# Patient Record
Sex: Male | Born: 1975
Health system: Southern US, Community
[De-identification: ages and names within clinical notes are randomized; demographics above are authoritative.]

## PROBLEM LIST (undated history)

## (undated) DIAGNOSIS — M1990 Unspecified osteoarthritis, unspecified site: Secondary | ICD-10-CM

## (undated) DIAGNOSIS — E7800 Pure hypercholesterolemia, unspecified: Secondary | ICD-10-CM

## (undated) DIAGNOSIS — T7840XA Allergy, unspecified, initial encounter: Secondary | ICD-10-CM

## (undated) DIAGNOSIS — C6290 Malignant neoplasm of unspecified testis, unspecified whether descended or undescended: Secondary | ICD-10-CM

## (undated) DIAGNOSIS — T8859XA Other complications of anesthesia, initial encounter: Secondary | ICD-10-CM

## (undated) DIAGNOSIS — I10 Essential (primary) hypertension: Secondary | ICD-10-CM

## (undated) DIAGNOSIS — Z9289 Personal history of other medical treatment: Secondary | ICD-10-CM

## (undated) DIAGNOSIS — E669 Obesity, unspecified: Secondary | ICD-10-CM

## (undated) DIAGNOSIS — E663 Overweight: Secondary | ICD-10-CM

## (undated) DIAGNOSIS — Z809 Family history of malignant neoplasm, unspecified: Secondary | ICD-10-CM

## (undated) DIAGNOSIS — R0789 Other chest pain: Secondary | ICD-10-CM

## (undated) DIAGNOSIS — K219 Gastro-esophageal reflux disease without esophagitis: Secondary | ICD-10-CM

## (undated) DIAGNOSIS — M94262 Chondromalacia, left knee: Secondary | ICD-10-CM

## (undated) DIAGNOSIS — Z9889 Other specified postprocedural states: Secondary | ICD-10-CM

## (undated) DIAGNOSIS — Z8249 Family history of ischemic heart disease and other diseases of the circulatory system: Secondary | ICD-10-CM

## (undated) DIAGNOSIS — R112 Nausea with vomiting, unspecified: Secondary | ICD-10-CM

## (undated) DIAGNOSIS — T4145XA Adverse effect of unspecified anesthetic, initial encounter: Secondary | ICD-10-CM

## (undated) HISTORY — DX: Overweight: E66.3

## (undated) HISTORY — DX: Essential (primary) hypertension: I10

## (undated) HISTORY — DX: Family history of malignant neoplasm, unspecified: Z80.9

## (undated) HISTORY — DX: Personal history of other medical treatment: Z92.89

## (undated) HISTORY — DX: Other chest pain: R07.89

## (undated) HISTORY — PX: APPENDECTOMY: SHX54

## (undated) HISTORY — DX: Gastro-esophageal reflux disease without esophagitis: K21.9

## (undated) HISTORY — DX: Obesity, unspecified: E66.9

## (undated) HISTORY — DX: Allergy, unspecified, initial encounter: T78.40XA

## (undated) HISTORY — PX: COLONOSCOPY: SHX174

## (undated) HISTORY — DX: Family history of ischemic heart disease and other diseases of the circulatory system: Z82.49

## (undated) HISTORY — DX: Malignant neoplasm of unspecified testis, unspecified whether descended or undescended: C62.90

---

## 1999-09-04 ENCOUNTER — Encounter: Admission: RE | Admit: 1999-09-04 | Discharge: 1999-09-04 | Payer: Self-pay | Admitting: *Deleted

## 2001-04-27 ENCOUNTER — Encounter: Payer: Self-pay | Admitting: Emergency Medicine

## 2001-04-27 ENCOUNTER — Observation Stay (HOSPITAL_COMMUNITY): Admission: EM | Admit: 2001-04-27 | Discharge: 2001-04-28 | Payer: Self-pay | Admitting: Emergency Medicine

## 2005-02-02 DIAGNOSIS — C6290 Malignant neoplasm of unspecified testis, unspecified whether descended or undescended: Secondary | ICD-10-CM

## 2005-02-02 HISTORY — DX: Malignant neoplasm of unspecified testis, unspecified whether descended or undescended: C62.90

## 2005-02-07 ENCOUNTER — Observation Stay (HOSPITAL_COMMUNITY): Admission: RE | Admit: 2005-02-07 | Discharge: 2005-02-08 | Payer: Self-pay | Admitting: Urology

## 2005-02-07 ENCOUNTER — Encounter (INDEPENDENT_AMBULATORY_CARE_PROVIDER_SITE_OTHER): Payer: Self-pay | Admitting: Specialist

## 2005-02-07 HISTORY — PX: RADICAL ORCHIECTOMY: SHX2285

## 2005-02-19 ENCOUNTER — Ambulatory Visit: Payer: Self-pay | Admitting: Hematology & Oncology

## 2005-03-05 ENCOUNTER — Ambulatory Visit: Admission: RE | Admit: 2005-03-05 | Discharge: 2005-03-05 | Payer: Self-pay | Admitting: Hematology & Oncology

## 2005-04-09 ENCOUNTER — Ambulatory Visit: Payer: Self-pay | Admitting: Hematology & Oncology

## 2005-05-06 ENCOUNTER — Ambulatory Visit (HOSPITAL_COMMUNITY): Admission: RE | Admit: 2005-05-06 | Discharge: 2005-05-06 | Payer: Self-pay | Admitting: Hematology & Oncology

## 2005-08-14 ENCOUNTER — Ambulatory Visit (HOSPITAL_COMMUNITY): Admission: RE | Admit: 2005-08-14 | Discharge: 2005-08-14 | Payer: Self-pay | Admitting: Hematology & Oncology

## 2005-08-27 ENCOUNTER — Ambulatory Visit: Payer: Self-pay | Admitting: Hematology & Oncology

## 2005-10-31 ENCOUNTER — Ambulatory Visit (HOSPITAL_COMMUNITY): Admission: RE | Admit: 2005-10-31 | Discharge: 2005-10-31 | Payer: Self-pay | Admitting: Hematology & Oncology

## 2005-11-26 ENCOUNTER — Ambulatory Visit: Payer: Self-pay | Admitting: Hematology & Oncology

## 2005-11-27 LAB — CBC WITH DIFFERENTIAL/PLATELET
BASO%: 0.4 % (ref 0.0–2.0)
Basophils Absolute: 0 10*3/uL (ref 0.0–0.1)
EOS%: 1.6 % (ref 0.0–7.0)
HGB: 15.5 g/dL (ref 13.0–17.1)
MCH: 30.4 pg (ref 28.0–33.4)
MCHC: 34.1 g/dL (ref 32.0–35.9)
RDW: 13 % (ref 11.2–14.6)
lymph#: 1.4 10*3/uL (ref 0.9–3.3)

## 2005-11-29 LAB — LACTATE DEHYDROGENASE: LDH: 151 U/L (ref 94–250)

## 2006-02-14 ENCOUNTER — Emergency Department (HOSPITAL_COMMUNITY): Admission: EM | Admit: 2006-02-14 | Discharge: 2006-02-14 | Payer: Self-pay | Admitting: Emergency Medicine

## 2006-03-20 ENCOUNTER — Ambulatory Visit: Payer: Self-pay | Admitting: Hematology & Oncology

## 2006-03-24 ENCOUNTER — Ambulatory Visit (HOSPITAL_COMMUNITY): Admission: RE | Admit: 2006-03-24 | Discharge: 2006-03-24 | Payer: Self-pay | Admitting: Hematology & Oncology

## 2006-03-26 LAB — CBC WITH DIFFERENTIAL/PLATELET
BASO%: 0.4 % (ref 0.0–2.0)
Basophils Absolute: 0 10e3/uL (ref 0.0–0.1)
EOS%: 3.3 % (ref 0.0–7.0)
Eosinophils Absolute: 0.2 10e3/uL (ref 0.0–0.5)
HCT: 48.2 % (ref 38.7–49.9)
HGB: 16.6 g/dL (ref 13.0–17.1)
LYMPH%: 26.5 % (ref 14.0–48.0)
MCH: 30.6 pg (ref 28.0–33.4)
MCHC: 34.4 g/dL (ref 32.0–35.9)
MCV: 89 fL (ref 81.6–98.0)
MONO#: 0.6 10e3/uL (ref 0.1–0.9)
MONO%: 9.7 % (ref 0.0–13.0)
NEUT#: 3.5 10e3/uL (ref 1.5–6.5)
NEUT%: 60.1 % (ref 40.0–75.0)
Platelets: 397 10e3/uL (ref 145–400)
RBC: 5.41 10e6/uL (ref 4.20–5.71)
RDW: 13 % (ref 11.2–14.6)
WBC: 5.9 10e3/uL (ref 4.0–10.0)
lymph#: 1.6 10e3/uL (ref 0.9–3.3)

## 2006-03-28 LAB — COMPREHENSIVE METABOLIC PANEL
ALT: 28 U/L (ref 0–40)
Albumin: 4.6 g/dL (ref 3.5–5.2)
Alkaline Phosphatase: 110 U/L (ref 39–117)
CO2: 25 mEq/L (ref 19–32)
Glucose, Bld: 93 mg/dL (ref 70–99)
Potassium: 4.5 mEq/L (ref 3.5–5.3)
Sodium: 141 mEq/L (ref 135–145)
Total Bilirubin: 0.6 mg/dL (ref 0.3–1.2)
Total Protein: 7.3 g/dL (ref 6.0–8.3)

## 2006-03-28 LAB — TESTOSTERONE: Testosterone: 523.82 ng/dL (ref 350–890)

## 2006-03-28 LAB — TSH: TSH: 0.831 u[IU]/mL (ref 0.350–5.500)

## 2006-05-06 ENCOUNTER — Ambulatory Visit: Payer: Self-pay | Admitting: Family Medicine

## 2006-05-26 ENCOUNTER — Ambulatory Visit: Payer: Self-pay

## 2006-05-26 ENCOUNTER — Ambulatory Visit: Payer: Self-pay | Admitting: Family Medicine

## 2006-05-28 ENCOUNTER — Ambulatory Visit: Payer: Self-pay | Admitting: Family Medicine

## 2006-05-28 ENCOUNTER — Encounter: Admission: RE | Admit: 2006-05-28 | Discharge: 2006-05-28 | Payer: Self-pay | Admitting: Family Medicine

## 2006-06-12 ENCOUNTER — Ambulatory Visit: Payer: Self-pay | Admitting: Family Medicine

## 2006-06-16 ENCOUNTER — Ambulatory Visit: Payer: Self-pay | Admitting: Cardiovascular Disease

## 2006-06-20 ENCOUNTER — Ambulatory Visit: Payer: Self-pay

## 2006-07-09 ENCOUNTER — Ambulatory Visit (HOSPITAL_COMMUNITY): Admission: RE | Admit: 2006-07-09 | Discharge: 2006-07-09 | Payer: Self-pay | Admitting: Hematology & Oncology

## 2006-07-18 ENCOUNTER — Ambulatory Visit: Payer: Self-pay | Admitting: Hematology & Oncology

## 2006-07-23 LAB — CBC WITH DIFFERENTIAL/PLATELET
BASO%: 0.4 % (ref 0.0–2.0)
Eosinophils Absolute: 0.2 10*3/uL (ref 0.0–0.5)
LYMPH%: 27.4 % (ref 14.0–48.0)
MCHC: 34.4 g/dL (ref 32.0–35.9)
MONO#: 0.6 10*3/uL (ref 0.1–0.9)
MONO%: 8.7 % (ref 0.0–13.0)
NEUT#: 4.4 10*3/uL (ref 1.5–6.5)
RBC: 5.24 10*6/uL (ref 4.20–5.71)
RDW: 13.2 % (ref 11.2–14.6)
WBC: 7.3 10*3/uL (ref 4.0–10.0)
lymph#: 2 10*3/uL (ref 0.9–3.3)

## 2006-07-25 LAB — COMPREHENSIVE METABOLIC PANEL
ALT: 36 U/L (ref 0–53)
Albumin: 4.6 g/dL (ref 3.5–5.2)
Alkaline Phosphatase: 128 U/L — ABNORMAL HIGH (ref 39–117)
CO2: 24 mEq/L (ref 19–32)
Glucose, Bld: 71 mg/dL (ref 70–99)
Potassium: 4.4 mEq/L (ref 3.5–5.3)
Sodium: 139 mEq/L (ref 135–145)
Total Bilirubin: 0.4 mg/dL (ref 0.3–1.2)
Total Protein: 7.1 g/dL (ref 6.0–8.3)

## 2006-07-25 LAB — AFP TUMOR MARKER: AFP-Tumor Marker: 1.8 ng/mL (ref 0.0–8.0)

## 2006-07-25 LAB — BETA HCG QUANT (REF LAB): Beta hCG, Tumor Marker: 0.7 m[IU]/mL (ref <5.0)

## 2006-08-06 ENCOUNTER — Ambulatory Visit: Payer: Self-pay | Admitting: Family Medicine

## 2006-09-30 ENCOUNTER — Ambulatory Visit: Payer: Self-pay | Admitting: Family Medicine

## 2006-09-30 ENCOUNTER — Encounter: Admission: RE | Admit: 2006-09-30 | Discharge: 2006-09-30 | Payer: Self-pay | Admitting: Physician Assistant

## 2006-11-12 ENCOUNTER — Ambulatory Visit (HOSPITAL_COMMUNITY): Admission: RE | Admit: 2006-11-12 | Discharge: 2006-11-12 | Payer: Self-pay | Admitting: Hematology & Oncology

## 2006-11-14 ENCOUNTER — Ambulatory Visit: Payer: Self-pay | Admitting: Hematology & Oncology

## 2006-12-04 LAB — CBC WITH DIFFERENTIAL/PLATELET
EOS%: 1.9 % (ref 0.0–7.0)
Eosinophils Absolute: 0.1 10*3/uL (ref 0.0–0.5)
LYMPH%: 27.1 % (ref 14.0–48.0)
MCH: 30.6 pg (ref 28.0–33.4)
MCV: 86.9 fL (ref 81.6–98.0)
MONO%: 7.8 % (ref 0.0–13.0)
NEUT#: 3.6 10*3/uL (ref 1.5–6.5)
Platelets: 301 10*3/uL (ref 145–400)
RBC: 5.34 10*6/uL (ref 4.20–5.71)

## 2006-12-07 LAB — COMPREHENSIVE METABOLIC PANEL
Alkaline Phosphatase: 98 U/L (ref 39–117)
BUN: 12 mg/dL (ref 6–23)
Glucose, Bld: 91 mg/dL (ref 70–99)
Sodium: 140 mEq/L (ref 135–145)
Total Bilirubin: 0.5 mg/dL (ref 0.3–1.2)
Total Protein: 6.9 g/dL (ref 6.0–8.3)

## 2006-12-07 LAB — AFP TUMOR MARKER: AFP-Tumor Marker: 1.6 ng/mL (ref 0.0–8.0)

## 2006-12-07 LAB — LIPID PANEL
Cholesterol: 202 mg/dL — ABNORMAL HIGH (ref 0–200)
Triglycerides: 124 mg/dL (ref <150)
VLDL: 25 mg/dL (ref 0–40)

## 2006-12-07 LAB — BETA HCG QUANT (REF LAB): Beta hCG, Tumor Marker: <0.5 m[IU]/mL (ref <5.0)

## 2006-12-19 ENCOUNTER — Ambulatory Visit: Payer: Self-pay | Admitting: Family Medicine

## 2007-04-02 ENCOUNTER — Ambulatory Visit: Payer: Self-pay

## 2007-05-28 ENCOUNTER — Ambulatory Visit (HOSPITAL_COMMUNITY): Admission: RE | Admit: 2007-05-28 | Discharge: 2007-05-28 | Payer: Self-pay | Admitting: Hematology & Oncology

## 2007-06-02 ENCOUNTER — Ambulatory Visit: Payer: Self-pay | Admitting: Hematology & Oncology

## 2007-06-04 LAB — CBC WITH DIFFERENTIAL/PLATELET
Basophils Absolute: 0 10*3/uL (ref 0.0–0.1)
EOS%: 1.9 % (ref 0.0–7.0)
Eosinophils Absolute: 0.1 10*3/uL (ref 0.0–0.5)
HCT: 46.3 % (ref 38.7–49.9)
HGB: 16.4 g/dL (ref 13.0–17.1)
LYMPH%: 27.6 % (ref 14.0–48.0)
MCH: 31.3 pg (ref 28.0–33.4)
MCV: 88.2 fL (ref 81.6–98.0)
MONO%: 7.8 % (ref 0.0–13.0)
NEUT#: 3.2 10*3/uL (ref 1.5–6.5)
NEUT%: 62.5 % (ref 40.0–75.0)
Platelets: 302 10*3/uL (ref 145–400)

## 2007-06-07 LAB — COMPREHENSIVE METABOLIC PANEL
AST: 18 U/L (ref 0–37)
Albumin: 4.4 g/dL (ref 3.5–5.2)
Alkaline Phosphatase: 113 U/L (ref 39–117)
BUN: 11 mg/dL (ref 6–23)
Creatinine, Ser: 1 mg/dL (ref 0.40–1.50)
Glucose, Bld: 85 mg/dL (ref 70–99)

## 2007-08-10 ENCOUNTER — Ambulatory Visit: Payer: Self-pay | Admitting: Family Medicine

## 2007-08-31 ENCOUNTER — Ambulatory Visit: Payer: Self-pay | Admitting: Family Medicine

## 2007-12-04 ENCOUNTER — Ambulatory Visit: Payer: Self-pay | Admitting: Family Medicine

## 2007-12-07 ENCOUNTER — Ambulatory Visit: Payer: Self-pay | Admitting: Family Medicine

## 2007-12-07 ENCOUNTER — Encounter: Admission: RE | Admit: 2007-12-07 | Discharge: 2007-12-07 | Payer: Self-pay | Admitting: Family Medicine

## 2007-12-17 ENCOUNTER — Ambulatory Visit (HOSPITAL_COMMUNITY): Admission: RE | Admit: 2007-12-17 | Discharge: 2007-12-17 | Payer: Self-pay | Admitting: Hematology & Oncology

## 2007-12-22 ENCOUNTER — Ambulatory Visit: Payer: Self-pay | Admitting: Hematology & Oncology

## 2008-02-17 ENCOUNTER — Ambulatory Visit: Payer: Self-pay | Admitting: Family Medicine

## 2008-02-25 ENCOUNTER — Ambulatory Visit: Payer: Self-pay | Admitting: Family Medicine

## 2008-06-17 ENCOUNTER — Ambulatory Visit (HOSPITAL_COMMUNITY): Admission: RE | Admit: 2008-06-17 | Discharge: 2008-06-17 | Payer: Self-pay | Admitting: Hematology & Oncology

## 2008-06-22 IMAGING — CR DG CHEST 1V PORT
1 series · 1 of 1 positions shown · non-contrast
Comparison: Prior scout view of a chest CT dated 10/31/05.

CLINICAL DATA: Chest pain and shortness of breath for 90 minutes.  
 PORTABLE CHEST - 1 VIEW 02/14/06:

[view not recorded]
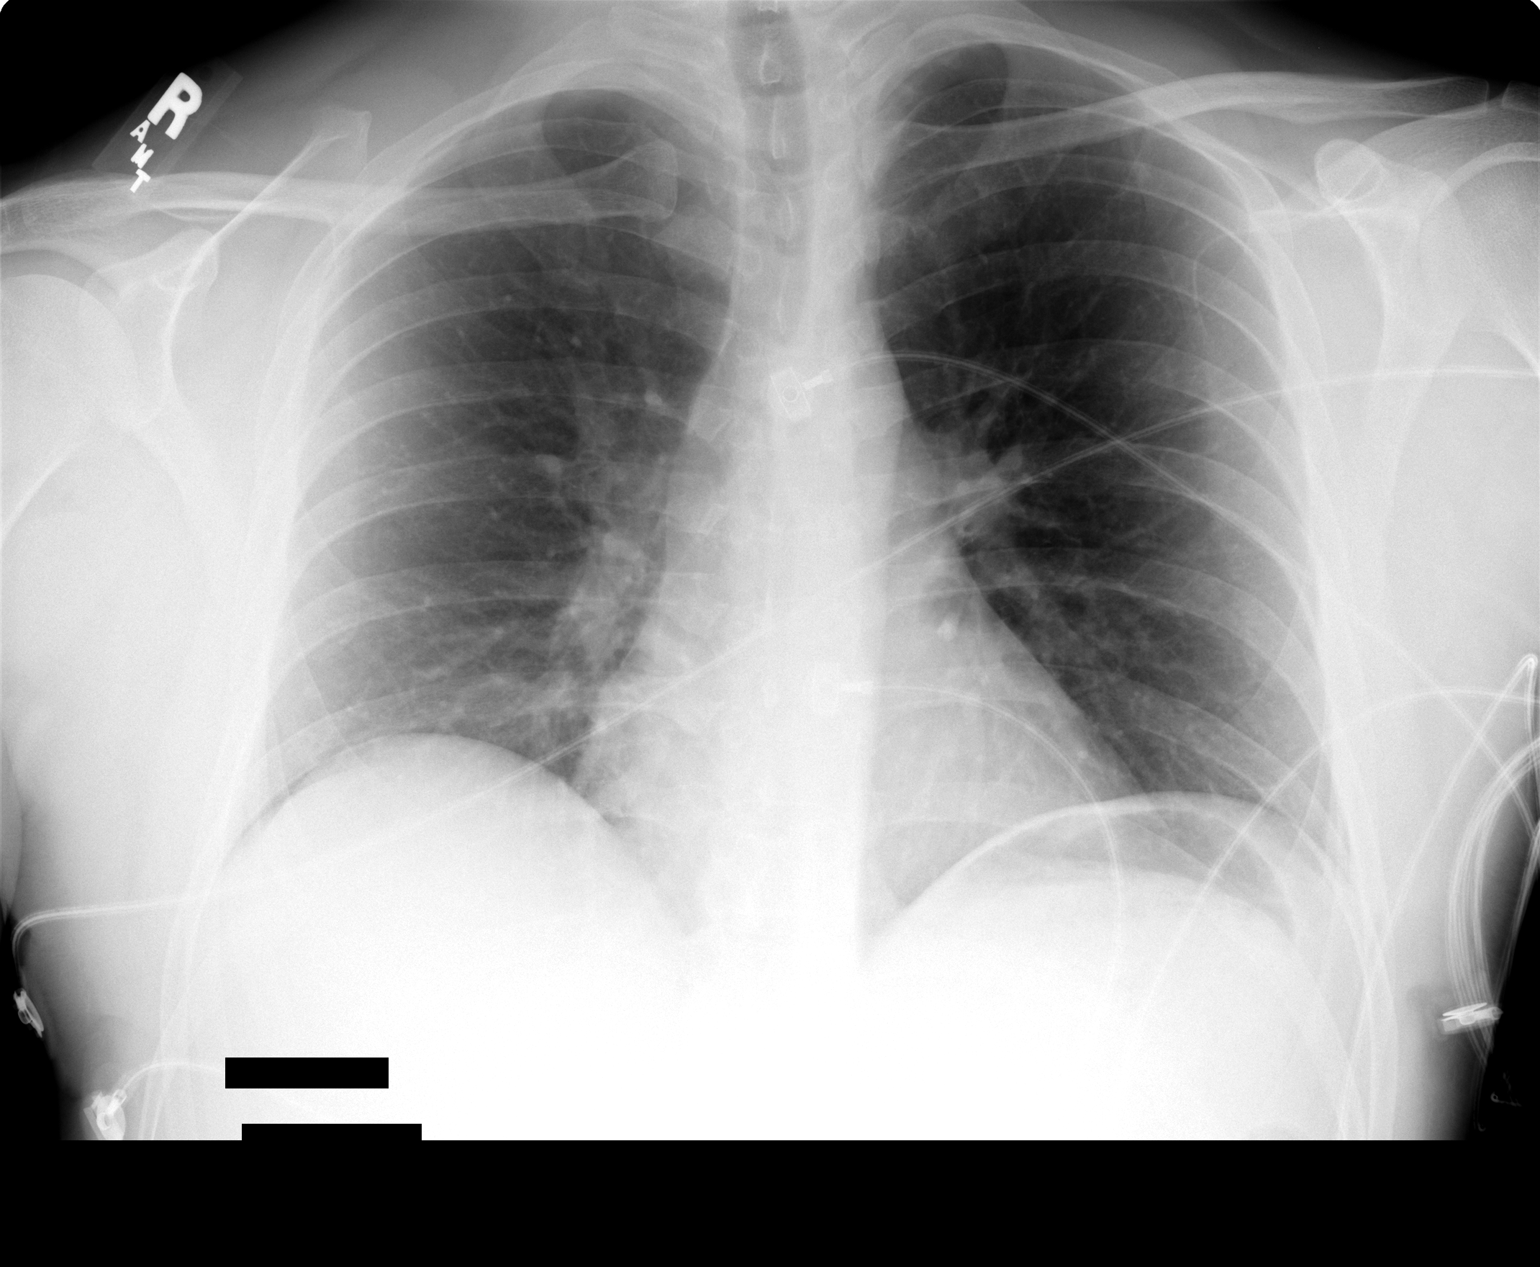

[1 of 1 positions shown; findings below may reference images not displayed]

FINDINGS: The heart size and mediastinal contours are within normal limits.  Both lungs are clear.
IMPRESSION: No acute findings.

## 2008-06-23 ENCOUNTER — Ambulatory Visit: Payer: Self-pay | Admitting: Hematology & Oncology

## 2008-06-24 LAB — CBC WITH DIFFERENTIAL (CANCER CENTER ONLY)
Eosinophils Absolute: 0.2 10*3/uL (ref 0.0–0.5)
MONO#: 0.6 10*3/uL (ref 0.1–0.9)
MONO%: 9.5 % (ref 0.0–13.0)
NEUT#: 4.3 10*3/uL (ref 1.5–6.5)
Platelets: 256 10*3/uL (ref 145–400)
RBC: 5.3 10*6/uL (ref 4.20–5.70)
WBC: 6.4 10*3/uL (ref 4.0–10.0)

## 2008-06-27 LAB — AFP TUMOR MARKER: AFP-Tumor Marker: 2.5 ng/mL (ref 0.0–8.0)

## 2008-06-27 LAB — COMPREHENSIVE METABOLIC PANEL
BUN: 10 mg/dL (ref 6–23)
CO2: 21 mEq/L (ref 19–32)
Calcium: 8.9 mg/dL (ref 8.4–10.5)
Chloride: 106 mEq/L (ref 96–112)
Creatinine, Ser: 1.03 mg/dL (ref 0.40–1.50)
Glucose, Bld: 91 mg/dL (ref 70–99)
Total Bilirubin: 0.6 mg/dL (ref 0.3–1.2)

## 2008-06-27 LAB — BETA HCG QUANT (REF LAB): Beta hCG, Tumor Marker: <0.5 m[IU]/mL (ref <5.0)

## 2008-06-27 LAB — LACTATE DEHYDROGENASE: LDH: 165 U/L (ref 94–250)

## 2008-08-01 ENCOUNTER — Encounter: Admission: RE | Admit: 2008-08-01 | Discharge: 2008-08-01 | Payer: Self-pay | Admitting: Family Medicine

## 2008-08-01 ENCOUNTER — Ambulatory Visit: Payer: Self-pay | Admitting: Family Medicine

## 2008-08-25 ENCOUNTER — Encounter: Admission: RE | Admit: 2008-08-25 | Discharge: 2008-08-25 | Payer: Self-pay | Admitting: Family Medicine

## 2008-08-25 ENCOUNTER — Ambulatory Visit: Payer: Self-pay | Admitting: Family Medicine

## 2008-11-11 ENCOUNTER — Ambulatory Visit (HOSPITAL_COMMUNITY): Admission: RE | Admit: 2008-11-11 | Discharge: 2008-11-12 | Payer: Self-pay | Admitting: Neurosurgery

## 2008-11-11 HISTORY — PX: ANTERIOR CERVICAL DECOMP/DISCECTOMY FUSION: SHX1161

## 2008-12-12 ENCOUNTER — Ambulatory Visit (HOSPITAL_BASED_OUTPATIENT_CLINIC_OR_DEPARTMENT_OTHER): Admission: RE | Admit: 2008-12-12 | Discharge: 2008-12-12 | Payer: Self-pay | Admitting: Hematology & Oncology

## 2008-12-12 ENCOUNTER — Ambulatory Visit: Payer: Self-pay | Admitting: Diagnostic Radiology

## 2008-12-15 ENCOUNTER — Ambulatory Visit: Payer: Self-pay | Admitting: Hematology & Oncology

## 2008-12-16 LAB — CBC WITH DIFFERENTIAL (CANCER CENTER ONLY)
EOS%: 5 % (ref 0.0–7.0)
MCH: 30.6 pg (ref 28.0–33.4)
MCHC: 34.3 g/dL (ref 32.0–35.9)
MONO%: 6.8 % (ref 0.0–13.0)
NEUT#: 2.7 10*3/uL (ref 1.5–6.5)
Platelets: 318 10*3/uL (ref 145–400)
RBC: 4.98 10*6/uL (ref 4.20–5.70)

## 2008-12-19 LAB — COMPREHENSIVE METABOLIC PANEL
ALT: 43 U/L (ref 0–53)
AST: 24 U/L (ref 0–37)
Albumin: 4 g/dL (ref 3.5–5.2)
Alkaline Phosphatase: 111 U/L (ref 39–117)
Glucose, Bld: 95 mg/dL (ref 70–99)
Potassium: 4.2 mEq/L (ref 3.5–5.3)
Sodium: 140 mEq/L (ref 135–145)
Total Protein: 6.9 g/dL (ref 6.0–8.3)

## 2008-12-19 LAB — BETA HCG QUANT (REF LAB): Beta hCG, Tumor Marker: <0.5 m[IU]/mL (ref <5.0)

## 2009-01-04 ENCOUNTER — Encounter: Admission: RE | Admit: 2009-01-04 | Discharge: 2009-01-04 | Payer: Self-pay | Admitting: Neurosurgery

## 2009-01-31 ENCOUNTER — Ambulatory Visit: Payer: Self-pay | Admitting: Family Medicine

## 2009-01-31 ENCOUNTER — Encounter: Admission: RE | Admit: 2009-01-31 | Discharge: 2009-01-31 | Payer: Self-pay | Admitting: Family Medicine

## 2009-06-07 ENCOUNTER — Ambulatory Visit: Payer: Self-pay | Admitting: Family Medicine

## 2009-06-15 ENCOUNTER — Ambulatory Visit: Payer: Self-pay | Admitting: Hematology & Oncology

## 2009-06-16 LAB — CBC WITH DIFFERENTIAL (CANCER CENTER ONLY)
BASO#: 0 10*3/uL (ref 0.0–0.2)
EOS%: 3.7 % (ref 0.0–7.0)
HGB: 15.4 g/dL (ref 13.0–17.1)
MCH: 30.7 pg (ref 28.0–33.4)
MCHC: 34.9 g/dL (ref 32.0–35.9)
MONO%: 7.1 % (ref 0.0–13.0)
NEUT#: 3.4 10*3/uL (ref 1.5–6.5)
NEUT%: 59.5 % (ref 40.0–80.0)
RDW: 12 % (ref 10.5–14.6)

## 2009-06-18 LAB — COMPREHENSIVE METABOLIC PANEL
AST: 25 U/L (ref 0–37)
Albumin: 4.4 g/dL (ref 3.5–5.2)
Alkaline Phosphatase: 95 U/L (ref 39–117)
BUN: 9 mg/dL (ref 6–23)
Potassium: 3.9 mEq/L (ref 3.5–5.3)
Sodium: 139 mEq/L (ref 135–145)
Total Bilirubin: 0.5 mg/dL (ref 0.3–1.2)

## 2009-06-18 LAB — BETA HCG QUANT (REF LAB): Beta hCG, Tumor Marker: 185.6 m[IU]/mL — ABNORMAL HIGH (ref <5.0)

## 2009-06-21 ENCOUNTER — Ambulatory Visit (HOSPITAL_BASED_OUTPATIENT_CLINIC_OR_DEPARTMENT_OTHER): Admission: RE | Admit: 2009-06-21 | Discharge: 2009-06-21 | Payer: Self-pay | Admitting: Hematology & Oncology

## 2009-06-21 ENCOUNTER — Ambulatory Visit: Payer: Self-pay | Admitting: Diagnostic Radiology

## 2009-06-27 ENCOUNTER — Ambulatory Visit: Payer: Self-pay | Admitting: Diagnostic Radiology

## 2009-06-27 ENCOUNTER — Ambulatory Visit (HOSPITAL_BASED_OUTPATIENT_CLINIC_OR_DEPARTMENT_OTHER): Admission: RE | Admit: 2009-06-27 | Discharge: 2009-06-27 | Payer: Self-pay | Admitting: Hematology & Oncology

## 2009-06-30 ENCOUNTER — Ambulatory Visit (HOSPITAL_COMMUNITY): Admission: RE | Admit: 2009-06-30 | Discharge: 2009-06-30 | Payer: Self-pay | Admitting: Hematology & Oncology

## 2009-06-30 LAB — BETA HCG QUANT (REF LAB): Beta hCG, Tumor Marker: <0.5 m[IU]/mL (ref <5.0)

## 2009-08-08 ENCOUNTER — Ambulatory Visit: Payer: Self-pay | Admitting: Hematology & Oncology

## 2009-09-13 ENCOUNTER — Encounter: Admission: RE | Admit: 2009-09-13 | Discharge: 2009-09-13 | Payer: Self-pay | Admitting: Family Medicine

## 2009-09-13 ENCOUNTER — Ambulatory Visit: Payer: Self-pay | Admitting: Family Medicine

## 2009-12-14 ENCOUNTER — Ambulatory Visit: Payer: Self-pay | Admitting: Hematology & Oncology

## 2009-12-15 LAB — CBC WITH DIFFERENTIAL (CANCER CENTER ONLY)
Eosinophils Absolute: 0.3 10*3/uL (ref 0.0–0.5)
LYMPH#: 1.7 10*3/uL (ref 0.9–3.3)
MCV: 90 fL (ref 82–98)
MONO#: 0.5 10*3/uL (ref 0.1–0.9)
NEUT#: 3.6 10*3/uL (ref 1.5–6.5)
Platelets: 286 10*3/uL (ref 145–400)
RBC: 5.06 10*6/uL (ref 4.20–5.70)
WBC: 6.2 10*3/uL (ref 4.0–10.0)

## 2009-12-17 LAB — COMPREHENSIVE METABOLIC PANEL
Albumin: 4.3 g/dL (ref 3.5–5.2)
CO2: 22 mEq/L (ref 19–32)
Chloride: 106 mEq/L (ref 96–112)
Glucose, Bld: 97 mg/dL (ref 70–99)
Potassium: 4.1 mEq/L (ref 3.5–5.3)
Sodium: 140 mEq/L (ref 135–145)
Total Protein: 6.9 g/dL (ref 6.0–8.3)

## 2009-12-17 LAB — LACTATE DEHYDROGENASE: LDH: 186 U/L (ref 94–250)

## 2009-12-17 LAB — AFP TUMOR MARKER: AFP-Tumor Marker: 2.4 ng/mL (ref 0.0–8.0)

## 2010-01-18 ENCOUNTER — Ambulatory Visit: Payer: Self-pay | Admitting: Hematology & Oncology

## 2010-02-22 ENCOUNTER — Ambulatory Visit: Payer: Self-pay | Admitting: Hematology & Oncology

## 2010-04-02 ENCOUNTER — Ambulatory Visit: Payer: Self-pay | Admitting: Family Medicine

## 2010-04-09 ENCOUNTER — Emergency Department (HOSPITAL_COMMUNITY): Admission: EM | Admit: 2010-04-09 | Discharge: 2010-04-09 | Payer: Self-pay | Admitting: Emergency Medicine

## 2010-04-12 ENCOUNTER — Ambulatory Visit: Payer: Self-pay | Admitting: Physician Assistant

## 2010-04-12 ENCOUNTER — Observation Stay (HOSPITAL_COMMUNITY): Admission: EM | Admit: 2010-04-12 | Discharge: 2010-04-13 | Payer: Self-pay | Admitting: Emergency Medicine

## 2010-04-12 ENCOUNTER — Ambulatory Visit: Payer: Self-pay | Admitting: Cardiology

## 2010-04-13 HISTORY — PX: CARDIAC CATHETERIZATION: SHX172

## 2010-04-19 ENCOUNTER — Telehealth (INDEPENDENT_AMBULATORY_CARE_PROVIDER_SITE_OTHER): Payer: Self-pay | Admitting: *Deleted

## 2010-04-23 ENCOUNTER — Telehealth: Payer: Self-pay | Admitting: Cardiovascular Disease

## 2010-04-30 ENCOUNTER — Telehealth: Payer: Self-pay | Admitting: Cardiovascular Disease

## 2010-05-03 ENCOUNTER — Ambulatory Visit: Payer: Self-pay | Admitting: Family Medicine

## 2010-05-09 ENCOUNTER — Ambulatory Visit (HOSPITAL_BASED_OUTPATIENT_CLINIC_OR_DEPARTMENT_OTHER): Payer: BC Managed Care – PPO | Admitting: Hematology & Oncology

## 2010-05-10 ENCOUNTER — Telehealth: Payer: Self-pay | Admitting: Cardiovascular Disease

## 2010-05-10 LAB — CBC WITH DIFFERENTIAL (CANCER CENTER ONLY)
BASO%: 0.6 % (ref 0.0–2.0)
HCT: 47 % (ref 38.7–49.9)
LYMPH%: 33.7 % (ref 14.0–48.0)
MCHC: 33.3 g/dL (ref 32.0–35.9)
MCV: 91 fL (ref 82–98)
MONO#: 0.5 10*3/uL (ref 0.1–0.9)
NEUT%: 54.8 % (ref 40.0–80.0)
RDW: 11.4 % (ref 10.5–14.6)

## 2010-05-12 LAB — COMPREHENSIVE METABOLIC PANEL
ALT: 56 U/L — ABNORMAL HIGH (ref 0–53)
CO2: 23 mEq/L (ref 19–32)
Creatinine, Ser: 0.94 mg/dL (ref 0.40–1.50)
Total Bilirubin: 0.4 mg/dL (ref 0.3–1.2)

## 2010-05-12 LAB — TESTOSTERONE: Testosterone: 279.19 ng/dL — ABNORMAL LOW (ref 350–890)

## 2010-05-12 LAB — AFP TUMOR MARKER: AFP-Tumor Marker: 2.8 ng/mL (ref 0.0–8.0)

## 2010-05-12 LAB — BETA HCG QUANT (REF LAB): Beta hCG, Tumor Marker: <0.5 m[IU]/mL (ref <5.0)

## 2010-08-26 ENCOUNTER — Encounter: Payer: Self-pay | Admitting: Hematology & Oncology

## 2010-08-26 ENCOUNTER — Encounter: Payer: Self-pay | Admitting: Family Medicine

## 2010-08-30 ENCOUNTER — Ambulatory Visit: Admit: 2010-08-30 | Payer: Self-pay | Admitting: Family Medicine

## 2010-08-30 ENCOUNTER — Ambulatory Visit
Admission: RE | Admit: 2010-08-30 | Discharge: 2010-08-30 | Payer: Self-pay | Source: Home / Self Care | Attending: Family Medicine | Admitting: Family Medicine

## 2010-09-04 NOTE — Progress Notes (Signed)
  [employer] papers recieved sent to [facility] for completion [facility]  April 19, 2010 11:44 AM

## 2010-09-04 NOTE — Progress Notes (Signed)
Summary: Calling regarding disability forms  Phone Note Call from Patient Call back at [phone]   Caller: Patient Summary of Call: Pt calling regarding disability forms Initial call taken by: Judie Grieve,  May 10, 2010 10:34 AM  Follow-up for Phone Call        Will get Dr. Clifton James to sign the forms and we will send these back to [employer]. Pt. is aware and [employer] will call pt. when forms are completed. Whitney Maeola Sarah RN  May 10, 2010 5:01 PM  Follow-up by: Whitney Maeola Sarah RN,  May 10, 2010 5:01 PM

## 2010-09-04 NOTE — Progress Notes (Signed)
Summary: re disability form  Phone Note Call from Patient   Caller: Spouse tabitha Reason for Call: Talk to Nurse Summary of Call: pt's wife calling re disability form-faxed over-wants to make sure they came in and if they are done-pls call [phone] Initial call taken by: Glynda Jaeger,  April 23, 2010 11:35 AM  Follow-up for Phone Call        Spoke with Tabitha. [employer] sending info to pt & forms should be finished within 7-10 days.  Whitney Maeola Sarah RN  April 24, 2010 9:22 AM

## 2010-09-04 NOTE — Progress Notes (Signed)
Summary: status of faxed send on 9/114  Phone Note Call from Patient Call back at Home Phone [phone] Call back at [phone]   Caller: Spouse- tabitha  Reason for Call: Talk to Nurse Summary of Call: Status of faxed send on 9/14.  Initial call taken by: Lorne Skeens,  April 30, 2010 8:35 AM  Follow-up for Phone Call        Spoke to pt. [employer] will mail the info to her for them to sign & we will call her as soon as we recieve it from [employer] for them to pick up. Whitney Maeola Sarah RN  April 30, 2010 1:41 PM

## 2010-10-18 LAB — DIFFERENTIAL
Basophils Absolute: 0 10*3/uL (ref 0.0–0.1)
Lymphocytes Relative: 30 % (ref 12–46)
Monocytes Absolute: 0.7 10*3/uL (ref 0.1–1.0)
Monocytes Relative: 9 % (ref 3–12)
Neutro Abs: 4.4 10*3/uL (ref 1.7–7.7)
Neutrophils Relative %: 57 % (ref 43–77)

## 2010-10-18 LAB — CBC
HCT: 48.1 % (ref 39.0–52.0)
Hemoglobin: 17.2 g/dL — ABNORMAL HIGH (ref 13.0–17.0)
MCHC: 34.2 g/dL (ref 30.0–36.0)
RDW: 12.3 % (ref 11.5–15.5)
RDW: 12.5 % (ref 11.5–15.5)
WBC: 7.6 10*3/uL (ref 4.0–10.5)

## 2010-10-18 LAB — CARDIAC PANEL(CRET KIN+CKTOT+MB+TROPI)
CK, MB: 1.1 ng/mL (ref 0.3–4.0)
Relative Index: 0.8 (ref 0.0–2.5)
Total CK: 120 U/L (ref 7–232)
Total CK: 130 U/L (ref 7–232)
Troponin I: 0.05 ng/mL (ref 0.00–0.06)

## 2010-10-18 LAB — BRAIN NATRIURETIC PEPTIDE: Pro B Natriuretic peptide (BNP): <30 pg/mL (ref 0.0–100.0)

## 2010-10-18 LAB — LIPID PANEL
Cholesterol: 183 mg/dL (ref 0–200)
HDL: 37 mg/dL — ABNORMAL LOW (ref >39)
Total CHOL/HDL Ratio: 4.9 RATIO

## 2010-10-18 LAB — POCT I-STAT, CHEM 8
BUN: 13 mg/dL (ref 6–23)
Creatinine, Ser: 0.9 mg/dL (ref 0.4–1.5)
Potassium: 3.9 mEq/L (ref 3.5–5.1)
Sodium: 140 mEq/L (ref 135–145)

## 2010-10-18 LAB — POCT CARDIAC MARKERS
CKMB, poc: <1 ng/mL — ABNORMAL LOW (ref 1.0–8.0)
CKMB, poc: <1 ng/mL — ABNORMAL LOW (ref 1.0–8.0)
Myoglobin, poc: 51.5 ng/mL (ref 12–200)
Troponin i, poc: <0.05 ng/mL (ref 0.00–0.09)

## 2010-10-18 LAB — COMPREHENSIVE METABOLIC PANEL
ALT: 59 U/L — ABNORMAL HIGH (ref 0–53)
AST: 36 U/L (ref 0–37)
Calcium: 8.5 mg/dL (ref 8.4–10.5)
GFR calc Af Amer: >60 mL/min (ref >60)
Glucose, Bld: 99 mg/dL (ref 70–99)
Sodium: 139 mEq/L (ref 135–145)
Total Protein: 6.3 g/dL (ref 6.0–8.3)

## 2010-10-18 LAB — CK TOTAL AND CKMB (NOT AT ARMC): Total CK: 156 U/L (ref 7–232)

## 2010-10-18 LAB — HEPARIN LEVEL (UNFRACTIONATED): Heparin Unfractionated: 0.58 IU/mL (ref 0.30–0.70)

## 2010-10-18 LAB — PROTIME-INR: INR: 0.92 (ref 0.00–1.49)

## 2010-11-08 ENCOUNTER — Encounter (HOSPITAL_BASED_OUTPATIENT_CLINIC_OR_DEPARTMENT_OTHER): Payer: BC Managed Care – PPO | Admitting: Hematology & Oncology

## 2010-11-08 DIAGNOSIS — C6290 Malignant neoplasm of unspecified testis, unspecified whether descended or undescended: Secondary | ICD-10-CM

## 2010-11-08 LAB — CBC WITH DIFFERENTIAL (CANCER CENTER ONLY)
BASO%: 0.3 % (ref 0.0–2.0)
Eosinophils Absolute: 0.2 10*3/uL (ref 0.0–0.5)
HCT: 42.3 % (ref 38.7–49.9)
LYMPH%: 30.9 % (ref 14.0–48.0)
MCV: 85 fL (ref 82–98)
MONO#: 0.6 10*3/uL (ref 0.1–0.9)
NEUT%: 56.9 % (ref 40.0–80.0)
RDW: 12.5 % (ref 11.1–15.7)
WBC: 6.3 10*3/uL (ref 4.0–10.0)

## 2010-11-10 LAB — COMPREHENSIVE METABOLIC PANEL
CO2: 24 mEq/L (ref 19–32)
Creatinine, Ser: 1.16 mg/dL (ref 0.40–1.50)
Glucose, Bld: 92 mg/dL (ref 70–99)
Total Bilirubin: 0.4 mg/dL (ref 0.3–1.2)

## 2010-11-10 LAB — AFP TUMOR MARKER: AFP-Tumor Marker: 1.5 ng/mL (ref 0.0–8.0)

## 2010-11-10 LAB — LACTATE DEHYDROGENASE: LDH: 159 U/L (ref 94–250)

## 2010-11-14 LAB — TYPE AND SCREEN: Antibody Screen: NEGATIVE

## 2010-11-14 LAB — CBC
RBC: 5.01 MIL/uL (ref 4.22–5.81)
WBC: 7.6 10*3/uL (ref 4.0–10.5)

## 2010-11-14 LAB — DIFFERENTIAL
Basophils Relative: 0 % (ref 0–1)
Lymphs Abs: 2.2 10*3/uL (ref 0.7–4.0)
Monocytes Relative: 8 % (ref 3–12)
Neutro Abs: 4.6 10*3/uL (ref 1.7–7.7)
Neutrophils Relative %: 60 % (ref 43–77)

## 2010-11-30 ENCOUNTER — Encounter (INDEPENDENT_AMBULATORY_CARE_PROVIDER_SITE_OTHER): Payer: BC Managed Care – PPO | Admitting: *Deleted

## 2010-11-30 ENCOUNTER — Ambulatory Visit: Payer: BC Managed Care – PPO | Admitting: Family Medicine

## 2010-11-30 ENCOUNTER — Ambulatory Visit (INDEPENDENT_AMBULATORY_CARE_PROVIDER_SITE_OTHER): Payer: BC Managed Care – PPO | Admitting: *Deleted

## 2010-11-30 DIAGNOSIS — Z0000 Encounter for general adult medical examination without abnormal findings: Secondary | ICD-10-CM

## 2010-11-30 DIAGNOSIS — L258 Unspecified contact dermatitis due to other agents: Secondary | ICD-10-CM

## 2010-11-30 DIAGNOSIS — E785 Hyperlipidemia, unspecified: Secondary | ICD-10-CM

## 2010-11-30 DIAGNOSIS — I10 Essential (primary) hypertension: Secondary | ICD-10-CM

## 2010-12-04 ENCOUNTER — Other Ambulatory Visit (INDEPENDENT_AMBULATORY_CARE_PROVIDER_SITE_OTHER): Payer: BC Managed Care – PPO

## 2010-12-04 DIAGNOSIS — Z0000 Encounter for general adult medical examination without abnormal findings: Secondary | ICD-10-CM

## 2010-12-18 NOTE — Op Note (Signed)
NAMECOLEY, KULIKOWSKI                 ACCOUNT NO.:  [id number]   MEDICAL RECORD NO.:  [id number]          PATIENT TYPE:  AMB   LOCATION:  SDS                          FACILITY:  [facility]   PHYSICIAN:  Henry A. Pool, M.D.    DATE OF BIRTH:  May 05, 1976   DATE OF PROCEDURE:  11/11/2008  DATE OF DISCHARGE:                               OPERATIVE REPORT   PREOPERATIVE DIAGNOSIS:  Right C5-C6 herniated nucleus pulposus with  radiculopathy.   POSTOPERATIVE DIAGNOSIS:  Right C5-C6 herniated nucleus pulposus with  radiculopathy.   PROCEDURE:  Right C5-C6 anterior cervical diskectomy and fusion with  allograft and plating.   SURGEON:  Kathaleen Maser. Pool, MD   ANESTHESIA:  General endotracheal.   INDICATIONS:  Mr. Graciela Husbands is a 35 year old male with history of neck and  right upper extremity pain, paresthesias and weakness with a right-sided  C6 radiculopathy failing conservative management.  Workup demonstrates  evidence of large right-sided C5-C6 disk herniation with compression of  the right-sided T6 nerve root.  The patient has failed conservative  management.  He presents now for C5-C6 anterior cervical diskectomy and  fusion with allograft and plating in hopes of improving his symptoms.   OPERATIVE NOTE:  The patient was taken to the operating room and placed  on the table in supine position.  After adequate level of anesthesia was  achieved, the patient was positioned supine with neck slightly extended  and held in place with halter traction.  The patient's anterior cervical  region was prepped and draped sterilely.  A #10 blade was used to make a  linear skin incision overlying the C5-6 interspace.  This was carried  down sharply to the platysma.  The platysma was then divided vertically  and dissection proceeded on the medial border of the sternocleidomastoid  muscle and carotid sheath.  Trachea and esophagus were mobilized and  tracked towards the left.  The prevertebral fascia was stripped  off the  anterior spinal column.  The longus colli muscle was elevated  bilaterally using electrocautery.  Deep self-retaining retractor was  placed.  Intraoperative fluoroscopy was used and C5-6 level was  confirmed.  Disk space was then incised with #15 blade in a rectangular  fashion.  Wide disk space clean-out was then achieved using pituitary  rongeurs, forward and backward Karlin curettes, Kerrison rongeurs, and  high-speed drill.  All elements of the disk removed down to the level of  the posterior annulus.  Microscope was then brought into the field and  used throughout the remainder of diskectomy.  The remaining aspects of  annulus and osteophytes were removed using high-speed drill down to the  level of the posterior longitudinal ligament.  The posterior  longitudinal ligament was then elevated and resected in piecemeal  fashion using Kerrison rongeurs.  The underlying thecal sac was  identified.  Wide central decompression was performed by undercutting  the bodies of C5-C6.  Decompression was then proceeded out at each  neural foramen.  Wide anterior foraminotomies were then performed along  the course of the exiting C6 nerve roots bilaterally.  At this point,  all elements of the disk herniation were completely resected.  There was  no evidence of injury to thecal sac or nerve roots.  The wound was  irrigated.  Gelfoam was placed topically for hemostasis and found to be  good.  A 6-mm Cornerstone allograft wedge was then packed into place and  recessed roughly 1 mm from the anterior cortical margin.  A 25-mm  Atlantis anterior cervical plate was then placed over the C5 and C6  levels.  This was then attached under fluoroscopic guidance using 13-mm  variable angle screws, two each at both levels.  All four screws were  given final tightening and found be solid within the bone.  The locking  screws were engaged in both levels.  Final images revealed good position  of bone  grafts, hardware at proper operative level with normal alignment  of spine.  Wound was then irrigated with antibiotic  solution.  Hemostasis was found to be good.  Wound was then closed in a  typical fashion.  Steri-Strips and sterile dressings were applied.  No  complications.  The patient tolerated the procedure well and he returned  to the recovery room postoperatively.           ______________________________  Kathaleen Maser Pool, M.D.     HAP/MEDQ  D:  11/11/2008  T:  11/13/2008  Job:  [id number]

## 2010-12-21 NOTE — Letter (Signed)
June 16, 2006    Elpidio Galea and Sharlot Gowda, M.D.  [address],  [place], [place] [address]   RE:  Scott Moss, Scott Moss  MRN:  [id number]  /  DOB:  August 18, 1975   Dear Selena Batten and Dr. Susann Givens,   Thank you for allowing me to evaluate Mr. Boomer this morning at [facility]. As you know, he is a very nice 35 year old gentleman  who developed pain and tenderness with some swelling of the veins in the  right lower leg approximately 3 weeks ago. He describes the sudden onset of  tenderness starting in the medial aspect of the ankle and traveling up to  the knee with swelling of the vein in that region and associated tenderness.  He continues to have some mild symptoms but overall his lower leg symptoms  are improved. He underwent two venous duplex studies, one of which was  negative for DVT and also negative for any thrombus seen in the saphenous  vein and tibial vein system. A second study performed October 22  demonstrated occlusive thrombus in the greater saphenous vein on the right  from the knee through the dorsum of the foot. The deep veins were easily  compressible throughout. The greater saphenous vein throughout the thigh  region was also normal.   He normally wears work boots and has changed to tennis shoes as his work  boots have been irritating that area. He does not have any claudication  symptoms or generalized swelling of his lower extremities. He has no history  of nonhealing ulcers or other leg problems.   A second complaint is that of intermittent chest discomfort in the left  chest going down the left arm. He describes this pain as something sitting  on my chest. It occurs suddenly and is not related to exertion. The pain  does not last for more than a few minutes. He does not have any associated  symptoms. 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 He has not had  syncope, light-headedness, orthopnea, PND or edema. He does have mild  chronic  dyspnea on exertion that is unchanged over time.   The patient has no other acute complaints at this time.   PAST MEDICAL HISTORY:  Pertinent for the following:  1. Testicular cancer status post surgery and chemotherapy approximately 1      year ago. The patient reports undergoing serial CT scanning with no      recurrence.  2. Hypertension recently diagnosed. He reports high blood pressure      readings in the past. He was recently started on diuretic therapy.  3. Remote tonsillectomy at age 59.   SOCIAL HISTORY:  The patient drives a tractor trailer. He is married with 2  children. He does not smoke cigarettes or use recreational drugs. He drinks  alcohol occasionally. He does not exercise regularly.   FAMILY HISTORY:  Most pertinent for his father who had coronary artery  disease at age 49. He died of cancer at age 80. His mother died of cancer at  age 34. He has 2 sisters who are alive and well at age 103 and 2.   REVIEW OF SYSTEMS:  A complete 12-point review of systems was performed.  There are no pertinent positives to report other than symptoms described  above.   PHYSICAL EXAMINATION:  GENERAL:  The patient is an alert and oriented,  healthy-appearing white male in no acute distress.  VITAL SIGNS:  Weight is 206 pounds.  Blood pressure is 147/93, heart rate 71,  respirations 12.  EYES:  Sclera anicteric, conjunctive pink.  ENT:  Moist oral mucosa with a clear oropharynx.  NECK:  Normal carotid upstrokes without bruits. Jugular venous pressure is  normal. There is no adenopathy.  LUNGS:  Clear to auscultation bilaterally.  CARDIOVASCULAR:  The apex is discreet, nondisplaced. The heart is a regular  rate and rhythm without murmurs or gallops.  ABDOMEN:  Soft, nontender, no organomegaly.  EXTREMITIES:  There is no cyanosis, clubbing or edema. Peripheral pulses are  2+ and equal throughout. The right lower extremity has some firmness in the  area of the saphenous vein from  just posterior to the medial malleolus to  the mid calf. There is no tenderness associated. There is no redness or  warmth. The left lower extremity exam is completely normal.  NEUROLOGIC:  Grossly intact with 5/5 strength in the arms and legs  bilaterally.   Electrocardiogram demonstrates normal sinus rhythm with a nonspecific T wave  abnormality and is otherwise normal.   ASSESSMENT:  1. Mr. Rosser presents with superficial thrombophlebitis. His clinical      history and lower extremity ultrasound study are consistent with this      diagnosis. Advised him to continue to wear tennis shoes as opposed to      work boots. He should elevate his right leg when possible and use warm      compresses on a p.r.n. basis. He can take nonsteroidals on a p.r.n.      basis as well. If he develops recurrent problems, we can address that      in the future with thrombectomy if necessary but he will likely respond      well to conservative therapy as it appears his symptoms have already      improved.  2. Chest pain. In the setting of the patient's strong family history of      coronary artery disease with his father being diagnosed with coronary      artery disease in his mid 48s, I would like Mr. Dunne to undergo an      exercise Myoview stress test, as he has chest pain, for further      evaluation. In the meantime, I advised him to begin an 81 mg aspirin      daily. He will need aggressive risk factor modification due to his      strong family history.  3. Hypertension. Recently started on diuretic therapy. Will leave      treatment at your discretion.  4. Lipid status. He reports having blood work recently done at your      office. I would recommend very aggressive risk factor control and      consideration of statin therapy if his LDL is greater than 100 due to      is strong family history.  I will followup with Mr. Bechard by telephone after the results of this stress  test. I would be happy  to see him back anytime on a p.r.n. basis. Thanks  again for allowing me to evaluate Mr. Borgerding. Please feel free to contact me  at any time with questions regarding his care.    Sincerely,      Veverly Fells. Excell Seltzer, MD  Electronically Signed    MDC/MedQ  DD: 06/16/2006  DT: 06/16/2006  Job #: [id number]

## 2010-12-21 NOTE — Op Note (Signed)
Scott Moss, KULZER                 ACCOUNT NO.:  [id number]   MEDICAL RECORD NO.:  [id number]          PATIENT TYPE:  AMB   LOCATION:  DAY                          [facility]ACILITY[facility]   PHYSICIAN:  Jamison Neighbor, M.D.  DATE OF BIRTH:  21-Apr-1976   DATE OF PROCEDURE:  DATE OF DISCHARGE:                                 OPERATIVE REPORT   PREOPERATIVE DIAGNOSIS:  Left testicular mass.   POSTOPERATIVE DIAGNOSIS:  Left testicular mass.   PROCEDURE:  1.  Radical orchiectomy.  2.  Left testicular implant.   SURGEON:  Jamison Neighbor, M.D.   ASSISTANT:  ___________.   ANESTHESIA:  General endotracheal with local block.   PROCEDURE:  Patient was brought to OR #11, where he was identified by his  wrist bracelet and was administered preoperative antibiotics and general  anesthesia.  He was prepped and draped in the usual sterile fashion.  Next,  a 3 cm left inguinal incision was made.  We dissected down through the  dermis, subcutaneous fat, and Scarpa's fascia with Bovie cautery.  The  external oblique was identified.  We opened up approximately 3 cm in length  the fibers of the external oblique from the external ring to approximately 3  cm cephalad.  Then using a Kitner, the ilioinguinal nerve was identified and  swept into the inferior portion of the inguinal canal.  Next, the cord was  delivered into the field.  We then delivered the testis into the field after  ligating the cord proximally with a Penrose drain.  Following this, the  gubernaculum was divided.  Excellent hemostasis was obtained in the base of  the scrotum.  Following that, the cord structure was dissected as far  cephalad as possible.  We then ligated the cord in several packets using  individual free ties and stick ties consisting of 0 silk sutures.  Following  this, the remainder of the cord structures were delivered back into the  field, and the field was copiously irrigated with antibiotic solution.  Excellent  hemostasis was insured.  We next closed the external oblique with  interrupted figure-of-eight 2-0 silk sutures, being sure to not involve the  ilioinguinal nerve in the closure.  Following this, we implanted a large  left testicular prosthesis by suturing it into the dependent portion of the  left testicle with a single 2-0 silk tie.  We made sure not to button-hole  the skin.  Next, Scarpa fascia was closed with a running 2-0 chromic, and  the skin was closed with a  running 4-0 subcuticular Monocryl.  Dressings were applied.  The patient was  reversed from his anesthesia, which he tolerated well without complications.   Dr. Logan Bores was present for the entire case and participated in all aspects of  this case.       MT/MEDQ  D:  02/07/2005  T:  02/07/2005  Job:  [id number]

## 2011-03-12 ENCOUNTER — Encounter: Payer: Self-pay | Admitting: Family Medicine

## 2011-03-15 ENCOUNTER — Ambulatory Visit: Payer: BC Managed Care – PPO | Admitting: Medical

## 2011-04-18 ENCOUNTER — Other Ambulatory Visit: Payer: Self-pay | Admitting: Hematology & Oncology

## 2011-04-18 ENCOUNTER — Encounter (HOSPITAL_BASED_OUTPATIENT_CLINIC_OR_DEPARTMENT_OTHER): Payer: BC Managed Care – PPO | Admitting: Hematology & Oncology

## 2011-04-18 DIAGNOSIS — C6290 Malignant neoplasm of unspecified testis, unspecified whether descended or undescended: Secondary | ICD-10-CM

## 2011-04-18 LAB — CBC WITH DIFFERENTIAL (CANCER CENTER ONLY)
Eosinophils Absolute: 0.2 10*3/uL (ref 0.0–0.5)
HCT: 43.3 % (ref 38.7–49.9)
LYMPH%: 25.2 % (ref 14.0–48.0)
MCV: 85 fL (ref 82–98)
MONO#: 0.9 10*3/uL (ref 0.1–0.9)
Platelets: 297 10*3/uL (ref 145–400)
RBC: 5.09 10*6/uL (ref 4.20–5.70)
WBC: 9.2 10*3/uL (ref 4.0–10.0)

## 2011-04-22 LAB — COMPREHENSIVE METABOLIC PANEL
Albumin: 4.6 g/dL (ref 3.5–5.2)
CO2: 22 mEq/L (ref 19–32)
Calcium: 9.3 mg/dL (ref 8.4–10.5)
Chloride: 106 mEq/L (ref 96–112)
Glucose, Bld: 84 mg/dL (ref 70–99)
Sodium: 139 mEq/L (ref 135–145)
Total Bilirubin: 0.4 mg/dL (ref 0.3–1.2)
Total Protein: 7 g/dL (ref 6.0–8.3)

## 2011-04-22 LAB — BETA HCG QUANT (REF LAB): Beta hCG, Tumor Marker: <0.5 m[IU]/mL (ref <5.0)

## 2011-04-22 LAB — LACTATE DEHYDROGENASE: LDH: 176 U/L (ref 94–250)

## 2011-04-22 LAB — PSA: PSA: 0.75 ng/mL (ref <=4.00)

## 2011-04-22 LAB — TESTOSTERONE, FREE, TOTAL, SHBG
Sex Hormone Binding: 19 nmol/L (ref 13–71)
Testosterone: 347.29 ng/dL (ref 250–890)

## 2011-07-11 ENCOUNTER — Telehealth: Payer: Self-pay | Admitting: *Deleted

## 2011-07-11 NOTE — Telephone Encounter (Signed)
Pt moved 12-13 to 1-2

## 2011-07-18 ENCOUNTER — Ambulatory Visit: Payer: BC Managed Care – PPO | Admitting: Hematology & Oncology

## 2011-07-18 ENCOUNTER — Other Ambulatory Visit: Payer: BC Managed Care – PPO | Admitting: Lab

## 2011-08-07 ENCOUNTER — Other Ambulatory Visit: Payer: Self-pay | Admitting: Hematology & Oncology

## 2011-08-07 ENCOUNTER — Ambulatory Visit (HOSPITAL_BASED_OUTPATIENT_CLINIC_OR_DEPARTMENT_OTHER): Payer: BC Managed Care – PPO | Admitting: Hematology & Oncology

## 2011-08-07 ENCOUNTER — Other Ambulatory Visit (HOSPITAL_BASED_OUTPATIENT_CLINIC_OR_DEPARTMENT_OTHER): Payer: BC Managed Care – PPO | Admitting: Lab

## 2011-08-07 VITALS — BP 141/87 | HR 89 | Temp 98.1°F | Ht 69.0 in | Wt 213.0 lb

## 2011-08-07 DIAGNOSIS — B999 Unspecified infectious disease: Secondary | ICD-10-CM

## 2011-08-07 DIAGNOSIS — C6290 Malignant neoplasm of unspecified testis, unspecified whether descended or undescended: Secondary | ICD-10-CM

## 2011-08-07 DIAGNOSIS — R1032 Left lower quadrant pain: Secondary | ICD-10-CM

## 2011-08-07 LAB — LACTATE DEHYDROGENASE
LDH: 160 U/L (ref 94–250)
LDH: 160 U/L (ref 94–250)

## 2011-08-07 LAB — COMPREHENSIVE METABOLIC PANEL
ALT: 69 U/L — ABNORMAL HIGH (ref 0–53)
ALT: 69 U/L — ABNORMAL HIGH (ref 0–53)
ALT: 69 U/L — ABNORMAL HIGH (ref 0–53)
AST: 35 U/L (ref 0–37)
AST: 35 U/L (ref 0–37)
Albumin: 4.6 g/dL (ref 3.5–5.2)
Albumin: 4.6 g/dL (ref 3.5–5.2)
Alkaline Phosphatase: 85 U/L (ref 39–117)
BUN: 13 mg/dL (ref 6–23)
BUN: 13 mg/dL (ref 6–23)
CO2: 22 mEq/L (ref 19–32)
CO2: 22 mEq/L (ref 19–32)
Calcium: 8.9 mg/dL (ref 8.4–10.5)
Calcium: 8.9 mg/dL (ref 8.4–10.5)
Chloride: 104 mEq/L (ref 96–112)
Chloride: 104 mEq/L (ref 96–112)
Creatinine, Ser: 0.98 mg/dL (ref 0.50–1.35)
Potassium: 3.9 mEq/L (ref 3.5–5.3)
Potassium: 3.9 mEq/L (ref 3.5–5.3)
Sodium: 138 mEq/L (ref 135–145)
Total Protein: 6.8 g/dL (ref 6.0–8.3)

## 2011-08-07 LAB — CBC WITH DIFFERENTIAL (CANCER CENTER ONLY)
BASO#: 0 10*3/uL (ref 0.0–0.2)
HCT: 43.1 % (ref 38.7–49.9)
HGB: 15.2 g/dL (ref 13.0–17.1)
LYMPH#: 1.5 10*3/uL (ref 0.9–3.3)
LYMPH%: 23.5 % (ref 14.0–48.0)
MCHC: 35.3 g/dL (ref 32.0–35.9)
MCV: 86 fL (ref 82–98)
MONO#: 0.5 10*3/uL (ref 0.1–0.9)
NEUT%: 66.6 % (ref 40.0–80.0)

## 2011-08-07 LAB — AFP TUMOR MARKER: AFP-Tumor Marker: 2.6 ng/mL (ref 0.0–8.0)

## 2011-08-07 NOTE — Progress Notes (Signed)
This office note has been dictated.

## 2011-08-07 NOTE — Progress Notes (Signed)
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  CURRENT THERAPY:  Observation.  INTERIM HISTORY:  Scott Moss comes in for followup.  We saw back in September.  He has had weight loss since then.  He has not been doing too well over the past month or so.  He has lost about 20 pounds.  He is having a dry cough.  He was on some Tamiflu thinking that this may have been a viral infection.  He is having some abdominal pain in the left lower quadrant.  He has not noticed any problem with bowels or bladder. He has had no rashes.  When we last saw him, his tumor markers were normal.  His alpha- fetoprotein was 1.6.  Beta hCG was less than 0.5.  LDH was 176.  PHYSICAL EXAM:  General:  This is a well-developed, well-nourished white gentleman in no obvious distress.  Vital Signs:  Temperature 98.1, pulse 89, respiratory rate 16, blood pressure 141/87.  Weight is 213. Head/Neck:  Exam shows a normocephalic, atraumatic skull.  There are no ocular or oral lesions.  There are no palpable cervical or supraclavicular lymph nodes.  Lungs:  Clear bilaterally.  There may be some slight crackles at the base.  Cardiac:  Regular rate and rhythm with a normal S1, S2.  No murmurs, rubs or bruits.  Abdomen:  Soft with good bowel sounds.  There is no palpable abdominal mass.  There is no fluid wave.  There is no palpable hepatosplenomegaly.  There is some tenderness to palpation in the left lower quadrant.  Extremities:  No clubbing, cyanosis or edema.  Skin:  Exam shows no rashes.  LABORATORY STUDIES:  White cell count is 6.5, hemoglobin 15.2, hematocrit 43.1, platelet count 259.  IMPRESSION:  Scott Moss is a 36 year old gentleman with a history of stage I non-seminomatous germ cell tumor of the left testicle.  He did receive 2 cycles of BEP.  I think that he is going to need to have another CT scan done.  Late recurrence for testicular germ-cell tumors is definitely well  known.  I also gave him a prescription for Omnicef (600 mg p.o. daily x5 days) to try to help with any kind of sinus or upper respiratory infection.  I do want to see Scott Moss back in another couple months just so that we can follow up with all the symptoms.    ______________________________ Josph Macho, M.D. PRE/MEDQ  D:  08/07/2011  T:  08/07/2011  Job:  [id number]  ADDENDUM:  (-) CT scans. AFP is 2.6.  b-HCG is < 0.5.  LDH is 160         Testosterone is 221.

## 2011-08-10 LAB — COMPREHENSIVE METABOLIC PANEL
ALT: 69 U/L — ABNORMAL HIGH (ref 0–53)
Albumin: 4.6 g/dL (ref 3.5–5.2)
CO2: 22 mEq/L (ref 19–32)
Chloride: 104 mEq/L (ref 96–112)
Glucose, Bld: 113 mg/dL — ABNORMAL HIGH (ref 70–99)
Potassium: 3.9 mEq/L (ref 3.5–5.3)
Sodium: 138 mEq/L (ref 135–145)
Total Protein: 6.8 g/dL (ref 6.0–8.3)

## 2011-08-10 LAB — TESTOSTERONE: Testosterone: 221.5 ng/dL — ABNORMAL LOW (ref 250–890)

## 2011-08-10 LAB — LACTATE DEHYDROGENASE: LDH: 160 U/L (ref 94–250)

## 2011-08-15 ENCOUNTER — Ambulatory Visit (INDEPENDENT_AMBULATORY_CARE_PROVIDER_SITE_OTHER)
Admission: RE | Admit: 2011-08-15 | Discharge: 2011-08-15 | Disposition: A | Discharge status: Home / Self Care | Payer: BC Managed Care – PPO | Source: Ambulatory Visit | Attending: Hematology & Oncology | Admitting: Hematology & Oncology

## 2011-08-15 ENCOUNTER — Ambulatory Visit (HOSPITAL_BASED_OUTPATIENT_CLINIC_OR_DEPARTMENT_OTHER)
Admission: RE | Admit: 2011-08-15 | Discharge: 2011-08-15 | Disposition: A | Discharge status: Home / Self Care | Payer: BC Managed Care – PPO | Source: Ambulatory Visit | Attending: Hematology & Oncology | Admitting: Hematology & Oncology

## 2011-08-15 DIAGNOSIS — C6290 Malignant neoplasm of unspecified testis, unspecified whether descended or undescended: Secondary | ICD-10-CM

## 2011-08-15 DIAGNOSIS — R0602 Shortness of breath: Secondary | ICD-10-CM

## 2011-08-15 DIAGNOSIS — Z8547 Personal history of malignant neoplasm of testis: Secondary | ICD-10-CM

## 2011-08-15 DIAGNOSIS — R911 Solitary pulmonary nodule: Secondary | ICD-10-CM

## 2011-08-15 DIAGNOSIS — J984 Other disorders of lung: Secondary | ICD-10-CM | POA: Insufficient documentation

## 2011-08-15 DIAGNOSIS — R634 Abnormal weight loss: Secondary | ICD-10-CM | POA: Insufficient documentation

## 2011-08-15 DIAGNOSIS — K7689 Other specified diseases of liver: Secondary | ICD-10-CM | POA: Insufficient documentation

## 2011-08-15 MED ORDER — IOHEXOL 300 MG/ML  SOLN: 100.0000 mL | Freq: Once | INTRAMUSCULAR | Status: AC | PRN

## 2011-09-05 ENCOUNTER — Telehealth: Payer: Self-pay | Admitting: Hematology & Oncology

## 2011-09-05 NOTE — Telephone Encounter (Signed)
Pt called and cx 09/09/11 apt. He will call back to resch

## 2011-09-09 ENCOUNTER — Ambulatory Visit: Payer: BC Managed Care – PPO | Admitting: Hematology & Oncology

## 2011-09-09 ENCOUNTER — Other Ambulatory Visit: Payer: BC Managed Care – PPO | Admitting: Lab

## 2011-10-25 ENCOUNTER — Ambulatory Visit: Payer: BC Managed Care – PPO

## 2011-10-25 ENCOUNTER — Ambulatory Visit (INDEPENDENT_AMBULATORY_CARE_PROVIDER_SITE_OTHER): Payer: BC Managed Care – PPO | Admitting: Family Medicine

## 2011-10-25 VITALS — BP 143/87 | HR 80 | Temp 98.0°F | Resp 16 | Ht 69.0 in | Wt 209.4 lb

## 2011-10-25 DIAGNOSIS — M25522 Pain in left elbow: Secondary | ICD-10-CM

## 2011-10-25 DIAGNOSIS — M25429 Effusion, unspecified elbow: Secondary | ICD-10-CM

## 2011-10-25 DIAGNOSIS — M25422 Effusion, left elbow: Secondary | ICD-10-CM

## 2011-10-25 DIAGNOSIS — M25529 Pain in unspecified elbow: Secondary | ICD-10-CM

## 2011-10-25 MED ORDER — NAPROXEN 500 MG PO TABS: 500.0000 mg | ORAL_TABLET | Freq: Two times a day (BID) | ORAL | Status: DC

## 2011-10-25 NOTE — Progress Notes (Signed)
[facility]:  Office Visit  Chief Complaint:  Chief Complaint  Patient presents with  . Elbow Pain    Left - hit on a piece of metal x 2 dys    HPI: Scott Moss is a 36 y.o. male who complains of left elbow pain 2 days ago, hit it against a metal object when pulling elbow back form extension into  flexion and heard a pop. Start having pain and swelling. + weakness, no numbness/tingling. Tried Ibuproen and Ice with some relief. No prior trauma/injury/surgery to elbow. ROM is not affected.   Past Medical History  Diagnosis Date  . Cancer 02/2005    TESTICULAR  . Family history of early CAD 02/2005  . Migraine headache 05/2006  . Hyperlipidemia   . Hypertension    Past Surgical History  Procedure Date  . Orchietomy 02/2005  . Psf 11/2008  . Cervical spine surgery 2008  . Knee surgery    History   Social History  . Marital Status: Married    Spouse Name: N/A    Number of Children: N/A  . Years of Education: N/A   Social History Main Topics  . Smoking status: Never Smoker   . Smokeless tobacco: Not on file  . Alcohol Use: No  . Drug Use: No  . Sexually Active: Yes   Other Topics Concern  . Not on file   Social History Narrative  . No narrative on file   Family History  Problem Relation Age of Onset  . Cancer Mother   . Cancer Father    No Active Allergies Prior to Admission medications   Not on File     ROS: The patient denies fevers, chills, night sweats, unintentional weight loss, chest pain, palpitations, wheezing, dyspnea on exertion, nausea, vomiting, abdominal pain, dysuria, hematuria, melena, numbness, weakness, or tingling. + left elbow pain  All other systems have been reviewed and were otherwise negative with the exception of those mentioned in the HPI and as above.    PHYSICAL EXAM: Filed Vitals:   10/25/11 1411  BP: 143/87  Pulse: 80  Temp: 98 F (36.7 C)  Resp: 16   Filed Vitals:   10/25/11 1411  Height: 5\' 9"   (1.753 m)  Weight: 209 lb 6.4 oz (94.983 kg)   Body mass index is 30.92 kg/(m^2).  General: Alert, no acute distress HEENT:  Normocephalic, atraumatic, oropharynx patent.  Cardiovascular:  Regular rate and rhythm, no rubs murmurs or gallops.  No Carotid bruits, radial pulse intact. No pedal edema.  Respiratory: Clear to auscultation bilaterally.  No wheezes, rales, or rhonchi.  No cyanosis, no use of accessory musculature GI: No organomegaly, abdomen is soft and non-tender, positive bowel sounds.  No masses. Skin: No rashes. Neurologic: Facial musculature symmetric. Psychiatric: Patient is appropriate throughout our interaction. Lymphatic: No cervical lymphadenopathy Musculoskeletal: Gait intact. LEFT ELBOW: + minimal swelling at olecranon, no warmth, no redness Normal PROM, slight pain with AROM in all directions + radial pulse  5/5, sensation intact   LABS:    EKG/XRAY:   Primary read interpreted by [facility]. Conley Rolls at [facility]. NO fx/ dislocation  ASSESSMENT/PLAN: Encounter Diagnoses  Name Primary?  . Elbow pain, left Yes  . Swelling of joint, elbow, left    Elbow pain secondary to injury resulting in sprain/strain/minimal swelling. No fx/dislocation on xray.   Rx Naproxen 500 mg BID, sxs treatment. Patient declined sling. IF worsening sxs f/u prn.    Scott Ovens PHUONG, DO 10/25/2011  2:47 PM

## 2011-12-01 ENCOUNTER — Observation Stay (HOSPITAL_COMMUNITY)
Admission: EM | Admit: 2011-12-01 | Discharge: 2011-12-02 | Disposition: A | Discharge status: Home / Self Care | Payer: BC Managed Care – PPO | Attending: Internal Medicine | Admitting: Internal Medicine

## 2011-12-01 ENCOUNTER — Encounter (HOSPITAL_COMMUNITY): Payer: Self-pay | Admitting: *Deleted

## 2011-12-01 ENCOUNTER — Emergency Department (HOSPITAL_COMMUNITY): Payer: BC Managed Care – PPO

## 2011-12-01 DIAGNOSIS — R1032 Left lower quadrant pain: Principal | ICD-10-CM | POA: Insufficient documentation

## 2011-12-01 DIAGNOSIS — R110 Nausea: Secondary | ICD-10-CM | POA: Insufficient documentation

## 2011-12-01 DIAGNOSIS — Z8547 Personal history of malignant neoplasm of testis: Secondary | ICD-10-CM | POA: Insufficient documentation

## 2011-12-01 DIAGNOSIS — J984 Other disorders of lung: Secondary | ICD-10-CM | POA: Insufficient documentation

## 2011-12-01 DIAGNOSIS — E785 Hyperlipidemia, unspecified: Secondary | ICD-10-CM | POA: Insufficient documentation

## 2011-12-01 DIAGNOSIS — I10 Essential (primary) hypertension: Secondary | ICD-10-CM | POA: Insufficient documentation

## 2011-12-01 LAB — COMPREHENSIVE METABOLIC PANEL
ALT: 48 U/L (ref 0–53)
Albumin: 4 g/dL (ref 3.5–5.2)
Calcium: 9.3 mg/dL (ref 8.4–10.5)
GFR calc Af Amer: >90 mL/min (ref >90)
Glucose, Bld: 102 mg/dL — ABNORMAL HIGH (ref 70–99)
Potassium: 3.7 mEq/L (ref 3.5–5.1)
Sodium: 138 mEq/L (ref 135–145)
Total Protein: 7.2 g/dL (ref 6.0–8.3)

## 2011-12-01 LAB — DIFFERENTIAL
Basophils Relative: 0 % (ref 0–1)
Eosinophils Absolute: 0.3 10*3/uL (ref 0.0–0.7)
Eosinophils Relative: 4 % (ref 0–5)
Lymphs Abs: 2.7 10*3/uL (ref 0.7–4.0)
Neutrophils Relative %: 53 % (ref 43–77)

## 2011-12-01 LAB — CBC
MCH: 30.4 pg (ref 26.0–34.0)
MCHC: 34.7 g/dL (ref 30.0–36.0)
MCV: 87.5 fL (ref 78.0–100.0)
Platelets: 271 10*3/uL (ref 150–400)
RBC: 5.13 MIL/uL (ref 4.22–5.81)
RDW: 12.4 % (ref 11.5–15.5)

## 2011-12-01 MED ORDER — HYDROMORPHONE HCL PF 1 MG/ML IJ SOLN
1.0000 mg | Freq: Once | INTRAMUSCULAR | Status: AC
  Administered 2011-12-01: 1 mg via INTRAVENOUS
  Filled 2011-12-01: qty 1

## 2011-12-01 MED ORDER — IOHEXOL 300 MG/ML  SOLN
100.0000 mL | Freq: Once | INTRAMUSCULAR | Status: AC | PRN
  Administered 2011-12-01: 100 mL via INTRAVENOUS

## 2011-12-01 MED ORDER — HYDROMORPHONE HCL PF 1 MG/ML IJ SOLN
1.0000 mg | Freq: Once | INTRAMUSCULAR | Status: AC
  Administered 2011-12-02: 1 mg via INTRAVENOUS
  Filled 2011-12-01: qty 1

## 2011-12-01 MED ORDER — ONDANSETRON HCL 4 MG/2ML IJ SOLN
4.0000 mg | Freq: Once | INTRAMUSCULAR | Status: AC
Start: 2011-12-01 — End: 2011-12-01
  Administered 2011-12-01: 4 mg via INTRAVENOUS
  Filled 2011-12-01: qty 2

## 2011-12-01 MED ORDER — PANTOPRAZOLE SODIUM 40 MG IV SOLR
40.0000 mg | Freq: Once | INTRAVENOUS | Status: AC
  Administered 2011-12-01: 40 mg via INTRAVENOUS
  Filled 2011-12-01: qty 40

## 2011-12-01 NOTE — ED Notes (Signed)
Pt reports llq pain starting 2 days ago, lbm today and normal, no c/o dysuria

## 2011-12-01 NOTE — ED Provider Notes (Signed)
History   Scribed for [employer]. Colon Branch, MD, the patient was seen in [facility]. The chart was scribed by Gilman Schmidt. The patients care was started at 9:37 PM.   CSN: [id number]  Arrival date & time 12/01/11  2044   First MD Initiated Contact with Patient 12/01/11 2105      Chief Complaint  Patient presents with  . Abdominal Pain    (Consider location/radiation/quality/duration/timing/severity/associated sxs/prior treatment) HPI SATURNINO LIEW is a 36 y.o. male with a history of testicular cancer who presents to the Emergency Department complaining of sharp LLQ abdominal pain onset 2 days and worsening. Also notes swelling, lower back pain (both sides) and nausea. Reports normal BM. Pt has never had similar symptoms. Wife notes that pt six months ago pt has similar symptoms lasting one day. Never had a colonoscopy. Denies any family history of abdominal complications. Denies any fever, hematuria, chills, trouble urinating, or vomiting.    PCP: none Past Medical History  Diagnosis Date  . Cancer 02/2005    TESTICULAR  . Family history of early CAD 02/2005  . Migraine headache 05/2006  . Hyperlipidemia   . Hypertension     Past Surgical History  Procedure Date  . Orchietomy 02/2005  . Psf 11/2008  . Cervical spine surgery 2008  . Knee surgery     Family History  Problem Relation Age of Onset  . Cancer Mother   . Cancer Father     History  Substance Use Topics  . Smoking status: Never Smoker   . Smokeless tobacco: Not on file  . Alcohol Use: No   Works as a Engineer, drilling   Review of Systems  Constitutional: Negative for fever and chills.  Gastrointestinal: Positive for nausea and abdominal pain. Negative for vomiting.  Genitourinary: Negative for hematuria and difficulty urinating.  Musculoskeletal: Positive for back pain.  All other systems reviewed and are negative.    Allergies  Codeine  Home Medications   Current Outpatient Rx  Name Route  Sig Dispense Refill  . FEXOFENADINE-PSEUDOEPHED ER 60-120 MG PO TB12 Oral Take 1 tablet by mouth at bedtime.    [name] NAPROXEN 500 MG PO TABS Oral Take 1 tablet (500 mg total) by mouth 2 (two) times daily with a meal. 60 tablet 0    BP 151/95  Pulse 85  Temp(Src) 98.5 F (36.9 C) (Oral)  Resp 20  Ht 5\' 10"  (1.778 m)  Wt 211 lb (95.709 kg)  BMI 30.28 kg/m2  SpO2 98%  Physical Exam  Constitutional: He appears well-developed and well-nourished.  HENT:  Head: Normocephalic and atraumatic.  Eyes: Conjunctivae are normal. Pupils are equal, round, and reactive to light.  Neck: Neck supple. No tracheal deviation present. No thyromegaly present.  Cardiovascular: Normal rate and regular rhythm.  Exam reveals no gallop and no friction rub.   No murmur heard. Pulmonary/Chest: Effort normal and breath sounds normal.  Abdominal: Soft. Bowel sounds are normal. He exhibits no distension. There is no tenderness.       Focal LLQ discomfort with guarding but no rebound   Musculoskeletal: Normal range of motion. He exhibits no edema and no tenderness.  Neurological: He is alert. Coordination normal.  Skin: Skin is warm and dry. No rash noted.  Psychiatric: He has a normal mood and affect.    ED Course  Procedures (including critical care time)  Labs Reviewed - No data to display No results found.   No diagnosis found.  DIAGNOSTIC STUDIES: Oxygen Saturation  is 09% on room air, normal by my interpretation.    LABS Results for orders placed during the hospital encounter of 12/01/11  CBC      Component Value Range   WBC 7.6  4.0 - 10.5 (K/uL)   RBC 5.13  4.22 - 5.81 (MIL/uL)   Hemoglobin 15.6  13.0 - 17.0 (g/dL)   HCT 16.1  09.6 - 04.5 (%)   MCV 87.5  78.0 - 100.0 (fL)   MCH 30.4  26.0 - 34.0 (pg)   MCHC 34.7  30.0 - 36.0 (g/dL)   RDW 40.9  81.1 - 91.4 (%)   Platelets 271  150 - 400 (K/uL)  DIFFERENTIAL      Component Value Range   Neutrophils Relative 53  43 - 77 (%)   Neutro Abs  4.0  1.7 - 7.7 (K/uL)   Lymphocytes Relative 36  12 - 46 (%)   Lymphs Abs 2.7  0.7 - 4.0 (K/uL)   Monocytes Relative 7  3 - 12 (%)   Monocytes Absolute 0.5  0.1 - 1.0 (K/uL)   Eosinophils Relative 4  0 - 5 (%)   Eosinophils Absolute 0.3  0.0 - 0.7 (K/uL)   Basophils Relative 0  0 - 1 (%)   Basophils Absolute 0.0  0.0 - 0.1 (K/uL)  COMPREHENSIVE METABOLIC PANEL      Component Value Range   Sodium 138  135 - 145 (mEq/L)   Potassium 3.7  3.5 - 5.1 (mEq/L)   Chloride 102  96 - 112 (mEq/L)   CO2 27  19 - 32 (mEq/L)   Glucose, Bld 102 (*) 70 - 99 (mg/dL)   BUN 15  6 - 23 (mg/dL)   Creatinine, Ser 7.82  0.50 - 1.35 (mg/dL)   Calcium 9.3  8.4 - 95.6 (mg/dL)   Total Protein 7.2  6.0 - 8.3 (g/dL)   Albumin 4.0  3.5 - 5.2 (g/dL)   AST 26  0 - 37 (U/L)   ALT 48  0 - 53 (U/L)   Alkaline Phosphatase 88  39 - 117 (U/L)   Total Bilirubin 0.2 (*) 0.3 - 1.2 (mg/dL)   GFR calc non Af Amer >90  >90 (mL/min)   GFR calc Af Amer >90  >90 (mL/min)  URINALYSIS, ROUTINE W REFLEX MICROSCOPIC      Component Value Range   Color, Urine YELLOW  YELLOW    APPearance CLEAR  CLEAR    Specific Gravity, Urine 1.010  1.005 - 1.030    pH 7.5  5.0 - 8.0    Glucose, UA NEGATIVE  NEGATIVE (mg/dL)   Hgb urine dipstick NEGATIVE  NEGATIVE    Bilirubin Urine NEGATIVE  NEGATIVE    Ketones, ur NEGATIVE  NEGATIVE (mg/dL)   Protein, ur NEGATIVE  NEGATIVE (mg/dL)   Urobilinogen, UA 0.2  0.0 - 1.0 (mg/dL)   Nitrite NEGATIVE  NEGATIVE    Leukocytes, UA NEGATIVE  NEGATIVE    Ct Abdomen Pelvis W Contrast  12/01/2011  *RADIOLOGY REPORT*  Clinical Data: Left lower quadrant abdominal pain  CT ABDOMEN AND PELVIS WITH CONTRAST  Technique:  Multidetector CT imaging of the abdomen and pelvis was performed following the standard protocol during bolus administration of intravenous contrast.  Contrast: OMNIPAQUE IOHEXOL 300 MG/ML  SOLN  Comparison: 08/15/2011 CT  Findings: Mild dependent atelectasis.  Unchanged tiny lung base  nodules, the largest of which is a a 4 mm subpleural nodule posteriorly within the left lower lobe.  Normal heart size.  Hepatic steatosis.  Unremarkable biliary system, spleen, pancreas, adrenal glands, kidneys.  No hydronephrosis or hydroureter.  No bowel obstruction.  No CT evidence for colitis.  Appendix not identified.  No right lower quadrant inflammation.  No free intraperitoneal air or fluid.  No lymphadenopathy.  Normal caliber vasculature.  Thin-walled bladder.  Left orchiectomy with prosthesis.  Right iliac bone island.  No acute osseous abnormality.  IMPRESSION: Tiny lung base nodules, similar to the recent comparison. Metastatic disease again not excluded. Attention on non emergent chest CT follow-up.  Hepatic steatosis.  No findings to explain the patient's acute left lower quadrant pain.  Original Report Authenticated By: Waneta Martins, M.D.    COORDINATION OF CARE: 9:37pm:  - Patient evaluated by ED physician, Protonix, Dilaudid, Zofran, CT Abdomen, CBC, Diff, CMP ordered 2358 Continued pain. Reviewed lab and CT results. No explanation for LLQ pain. Will arrange admission. 2359  T/C to Dr. Onalee Hua, hospitalist case discussed, including:  HPI, pertinent PM/SHx, VS/PE, dx testing, ED course and treatment.  Agreeable to observation.  Requests to write temporary orders, med surg bed to team 1.      MDM  Patient presents with left lower quadrant pain that began 2 days ago and has gotten progressively worse. Today pain has been significantly or acute with what he felt was swelling to the left lower quadrant. Labs are unremarkable. His exam was suggestive of possible colitis. CT scan negative for colitis or any acute process that would explain the left lower quadrant pain. Hospitalist Dr. Onalee Hua has agreed to admit the patient for observation.Dx testing d/w pt and wife.  Questions answered.  Verb understanding, agreeable to admission for observation. The patient appears reasonably stabilized  for admission considering the current resources, flow, and capabilities available in the ED at this time, and I doubt any other [facility] requiring further screening and/or treatment in the ED prior to admission for observation.  MDM Reviewed: nursing note, vitals and previous chart Reviewed previous: CT scan and x-ray Interpretation: labs and CT scan            Nicoletta Dress. Colon Branch, MD 12/02/11 [phone]

## 2011-12-02 ENCOUNTER — Encounter (HOSPITAL_COMMUNITY): Payer: Self-pay | Admitting: *Deleted

## 2011-12-02 DIAGNOSIS — Z8547 Personal history of malignant neoplasm of testis: Secondary | ICD-10-CM

## 2011-12-02 DIAGNOSIS — R1032 Left lower quadrant pain: Secondary | ICD-10-CM | POA: Diagnosis present

## 2011-12-02 DIAGNOSIS — I10 Essential (primary) hypertension: Secondary | ICD-10-CM | POA: Diagnosis present

## 2011-12-02 LAB — URINALYSIS, ROUTINE W REFLEX MICROSCOPIC
Bilirubin Urine: NEGATIVE
Glucose, UA: NEGATIVE mg/dL
Leukocytes, UA: NEGATIVE
Nitrite: NEGATIVE
Specific Gravity, Urine: 1.01 (ref 1.005–1.030)
pH: 7.5 (ref 5.0–8.0)

## 2011-12-02 LAB — BASIC METABOLIC PANEL
CO2: 27 mEq/L (ref 19–32)
Calcium: 8.9 mg/dL (ref 8.4–10.5)
Creatinine, Ser: 1.03 mg/dL (ref 0.50–1.35)
GFR calc non Af Amer: >90 mL/min (ref >90)

## 2011-12-02 LAB — CBC
HCT: 44 % (ref 39.0–52.0)
Hemoglobin: 15 g/dL (ref 13.0–17.0)
RBC: 4.97 MIL/uL (ref 4.22–5.81)

## 2011-12-02 MED ORDER — PROMETHAZINE HCL 25 MG/ML IJ SOLN
12.5000 mg | INTRAMUSCULAR | Status: DC | PRN
  Administered 2011-12-02: 12.5 mg via INTRAVENOUS
  Filled 2011-12-02: qty 1

## 2011-12-02 MED ORDER — HYDROMORPHONE HCL PF 1 MG/ML IJ SOLN
0.5000 mg | INTRAMUSCULAR | Status: DC | PRN
  Administered 2011-12-02: 0.5 mg via INTRAVENOUS
  Filled 2011-12-02: qty 1

## 2011-12-02 MED ORDER — METRONIDAZOLE IN NACL 5-0.79 MG/ML-% IV SOLN
INTRAVENOUS | Status: AC
  Filled 2011-12-02: qty 100

## 2011-12-02 MED ORDER — METRONIDAZOLE IN NACL 5-0.79 MG/ML-% IV SOLN
500.0000 mg | Freq: Three times a day (TID) | INTRAVENOUS | Status: DC
  Administered 2011-12-02: 500 mg via INTRAVENOUS
  Filled 2011-12-02 (×5): qty 100

## 2011-12-02 MED ORDER — SODIUM CHLORIDE 0.9 % IV SOLN: INTRAVENOUS | Status: DC

## 2011-12-02 MED ORDER — GABAPENTIN 100 MG PO CAPS: 100.0000 mg | ORAL_CAPSULE | Freq: Two times a day (BID) | ORAL | Status: DC

## 2011-12-02 MED ORDER — SODIUM CHLORIDE 0.9 % IJ SOLN
INTRAMUSCULAR | Status: AC
  Filled 2011-12-02: qty 3

## 2011-12-02 MED ORDER — ONDANSETRON HCL 4 MG PO TABS: 4.0000 mg | ORAL_TABLET | Freq: Four times a day (QID) | ORAL | Status: DC | PRN

## 2011-12-02 MED ORDER — CIPROFLOXACIN IN D5W 400 MG/200ML IV SOLN
400.0000 mg | Freq: Two times a day (BID) | INTRAVENOUS | Status: DC
  Administered 2011-12-02: 400 mg via INTRAVENOUS
  Filled 2011-12-02 (×4): qty 200

## 2011-12-02 MED ORDER — OXYCODONE-ACETAMINOPHEN 5-325 MG PO TABS: 1.0000 | ORAL_TABLET | ORAL | Status: AC | PRN

## 2011-12-02 MED ORDER — HYDROMORPHONE HCL PF 1 MG/ML IJ SOLN
2.0000 mg | INTRAMUSCULAR | Status: DC | PRN
  Administered 2011-12-02: 2 mg via INTRAVENOUS
  Filled 2011-12-02: qty 2

## 2011-12-02 MED ORDER — PROMETHAZINE HCL 12.5 MG PO TABS: 12.5000 mg | ORAL_TABLET | Freq: Four times a day (QID) | ORAL | Status: DC | PRN

## 2011-12-02 MED ORDER — CIPROFLOXACIN IN D5W 400 MG/200ML IV SOLN
INTRAVENOUS | Status: AC
  Filled 2011-12-02: qty 200

## 2011-12-02 MED ORDER — POTASSIUM CHLORIDE IN NACL 20-0.9 MEQ/L-% IV SOLN
INTRAVENOUS | Status: DC
  Administered 2011-12-02: 04:00:00 via INTRAVENOUS

## 2011-12-02 MED ORDER — ONDANSETRON HCL 4 MG/2ML IJ SOLN
4.0000 mg | Freq: Four times a day (QID) | INTRAMUSCULAR | Status: DC | PRN
  Administered 2011-12-02: 4 mg via INTRAVENOUS
  Filled 2011-12-02: qty 2

## 2011-12-02 NOTE — Progress Notes (Signed)
UR Chart Review Completed  

## 2011-12-02 NOTE — ED Notes (Signed)
Pt going to [phone]

## 2011-12-02 NOTE — Plan of Care (Signed)
Problem: Phase I Progression Outcomes Goal: Pain controlled with appropriate interventions Outcome: Progressing Pt pain meds adjusted and phenrgan added  Goal: Initial discharge plan identified Pt plan to be d/c home with wife Goal: Other Phase I Outcomes/Goals Outcome: Completed/Met Date Met:  12/02/11 Surgical consult complete, no surgical intervention needed at this time

## 2011-12-02 NOTE — Consult Note (Signed)
Reason for Consult: Left lower quadrant abdominal pain Referring Physician: [facility]  Scott Moss is an 36 y.o. male.  HPI: Patient is a 36 year old white male who presents with a several day history of worsening left lower quadrant abdominal pain. He denies any trauma to the left groin region. He states that the pain has progressively worsened. He has point tenderness in the left groin region. The pain does not radiate down his leg or into the scrotum. He is status post a left orchiectomy through a left groin incision in 2006.  Past Medical History  Diagnosis Date  . Cancer 02/2005    TESTICULAR  . Family history of early CAD 02/2005  . Hyperlipidemia   . Hypertension     Past Surgical History  Procedure Date  . Orchietomy 02/2005  . Psf 11/2008  . Cervical spine surgery 2008  . Knee surgery     Family History  Problem Relation Age of Onset  . Cancer Mother   . Hypertension Mother   . Stroke Mother   . Migraines Mother   . Cancer Father   . Hypertension Father   . Heart attack Father   . Cancer Sister   . Hypertension Sister   . Migraines Sister     Social History:  reports that he has never smoked. He does not have any smokeless tobacco history on file. He reports that he drinks about 1.2 ounces of alcohol per week. He reports that he does not use illicit drugs.  Allergies:  Allergies  Allergen Reactions  . Codeine     Medications: I have reviewed the patient's current medications.  Results for orders placed during the hospital encounter of 12/01/11 (from the past 48 hour(s))  CBC     Status: Normal   Collection Time   12/01/11  9:51 PM      Component Value Range Comment   WBC 7.6  4.0 - 10.5 (K/uL)    RBC 5.13  4.22 - 5.81 (MIL/uL)    Hemoglobin 15.6  13.0 - 17.0 (g/dL)    HCT 40.9  81.1 - 91.4 (%)    MCV 87.5  78.0 - 100.0 (fL)    MCH 30.4  26.0 - 34.0 (pg)    MCHC 34.7  30.0 - 36.0 (g/dL)    RDW 78.2  95.6 - 21.3 (%)    Platelets 271  150 -  400 (K/uL)   DIFFERENTIAL     Status: Normal   Collection Time   12/01/11  9:51 PM      Component Value Range Comment   Neutrophils Relative 53  43 - 77 (%)    Neutro Abs 4.0  1.7 - 7.7 (K/uL)    Lymphocytes Relative 36  12 - 46 (%)    Lymphs Abs 2.7  0.7 - 4.0 (K/uL)    Monocytes Relative 7  3 - 12 (%)    Monocytes Absolute 0.5  0.1 - 1.0 (K/uL)    Eosinophils Relative 4  0 - 5 (%)    Eosinophils Absolute 0.3  0.0 - 0.7 (K/uL)    Basophils Relative 0  0 - 1 (%)    Basophils Absolute 0.0  0.0 - 0.1 (K/uL)   COMPREHENSIVE METABOLIC PANEL     Status: Abnormal   Collection Time   12/01/11  9:51 PM      Component Value Range Comment   Sodium 138  135 - 145 (mEq/L)    Potassium 3.7  3.5 - 5.1 (mEq/L)  Chloride 102  96 - 112 (mEq/L)    CO2 27  19 - 32 (mEq/L)    Glucose, Bld 102 (*) 70 - 99 (mg/dL)    BUN 15  6 - 23 (mg/dL)    Creatinine, Ser 1.61  0.50 - 1.35 (mg/dL)    Calcium 9.3  8.4 - 10.5 (mg/dL)    Total Protein 7.2  6.0 - 8.3 (g/dL)    Albumin 4.0  3.5 - 5.2 (g/dL)    AST 26  0 - 37 (U/L)    ALT 48  0 - 53 (U/L)    Alkaline Phosphatase 88  39 - 117 (U/L)    Total Bilirubin 0.2 (*) 0.3 - 1.2 (mg/dL)    GFR calc non Af Amer >90  >90 (mL/min)    GFR calc Af Amer >90  >90 (mL/min)   URINALYSIS, ROUTINE W REFLEX MICROSCOPIC     Status: Normal   Collection Time   12/02/11 12:18 AM      Component Value Range Comment   Color, Urine YELLOW  YELLOW     APPearance CLEAR  CLEAR     Specific Gravity, Urine 1.010  1.005 - 1.030     pH 7.5  5.0 - 8.0     Glucose, UA NEGATIVE  NEGATIVE (mg/dL)    Hgb urine dipstick NEGATIVE  NEGATIVE     Bilirubin Urine NEGATIVE  NEGATIVE     Ketones, ur NEGATIVE  NEGATIVE (mg/dL)    Protein, ur NEGATIVE  NEGATIVE (mg/dL)    Urobilinogen, UA 0.2  0.0 - 1.0 (mg/dL)    Nitrite NEGATIVE  NEGATIVE     Leukocytes, UA NEGATIVE  NEGATIVE  MICROSCOPIC NOT DONE ON URINES WITH NEGATIVE PROTEIN, BLOOD, LEUKOCYTES, NITRITE, OR GLUCOSE <1000 mg/dL.  CBC      Status: Normal   Collection Time   12/02/11  5:12 AM      Component Value Range Comment   WBC 9.2  4.0 - 10.5 (K/uL)    RBC 4.97  4.22 - 5.81 (MIL/uL)    Hemoglobin 15.0  13.0 - 17.0 (g/dL)    HCT 09.6  04.5 - 40.9 (%)    MCV 88.5  78.0 - 100.0 (fL)    MCH 30.2  26.0 - 34.0 (pg)    MCHC 34.1  30.0 - 36.0 (g/dL)    RDW 81.1  91.4 - 78.2 (%)    Platelets 265  150 - 400 (K/uL)   BASIC METABOLIC PANEL     Status: Abnormal   Collection Time   12/02/11  5:12 AM      Component Value Range Comment   Sodium 136  135 - 145 (mEq/L)    Potassium 4.1  3.5 - 5.1 (mEq/L)    Chloride 102  96 - 112 (mEq/L)    CO2 27  19 - 32 (mEq/L)    Glucose, Bld 149 (*) 70 - 99 (mg/dL)    BUN 14  6 - 23 (mg/dL)    Creatinine, Ser 9.56  0.50 - 1.35 (mg/dL)    Calcium 8.9  8.4 - 10.5 (mg/dL)    GFR calc non Af Amer >90  >90 (mL/min)    GFR calc Af Amer >90  >90 (mL/min)     Ct Abdomen Pelvis W Contrast  12/01/2011  *RADIOLOGY REPORT*  Clinical Data: Left lower quadrant abdominal pain  CT ABDOMEN AND PELVIS WITH CONTRAST  Technique:  Multidetector CT imaging of the abdomen and pelvis was performed following the standard protocol  during bolus administration of intravenous contrast.  Contrast: OMNIPAQUE IOHEXOL 300 MG/ML  SOLN  Comparison: 08/15/2011 CT  Findings: Mild dependent atelectasis.  Unchanged tiny lung base nodules, the largest of which is a a 4 mm subpleural nodule posteriorly within the left lower lobe.  Normal heart size.  Hepatic steatosis.  Unremarkable biliary system, spleen, pancreas, adrenal glands, kidneys.  No hydronephrosis or hydroureter.  No bowel obstruction.  No CT evidence for colitis.  Appendix not identified.  No right lower quadrant inflammation.  No free intraperitoneal air or fluid.  No lymphadenopathy.  Normal caliber vasculature.  Thin-walled bladder.  Left orchiectomy with prosthesis.  Right iliac bone island.  No acute osseous abnormality.  IMPRESSION: Tiny lung base nodules, similar  to the recent comparison. Metastatic disease again not excluded. Attention on non emergent chest CT follow-up.  Hepatic steatosis.  No findings to explain the patient's acute left lower quadrant pain.  Original Report Authenticated By: Waneta Martins, M.D.    ROS: See chart Blood pressure 133/74, pulse 79, temperature 97.6 F (36.4 C), temperature source Oral, resp. rate 18, height 5\' 10"  (1.778 m), weight 101.334 kg (223 lb 6.4 oz), SpO2 95.00%. Physical Exam: Pleasant white male who appears his stated age, complaining of pain in the left groin region. Abdomen: Soft with no specific rigidity or hepatosplenomegaly noted. In the left groin region, and oblique surgical scar is noted. He does have weakness in the left inguinal region with point tenderness over the internal ring. There is some laxity of the left inguinal region, though I did not specifically reduce any hernia.  Assessment/Plan: Impression: Left groin pain probably secondary to an occult left inguinal hernia or musculoskeletal strain, status post left groin incision in the past. Plan: I explained to the patient that he does not need acute surgical intervention at this time. He may have a left inguinal hernia or muscular pain secondary to a strain in the left groin region that has been previously operated on. He would like to be discharged, which is fine with me. I will see him as an outpatient.  Takuya Lariccia A 12/02/2011, 10:02 AM

## 2011-12-02 NOTE — H&P (Signed)
Chief Complaint:  Left lower quadrant abdominal pain  HPI: This is a 36 year old male who has a history of testicular cancer in 2006  status post oophorectomy and chemotherapy who comes in with 2 days of left lower quadrant abdominal pain. He had a testicular prosthesis on the left which has not had any problems. He has been nauseated without any vomiting. He did not have any diarrhea and he had a normal bowel movement earlier today which was normal and nonbloody. He denies any fevers but does describe some chills. CT of his abdomen and pelvis is negative. We're being asked to admit the patient for close observation because of the unclear etiology of this abdominal pain. He has never experienced anything like this before. He denies any radiation of the abdominal pain into the groin or testicular area. He denies any hematuria, dysuria, or penile discharge.  Review of Systems:  Otherwise negative  Past Medical History: Past Medical History  Diagnosis Date  . Cancer 02/2005    TESTICULAR  . Family history of early CAD 02/2005  . Migraine headache 05/2006  . Hyperlipidemia   . Hypertension    Past Surgical History  Procedure Date  . Orchietomy 02/2005  . Psf 11/2008  . Cervical spine surgery 2008  . Knee surgery     Medications: Prior to Admission medications   Medication Sig Start Date End Date Taking? Authorizing Provider  fexofenadine-pseudoephedrine (ALLEGRA-D) 60-120 MG per tablet Take 1 tablet by mouth at bedtime.   Yes Historical Provider, MD  naproxen (NAPROSYN) 500 MG tablet Take 1 tablet (500 mg total) by mouth 2 (two) times daily with a meal. 10/25/11 10/24/12  Thao P Le, DO    Allergies:   Allergies  Allergen Reactions  . Codeine     Social History:  reports that he has never smoked. He does not have any smokeless tobacco history on file. He reports that he does not drink alcohol or use illicit drugs.  Family History: Family History  Problem Relation Age of Onset   . Cancer Mother   . Cancer Father     Physical Exam: Filed Vitals:   12/01/11 2056  BP: 151/95  Pulse: 85  Temp: 98.5 F (36.9 C)  TempSrc: Oral  Resp: 20  Height: 5\' 10"  (1.778 m)  Weight: 95.709 kg (211 lb)  SpO2: 98%   BP 127/80  Pulse 66  Temp(Src) 97.6 F (36.4 C) (Oral)  Resp 16  Ht 5\' 10"  (1.778 m)  Wt 95.709 kg (211 lb)  BMI 30.28 kg/m2  SpO2 94% General appearance: alert, cooperative and no distress Lungs: clear to auscultation bilaterally Heart: regular rate and rhythm, S1, S2 normal, no murmur, click, rub or gallop Abdomen: ttp llq soft nd no r/g nonacute pos bs Male genitalia: normal, penis: no lesions or discharge. testes: no masses or tenderness. no hernias left testes prosthetic noted Extremities: extremities normal, atraumatic, no cyanosis or edema Pulses: 2+ and symmetric Skin: Skin color, texture, turgor normal. No rashes or lesions Neurologic: Grossly normal    Labs on Admission:   [facility] 12/01/11 2151  NA 138  K 3.7  CL 102  CO2 27  GLUCOSE 102*  BUN 15  CREATININE 0.96  CALCIUM 9.3  MG --  PHOS --    Basename 12/01/11 2151  AST 26  ALT 48  ALKPHOS 88  BILITOT 0.2*  PROT 7.2  ALBUMIN 4.0    Basename 12/01/11 2151  WBC 7.6  NEUTROABS 4.0  HGB 15.6  HCT 44.9  MCV 87.5  PLT 271    Radiological Exams on Admission: Ct Abdomen Pelvis W Contrast  12/01/2011  *RADIOLOGY REPORT*  Clinical Data: Left lower quadrant abdominal pain  CT ABDOMEN AND PELVIS WITH CONTRAST  Technique:  Multidetector CT imaging of the abdomen and pelvis was performed following the standard protocol during bolus administration of intravenous contrast.  Contrast: OMNIPAQUE IOHEXOL 300 MG/ML  SOLN  Comparison: 08/15/2011 CT  Findings: Mild dependent atelectasis.  Unchanged tiny lung base nodules, the largest of which is a a 4 mm subpleural nodule posteriorly within the left lower lobe.  Normal heart size.  Hepatic steatosis.  Unremarkable biliary  system, spleen, pancreas, adrenal glands, kidneys.  No hydronephrosis or hydroureter.  No bowel obstruction.  No CT evidence for colitis.  Appendix not identified.  No right lower quadrant inflammation.  No free intraperitoneal air or fluid.  No lymphadenopathy.  Normal caliber vasculature.  Thin-walled bladder.  Left orchiectomy with prosthesis.  Right iliac bone island.  No acute osseous abnormality.  IMPRESSION: Tiny lung base nodules, similar to the recent comparison. Metastatic disease again not excluded. Attention on non emergent chest CT follow-up.  Hepatic steatosis.  No findings to explain the patient's acute left lower quadrant pain.  Original Report Authenticated By: Waneta Martins, M.D.    Assessment/Plan Present on Admission:  36 year old male with left lower quadrant abdominal pain x2 days  .Abdominal pain, LLQ .Hypertension  Clinically it appears as if he is having early onset of acute diverticulitis despite normal white count and no CT findings. I'm going to empirically start him on ciprofloxacin and Flagyl. We'll obtain a surgical consult to make sure we are not missing anything particularly with his history of testicular cancer. Etiology of his pain is otherwise unclear. Start antibiotics IV fluids Zofran and Dilaudid and see how he does.  Tyronne Blann A [phone] 12/02/2011, 1:52 AM

## 2011-12-02 NOTE — Discharge Instructions (Signed)
Inguinal Hernia, Adult  Muscles help keep everything in the body in its proper place. But if a weak spot in the muscles develops, something can poke through. That is called a hernia. When this happens in the lower part of the belly (abdomen), it is called an inguinal hernia. (It takes its name from a part of the body in this region called the inguinal canal.) A weak spot in the wall of muscles lets some fat or part of the small intestine bulge through. An inguinal hernia can develop at any age. Men get them more often than women.  CAUSES   In adults, an inguinal hernia develops over time.  · It can be triggered by:  · Suddenly straining the muscles of the lower abdomen.  · Lifting heavy objects.  · Straining to have a bowel movement. Difficult bowel movements (constipation) can lead to this.  · Constant coughing. This may be caused by smoking or lung disease.  · Being overweight.  · Being pregnant.  · Working at a job that requires long periods of standing or heavy lifting.  · Having had an inguinal hernia before.  One type can be an emergency situation. It is called a strangulated inguinal hernia. It develops if part of the small intestine slips through the weak spot and cannot get back into the abdomen. The blood supply can be cut off. If that happens, part of the intestine may die. This situation requires emergency surgery.  SYMPTOMS   Often, a small inguinal hernia has no symptoms. It is found when a healthcare provider does a physical exam. Larger hernias usually have symptoms.   · In adults, symptoms may include:  · A lump in the groin. This is easier to see when the person is standing. It might disappear when lying down.  · In men, a lump in the scrotum.  · Pain or burning in the groin. This occurs especially when lifting, straining or coughing.  · A dull ache or feeling of pressure in the groin.  · Signs of a strangulated hernia can include:  · A bulge in the groin that becomes very painful and tender to the  touch.  · A bulge that turns red or purple.  · Fever, nausea and vomiting.  · Inability to have a bowel movement or to pass gas.  DIAGNOSIS   To decide if you have an inguinal hernia, a healthcare provider will probably do a physical examination.  · This will include asking questions about any symptoms you have noticed.  · The healthcare provider might feel the groin area and ask you to cough. If an inguinal hernia is felt, the healthcare provider may try to slide it back into the abdomen.  · Usually no other tests are needed.  TREATMENT   Treatments can vary. The size of the hernia makes a difference. Options include:  · Watchful waiting. This is often suggested if the hernia is small and you have had no symptoms.  · No medical procedure will be done unless symptoms develop.  · You will need to watch closely for symptoms. If any occur, contact your healthcare provider right away.  · Surgery. This is used if the hernia is larger or you have symptoms.  · Open surgery. This is usually an outpatient procedure (you will not stay overnight in a hospital). An cut (incision) is made through the skin in the groin. The hernia is put back inside the abdomen. The weak area in the muscles is   then repaired by herniorrhaphy or hernioplasty. Herniorrhaphy: in this type of surgery, the weak muscles are sewn back together. Hernioplasty: a patch or mesh is used to close the weak area in the abdominal wall.  · Laparoscopy. In this procedure, a surgeon makes small incisions. A thin tube with a tiny video camera (called a laparoscope) is put into the abdomen. The surgeon repairs the hernia with mesh by looking with the video camera and using two long instruments.  HOME CARE INSTRUCTIONS   · After surgery to repair an inguinal hernia:  · You will need to take pain medicine prescribed by your healthcare provider. Follow all directions carefully.  · You will need to take care of the wound from the incision.  · Your activity will be  restricted for awhile. This will probably include no heavy lifting for several weeks. You also should not do anything too active for a few weeks. When you can return to work will depend on the type of job that you have.  · During "watchful waiting" periods, you should:  · Maintain a healthy weight.  · Eat a diet high in fiber (fruits, vegetables and whole grains).  · Drink plenty of fluids to avoid constipation. This means drinking enough water and other liquids to keep your urine clear or pale yellow.  · Do not lift heavy objects.  · Do not stand for long periods of time.  · Quit smoking. This should keep you from developing a frequent cough.  SEEK MEDICAL CARE IF:   · A bulge develops in your groin area.  · You feel pain, a burning sensation or pressure in the groin. This might be worse if you are lifting or straining.  · You develop a fever of more than 100.5° F (38.1° C).  SEEK IMMEDIATE MEDICAL CARE IF:   · Pain in the groin increases suddenly.  · A bulge in the groin gets bigger suddenly and does not go down.  · For men, there is sudden pain in the scrotum. Or, the size of the scrotum increases.  · A bulge in the groin area becomes red or purple and is painful to touch.  · You have nausea or vomiting that does not go away.  · You feel your heart beating much faster than normal.  · You cannot have a bowel movement or pass gas.  · You develop a fever of more than 102.0° F (38.9° C).  Document Released: 12/08/2008 Document Revised: 07/11/2011 Document Reviewed: 12/08/2008  [employer]® Patient Information ©2012 [employer].

## 2011-12-02 NOTE — Progress Notes (Signed)
Subjective: The patient complains of 10 over 10 on the floor quite or pain. He describes the pain as sharp and burning. It is positional. It is not associated with bowel movements. It is not associated with pain with urination. It does not radiate to his scrotum. Pain medication is helping, but is causing him a lot of nausea.  Objective: Vital signs in last 24 hours: Filed Vitals:   12/01/11 2056 12/02/11 0212 12/02/11 0511  BP: 151/95 127/80 133/74  Pulse: 85 66 79  Temp: 98.5 F (36.9 C) 97.6 F (36.4 C) 97.6 F (36.4 C)  TempSrc: Oral Oral Oral  Resp: 20 16 18   Height: 5\' 10"  (1.778 m) 5\' 10"  (1.778 m)   Weight: 95.709 kg (211 lb) 101.334 kg (223 lb 6.4 oz)   SpO2: 98% 94% 95%    Intake/Output Summary (Last 24 hours) at 12/02/11 0949 Last data filed at 12/02/11 0800  Gross per 24 hour  Intake      0 ml  Output      0 ml  Net      0 ml    Weight change:   Physical exam: 36 year old Caucasian man, uncomfortable appearing as he is in pain. Lungs: Clear to auscultation bilaterally. Heart: S1, S2, with no murmurs rubs or gallops. Abdomen: Mildly obese, positive bowel sounds, exquisitely tender at the left lower quadrant without appreciable mass palpated. No obvious rigidity. Voluntary guarding. No left lower quadrant rash appreciated. Extremities: No pedal edema.  Lab Results: Basic Metabolic Panel:  Basename 12/02/11 0512 12/01/11 2151  NA 136 138  K 4.1 3.7  CL 102 102  CO2 27 27  GLUCOSE 149* 102*  BUN 14 15  CREATININE 1.03 0.96  CALCIUM 8.9 9.3  MG -- --  PHOS -- --   Liver Function Tests:  [facility] 12/01/11 2151  AST 26  ALT 48  ALKPHOS 88  BILITOT 0.2*  PROT 7.2  ALBUMIN 4.0   No results found for this basename: LIPASE:2,AMYLASE:2 in the last 72 hours No results found for this basename: AMMONIA:2 in the last 72 hours CBC:  Basename 12/02/11 0512 12/01/11 2151  WBC 9.2 7.6  NEUTROABS -- 4.0  HGB 15.0 15.6  HCT 44.0 44.9  MCV 88.5 87.5  PLT  265 271   Cardiac Enzymes: No results found for this basename: CKTOTAL:3,CKMB:3,CKMBINDEX:3,TROPONINI:3 in the last 72 hours BNP: No results found for this basename: PROBNP:3 in the last 72 hours D-Dimer: No results found for this basename: DDIMER:2 in the last 72 hours CBG: No results found for this basename: GLUCAP:6 in the last 72 hours Hemoglobin A1C: No results found for this basename: HGBA1C in the last 72 hours Fasting Lipid Panel: No results found for this basename: CHOL,HDL,LDLCALC,TRIG,CHOLHDL,LDLDIRECT in the last 72 hours Thyroid Function Tests: No results found for this basename: TSH,T4TOTAL,FREET4,T3FREE,THYROIDAB in the last 72 hours Anemia Panel: No results found for this basename: VITAMINB12,FOLATE,FERRITIN,TIBC,IRON,RETICCTPCT in the last 72 hours Coagulation: No results found for this basename: LABPROT:2,INR:2 in the last 72 hours Urine Drug Screen: Drugs of Abuse  No results found for this basename: labopia, cocainscrnur, labbenz, amphetmu, thcu, labbarb    Alcohol Level: No results found for this basename: ETH:2 in the last 72 hours Urinalysis:  Basename 12/02/11 0018  COLORURINE YELLOW  LABSPEC 1.010  PHURINE 7.5  GLUCOSEU NEGATIVE  HGBUR NEGATIVE  BILIRUBINUR NEGATIVE  KETONESUR NEGATIVE  PROTEINUR NEGATIVE  UROBILINOGEN 0.2  NITRITE NEGATIVE  LEUKOCYTESUR NEGATIVE   Misc. Labs:   Micro: No results  found for this or any previous visit (from the past 240 hour(s)).  Studies/Results: Ct Abdomen Pelvis W Contrast  12/01/2011  *RADIOLOGY REPORT*  Clinical Data: Left lower quadrant abdominal pain  CT ABDOMEN AND PELVIS WITH CONTRAST  Technique:  Multidetector CT imaging of the abdomen and pelvis was performed following the standard protocol during bolus administration of intravenous contrast.  Contrast: OMNIPAQUE IOHEXOL 300 MG/ML  SOLN  Comparison: 08/15/2011 CT  Findings: Mild dependent atelectasis.  Unchanged tiny lung base nodules, the  largest of which is a a 4 mm subpleural nodule posteriorly within the left lower lobe.  Normal heart size.  Hepatic steatosis.  Unremarkable biliary system, spleen, pancreas, adrenal glands, kidneys.  No hydronephrosis or hydroureter.  No bowel obstruction.  No CT evidence for colitis.  Appendix not identified.  No right lower quadrant inflammation.  No free intraperitoneal air or fluid.  No lymphadenopathy.  Normal caliber vasculature.  Thin-walled bladder.  Left orchiectomy with prosthesis.  Right iliac bone island.  No acute osseous abnormality.  IMPRESSION: Tiny lung base nodules, similar to the recent comparison. Metastatic disease again not excluded. Attention on non emergent chest CT follow-up.  Hepatic steatosis.  No findings to explain the patient's acute left lower quadrant pain.  Original Report Authenticated By: Waneta Martins, M.D.    Medications:  Scheduled:   . sodium chloride   Intravenous STAT  . ciprofloxacin  400 mg Intravenous Q12H  . gabapentin  100 mg Oral BID  .  HYDROmorphone (DILAUDID) injection  1 mg Intravenous Once  .  HYDROmorphone (DILAUDID) injection  1 mg Intravenous Once  . metronidazole  500 mg Intravenous Q8H  . ondansetron  4 mg Intravenous Once  . pantoprazole (PROTONIX) IV  40 mg Intravenous Once   Continuous:   . 0.9 % NaCl with KCl 20 mEq / L 75 mL/hr at 12/02/11 0340   WUJ:WJXBJYNWGNFAO (DILAUDID) injection, iohexol, ondansetron (ZOFRAN) IV, ondansetron, promethazine, DISCONTD: HYDROmorphone  Assessment: Principal Problem:  *Abdominal pain, LLQ Active Problems:  H/O testicular cancer  Hypertension   1. Left lower quadrant abdominal pain. Etiology unknown at this time. It does not appear to be an infective process, however, agree with Cipro and Flagyl for now. The differential diagnoses include incarcerated or strangulate hernia, musculoskeletal pain, neuropathic pain, or other. General surgery has been consulted. We will await their evaluation  and recommendations. We'll try to treat his pain and nausea accordingly.  Hypertension. His blood pressure is elevated, likely in part from pain. He is not treated with an antihypertensive medication.  Mild nonfasting hyperglycemia.    Plan:  1. Will decrease the dose of hydromorphone as it is causing nausea. Will continue as needed Zofran and as needed Phenergan. 2. We'll start Neurontin empirically to see if it helps modulate his pain. 3. Gen. surgery consult pending. 4. Advance diet as tolerated, but continued to liquids until general surgery see the patient.   LOS: 1 day   Naydeen Speirs 12/02/2011, 9:49 AM

## 2011-12-02 NOTE — Discharge Summary (Signed)
Pt dc'd home with belongings, RX, and education materials accompanied by spouse

## 2011-12-02 NOTE — Plan of Care (Signed)
Problem: Phase I Progression Outcomes Goal: Pain controlled with appropriate interventions Outcome: Progressing Pain is decreased after giving pain medication, but still present.

## 2011-12-02 NOTE — Discharge Summary (Signed)
Physician Discharge Summary  Scott Moss MRN: [id number] DOB/AGE: 01/27/1976 35 y.o.  PCP: Oncologist: Dr. Arlan Organ   Admit date: 12/01/2011 Discharge date: 12/02/2011  Discharge Diagnoses:  1. Acute left lower quadrant abdominal pain, likely secondary to an occult left inguinal hernia or musculoskeletal strain, status post left groin incision in the past. 2. History of testicular cancer. 3. History of hypertension. 4. Nausea from opiate analgesic.  5. Tiny lung base nodules, stable from previous CT. Followup CT scan in 3-6 months recommended.  Medication List  As of 12/02/2011 12:29 PM   TAKE these medications         fexofenadine-pseudoephedrine 60-120 MG per tablet   Commonly known as: ALLEGRA-D   Take 1 tablet by mouth at bedtime.      naproxen 500 MG tablet   Commonly known as: NAPROSYN   Take 1 tablet (500 mg total) by mouth 2 (two) times daily with a meal.      oxyCODONE-acetaminophen 5-325 MG per tablet   Commonly known as: PERCOCET   Take 1 tablet by mouth every 4 (four) hours as needed for pain.      promethazine 12.5 MG tablet   Commonly known as: PHENERGAN   Take 1 tablet (12.5 mg total) by mouth every 6 (six) hours as needed for nausea.            Discharge Condition: Stable.  Disposition:  home.   Consults: Franky Macho, M.D.   Significant Diagnostic Studies: Ct Abdomen Pelvis W Contrast  12/01/2011  *RADIOLOGY REPORT*  Clinical Data: Left lower quadrant abdominal pain  CT ABDOMEN AND PELVIS WITH CONTRAST  Technique:  Multidetector CT imaging of the abdomen and pelvis was performed following the standard protocol during bolus administration of intravenous contrast.  Contrast: OMNIPAQUE IOHEXOL 300 MG/ML  SOLN  Comparison: 08/15/2011 CT  Findings: Mild dependent atelectasis.  Unchanged tiny lung base nodules, the largest of which is a a 4 mm subpleural nodule posteriorly within the left lower lobe.  Normal heart size.  Hepatic  steatosis.  Unremarkable biliary system, spleen, pancreas, adrenal glands, kidneys.  No hydronephrosis or hydroureter.  No bowel obstruction.  No CT evidence for colitis.  Appendix not identified.  No right lower quadrant inflammation.  No free intraperitoneal air or fluid.  No lymphadenopathy.  Normal caliber vasculature.  Thin-walled bladder.  Left orchiectomy with prosthesis.  Right iliac bone island.  No acute osseous abnormality.  IMPRESSION: Tiny lung base nodules, similar to the recent comparison. Metastatic disease again not excluded. Attention on non emergent chest CT follow-up.  Hepatic steatosis.  No findings to explain the patient's acute left lower quadrant pain.  Original Report Authenticated By: Waneta Martins, M.D.     Microbiology: No results found for this or any previous visit (from the past 240 hour(s)).   Labs: Results for orders placed during the hospital encounter of 12/01/11 (from the past 48 hour(s))  CBC     Status: Normal   Collection Time   12/01/11  9:51 PM      Component Value Range Comment   WBC 7.6  4.0 - 10.5 (K/uL)    RBC 5.13  4.22 - 5.81 (MIL/uL)    Hemoglobin 15.6  13.0 - 17.0 (g/dL)    HCT 29.5  28.4 - 13.2 (%)    MCV 87.5  78.0 - 100.0 (fL)    MCH 30.4  26.0 - 34.0 (pg)    MCHC 34.7  30.0 - 36.0 (g/dL)  RDW 12.4  11.5 - 15.5 (%)    Platelets 271  150 - 400 (K/uL)   DIFFERENTIAL     Status: Normal   Collection Time   12/01/11  9:51 PM      Component Value Range Comment   Neutrophils Relative 53  43 - 77 (%)    Neutro Abs 4.0  1.7 - 7.7 (K/uL)    Lymphocytes Relative 36  12 - 46 (%)    Lymphs Abs 2.7  0.7 - 4.0 (K/uL)    Monocytes Relative 7  3 - 12 (%)    Monocytes Absolute 0.5  0.1 - 1.0 (K/uL)    Eosinophils Relative 4  0 - 5 (%)    Eosinophils Absolute 0.3  0.0 - 0.7 (K/uL)    Basophils Relative 0  0 - 1 (%)    Basophils Absolute 0.0  0.0 - 0.1 (K/uL)   COMPREHENSIVE METABOLIC PANEL     Status: Abnormal   Collection Time   12/01/11   9:51 PM      Component Value Range Comment   Sodium 138  135 - 145 (mEq/L)    Potassium 3.7  3.5 - 5.1 (mEq/L)    Chloride 102  96 - 112 (mEq/L)    CO2 27  19 - 32 (mEq/L)    Glucose, Bld 102 (*) 70 - 99 (mg/dL)    BUN 15  6 - 23 (mg/dL)    Creatinine, Ser 1.61  0.50 - 1.35 (mg/dL)    Calcium 9.3  8.4 - 10.5 (mg/dL)    Total Protein 7.2  6.0 - 8.3 (g/dL)    Albumin 4.0  3.5 - 5.2 (g/dL)    AST 26  0 - 37 (U/L)    ALT 48  0 - 53 (U/L)    Alkaline Phosphatase 88  39 - 117 (U/L)    Total Bilirubin 0.2 (*) 0.3 - 1.2 (mg/dL)    GFR calc non Af Amer >90  >90 (mL/min)    GFR calc Af Amer >90  >90 (mL/min)   URINALYSIS, ROUTINE W REFLEX MICROSCOPIC     Status: Normal   Collection Time   12/02/11 12:18 AM      Component Value Range Comment   Color, Urine YELLOW  YELLOW     APPearance CLEAR  CLEAR     Specific Gravity, Urine 1.010  1.005 - 1.030     pH 7.5  5.0 - 8.0     Glucose, UA NEGATIVE  NEGATIVE (mg/dL)    Hgb urine dipstick NEGATIVE  NEGATIVE     Bilirubin Urine NEGATIVE  NEGATIVE     Ketones, ur NEGATIVE  NEGATIVE (mg/dL)    Protein, ur NEGATIVE  NEGATIVE (mg/dL)    Urobilinogen, UA 0.2  0.0 - 1.0 (mg/dL)    Nitrite NEGATIVE  NEGATIVE     Leukocytes, UA NEGATIVE  NEGATIVE  MICROSCOPIC NOT DONE ON URINES WITH NEGATIVE PROTEIN, BLOOD, LEUKOCYTES, NITRITE, OR GLUCOSE <1000 mg/dL.  CBC     Status: Normal   Collection Time   12/02/11  5:12 AM      Component Value Range Comment   WBC 9.2  4.0 - 10.5 (K/uL)    RBC 4.97  4.22 - 5.81 (MIL/uL)    Hemoglobin 15.0  13.0 - 17.0 (g/dL)    HCT 09.6  04.5 - 40.9 (%)    MCV 88.5  78.0 - 100.0 (fL)    MCH 30.2  26.0 - 34.0 (pg)    MCHC  34.1  30.0 - 36.0 (g/dL)    RDW 30.8  65.7 - 84.6 (%)    Platelets 265  150 - 400 (K/uL)   BASIC METABOLIC PANEL     Status: Abnormal   Collection Time   12/02/11  5:12 AM      Component Value Range Comment   Sodium 136  135 - 145 (mEq/L)    Potassium 4.1  3.5 - 5.1 (mEq/L)    Chloride 102  96 - 112  (mEq/L)    CO2 27  19 - 32 (mEq/L)    Glucose, Bld 149 (*) 70 - 99 (mg/dL)    BUN 14  6 - 23 (mg/dL)    Creatinine, Ser 9.62  0.50 - 1.35 (mg/dL)    Calcium 8.9  8.4 - 10.5 (mg/dL)    GFR calc non Af Amer >90  >90 (mL/min)    GFR calc Af Amer >90  >90 (mL/min)      HPI : The patient is a 36 year old man with a past medical history significant for testicular cancer and status post orchiectomy, who presented to the emergency department on April 28th 2013 with a chief complaint of severe left lower quadrant pain. In the emergency department, he was noted to be afebrile and hemodynamically stable. A CT scan of his abdomen revealed tiny lung base nodules, similar to recent comparison, hepatic steatosis, but no acute intra-abdominal findings to explain the left lower quadrant pain. He was admitted for further evaluation and management.  HOSPITAL COURSE: The patient was started on as needed IV hydromorphone and as needed Zofran for nausea. Although the CT scan were not suggestive of diverticulitis and there was no elevated white blood cell count, he was started empirically on IV Cipro and Flagyl. Gentle IV fluids were started for hydration. General surgeon, Dr. Lovell Sheehan was consulted. Following his evaluation, he believed that the patient probably had an occult left inguinal hernia or musculoskeletal strain, status post left groin incision in the past. He did not need surgical intervention at the time per his assessment. The patient wanted to be discharged to home today on pain medication, and Dr. Lovell Sheehan agreed that that was okay.  The patient remained afebrile and hemodynamically stable. He was discharged to home in stable condition and less pain.    Discharge Exam: See exam on previous note. Blood pressure 133/74, pulse 79, temperature 97.6 F (36.4 C), temperature source Oral, resp. rate 18, height 5\' 10"  (1.778 m), weight 101.334 kg (223 lb 6.4 oz), SpO2 95.00%.     Discharge Orders    Future  Orders Please Complete By Expires   Diet - low sodium heart healthy      Increase activity slowly         Follow-up Information    Follow up with Dalia Heading, MD. Schedule an appointment as soon as possible for a visit on 12/12/2011.   Contact information:   [address] [place] [address] [phone]          Discharge time: 30 minutes.  Signed: Jamesa Tedrick 12/02/2011, 12:29 PM

## 2011-12-06 ENCOUNTER — Encounter (HOSPITAL_COMMUNITY): Payer: Self-pay | Admitting: *Deleted

## 2011-12-06 ENCOUNTER — Emergency Department (HOSPITAL_COMMUNITY)
Admission: EM | Admit: 2011-12-06 | Discharge: 2011-12-07 | Disposition: A | Discharge status: Home / Self Care | Payer: BC Managed Care – PPO | Attending: Emergency Medicine | Admitting: Emergency Medicine

## 2011-12-06 ENCOUNTER — Emergency Department (HOSPITAL_COMMUNITY): Payer: BC Managed Care – PPO

## 2011-12-06 DIAGNOSIS — M549 Dorsalgia, unspecified: Secondary | ICD-10-CM | POA: Insufficient documentation

## 2011-12-06 DIAGNOSIS — R109 Unspecified abdominal pain: Secondary | ICD-10-CM | POA: Insufficient documentation

## 2011-12-06 DIAGNOSIS — R911 Solitary pulmonary nodule: Secondary | ICD-10-CM | POA: Insufficient documentation

## 2011-12-06 DIAGNOSIS — Z8547 Personal history of malignant neoplasm of testis: Secondary | ICD-10-CM | POA: Insufficient documentation

## 2011-12-06 DIAGNOSIS — R10819 Abdominal tenderness, unspecified site: Secondary | ICD-10-CM | POA: Insufficient documentation

## 2011-12-06 MED ORDER — HYDROMORPHONE HCL PF 2 MG/ML IJ SOLN
2.0000 mg | Freq: Once | INTRAMUSCULAR | Status: AC
  Administered 2011-12-06: 2 mg via INTRAVENOUS
  Filled 2011-12-06: qty 1

## 2011-12-06 MED ORDER — ONDANSETRON HCL 4 MG/2ML IJ SOLN
4.0000 mg | Freq: Once | INTRAMUSCULAR | Status: AC
  Administered 2011-12-06: 4 mg via INTRAVENOUS
  Filled 2011-12-06: qty 2

## 2011-12-06 MED ORDER — KETOROLAC TROMETHAMINE 30 MG/ML IJ SOLN
30.0000 mg | Freq: Once | INTRAMUSCULAR | Status: AC
  Administered 2011-12-06: 30 mg via INTRAVENOUS
  Filled 2011-12-06: qty 1

## 2011-12-06 NOTE — ED Provider Notes (Signed)
This chart was scribed for [employer]. Colon Branch, MD by [name]. The patient was seen in room APA11/APA11 and the patient's care was started at 11:20 PM.   CSN: [id number]  Arrival date & time 12/06/11  2236   First MD Initiated Contact with Patient 12/06/11 2309      Chief Complaint  Patient presents with  . Abdominal Pain    (Consider location/radiation/quality/duration/timing/severity/associated sxs/prior treatment) Scott Moss is a 36 y.o. male who presents to the Emergency Department complaining of sudden onset, persistence of constant, gradually worsening, diffuse abdominal pain onset one week ago. The pain radiates to his back (both sides) w/ swelling and nausea. Pt was seen in the ED 5 days ago for the same sx, but sx have worsened, pain was localized to LLQ at the time. Pt's bm's are looser than they were 5 days ago and every time he eats he has a bm within 10 minutes. Pt was rx'd roxicet and phenegran during last visit but stopped taking it 2 days ago. Pt also c/o rash on left arm, denies loss of appetite, fever, hematuria, chills, trouble urinating, vomiting.  There are no other associated symptoms and no other alleviating or aggravating factors.   Past Medical History  Diagnosis Date  . Cancer 02/2005    TESTICULAR  . Family history of early CAD 02/2005  . Hyperlipidemia   . Hypertension   . Hepatic steatosis   . Pulmonary nodules     Past Surgical History  Procedure Date  . Orchietomy 02/2005  . Psf 11/2008  . Cervical spine surgery 2008  . Knee surgery   . Appendectomy     Family History  Problem Relation Age of Onset  . Cancer Mother   . Hypertension Mother   . Stroke Mother   . Migraines Mother   . Cancer Father   . Hypertension Father   . Heart attack Father   . Cancer Sister   . Hypertension Sister   . Migraines Sister     History  Substance Use Topics  . Smoking status: Never Smoker   . Smokeless tobacco: Not on file  . Alcohol Use:  1.2 oz/week    2 Cans of beer per week     2 cans /week at times, then may go months with none.      Review of Systems 10 Systems reviewed and all are negative for acute change except as noted in the Scott.    Allergies  Codeine  Home Medications   Current Outpatient Rx  Name Route Sig Dispense Refill  . FEXOFENADINE-PSEUDOEPHED ER 60-120 MG PO TB12 Oral Take 1 tablet by mouth at bedtime.    [name] NAPROXEN 500 MG PO TABS Oral Take 1 tablet (500 mg total) by mouth 2 (two) times daily with a meal. 60 tablet 0  . OXYCODONE-ACETAMINOPHEN 5-325 MG PO TABS Oral Take 1 tablet by mouth every 4 (four) hours as needed for pain. 30 tablet 0  . PROMETHAZINE HCL 12.5 MG PO TABS Oral Take 1 tablet (12.5 mg total) by mouth every 6 (six) hours as needed for nausea. 30 tablet 0    BP 154/87  Temp(Src) 97.2 F (36.2 C) (Oral)  Resp 20  Ht 5\' 10"  (1.778 m)  Wt 210 lb (95.255 kg)  BMI 30.13 kg/m2  SpO2 100%  Physical Exam  Nursing note and vitals reviewed. Constitutional: He is oriented to person, place, and time. He appears well-developed and well-nourished. No distress.  HENT:  Head: Normocephalic and atraumatic.  Eyes: EOM are normal. Pupils are equal, round, and reactive to light.  Neck: Normal range of motion. Neck supple. No tracheal deviation present.  Cardiovascular: Normal rate and regular rhythm.   Pulmonary/Chest: Effort normal and breath sounds normal. No respiratory distress.  Abdominal: Soft. Bowel sounds are normal. He exhibits no distension. There is tenderness. There is guarding. There is no rebound.       Diffuse pain across abdomen with guarding but no rebound   Musculoskeletal: Normal range of motion. He exhibits tenderness. He exhibits no edema.       Back pain  Neurological: He is alert and oriented to person, place, and time. No sensory deficit.  Skin: Skin is warm and dry.  Psychiatric: He has a normal mood and affect. His behavior is normal.    ED Course    Procedures (including critical care time) DIAGNOSTIC STUDIES: Oxygen Saturation is 100% on room air, normal by my interpretation.    COORDINATION OF CARE: Results for orders placed during the hospital encounter of 12/06/11  CBC      Component Value Range   WBC 8.0  4.0 - 10.5 (K/uL)   RBC 5.27  4.22 - 5.81 (MIL/uL)   Hemoglobin 16.0  13.0 - 17.0 (g/dL)   HCT 21.3  08.6 - 57.8 (%)   MCV 87.3  78.0 - 100.0 (fL)   MCH 30.4  26.0 - 34.0 (pg)   MCHC 34.8  30.0 - 36.0 (g/dL)   RDW 46.9  62.9 - 52.8 (%)   Platelets 286  150 - 400 (K/uL)  DIFFERENTIAL      Component Value Range   Neutrophils Relative 56  43 - 77 (%)   Neutro Abs 4.5  1.7 - 7.7 (K/uL)   Lymphocytes Relative 33  12 - 46 (%)   Lymphs Abs 2.6  0.7 - 4.0 (K/uL)   Monocytes Relative 7  3 - 12 (%)   Monocytes Absolute 0.6  0.1 - 1.0 (K/uL)   Eosinophils Relative 3  0 - 5 (%)   Eosinophils Absolute 0.3  0.0 - 0.7 (K/uL)   Basophils Relative 0  0 - 1 (%)   Basophils Absolute 0.0  0.0 - 0.1 (K/uL)  COMPREHENSIVE METABOLIC PANEL      Component Value Range   Sodium 139  135 - 145 (mEq/L)   Potassium 3.5  3.5 - 5.1 (mEq/L)   Chloride 102  96 - 112 (mEq/L)   CO2 27  19 - 32 (mEq/L)   Glucose, Bld 86  70 - 99 (mg/dL)   BUN 14  6 - 23 (mg/dL)   Creatinine, Ser 4.13  0.50 - 1.35 (mg/dL)   Calcium 9.6  8.4 - 24.4 (mg/dL)   Total Protein 7.6  6.0 - 8.3 (g/dL)   Albumin 4.3  3.5 - 5.2 (g/dL)   AST 31  0 - 37 (U/L)   ALT 53  0 - 53 (U/L)   Alkaline Phosphatase 87  39 - 117 (U/L)   Total Bilirubin 0.4  0.3 - 1.2 (mg/dL)   GFR calc non Af Amer >90  >90 (mL/min)   GFR calc Af Amer >90  >90 (mL/min)  URINALYSIS, ROUTINE W REFLEX MICROSCOPIC      Component Value Range   Color, Urine YELLOW  YELLOW    APPearance CLEAR  CLEAR    Specific Gravity, Urine 1.010  1.005 - 1.030    pH 6.5  5.0 - 8.0    Glucose,  UA NEGATIVE  NEGATIVE (mg/dL)   Hgb urine dipstick NEGATIVE  NEGATIVE    Bilirubin Urine NEGATIVE  NEGATIVE    Ketones, ur  NEGATIVE  NEGATIVE (mg/dL)   Protein, ur NEGATIVE  NEGATIVE (mg/dL)   Urobilinogen, UA 0.2  0.0 - 1.0 (mg/dL)   Nitrite NEGATIVE  NEGATIVE    Leukocytes, UA NEGATIVE  NEGATIVE   Ct Abdomen Pelvis W Contrast  12/07/2011  *RADIOLOGY REPORT*  Clinical Data: Worsening left lower quadrant abdominal pain, radiating to the back.  Nausea and weakness.  CT ABDOMEN AND PELVIS WITH CONTRAST  Technique:  Multidetector CT imaging of the abdomen and pelvis was performed following the standard protocol during bolus administration of intravenous contrast.  Contrast: OMNIPAQUE IOHEXOL 300 MG/ML  SOLN  Comparison: CT of the abdomen and pelvis performed 12/01/2011  Findings: Scattered small nodules at both lung bases appear grossly stable from prior studies, and may be post infectious in nature, though as previously suggested, follow-up chest CT may be considered in 2-8 months.  The liver and spleen are unremarkable in appearance.  The gallbladder is within normal limits.  The pancreas and adrenal glands are unremarkable.  The kidneys are unremarkable in appearance.  There is no evidence of hydronephrosis.  No renal or ureteral stones are seen.  No perinephric stranding is appreciated.  No free fluid is identified.  The small bowel is unremarkable in appearance.  The stomach is within normal limits.  No acute vascular abnormalities are seen.  The patient is status post appendectomy.  The colon is unremarkable in appearance.  The bladder is mildly distended and grossly unremarkable.  The prostate remains normal in size.  No inguinal lymphadenopathy is seen.  No acute osseous abnormalities are identified.  A prominent bone island is seen in the right iliac bone, near the sacroiliac joint.  IMPRESSION:  1.  No acute abnormalities seen within the abdomen or pelvis. 2.  Stable appearing scattered small nodules at both lung bases may be post infectious in nature, though as previously suggested, follow-up chest CT may be  considered in 2-8 months.  Original Report Authenticated By: Tonia Ghent, M.D.       MDM  Patient with history of testicular cancer status post orchiectomy with prosthetic testes, here with recurrent left lower quadrant and lower abdominal pain that radiates to the back. He was seen here for similar complaint on 12/01/2011 at which time he was admitted for observation. At that time his laboratory data was normal and a CT of the abdomen and pelvis did not show any acute findings. Again tonight his labs were normal and repeat CT of the abdomen and pelvis are unremarkable. His pain has required analgesics x3 with minimal relief. I strongly recommended admission and again for further evaluation however the patient has declined admission.Pt stable in ED with no significant deterioration in condition.The patient appears reasonably screened and/or stabilized for discharge and I doubt any other medical condition or other [facility] requiring further screening, evaluation, or treatment in the ED at this time prior to discharge.  I personally performed the services described in this documentation, which was scribed in my presence. The recorded information has been reviewed and considered.   MDM Reviewed: previous chart, nursing note and vitals Reviewed previous: labs and CT scan Interpretation: labs and CT scan       Nicoletta Dress. Colon Branch, MD 12/07/11 2956

## 2011-12-06 NOTE — ED Notes (Addendum)
Seen here on Sunday for similar sx, abd pain, Nausea,  Loose bm's.  NO fever, Has had "chills".  Has rash on lt arm , same arm that IV was in on Sunday.

## 2011-12-07 LAB — CBC
MCH: 30.4 pg (ref 26.0–34.0)
MCV: 87.3 fL (ref 78.0–100.0)
Platelets: 286 10*3/uL (ref 150–400)
RDW: 12.5 % (ref 11.5–15.5)
WBC: 8 10*3/uL (ref 4.0–10.5)

## 2011-12-07 LAB — DIFFERENTIAL
Basophils Absolute: 0 10*3/uL (ref 0.0–0.1)
Eosinophils Absolute: 0.3 10*3/uL (ref 0.0–0.7)
Eosinophils Relative: 3 % (ref 0–5)
Lymphocytes Relative: 33 % (ref 12–46)
Neutrophils Relative %: 56 % (ref 43–77)

## 2011-12-07 LAB — COMPREHENSIVE METABOLIC PANEL
ALT: 53 U/L (ref 0–53)
AST: 31 U/L (ref 0–37)
Calcium: 9.6 mg/dL (ref 8.4–10.5)
Sodium: 139 mEq/L (ref 135–145)
Total Protein: 7.6 g/dL (ref 6.0–8.3)

## 2011-12-07 LAB — URINALYSIS, ROUTINE W REFLEX MICROSCOPIC
Bilirubin Urine: NEGATIVE
Glucose, UA: NEGATIVE mg/dL
Hgb urine dipstick: NEGATIVE
Specific Gravity, Urine: 1.01 (ref 1.005–1.030)
pH: 6.5 (ref 5.0–8.0)

## 2011-12-07 MED ORDER — IOHEXOL 300 MG/ML  SOLN
100.0000 mL | Freq: Once | INTRAMUSCULAR | Status: AC | PRN
  Administered 2011-12-07: 100 mL via INTRAVENOUS

## 2011-12-07 MED ORDER — HYDROMORPHONE HCL PF 2 MG/ML IJ SOLN
2.0000 mg | Freq: Once | INTRAMUSCULAR | Status: AC
  Administered 2011-12-07: 2 mg via INTRAVENOUS
  Filled 2011-12-07: qty 1

## 2011-12-07 MED ORDER — PROMETHAZINE HCL 25 MG PO TABS: 12.5000 mg | ORAL_TABLET | Freq: Four times a day (QID) | ORAL | Status: DC | PRN

## 2011-12-07 MED ORDER — HYDROMORPHONE HCL PF 1 MG/ML IJ SOLN
1.0000 mg | Freq: Once | INTRAMUSCULAR | Status: AC
  Administered 2011-12-07: 1 mg via INTRAVENOUS
  Filled 2011-12-07: qty 1

## 2011-12-07 MED ORDER — OXYCODONE-ACETAMINOPHEN 5-325 MG PO TABS: 2.0000 | ORAL_TABLET | ORAL | Status: AC | PRN

## 2011-12-07 MED ORDER — PROMETHAZINE HCL 12.5 MG PO TABS
ORAL_TABLET | ORAL | Status: AC
  Filled 2011-12-07: qty 2

## 2011-12-07 MED ORDER — PROMETHAZINE HCL 12.5 MG PO TABS
25.0000 mg | ORAL_TABLET | Freq: Once | ORAL | Status: AC
  Administered 2011-12-07: 25 mg via ORAL

## 2011-12-07 NOTE — ED Notes (Signed)
Pt stood up to leave and became suddenly very nauseated.  Dr. Colon Branch aware, phenergan administered.

## 2011-12-07 NOTE — ED Notes (Signed)
Went into patient's room to administer dilaudid, patient stated he wanted to go home and asked if he could. States that since they cannot find out what is going on, he'd rather go home and deal with the pain than stay here. Dr. Colon Branch notified.

## 2011-12-07 NOTE — Discharge Instructions (Signed)
Your labwork, and CT of the abdomen and pelvis are normal tonight. I do not have an explanation for your pain. Use the pain medicine and nausea medicine as needed. Return to the emergency room if the pain does not improve. Followup with your doctor.  Abdominal Pain (Nonspecific) Your exam might not show the exact reason you have abdominal pain. Since there are many different causes of abdominal pain, another checkup and more tests may be needed. It is very important to follow up for lasting (persistent) or worsening symptoms. A possible cause of abdominal pain in any person who still has his or her appendix is acute appendicitis. Appendicitis is often hard to diagnose. Normal blood tests, urine tests, ultrasound, and CT scans do not completely rule out early appendicitis or other causes of abdominal pain. Sometimes, only the changes that happen over time will allow appendicitis and other causes of abdominal pain to be determined. Other potential problems that may require surgery may also take time to become more apparent. Because of this, it is important that you follow all of the instructions below. HOME CARE INSTRUCTIONS   Rest as much as possible.   Do not eat solid food until your pain is gone.   While adults or children have pain: A diet of water, weak decaffeinated tea, broth or bouillon, gelatin, oral rehydration solutions (ORS), frozen ice pops, or ice chips may be helpful.   When pain is gone in adults or children: Start a light diet (dry toast, crackers, applesauce, or white rice). Increase the diet slowly as long as it does not bother you. Eat no dairy products (including cheese and eggs) and no spicy, fatty, fried, or high-fiber foods.   Use no alcohol, caffeine, or cigarettes.   Take your regular medicines unless your caregiver told you not to.   Take any prescribed medicine as directed.   Only take over-the-counter or prescription medicines for pain, discomfort, or fever as directed  by your caregiver. Do not give aspirin to children.  If your caregiver has given you a follow-up appointment, it is very important to keep that appointment. Not keeping the appointment could result in a permanent injury and/or lasting (chronic) pain and/or disability. If there is any problem keeping the appointment, you must call to reschedule.  SEEK IMMEDIATE MEDICAL CARE IF:   Your pain is not gone in 24 hours.   Your pain becomes worse, changes location, or feels different.   You or your child has an oral temperature above 102 F (38.9 C), not controlled by medicine.   Your baby is older than 3 months with a rectal temperature of 102 F (38.9 C) or higher.   Your baby is 3 months old or younger with a rectal temperature of 100.4 F (38 C) or higher.   You have shaking chills.   You keep throwing up (vomiting) or cannot drink liquids.   There is blood in your vomit or you see blood in your bowel movements.   Your bowel movements become dark or black.   You have frequent bowel movements.   Your bowel movements stop (become blocked) or you cannot pass gas.   You have bloody, frequent, or painful urination.   You have yellow discoloration in the skin or whites of the eyes.   Your stomach becomes bloated or bigger.   You have dizziness or fainting.   You have chest or back pain.  MAKE SURE YOU:   Understand these instructions.   Will watch your  condition.   Will get help right away if you are not doing well or get worse.  Document Released: 07/22/2005 Document Revised: 07/11/2011 Document Reviewed: 06/19/2009 [facility] Patient Information 2012 [place], [place].

## 2011-12-19 ENCOUNTER — Encounter (INDEPENDENT_AMBULATORY_CARE_PROVIDER_SITE_OTHER): Payer: Self-pay | Admitting: *Deleted

## 2012-01-01 ENCOUNTER — Ambulatory Visit (INDEPENDENT_AMBULATORY_CARE_PROVIDER_SITE_OTHER): Payer: BC Managed Care – PPO | Admitting: Internal Medicine

## 2012-04-08 ENCOUNTER — Other Ambulatory Visit (HOSPITAL_BASED_OUTPATIENT_CLINIC_OR_DEPARTMENT_OTHER): Payer: BC Managed Care – PPO | Admitting: Lab

## 2012-04-08 ENCOUNTER — Ambulatory Visit (HOSPITAL_BASED_OUTPATIENT_CLINIC_OR_DEPARTMENT_OTHER): Payer: BC Managed Care – PPO | Admitting: Hematology & Oncology

## 2012-04-08 VITALS — BP 144/92 | HR 84 | Temp 98.1°F | Resp 20 | Ht 70.0 in | Wt 200.0 lb

## 2012-04-08 DIAGNOSIS — Z8547 Personal history of malignant neoplasm of testis: Secondary | ICD-10-CM

## 2012-04-08 DIAGNOSIS — R911 Solitary pulmonary nodule: Secondary | ICD-10-CM

## 2012-04-08 LAB — CBC WITH DIFFERENTIAL (CANCER CENTER ONLY)
BASO#: 0 10*3/uL (ref 0.0–0.2)
BASO%: 0.4 % (ref 0.0–2.0)
EOS%: 2.8 % (ref 0.0–7.0)
Eosinophils Absolute: 0.2 10*3/uL (ref 0.0–0.5)
HGB: 16.7 g/dL (ref 13.0–17.1)
LYMPH%: 23.8 % (ref 14.0–48.0)
MCH: 31.2 pg (ref 28.0–33.4)
MCV: 87 fL (ref 82–98)
NEUT#: 4.6 10*3/uL (ref 1.5–6.5)
NEUT%: 64.6 % (ref 40.0–80.0)
RBC: 5.36 10*6/uL (ref 4.20–5.70)

## 2012-04-08 NOTE — Progress Notes (Signed)
This office note has been dictated.

## 2012-04-09 NOTE — Progress Notes (Signed)
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  CURRENT THERAPY:  Observation.  INTERIM HISTORY:  Scott Moss comes in for his 91-month followup. Unfortunately, his sister passed away in 12-10-2022 from myeloma.  She was only 38 years old.  Scott Moss is working quite a bit.  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  He is doing this without any difficulty.  When we last saw him, his beta-hCG was less than 0.5.  His alpha- fetoprotein was 2.6.  He has had no problems with nausea or vomiting.  There has been no abdominal pain.  He has had no cough or shortness of breath.  I think that he is still smoking a little bit.  PHYSICAL EXAMINATION:  General:  This is a well-developed, well- nourished white gentleman in no obvious distress.  Vital Signs: Temperature 98.1, pulse 84, respiratory rate 20, blood pressure 144/92, weight is 200 pounds.  Head and Neck Exam:  Shows a normocephalic, atraumatic skull.  There are no ocular or oral lesions.  There are no palpable cervical or supraclavicular lymph nodes.  Lungs:  Clear bilaterally.  Cardiac Exam:  Regular rate and rhythm with a normal S1 and S2.  There are no murmurs, rubs, or bruits.  Abdominal Exam:  Soft with good bowel sounds.  There is no palpable abdominal mass.  No palpable hepatosplenomegaly.  He has a well-healed left inguinal orchiectomy scar.  Extremities:  Show no clubbing, cyanosis, or edema. Skin Exam:  Shows no rashes, ecchymosis, or petechia.  LABORATORY STUDIES:  White cell count is 7.2, hemoglobin 16.7, hematocrit 46.6, platelet count 326.  IMPRESSION:  Scott Moss is a 36 year old gentleman with a history of a stage I nonseminomatous germ cell tumor of the left testicle.  He received 2 cycles of "adjuvant" chemotherapy with BEP.  He is doing quite well with respect to this.  He completed treatments several years ago.  He did have a followup  CT scan back in May.  The CT scan did not show any evidence of recurrent disease.  Of course, the radiologist pointed out that there are some "small scattered nodules" at the lung bases. They wanted [place] to do another CT scan on him.  I do not think we have to do this, given his normal tumor markers.  We will get him back in 6 more months for followup.    ______________________________ Josph Macho, M.D. PRE/MEDQ  D:  04/08/2012  T:  04/09/2012  Job:  [id number]

## 2012-04-10 ENCOUNTER — Telehealth: Payer: Self-pay | Admitting: *Deleted

## 2012-04-10 NOTE — Telephone Encounter (Signed)
Called patient to let him know that his labwork looked good per dr. Myna Hidalgo

## 2012-04-10 NOTE — Telephone Encounter (Signed)
Message copied by Anselm Jungling on Fri Apr 10, 2012 12:59 PM ------      Message from: Arlan Organ R      Created: Wed Apr 08, 2012  9:22 PM       Call - labs are good!!  STOP smoking!!!  [name]

## 2012-04-11 LAB — COMPREHENSIVE METABOLIC PANEL
ALT: 37 U/L (ref 0–53)
AST: 27 U/L (ref 0–37)
CO2: 23 mEq/L (ref 19–32)
Chloride: 106 mEq/L (ref 96–112)
Creatinine, Ser: 1.05 mg/dL (ref 0.50–1.35)
Sodium: 140 mEq/L (ref 135–145)
Total Bilirubin: 0.4 mg/dL (ref 0.3–1.2)
Total Protein: 7.4 g/dL (ref 6.0–8.3)

## 2012-04-11 LAB — BETA HCG QUANT (REF LAB): Beta hCG, Tumor Marker: <0.5 m[IU]/mL (ref <5.0)

## 2012-04-11 LAB — TESTOSTERONE: Testosterone: 297.52 ng/dL — ABNORMAL LOW (ref 300–890)

## 2012-08-05 HISTORY — PX: NASAL SINUS SURGERY: SHX719

## 2012-09-05 DIAGNOSIS — M94262 Chondromalacia, left knee: Secondary | ICD-10-CM

## 2012-09-05 HISTORY — DX: Chondromalacia, left knee: M94.262

## 2012-09-10 ENCOUNTER — Encounter (HOSPITAL_BASED_OUTPATIENT_CLINIC_OR_DEPARTMENT_OTHER): Payer: Self-pay | Admitting: *Deleted

## 2012-09-15 NOTE — H&P (Signed)
[facility] [address]   [address] [place], [place] [address] [phone] A Division of [facility]  Loreta Ave, M.D.   Robert A. Thurston Hole, M.D.   Burnell Blanks, M.D.   Eulas Post, M.D.   Lunette Stands, M.D Buford Dresser, M.D.  Charlsie Quest, M.D.   Estell Harpin, M.D.   Melina Fiddler, M.D. Genene Churn. Barry Dienes, PA-C            Kirstin A. Shepperson, PA-C Josh Westfield, PA-C [place], [place]   RE: Scott, Moss                                [id number]      DOB: 01-Mar-1976 PROGRESS NOTE: 08-14-12 Scott Moss comes in for his left knee.  Longstanding steadily worsening symptoms in his left knee.  Going on for a number of months.  Worse over the last seven days.  He was seen and evaluated at Urgent Care on December 29th.  At that time x-rays were obtained showing no acute bony abnormalities.  Relatively well preserved joint space.  Felt to have significant retropatellar irritation and possible medial meniscus tear.  He had not improved with anti-inflammatories and modification of activity.  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  When he is immobile for a long period of time he gets a lot of stiffness.  Catching, buckling and giving way.  Intraarticular injection helpful for a short time, but didn't last.   This is the same knee I looked at back in 2012, which was when he really had his acute onset of symptoms.  Treated conservatively at that time, including anti-inflammatories and eventual MRI scan was completed in August of 2012.  This showed a picture of chondromalacia, lateral tracking and tethering, but no discreet meniscal tears.  He elected to try to tolerate the symptoms, but it has now become completely intolerable.  A lot more in the way of medial, as well as retropatellar symptoms.   He has also been treated in the past by me with arthroscopic assessment, chondroplasty, plica excision and repair of  chronic patellar tendinosis in his opposite right knee back in 2008.  Although he struggled with some symptoms post-op, he has had complete resolution of symptoms there and is doing well.   Remaining history is reviewed, updated and included in the chart.  I have also gone over his recent workup, x-rays and treatment from Urgent Care.  I met with he and his wife today.    EXAMINATION: General exam is outlined and included in the chart.  Specifically, in the symptomatic left knee he has patellar grind, lateral tracking and some tethering, symptomatic medial plica.  No quad or patellar tendonitis.  Very tender medial joint line.  Pain with McMurray's.  Extensor mechanism is intact.  Ligaments stable.  Trace effusion.  Opposite right knee has a well healed incision.  No tenderness.  Not a lot of retropatellar symptoms.  No meniscal signs.  Neurovascularly intact.  distally.   DISPOSITION:  I met with [place] and his wife spending more than 25 minutes face-to-face covering workup and treatment to date.  He now has had symptoms off and on going back for almost two years.  Further conservative treatment I think is going to be futile.  We discussed definitive treatment.  We talked about whether or not to get  another MRI, which I don't think is necessary and they understand and agree.  Plan exam under anesthesia, arthroscopy.  Chondroplasty, plica excision and assessment of his meniscus.  Also look at his patella tracking and adding a lateral release if indicated.  Procedures, risks, benefits and complications reviewed in detail with patient and his wife and they understand.  Paperwork complete.  All questions answered.  See him at the time of operative intervention.  Based on his job and depending on whether or not we add a lateral release, out of work for at least six weeks post-op.

## 2012-09-18 ENCOUNTER — Ambulatory Visit (HOSPITAL_BASED_OUTPATIENT_CLINIC_OR_DEPARTMENT_OTHER)
Admission: RE | Admit: 2012-09-18 | Discharge: 2012-09-18 | Disposition: A | Discharge status: Home / Self Care | Payer: BC Managed Care – PPO | Source: Ambulatory Visit | Attending: Orthopedic Surgery | Admitting: Orthopedic Surgery

## 2012-09-18 ENCOUNTER — Encounter (HOSPITAL_BASED_OUTPATIENT_CLINIC_OR_DEPARTMENT_OTHER)
Admission: RE | Disposition: A | Discharge status: Home / Self Care | Payer: Self-pay | Source: Ambulatory Visit | Attending: Orthopedic Surgery

## 2012-09-18 ENCOUNTER — Encounter (HOSPITAL_BASED_OUTPATIENT_CLINIC_OR_DEPARTMENT_OTHER): Payer: Self-pay | Admitting: *Deleted

## 2012-09-18 ENCOUNTER — Ambulatory Visit (HOSPITAL_BASED_OUTPATIENT_CLINIC_OR_DEPARTMENT_OTHER): Payer: BC Managed Care – PPO | Admitting: *Deleted

## 2012-09-18 DIAGNOSIS — M659 Unspecified synovitis and tenosynovitis, unspecified site: Secondary | ICD-10-CM | POA: Insufficient documentation

## 2012-09-18 DIAGNOSIS — Z9889 Other specified postprocedural states: Secondary | ICD-10-CM

## 2012-09-18 DIAGNOSIS — M2240 Chondromalacia patellae, unspecified knee: Secondary | ICD-10-CM | POA: Insufficient documentation

## 2012-09-18 HISTORY — PX: KNEE ARTHROSCOPY WITH LATERAL RELEASE: SHX5649

## 2012-09-18 HISTORY — DX: Chondromalacia, left knee: M94.262

## 2012-09-18 SURGERY — ARTHROSCOPY, KNEE, WITH LATERAL RETINACULUM RELEASE
Anesthesia: General | Site: Knee | Laterality: Left | Wound class: Clean

## 2012-09-18 MED ORDER — LIDOCAINE HCL (CARDIAC) 20 MG/ML IV SOLN
INTRAVENOUS | Status: DC | PRN
  Administered 2012-09-18: 60 mg via INTRAVENOUS

## 2012-09-18 MED ORDER — FENTANYL CITRATE 0.05 MG/ML IJ SOLN: 50.0000 ug | INTRAMUSCULAR | Status: DC | PRN

## 2012-09-18 MED ORDER — MIDAZOLAM HCL 2 MG/2ML IJ SOLN: 1.0000 mg | INTRAMUSCULAR | Status: DC | PRN

## 2012-09-18 MED ORDER — FENTANYL CITRATE 0.05 MG/ML IJ SOLN
INTRAMUSCULAR | Status: DC | PRN
  Administered 2012-09-18: 100 ug via INTRAVENOUS
  Administered 2012-09-18: 50 ug via INTRAVENOUS

## 2012-09-18 MED ORDER — DEXAMETHASONE SODIUM PHOSPHATE 4 MG/ML IJ SOLN
INTRAMUSCULAR | Status: DC | PRN
  Administered 2012-09-18: 10 mg via INTRAVENOUS

## 2012-09-18 MED ORDER — OXYCODONE HCL 5 MG PO TABS: 5.0000 mg | ORAL_TABLET | Freq: Once | ORAL | Status: DC | PRN

## 2012-09-18 MED ORDER — ACETAMINOPHEN 10 MG/ML IV SOLN
1000.0000 mg | Freq: Once | INTRAVENOUS | Status: AC
  Administered 2012-09-18: 1000 mg via INTRAVENOUS

## 2012-09-18 MED ORDER — SODIUM CHLORIDE 0.9 % IR SOLN
Status: DC | PRN
  Administered 2012-09-18: 3000 mL

## 2012-09-18 MED ORDER — OXYCODONE-ACETAMINOPHEN 5-325 MG PO TABS: 1.0000 | ORAL_TABLET | ORAL | Status: DC | PRN

## 2012-09-18 MED ORDER — CEFAZOLIN SODIUM-DEXTROSE 2-3 GM-% IV SOLR
2.0000 g | INTRAVENOUS | Status: AC
  Administered 2012-09-18: 2 g via INTRAVENOUS

## 2012-09-18 MED ORDER — LACTATED RINGERS IV SOLN
INTRAVENOUS | Status: DC
  Administered 2012-09-18 (×2): via INTRAVENOUS

## 2012-09-18 MED ORDER — MIDAZOLAM HCL 5 MG/5ML IJ SOLN
INTRAMUSCULAR | Status: DC | PRN
  Administered 2012-09-18: 2 mg via INTRAVENOUS

## 2012-09-18 MED ORDER — OXYCODONE HCL 5 MG/5ML PO SOLN: 5.0000 mg | Freq: Once | ORAL | Status: DC | PRN

## 2012-09-18 MED ORDER — PROPOFOL 10 MG/ML IV BOLUS
INTRAVENOUS | Status: DC | PRN
  Administered 2012-09-18: 200 mg via INTRAVENOUS

## 2012-09-18 MED ORDER — METHYLPREDNISOLONE ACETATE 80 MG/ML IJ SUSP
INTRAMUSCULAR | Status: DC | PRN
  Administered 2012-09-18: 14:00:00 via INTRA_ARTICULAR

## 2012-09-18 MED ORDER — ONDANSETRON HCL 4 MG/2ML IJ SOLN: INTRAMUSCULAR | Status: DC | PRN

## 2012-09-18 MED ORDER — HYDROMORPHONE HCL PF 1 MG/ML IJ SOLN
0.2500 mg | INTRAMUSCULAR | Status: DC | PRN
  Administered 2012-09-18 (×2): 0.5 mg via INTRAVENOUS

## 2012-09-18 MED ORDER — MIDAZOLAM HCL 2 MG/ML PO SYRP: 12.0000 mg | ORAL_SOLUTION | Freq: Once | ORAL | Status: DC | PRN

## 2012-09-18 MED ORDER — ONDANSETRON HCL 4 MG/2ML IJ SOLN
INTRAMUSCULAR | Status: DC | PRN
  Administered 2012-09-18: 4 mg via INTRAVENOUS

## 2012-09-18 SURGICAL SUPPLY — 42 items
BANDAGE ELASTIC 6 VELCRO ST LF (GAUZE/BANDAGES/DRESSINGS) ×2 IMPLANT
BLADE CUDA 5.5 (BLADE) IMPLANT
BLADE CUDA GRT WHITE 3.5 (BLADE) ×1 IMPLANT
BLADE CUTTER GATOR 3.5 (BLADE) ×2 IMPLANT
BLADE CUTTER MENIS 5.5 (BLADE) IMPLANT
BLADE GREAT WHITE 4.2 (BLADE) ×2 IMPLANT
BUR OVAL 4.0 (BURR) IMPLANT
CANISTER OMNI JUG 16 LITER (MISCELLANEOUS) ×2 IMPLANT
CANISTER SUCTION 2500CC (MISCELLANEOUS) IMPLANT
CLOTH BEACON ORANGE TIMEOUT ST (SAFETY) ×2 IMPLANT
CUTTER MENISCUS  4.2MM (BLADE)
CUTTER MENISCUS 4.2MM (BLADE) IMPLANT
DRAPE ARTHROSCOPY W/POUCH 90 (DRAPES) ×2 IMPLANT
DRSG PAD ABDOMINAL 8X10 ST (GAUZE/BANDAGES/DRESSINGS) ×1 IMPLANT
DURAPREP 26ML APPLICATOR (WOUND CARE) ×2 IMPLANT
ELECT MENISCUS 165MM 90D (ELECTRODE) ×1 IMPLANT
ELECT REM PT RETURN 9FT ADLT (ELECTROSURGICAL) ×2
ELECTRODE REM PT RTRN 9FT ADLT (ELECTROSURGICAL) IMPLANT
GAUZE XEROFORM 1X8 LF (GAUZE/BANDAGES/DRESSINGS) ×2 IMPLANT
GLOVE BIO SURGEON STRL SZ7 (GLOVE) ×1 IMPLANT
GLOVE BIOGEL PI IND STRL 7.0 (GLOVE) IMPLANT
GLOVE BIOGEL PI IND STRL 8 (GLOVE) ×1 IMPLANT
GLOVE BIOGEL PI INDICATOR 7.0 (GLOVE) ×1
GLOVE BIOGEL PI INDICATOR 8 (GLOVE) ×1
GLOVE ORTHO TXT STRL SZ7.5 (GLOVE) ×4 IMPLANT
GOWN PREVENTION PLUS XLARGE (GOWN DISPOSABLE) ×3 IMPLANT
GOWN STRL REIN 2XL XLG LVL4 (GOWN DISPOSABLE) ×2 IMPLANT
HOLDER KNEE FOAM BLUE (MISCELLANEOUS) ×2 IMPLANT
IMMOBILIZER KNEE 24 THIGH 36 (MISCELLANEOUS) IMPLANT
IMMOBILIZER KNEE 24 UNIV (MISCELLANEOUS) ×2
IV NS IRRIG 3000ML ARTHROMATIC (IV SOLUTION) ×6 IMPLANT
KNEE WRAP E Z 3 GEL PACK (MISCELLANEOUS) ×1 IMPLANT
PACK ARTHROSCOPY DSU (CUSTOM PROCEDURE TRAY) ×2 IMPLANT
PACK BASIN DAY SURGERY FS (CUSTOM PROCEDURE TRAY) ×2 IMPLANT
PENCIL BUTTON HOLSTER BLD 10FT (ELECTRODE) ×1 IMPLANT
SET ARTHROSCOPY TUBING (MISCELLANEOUS) ×2
SET ARTHROSCOPY TUBING LN (MISCELLANEOUS) ×1 IMPLANT
SPONGE GAUZE 4X4 12PLY (GAUZE/BANDAGES/DRESSINGS) ×4 IMPLANT
SUT ETHILON 3 0 PS 1 (SUTURE) ×2 IMPLANT
SUT VIC AB 3-0 FS2 27 (SUTURE) IMPLANT
TOWEL OR 17X24 6PK STRL BLUE (TOWEL DISPOSABLE) ×2 IMPLANT
WATER STERILE IRR 1000ML POUR (IV SOLUTION) ×2 IMPLANT

## 2012-09-18 NOTE — Brief Op Note (Signed)
09/18/2012  2:13 PM  PATIENT:  Scott Moss  37 y.o. male  PRE-OPERATIVE DIAGNOSIS:  LEFT KNEE CHONDROMALACIA PATELLA  POST-OPERATIVE DIAGNOSIS:  LEFT KNEE CHONDROMALACIA PATELLA  PROCEDURE:  Procedure(s) with comments: KNEE ARTHROSCOPY WITH LATERAL RELEASE (Left) - WITH DEBRIDEMENT AND SHAVING(CHONDROPLASTY), Excision Medial Plica  SURGEON:  Surgeon(s) and Role:    * Loreta Ave, MD - Primary  PHYSICIAN ASSISTANT: Zonia Kief M   ANESTHESIA:   general  EBL:  Total I/O In: 1200 [I.V.:1200] Out: -   BLOOD ADMINISTERED:none   SPECIMEN:  No Specimen  DISPOSITION OF SPECIMEN:  N/A  COUNTS:  YES  TOURNIQUET:  * No tourniquets in log *  PATIENT DISPOSITION:  PACU - hemodynamically stable.

## 2012-09-18 NOTE — Anesthesia Procedure Notes (Signed)
Procedure Name: LMA Insertion Date/Time: 09/18/2012 12:54 PM Performed by: Meyer Russel Pre-anesthesia Checklist: Patient identified, Emergency Drugs available, Suction available and Patient being monitored Patient Re-evaluated:Patient Re-evaluated prior to inductionOxygen Delivery Method: Circle System Utilized Preoxygenation: Pre-oxygenation with 100% oxygen Intubation Type: IV induction Ventilation: Mask ventilation without difficulty LMA: LMA inserted LMA Size: 5.0 Number of attempts: 1 Airway Equipment and Method: bite block Placement Confirmation: positive ETCO2 and breath sounds checked- equal and bilateral Tube secured with: Tape Dental Injury: Teeth and Oropharynx as per pre-operative assessment

## 2012-09-18 NOTE — Transfer of Care (Signed)
Immediate Anesthesia Transfer of Care Note  Patient: Scott Moss  Procedure(s) Performed: Procedure(s) with comments: KNEE ARTHROSCOPY WITH LATERAL RELEASE (Left) - WITH DEBRIDEMENT AND SHAVING(CHONDROPLASTY), Excision Medial Plica  Patient Location: PACU  Anesthesia Type:General  Level of Consciousness: awake and responds to stimulation  Airway & Oxygen Therapy: Patient Spontanous Breathing and Patient connected to face mask oxygen  Post-op Assessment: Report given to PACU RN and Post -op Vital signs reviewed and stable  Post vital signs: Reviewed and stable  Complications: No apparent anesthesia complications

## 2012-09-18 NOTE — Interval H&P Note (Signed)
History and Physical Interval Note:  09/18/2012 10:14 AM  Scott Moss  has presented today for surgery, with the diagnosis of LEFT KNEE CHONDROMALACIA PATELLA  The various methods of treatment have been discussed with the patient and family. After consideration of risks, benefits and other options for treatment, the patient has consented to  Procedure(s) with comments: KNEE ARTHROSCOPY WITH LATERAL RELEASE (Left) - WITH DEBRIDEMENT AND SHAVING(CHONDROPLASTY) as a surgical intervention .  The patient's history has been reviewed, patient examined, no change in status, stable for surgery.  I have reviewed the patient's chart and labs.  Questions were answered to the patient's satisfaction.     [name]

## 2012-09-18 NOTE — Anesthesia Postprocedure Evaluation (Signed)
  Anesthesia Post-op Note  Patient: Scott Moss  Procedure(s) Performed: Procedure(s) with comments: KNEE ARTHROSCOPY WITH LATERAL RELEASE (Left) - WITH DEBRIDEMENT AND SHAVING(CHONDROPLASTY), Excision Medial Plica  Patient Location: PACU  Anesthesia Type:General  Level of Consciousness: awake, alert  and oriented  Airway and Oxygen Therapy: Patient Spontanous Breathing  Post-op Pain: mild  Post-op Assessment: Post-op Vital signs reviewed, Patient's Cardiovascular Status Stable, Respiratory Function Stable, Patent Airway and No signs of Nausea or vomiting  Post-op Vital Signs: Reviewed and stable  Complications: No apparent anesthesia complications

## 2012-09-18 NOTE — Anesthesia Preprocedure Evaluation (Addendum)
Anesthesia Evaluation  Patient identified by MRN, date of birth, ID band Patient awake    Reviewed: Allergy & Precautions, H&P , NPO status , Patient's Chart, lab work & pertinent test results  Airway Mallampati: II TM Distance: >3 FB Neck ROM: Full    Dental no notable dental hx. (+) Teeth Intact and Dental Advisory Given   Pulmonary neg pulmonary ROS,  breath sounds clear to auscultation  Pulmonary exam normal       Cardiovascular negative cardio ROS  Rhythm:Regular Rate:Normal     Neuro/Psych negative neurological ROS  negative psych ROS   GI/Hepatic negative GI ROS, Neg liver ROS,   Endo/Other  negative endocrine ROS  Renal/GU negative Renal ROS  negative genitourinary   Musculoskeletal   Abdominal   Peds  Hematology negative hematology ROS (+)   Anesthesia Other Findings   Reproductive/Obstetrics negative OB ROS                           Anesthesia Physical Anesthesia Plan  ASA: II  Anesthesia Plan: General   Post-op Pain Management:    Induction: Intravenous  Airway Management Planned: LMA  Additional Equipment:   Intra-op Plan:   Post-operative Plan: Extubation in OR  Informed Consent: I have reviewed the patients History and Physical, chart, labs and discussed the procedure including the risks, benefits and alternatives for the proposed anesthesia with the patient or authorized representative who has indicated his/her understanding and acceptance.   Dental advisory given  Plan Discussed with: CRNA  Anesthesia Plan Comments:         Anesthesia Quick Evaluation  

## 2012-09-21 ENCOUNTER — Encounter (HOSPITAL_BASED_OUTPATIENT_CLINIC_OR_DEPARTMENT_OTHER): Payer: Self-pay | Admitting: Orthopedic Surgery

## 2012-09-21 NOTE — Op Note (Signed)
Scott Moss, Scott Moss               ACCOUNT NO.:  [id number]  MEDICAL RECORD NO.:  [id number]  LOCATION:                               FACILITY:  [facility]  PHYSICIAN:  Loreta Ave, M.D. DATE OF BIRTH:  Jan 11, 1976  DATE OF PROCEDURE:  09/18/2012 DATE OF DISCHARGE:  09/18/2012                              OPERATIVE REPORT   PREOPERATIVE DIAGNOSES:  Left knee chondromalacia patella, lateral tracking and tethering.  Medial plica versus medial meniscus tear.  POSTOPERATIVE DIAGNOSES:  Left knee chondromalacia patella, lateral tracking and tethering.  Medial plica versus medial meniscus tear with medial plica synovitis without meniscus tear.  Grade 3 chondromalacia lateral patella and medial border of the patella from the plica.  PROCEDURE:  Left knee exam under anesthesia, arthroscopy.  Chondroplasty patella.  Excised medial plica, partial synovectomy.  Arthroscopic lateral retinacular release.  SURGEON:  Loreta Ave, M.D.  ASSISTANT:  Genene Churn. Barry Dienes, [place].  ANESTHESIA:  General.  BLOOD LOSS:  Minimal.  SPECIMENS:  None.  CULTURES:  None.  COMPLICATION:  None.  DRESSING:  Soft compressive.  Lateral bolster.  TOURNIQUET:  Not employed.  DESCRIPTION OF PROCEDURE:  The patient was brought to the operating room, placed on the operating table in supine position.  After adequate anesthesia had been obtained, knee examined.  Full motion.  Lateral tracking, tethering patellofemoral joint.  Stable ligaments.  Leg holder applied.  Leg prepped and draped in usual sterile fashion.  Two portals, one each medial and lateral parapatellar.  Arthroscope was introduced. Knee distended and inspected.  Lateral tracking and tethering confirmed. Grade 2 and 3 chondromalacia lateral patella from lateral overload. Chondroplasty to a stable surface.  Fraying of the medial border where there was synovitis medial plica.  Chondroplasty there as well.  Plica excised, hypertrophic synovitis  excised.  Trochlea otherwise looked good.  Cruciate ligaments medial meniscus, lateral meniscus, medial and lateral compartments looked good.  Looked tracking on both sides. Sufficient tethering to warrant release.  With the cautery laterally release on the vastus lateralis posterior border above down lateral joint-line inferiorly.  Hemostasis with cautery.  Marked improved with a tethering after release.  Instruments were removed.  Portals were closed with nylon.  Knee injected with Depo-Medrol and Marcaine. Sterile compressive dressing applied.  Lateral bolster.  Anesthesia reversed.  Brought to the recovery room.  Tolerated surgery well.  No complications.     Loreta Ave, M.D.     DFM/MEDQ  D:  09/18/2012  T:  09/18/2012  Job:  [id number]

## 2012-09-22 ENCOUNTER — Telehealth: Payer: Self-pay | Admitting: Hematology & Oncology

## 2012-09-22 NOTE — Telephone Encounter (Signed)
Patient rescheduled to see Eunice Blase due to Dr Myna Hidalgo being out of the office.  Spoke to patient and informed him of new appointment on 10/08/12 at 9:45am.  Patient confirmed this time and date was ok.

## 2012-10-07 ENCOUNTER — Other Ambulatory Visit: Payer: BC Managed Care – PPO | Admitting: Lab

## 2012-10-07 ENCOUNTER — Other Ambulatory Visit: Payer: Self-pay | Admitting: Medical

## 2012-10-07 ENCOUNTER — Ambulatory Visit: Payer: BC Managed Care – PPO | Admitting: Hematology & Oncology

## 2012-10-07 DIAGNOSIS — Z8547 Personal history of malignant neoplasm of testis: Secondary | ICD-10-CM

## 2012-10-08 ENCOUNTER — Other Ambulatory Visit (HOSPITAL_BASED_OUTPATIENT_CLINIC_OR_DEPARTMENT_OTHER): Payer: BC Managed Care – PPO | Admitting: Lab

## 2012-10-08 ENCOUNTER — Ambulatory Visit (HOSPITAL_BASED_OUTPATIENT_CLINIC_OR_DEPARTMENT_OTHER): Payer: BC Managed Care – PPO | Admitting: Medical

## 2012-10-08 VITALS — BP 129/87 | HR 79 | Temp 97.8°F | Resp 18 | Ht 69.0 in | Wt 219.0 lb

## 2012-10-08 DIAGNOSIS — C6290 Malignant neoplasm of unspecified testis, unspecified whether descended or undescended: Secondary | ICD-10-CM

## 2012-10-08 DIAGNOSIS — Z8547 Personal history of malignant neoplasm of testis: Secondary | ICD-10-CM

## 2012-10-08 LAB — CBC WITH DIFFERENTIAL (CANCER CENTER ONLY)
BASO#: 0.1 10*3/uL (ref 0.0–0.2)
HCT: 41.5 % (ref 38.7–49.9)
HGB: 14.3 g/dL (ref 13.0–17.1)
LYMPH#: 1.6 10*3/uL (ref 0.9–3.3)
MONO#: 0.6 10*3/uL (ref 0.1–0.9)
NEUT%: 62.8 % (ref 40.0–80.0)
WBC: 6.6 10*3/uL (ref 4.0–10.0)

## 2012-10-08 NOTE — Progress Notes (Signed)
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  CURRENT THERAPY:  Observation.  INTERIM HISTORY:  Scott Moss presents today for an office followup visit.  Overall, he is doing quite well.  Back on 09/18/2012.  He did have a left knee.  Arthroscopic.  He is recovering nicely from that.  He continues to have physical therapy.  He still continues to work.  He works for [employer].  When we saw him back in September.  His AFP tumor marker was 2.7, and beta hCG was less than 0.5.  He, reports, a good appetite.  He denies any nausea, vomiting, diarrhea, constipation, chest pain, shortness of breath, or cough.  He denies any fevers, chills, or night sweats.  He denies any lower leg swelling.  He denies any obvious, or abnormal bleeding or bruising.  He denies any abdominal pain.  He denies any headaches, visual changes, or rashes.  Review of Systems: Constitutional:Negative for malaise/fatigue, fever, chills, weight loss, diaphoresis, activity change, appetite change, and unexpected weight change.  HEENT: Negative for double vision, blurred vision, visual loss, ear pain, tinnitus, congestion, rhinorrhea, epistaxis sore throat or sinus disease, oral pain/lesion, tongue soreness Respiratory: Negative for cough, chest tightness, shortness of breath, wheezing and stridor.  Cardiovascular: Negative for chest pain, palpitations, leg swelling, orthopnea, PND, DOE or claudication Gastrointestinal: Negative for nausea, vomiting, abdominal pain, diarrhea, constipation, blood in stool, melena, hematochezia, abdominal distention, anal bleeding, rectal pain, anorexia and hematemesis.  Genitourinary: Negative for dysuria, frequency, hematuria,  Musculoskeletal: Negative for myalgias, back pain, joint swelling, arthralgias and gait problem.  Skin: Negative for rash, color change, pallor and wound.  Neurological:. Negative for dizziness/light-headedness, tremors, seizures, syncope,  facial asymmetry, speech difficulty, weakness, numbness, headaches and paresthesias.  Hematological: Negative for adenopathy. Does not bruise/bleed easily.  Psychiatric/Behavioral:  Negative for depression, no loss of interest in normal activity or change in sleep pattern.   Physical Exam: This is a pleasant, 37 year old, well-developed, well-nourished, white gentleman, in no obvious distress Vitals: Temperature 97.8 degrees, pulse 79, respirations 18, blood pressure 129/87, weight 219 pounds HEENT reveals a normocephalic, atraumatic skull, no scleral icterus, no oral lesions  Neck is supple without any cervical or supraclavicular adenopathy.  Lungs are clear to auscultation bilaterally. There are no wheezes, rales or rhonci Cardiac is regular rate and rhythm with a normal S1 and S2. There are no murmurs, rubs, or bruits.  Abdomen is soft with good bowel sounds, there is no palpable mass. There is no palpable hepatosplenomegaly. There is no palpable fluid wave.  Musculoskeletal no tenderness of the spine, ribs, or hips.  Extremities there are no clubbing, cyanosis, or edema.  Skin no petechia, purpura or ecchymosis Neurologic is nonfocal.  Laboratory Data: White count 6.6, hemoglobin 14.3, hematocrit 41.5.  Platelets 292,000  Current Outpatient Prescriptions on File Prior to Visit  Medication Sig Dispense Refill  . oxyCODONE-acetaminophen (PERCOCET) 5-325 MG per tablet Take 1-2 tablets by mouth every 4 (four) hours as needed for pain.  30 tablet  0   No current facility-administered medications on file prior to visit.   Assessment/Plan: This is a pleasant, 37 year old, gentleman, with the following issues:  #1.  History of stage I nonseminomatous germ cell tumor of the left testicle.  He received 2 cycles of adjuvant chemotherapy with BEP.  He completed treatment back in 2006.  His last imaging CT scans was back in May, which showed no evidence of any recurrent disease.  Overall, he is  doing quite well without any evidence of recurrence.  We will continue to follow him every 6 months.  #2.  Followup.  We will follow back up with Scott Moss in 6 months, but before then should there be questions or concerns.

## 2012-10-14 LAB — BETA HCG QUANT (REF LAB): Beta hCG, Tumor Marker: <0.5 m[IU]/mL (ref <5.0)

## 2012-10-16 ENCOUNTER — Telehealth: Payer: Self-pay | Admitting: *Deleted

## 2012-10-16 NOTE — Telephone Encounter (Addendum)
Message copied by Mirian Capuchin on Fri Oct 16, 2012 12:11 PM ------      Message from: Arlan Organ R      Created: Thu Oct 15, 2012  5:06 PM       Call - labs look good!! [name] ------This message left on pt's home answering machine.

## 2013-04-08 ENCOUNTER — Telehealth: Payer: Self-pay | Admitting: Hematology & Oncology

## 2013-04-08 NOTE — Telephone Encounter (Signed)
Faxed Medical Records via fax today  to:  [facility] [address] [place]  [address] Ph: [phone]  [phone] Fx: [phone]  Medical  Records requested recent ofc visit and labs   CONSENT COPY SCANNED

## 2013-04-09 ENCOUNTER — Other Ambulatory Visit (HOSPITAL_BASED_OUTPATIENT_CLINIC_OR_DEPARTMENT_OTHER): Payer: BC Managed Care – PPO | Admitting: Lab

## 2013-04-09 ENCOUNTER — Ambulatory Visit (HOSPITAL_BASED_OUTPATIENT_CLINIC_OR_DEPARTMENT_OTHER): Payer: BC Managed Care – PPO | Admitting: Hematology & Oncology

## 2013-04-09 VITALS — BP 139/81 | HR 83 | Temp 97.7°F | Resp 18 | Ht 69.0 in | Wt 214.0 lb

## 2013-04-09 DIAGNOSIS — Z8547 Personal history of malignant neoplasm of testis: Secondary | ICD-10-CM

## 2013-04-09 DIAGNOSIS — E785 Hyperlipidemia, unspecified: Secondary | ICD-10-CM

## 2013-04-09 LAB — CBC WITH DIFFERENTIAL (CANCER CENTER ONLY)
BASO#: 0 10*3/uL (ref 0.0–0.2)
BASO%: 0.5 % (ref 0.0–2.0)
EOS%: 3.5 % (ref 0.0–7.0)
Eosinophils Absolute: 0.2 10*3/uL (ref 0.0–0.5)
HCT: 43.9 % (ref 38.7–49.9)
HGB: 15.2 g/dL (ref 13.0–17.1)
LYMPH#: 1.8 10*3/uL (ref 0.9–3.3)
LYMPH%: 28.8 % (ref 14.0–48.0)
MCH: 30.6 pg (ref 28.0–33.4)
MCHC: 34.6 g/dL (ref 32.0–35.9)
MCV: 88 fL (ref 82–98)
MONO#: 0.6 10*3/uL (ref 0.1–0.9)
MONO%: 9.9 % (ref 0.0–13.0)
NEUT#: 3.5 10*3/uL (ref 1.5–6.5)
NEUT%: 57.3 % (ref 40.0–80.0)
Platelets: 298 10*3/uL (ref 145–400)
RBC: 4.97 10*6/uL (ref 4.20–5.70)
RDW: 12.2 % (ref 11.1–15.7)
WBC: 6.1 10*3/uL (ref 4.0–10.0)

## 2013-04-09 NOTE — Progress Notes (Signed)
This office note has been dictated.

## 2013-04-10 NOTE — Progress Notes (Signed)
CC:   Scott Moss. Eloise Harman, M.D.  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  CURRENT THERAPY:  Observation.  INTERIM HISTORY:  Scott Moss comes in for followup.  We have been seeing him every 6 months.  I think we can probably get him back yearly now. If I am not mistaken, it has been 8 years since he completed treatments.  He has had no problems since we last saw him.  He has not had any primary care followup.  We will have to see about getting him a family doctor.  We will see if Dr. Eloise Harman of [facility] can see him.  He has had no problems with change in bowel or bladder habits.  He is not smoking.  He has had no cough.  He has had no fevers, sweats, or chills.  He has had no rashes.  His last lab work that we did back in March of this year showed his LDH to be normal at 225.  Beta HCG was less than 0.5, and alpha-fetoprotein was 2.5.  PHYSICAL EXAMINATION:  General:  This is a well-developed, well- nourished white gentleman in no obvious distress.  Vital signs: Temperature of 97.7, pulse 83, respiratory rate 18, blood pressure 137/81.  Weight is 214 pounds.  Head and neck:  Normocephalic, atraumatic skull.  There are no ocular or oral lesions.  There are no palpable cervical or supraclavicular lymph nodes.  Lungs:  Clear bilaterally.  Cardiac:  Regular rate and rhythm with a normal S1 and S2. There are no murmurs, rubs or bruits.  Abdomen:  Soft.  He has good bowel sounds.  There is no fluid wave.  There is a well-healed left inguinal orchiectomy scar.  Back:  No tenderness over the spine, ribs, or hips.  Extremities:  Show no clubbing, cyanosis or edema.  Skin:  No rashes, ecchymosis, or petechia.  LABORATORY STUDIES:  White cell count is 6.1, hemoglobin 15.2, hematocrit 43.9, platelet count 298.  IMPRESSION:  Scott Moss is a 37 year old gentleman with a history of stage I nonseminomatous germ cell tumor.  This was of the  left testicle. He had an orchiectomy.  He had 2 cycles of adjuvant chemotherapy with BEP.  He completed this back in 2006.  Again, I think we will follow him every year now.  I think a yearly followup would be appropriate for him.  Again, we will try to get him in to see Dr. Eloise Harman for his family doctor.   ______________________________ Josph Macho, M.D. PRE/MEDQ  D:  04/09/2013  T:  04/10/2013  Job:  [id number]

## 2013-04-12 LAB — COMPREHENSIVE METABOLIC PANEL
ALT: 46 U/L (ref 0–53)
Albumin: 4.2 g/dL (ref 3.5–5.2)
CO2: 22 mEq/L (ref 19–32)
Calcium: 9.1 mg/dL (ref 8.4–10.5)
Chloride: 105 mEq/L (ref 96–112)
Creatinine, Ser: 1.01 mg/dL (ref 0.50–1.35)
Potassium: 4.2 mEq/L (ref 3.5–5.3)
Total Protein: 6.6 g/dL (ref 6.0–8.3)

## 2013-04-12 LAB — LACTATE DEHYDROGENASE: LDH: 165 U/L (ref 94–250)

## 2013-07-27 ENCOUNTER — Ambulatory Visit (INDEPENDENT_AMBULATORY_CARE_PROVIDER_SITE_OTHER): Payer: BC Managed Care – PPO | Admitting: Medical

## 2013-07-27 ENCOUNTER — Encounter: Payer: Self-pay | Admitting: Medical

## 2013-07-27 VITALS — BP 122/82 | HR 110 | Temp 98.5°F | Resp 18 | Wt 220.0 lb

## 2013-07-27 DIAGNOSIS — H6590 Unspecified nonsuppurative otitis media, unspecified ear: Secondary | ICD-10-CM

## 2013-07-27 DIAGNOSIS — R05 Cough: Secondary | ICD-10-CM

## 2013-07-27 DIAGNOSIS — R059 Cough, unspecified: Secondary | ICD-10-CM

## 2013-07-27 DIAGNOSIS — R062 Wheezing: Secondary | ICD-10-CM

## 2013-07-27 MED ORDER — HYDROCODONE-HOMATROPINE 5-1.5 MG/5ML PO SYRP: 5.0000 mL | ORAL_SOLUTION | Freq: Three times a day (TID) | ORAL | Status: DC | PRN

## 2013-07-27 MED ORDER — CLARITHROMYCIN 500 MG PO TABS: 500.0000 mg | ORAL_TABLET | Freq: Two times a day (BID) | ORAL | Status: DC

## 2013-07-27 MED ORDER — METHYLPREDNISOLONE SODIUM SUCC 125 MG IJ SOLR
125.0000 mg | Freq: Once | INTRAMUSCULAR | Status: AC
  Administered 2013-07-27: 125 mg via INTRAMUSCULAR

## 2013-07-27 NOTE — Progress Notes (Signed)
Subjective:  Scott Moss is a 37 y.o. male who presents for cough.  Symptoms include 1+month of coughing, but not improving.  He works outside in the cold weather.  Worse cough and congestion this past week or so.  currently feels weak, runny nose, sneezing, chills, sweats alternating.  Sore throat for coughing.  Ribs hurt from coughing so much.   He notes ear pain.  Has felt wheezy.  Denies sinus pressure, teeth pain, NVD.  No SOB, no swelling in legs.  Treatment to date: Nyquil, Robitussin, mucinex..  No sick contacts.   He does not smoke.  He does have hx/o pneumonia.   He does not have a history of bronchitis.   No other aggravating or relieving factors.  No other c/o.  The following portions of the patient's history were reviewed and updated as appropriate: allergies, current medications, past family history, past medical history, past social history, past surgical history and problem list.  ROS as in subjective  Past Medical History  Diagnosis Date  . Headache(784.0)     sinus  . Chondromalacia of left knee 09/2012  . Cancer 02/2005    testicular     Objective: BP 122/82  Pulse 110  Temp(Src) 98.5 F (36.9 C) (Oral)  Resp 18  Wt 220 lb (99.791 kg)  SpO2 98%  General appearance: Alert, WD/WN, no distress, somewhat ill appearing                             Skin: warm, no rash, no diaphoresis                           Head: no sinus tenderness                            Eyes: conjunctiva normal, corneas clear, PERRLA                            Ears: bilat TMs with retarction, purulent effusions, external ear canals normal                          Nose: septum midline, turbinates swollen, with erythema and clear discharge             Mouth/throat: MMM, tongue normal, mild pharyngeal erythema                           Neck: supple, no adenopathy, no thyromegaly, nontender                          Heart: RRR, normal S1, S2, no murmurs                         Lungs: +bronchial  breath sounds, +scattered rhonchi, no wheezes, no rales                Extremities: no edema, nontender     Assessment: Encounter Diagnoses  Name Primary?  . Cough Yes  . Wheezing   . Serous otitis media, bilateral      Plan:  initially used 1 round of albuterol but this didn't help.  CXR shows no pneumonia, normal cardiac silhouette, no effusion.  Discussed case  with Dr. Susann Givens.   We will treat for bronchitis and OM.  Begin Biaxin, rest, hydrate well, gave 125mg  Solumedrol IM, Hycodan syrup prn.  Call/return in 2-3 days if symptoms are worse or not improving.  Advised that cough may linger even after the infection is improved.

## 2013-08-05 HISTORY — PX: CERVICAL SPINE SURGERY: SHX589

## 2014-01-07 ENCOUNTER — Encounter: Payer: Self-pay | Admitting: Medical

## 2014-01-07 ENCOUNTER — Ambulatory Visit
Admission: RE | Admit: 2014-01-07 | Discharge: 2014-01-07 | Disposition: A | Discharge status: Home / Self Care | Payer: BC Managed Care – PPO | Source: Ambulatory Visit | Attending: Medical | Admitting: Medical

## 2014-01-07 ENCOUNTER — Ambulatory Visit (INDEPENDENT_AMBULATORY_CARE_PROVIDER_SITE_OTHER): Payer: BC Managed Care – PPO | Admitting: Medical

## 2014-01-07 VITALS — BP 134/92 | HR 64 | Temp 97.9°F | Resp 18 | Wt 218.0 lb

## 2014-01-07 DIAGNOSIS — IMO0002 Reserved for concepts with insufficient information to code with codable children: Secondary | ICD-10-CM

## 2014-01-07 DIAGNOSIS — M542 Cervicalgia: Secondary | ICD-10-CM

## 2014-01-07 DIAGNOSIS — M792 Neuralgia and neuritis, unspecified: Secondary | ICD-10-CM

## 2014-01-07 MED ORDER — HYDROCODONE-ACETAMINOPHEN 5-325 MG PO TABS: 1.0000 | ORAL_TABLET | Freq: Four times a day (QID) | ORAL | Status: DC | PRN

## 2014-01-07 MED ORDER — IBUPROFEN 800 MG PO TABS: 800.0000 mg | ORAL_TABLET | Freq: Three times a day (TID) | ORAL | Status: DC | PRN

## 2014-01-07 NOTE — Progress Notes (Signed)
   Subjective:   Scott Moss is a 38 y.o. male presenting on 01/07/2014 with neck/arm/chest pain  Here for possible pinched nerve in neck.  1 wk of worsening pain down left arm from the neck.   Using Ibuprofen 2 OTC q4 hours, has constant shooting pain, throbbing pain, won't quit.   Gets some numb sensation and tingling in left arm generalized.  No swelling, no fever, no weight loss.  No recent trauma, fall, or injury.  Drives tractor trailer for a living.  Does lift heavy objects on the job in general.   He does note hx/o neck surgery prior due to right arm numbness. Dr. Trenton Gammon did prior surgery.  No other aggravating or relieving factors.  No other complaint.  Review of Systems ROS as in subjective      Objective:    Filed Vitals:   01/07/14 1111  BP: 134/92  Pulse: 64  Temp: 97.9 F (36.6 C)  Resp: 18    General appearance: alert, no distress, WD/WN Neck: supple, nontender to palpation, but pain with range of motion, range of motion slightly reduced, anterior surgical scar, no lymphadenopathy, no thyromegaly, no masses MSK: Generalized mild tenderness to palpation of left arm throughout, normal range of motion except pain with range of motion of the left arm in general Back: Tender left upper paraspinal muscle region Neuro: Arms normal strength, sensation, DTRs Heart: RRR, normal S1, S2, no murmurs Lungs: CTA bilaterally, no wheezes, rhonchi, or rales Pulses: 2+ symmetric, upper extremities, normal cap refill Extremities no edema    Assessment: Encounter Diagnoses  Name Primary?  . Cervicalgia Yes  . Radicular pain in left arm      Plan: Discussed possible causes, suspected ruptured disc in the C-spine, but could be other etiology. He does have a history of prior neck fusion do to radicular symptoms on the right.  Prescriptions today for ibuprofen 800, 3 times a day, hydrocodone as needed. We'll send for C-spine x-ray.  followup pending x-ray  Scott Moss was seen today for  neck/arm/chest pain.  Diagnoses and associated orders for this visit:  Cervicalgia - DG Cervical Spine Complete; Future  Radicular pain in left arm - DG Cervical Spine Complete; Future  Other Orders - HYDROcodone-acetaminophen (NORCO/VICODIN) 5-325 MG per tablet; Take 1 tablet by mouth every 6 (six) hours as needed for moderate pain. - ibuprofen (ADVIL,MOTRIN) 800 MG tablet; Take 1 tablet (800 mg total) by mouth every 8 (eight) hours as needed.    Return pending xray.

## 2014-01-12 ENCOUNTER — Telehealth: Payer: Self-pay | Admitting: Family Medicine

## 2014-01-12 NOTE — Telephone Encounter (Signed)
Patient agreed to go and have a CTneck. I am working on the CT appointment. CLS

## 2014-01-12 NOTE — Telephone Encounter (Signed)
If no improvement at all, we can either refer back to Dr. Trenton Gammon, or we can consider up dated imaging such as CT neck to help further eval.   See what he wants to do?

## 2014-01-12 NOTE — Telephone Encounter (Signed)
Patient called and said the medication is not helping his neck and arm pain. He would like to know what he needs to do? CLS

## 2014-01-12 NOTE — Telephone Encounter (Signed)
I called over to [facility] and spoke with the radiologists and he does recommend for this patient to have a MRI Neck if he is a candidate for this. He ask me if he had any hard ware and I look back and I believe he does. What do you to do now?  CLS

## 2014-01-13 ENCOUNTER — Other Ambulatory Visit: Payer: Self-pay | Admitting: Medical

## 2014-01-13 DIAGNOSIS — M5410 Radiculopathy, site unspecified: Secondary | ICD-10-CM

## 2014-01-13 NOTE — Telephone Encounter (Signed)
I called radiology, please set up MR C spine, and make sure they are aware per Dr. Alvester Chou, contrast at radiologist's discretion.  He has had prior C spine fusion/surgery.

## 2014-01-13 NOTE — Telephone Encounter (Signed)
Scott Moss, Patient would like procedure late one afternoon or evening next week.

## 2014-01-17 NOTE — Telephone Encounter (Signed)
PATIENT IS AWARE OF HIS APPOINTMENT AT [facility] Scott Moss, Scott Moss ON 01/22/14 @ 200 PM. CLS

## 2014-01-22 ENCOUNTER — Encounter (INDEPENDENT_AMBULATORY_CARE_PROVIDER_SITE_OTHER): Payer: Self-pay

## 2014-01-22 ENCOUNTER — Ambulatory Visit
Admission: RE | Admit: 2014-01-22 | Discharge: 2014-01-22 | Disposition: A | Discharge status: Home / Self Care | Payer: BC Managed Care – PPO | Source: Ambulatory Visit | Attending: Medical | Admitting: Medical

## 2014-01-22 DIAGNOSIS — M5410 Radiculopathy, site unspecified: Secondary | ICD-10-CM

## 2014-02-01 ENCOUNTER — Telehealth: Payer: Self-pay | Admitting: Family Medicine

## 2014-02-01 NOTE — Telephone Encounter (Signed)
PATIENT IS AWARE OF HIS APPOINTMENT TO SEE DR. POOL ON 02/02/14 @ 130 PM AT  [facility]. [phone]. CLS

## 2014-03-02 IMAGING — CR DG ELBOW COMPLETE 3+V*L*
2 series · 2 of 2 positions shown · non-contrast
Comparison: None.

CLINICAL DATA: Pain after injury.

LEFT ELBOW - COMPLETE 3+ VIEW

[AP]
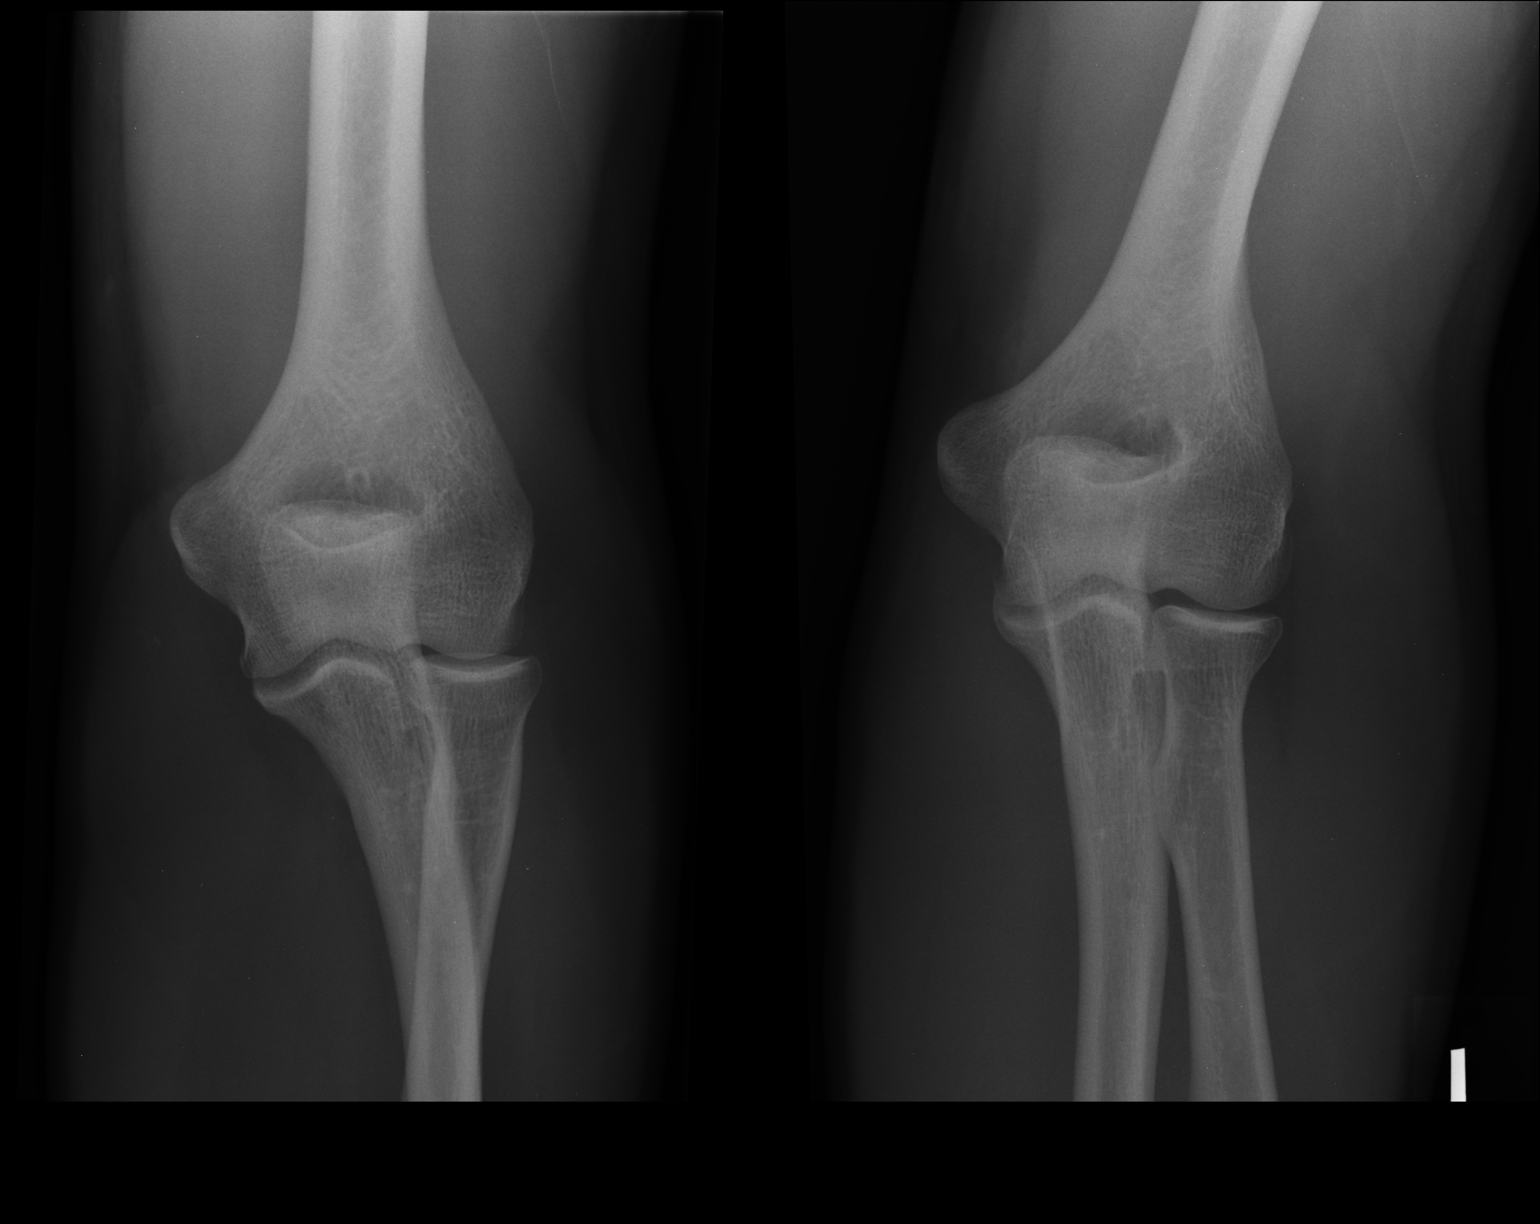

[ap obl ext rot]
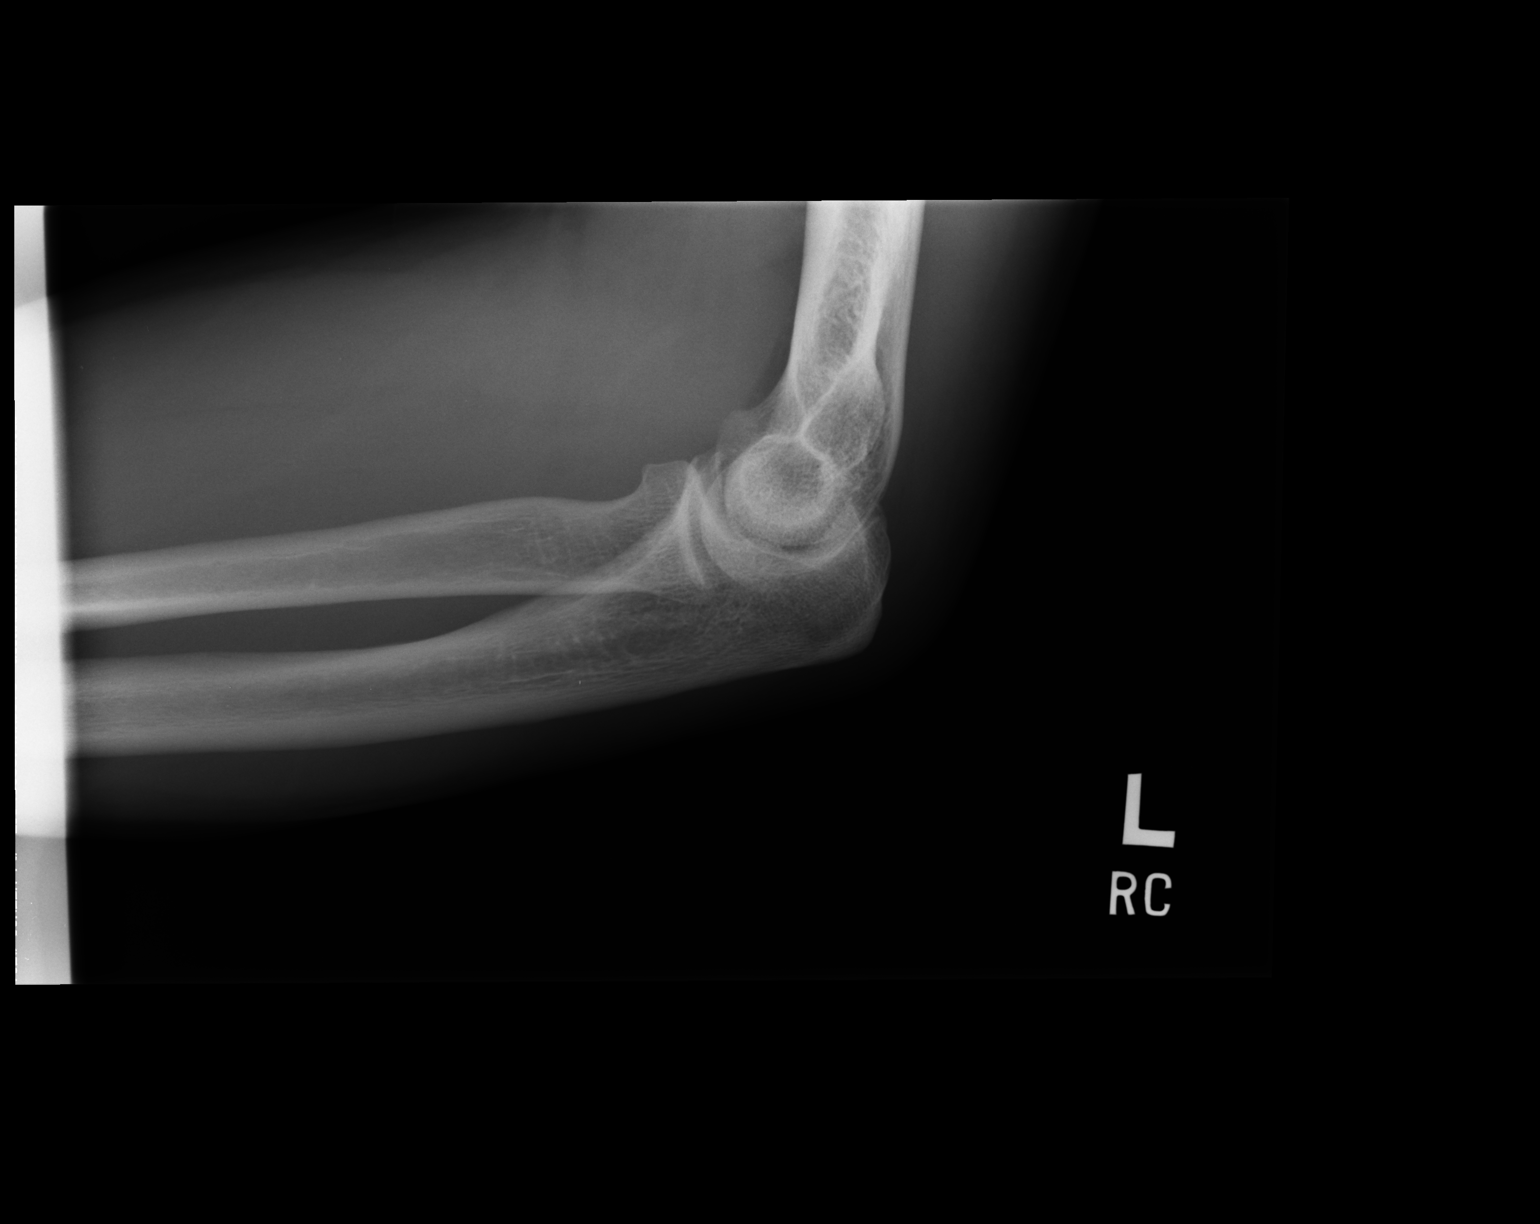

[2 of 2 positions shown; findings below may reference images not displayed]

FINDINGS: No acute fracture or dislocation.  No joint effusion
IMPRESSION: No acute osseous abnormality.

## 2014-04-08 ENCOUNTER — Encounter: Payer: Self-pay | Admitting: Hematology & Oncology

## 2014-04-08 ENCOUNTER — Ambulatory Visit (HOSPITAL_BASED_OUTPATIENT_CLINIC_OR_DEPARTMENT_OTHER): Payer: BC Managed Care – PPO | Admitting: Hematology & Oncology

## 2014-04-08 ENCOUNTER — Other Ambulatory Visit (HOSPITAL_BASED_OUTPATIENT_CLINIC_OR_DEPARTMENT_OTHER): Payer: BC Managed Care – PPO | Admitting: Lab

## 2014-04-08 VITALS — BP 140/106 | HR 82 | Temp 97.9°F | Resp 18 | Ht 69.0 in | Wt 217.0 lb

## 2014-04-08 DIAGNOSIS — Z8547 Personal history of malignant neoplasm of testis: Secondary | ICD-10-CM

## 2014-04-08 DIAGNOSIS — E785 Hyperlipidemia, unspecified: Secondary | ICD-10-CM

## 2014-04-08 DIAGNOSIS — R222 Localized swelling, mass and lump, trunk: Secondary | ICD-10-CM

## 2014-04-08 LAB — CBC WITH DIFFERENTIAL (CANCER CENTER ONLY)
BASO#: 0.1 10*3/uL (ref 0.0–0.2)
BASO%: 1.2 % (ref 0.0–2.0)
EOS ABS: 0.4 10*3/uL (ref 0.0–0.5)
EOS%: 7.2 % — ABNORMAL HIGH (ref 0.0–7.0)
HCT: 44.9 % (ref 38.7–49.9)
HGB: 15.9 g/dL (ref 13.0–17.1)
LYMPH#: 1.9 10*3/uL (ref 0.9–3.3)
LYMPH%: 32.5 % (ref 14.0–48.0)
MCH: 31.1 pg (ref 28.0–33.4)
MCHC: 35.4 g/dL (ref 32.0–35.9)
MCV: 88 fL (ref 82–98)
MONO#: 0.6 10*3/uL (ref 0.1–0.9)
MONO%: 9.9 % (ref 0.0–13.0)
NEUT%: 49.2 % (ref 40.0–80.0)
NEUTROS ABS: 2.8 10*3/uL (ref 1.5–6.5)
PLATELETS: 322 10*3/uL (ref 145–400)
RBC: 5.12 10*6/uL (ref 4.20–5.70)
RDW: 12.2 % (ref 11.1–15.7)
WBC: 5.7 10*3/uL (ref 4.0–10.0)

## 2014-04-08 NOTE — Progress Notes (Signed)
Hematology and Oncology Follow Up Visit  Scott Scott Moss [id number] July 17, 1976 38 y.o. 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  Scott Moss is back for followup. She's out of precoat summer. Unfortunately, he is surgery at C6-[facility]. This was done a few weeks ago. He had pain down his left arm. Had numbness in his left fingers. She underwent surgery. As such he was outpatient surgery. He did incredibly well with this.  During the surgery, it was noted that he had some swelling in the left supraclavicular region. He was told these were "lymph nodes". As such, we'll have to get a CT scan on him. He has a past history of a non-seminoma Mr. results of her. This was actually 9 years ago. Coffee that recurrence at this time would be very unusual.  He's had no abdominal pain. He's had no change in bowel bladder habits. He's had no leg swelling.  He's been off work since he had the surgery.  Medications: Current outpatient prescriptions:HYDROcodone-acetaminophen (NORCO/VICODIN) 5-325 MG per tablet, Take 1 tablet by mouth every 6 (six) hours as needed for moderate pain., Disp: 20 tablet, Rfl: 0;  ibuprofen (ADVIL,MOTRIN) 800 MG tablet, Take 1 tablet (800 mg total) by mouth every 8 (eight) hours as needed., Disp: 20 tablet, Rfl: 0  Allergies: No Known Allergies  Past Medical History, Surgical history, Social history, and Family History were reviewed and updated.  Review of Systems: As above  Physical Exam:  height is 5\' 9"  (1.753 m) and weight is 217 lb (98.431 kg). His oral temperature is 97.9 F (36.6 C). His blood pressure is 140/106 and his pulse is 82. His respiration is 18.   Well-developed and well-nourished white gentleman. Head and neck exam shows no ocular or oral lesions. He has no palpable cervical or supraclavicular lymph nodes. There is a  slight fullness in the left supraclavicular region. He has the cervical laminectomy scar on the right anterior neck. Lungs are clear. Cardiac exam regular in rhythm with no murmurs rubs or bruits. Axillary exam shows no bilateral axillary adenopathy. Abdomen soft. Has good bowel sounds. There is no fluid wave. There is no palpable liver or spleen tip. Back exam no tenderness over the spine, ribs or hips. Extremities shows no clubbing, cyanosis or edema. Neurological exam shows no focal neurological deficits. Skin exam no rashes.  Lab Results  Component Value Date   WBC 5.7 04/08/2014   HGB 15.9 04/08/2014   HCT 44.9 04/08/2014   MCV 88 04/08/2014   PLT 322 04/08/2014     Chemistry      Component Value Date/Time   NA 138 04/09/2013 0914   K 4.2 04/09/2013 0914   CL 105 04/09/2013 0914   CO2 22 04/09/2013 0914   BUN 14 04/09/2013 0914   CREATININE 1.01 04/09/2013 0914      Component Value Date/Time   CALCIUM 9.1 04/09/2013 0914   ALKPHOS 75 04/09/2013 0914   AST 27 04/09/2013 0914   ALT 46 04/09/2013 0914   BILITOT 0.5 04/09/2013 0914         Impression and Plan: Mr. Scott Scott Moss is 38 year old gentleman. He is a past history of a stage I 9 seminomatous germ cell tumor. This is a left testicle. He underwent an orchiectomy. Had 2 cycles of adjuvant chemotherapy with BEP.  I will  figure be very unusual for and that recurrence. His last tumor markers were normal. However, they would have to check him. I will do a CT of his neck down to his abdomen. I would have to get a total body scan to see if he does have abdominal recurrence that may have metastasized to the left supraclitoral region.  Otherwise, everything else looks okay.  I'll plan to get the scans next week. Problem and plan to see him back in 6 weeks.   Volanda Napoleon, MD 9/4/20159:15 AM

## 2014-04-10 LAB — LIPID PANEL
Cholesterol: 194 mg/dL (ref 0–200)
HDL: 40 mg/dL (ref >39)
LDL CALC: 108 mg/dL — AB (ref 0–99)
Total CHOL/HDL Ratio: 4.9 Ratio
Triglycerides: 228 mg/dL — ABNORMAL HIGH (ref <150)
VLDL: 46 mg/dL — ABNORMAL HIGH (ref 0–40)

## 2014-04-10 LAB — COMPREHENSIVE METABOLIC PANEL
ALBUMIN: 4.4 g/dL (ref 3.5–5.2)
ALT: 67 U/L — AB (ref 0–53)
AST: 37 U/L (ref 0–37)
Alkaline Phosphatase: 99 U/L (ref 39–117)
BILIRUBIN TOTAL: 0.6 mg/dL (ref 0.2–1.2)
BUN: 10 mg/dL (ref 6–23)
CO2: 26 meq/L (ref 19–32)
Calcium: 9.7 mg/dL (ref 8.4–10.5)
Chloride: 105 mEq/L (ref 96–112)
Creatinine, Ser: 1.02 mg/dL (ref 0.50–1.35)
GLUCOSE: 99 mg/dL (ref 70–99)
POTASSIUM: 4.5 meq/L (ref 3.5–5.3)
SODIUM: 139 meq/L (ref 135–145)
TOTAL PROTEIN: 6.9 g/dL (ref 6.0–8.3)

## 2014-04-10 LAB — BETA HCG QUANT (REF LAB)

## 2014-04-10 LAB — AFP TUMOR MARKER: AFP TUMOR MARKER: 1.7 ng/mL (ref <6.1)

## 2014-04-10 LAB — LACTATE DEHYDROGENASE: LDH: 180 U/L (ref 94–250)

## 2014-04-12 ENCOUNTER — Encounter (HOSPITAL_BASED_OUTPATIENT_CLINIC_OR_DEPARTMENT_OTHER): Payer: Self-pay

## 2014-04-12 ENCOUNTER — Ambulatory Visit (HOSPITAL_BASED_OUTPATIENT_CLINIC_OR_DEPARTMENT_OTHER)
Admission: RE | Admit: 2014-04-12 | Discharge: 2014-04-12 | Disposition: A | Discharge status: Home / Self Care | Payer: BC Managed Care – PPO | Source: Ambulatory Visit | Attending: Hematology & Oncology | Admitting: Hematology & Oncology

## 2014-04-12 DIAGNOSIS — R599 Enlarged lymph nodes, unspecified: Secondary | ICD-10-CM | POA: Insufficient documentation

## 2014-04-12 DIAGNOSIS — Z8547 Personal history of malignant neoplasm of testis: Secondary | ICD-10-CM

## 2014-04-12 DIAGNOSIS — R918 Other nonspecific abnormal finding of lung field: Secondary | ICD-10-CM | POA: Insufficient documentation

## 2014-04-12 MED ORDER — IOHEXOL 300 MG/ML  SOLN
100.0000 mL | Freq: Once | INTRAMUSCULAR | Status: AC | PRN
  Administered 2014-04-12: 100 mL via INTRAVENOUS

## 2014-04-13 ENCOUNTER — Telehealth: Payer: Self-pay | Admitting: *Deleted

## 2014-04-13 NOTE — Telephone Encounter (Addendum)
Message copied by Lenn Sink on Wed Apr 13, 2014 12:37 PM ------      Message from: Volanda Napoleon      Created: Tue Apr 12, 2014  9:10 PM       Call - NO swollen lymph nodes!! No evodence of cancer!!!  Laurey Arrow ------Informed pt that CT scan showed no swollen lymph nodes and no evidence of cancer.

## 2014-04-20 ENCOUNTER — Encounter: Payer: Self-pay | Admitting: Medical

## 2014-04-20 ENCOUNTER — Ambulatory Visit (INDEPENDENT_AMBULATORY_CARE_PROVIDER_SITE_OTHER): Payer: BC Managed Care – PPO | Admitting: Medical

## 2014-04-20 VITALS — BP 100/82 | HR 96 | Temp 97.8°F | Resp 16 | Ht 70.0 in | Wt 207.0 lb

## 2014-04-20 DIAGNOSIS — Z2821 Immunization not carried out because of patient refusal: Secondary | ICD-10-CM

## 2014-04-20 DIAGNOSIS — E663 Overweight: Secondary | ICD-10-CM

## 2014-04-20 DIAGNOSIS — Z809 Family history of malignant neoplasm, unspecified: Secondary | ICD-10-CM

## 2014-04-20 DIAGNOSIS — Z0000 Encounter for general adult medical examination without abnormal findings: Secondary | ICD-10-CM

## 2014-04-20 DIAGNOSIS — Z8547 Personal history of malignant neoplasm of testis: Secondary | ICD-10-CM

## 2014-04-20 LAB — POCT URINALYSIS DIPSTICK
BILIRUBIN UA: NEGATIVE
Glucose, UA: NEGATIVE
LEUKOCYTES UA: NEGATIVE
NITRITE UA: NEGATIVE
PH UA: 5
Spec Grav, UA: 1.02
Urobilinogen, UA: NEGATIVE

## 2014-04-20 NOTE — Progress Notes (Signed)
Subjective:   HPI  Scott Moss is a 37 y.o. male who presents for a complete physical.  Needs insurance form completed for work.  Preventative care: Last ophthalmology visit:n/a Last dental visit:yes Last colonoscopy:n/a Last prostate exam: ? Last EKG:06/2006 Last labs: ?  Prior vaccinations: TD or Tdap:06/2006 Influenza:declined flu vaccine Pneumococcal:n/a Shingles/Zostavax:n/a  Advanced directive:n/a Health care power of attorney:n/a Living will:n/a  Concerns: none  Reviewed their medical, surgical, family, social, medication, and allergy history and updated chart as appropriate.  Past Medical History  Diagnosis Date  . Headache(784.0)     sinus  . Chondromalacia of left knee 09/2012  . Cancer 02/2005    testicular    Past Surgical History  Procedure Laterality Date  . Radical orchiectomy  02/07/2005    left; with left testicular implant  . Anterior cervical decomp/discectomy fusion  11/11/2008    C5-6  . Knee arthroscopy      right  . Appendectomy    . Cardiac catheterization  04/13/2010    no disease  . Knee arthroscopy with lateral release Left 09/18/2012    Procedure: KNEE ARTHROSCOPY WITH LATERAL RELEASE;  Surgeon: Daniel F Murphy, MD;  Location: [facility];  Service: Orthopedics;  Laterality: Left;  WITH DEBRIDEMENT AND SHAVING(CHONDROPLASTY), Excision Medial Plica  . Cervical spine surgery  2015    Dr. Henry Poole  . Colonoscopy      prior, no problems per patient, year unknown, possibly in early 30s.    History   Social History  . Marital Status: Married    Spouse Name: N/A    Number of Children: N/A  . Years of Education: N/A   Occupational History  . Not on file.   Social History Main Topics  . Smoking status: Never Smoker   . Smokeless tobacco: Never Used     Comment: never used tobacco  . Alcohol Use: 0.0 oz/week     Comment: occasionally  . Drug Use: No  . Sexual Activity: Yes   Other Topics Concern  . Not on file    Social History Narrative   Married, 2 children, ages 19yo and 11yo, is a truck driver for [employer].   Exercise walks the track, some weight lifting.    Family History  Problem Relation Age of Onset  . Cancer Mother     gynecological  . Hypertension Mother   . Stroke Mother   . Migraines Mother   . Cancer Father     lung  . Hypertension Father   . Heart attack Father 35  . Cancer Sister 45    multiple myeloma  . Hypertension Sister   . Migraines Sister   . Diabetes Maternal Uncle     No current outpatient prescriptions on file.  No Known Allergies   Review of Systems Constitutional: -fever, -chills, -sweats, -unexpected weight change, -decreased appetite, -fatigue Allergy: -sneezing, -itching, -congestion Dermatology: -changing moles, --rash, -lumps ENT: -runny nose, -ear pain, -sore throat, -hoarseness, -sinus pain, -teeth pain, - ringing in ears, -hearing loss, -nosebleeds Cardiology: -chest pain, -palpitations, -swelling, -difficulty breathing when lying flat, -waking up short of breath Respiratory: -cough, -shortness of breath, -difficulty breathing with exercise or exertion, -wheezing, -coughing up blood Gastroenterology: -abdominal pain, -nausea, -vomiting, -diarrhea, -constipation, -blood in stool, -changes in bowel movement, -difficulty swallowing or eating Hematology: -bleeding, -bruising  Musculoskeletal: -joint aches, -muscle aches, -joint swelling, -back pain, -neck pain, -cramping, -changes in gait Ophthalmology: denies vision changes, eye redness, itching, discharge Urology: -burning with   urination, -difficulty urinating, -blood in urine, -urinary frequency, -urgency, -incontinence Neurology: -headache, -weakness, -tingling, -numbness, -memory loss, -falls, -dizziness Psychology: -depressed mood, -agitation, +sleep problems     Objective:   Physical Exam  BP 100/82  Pulse 96  Temp(Src) 97.8 F (36.6 C) (Oral)  Resp 16  Ht 5' 9.5" (1.765 m)  Wt  207 lb (93.895 kg)  BMI 30.14 kg/m2  General appearance: alert, no distress, WD/WN, white male Skin: numerous tattoos throughout - upper left and right chest, right upper chest truck, letters across upper abdomen, upper back, bilat calves, right dorsal wrist, no worrisome skin lesions HEENT: normocephalic, conjunctiva/corneas normal, sclerae anicteric, PERRLA, EOMi, nares patent, no discharge or erythema, pharynx normal Oral cavity: MMM, tongue normal, teeth - moderate plaque throughout Neck: supple, no lymphadenopathy, no thyromegaly, no masses, normal ROM, anterior surgical scar, no bruits Chest: non tender, normal shape and expansion Heart: RRR, normal S1, S2, no murmurs Lungs: CTA bilaterally, no wheezes, rhonchi, or rales Abdomen: +bs, soft, non tender, non distended, no masses, no hepatomegaly, no splenomegaly, no bruits Back: non tender, normal ROM, no scoliosis Musculoskeletal: surgical scars bilat anterior knees, otherwise upper extremities non tender, no obvious deformity, normal ROM throughout, lower extremities non tender, no obvious deformity, normal ROM throughout Extremities: no edema, no cyanosis, no clubbing Pulses: 2+ symmetric, upper and lower extremities, normal cap refill Neurological: alert, oriented x 3, CN2-12 intact, strength normal upper extremities and lower extremities, sensation normal throughout, DTRs 2+ throughout, no cerebellar signs, gait normal Psychiatric: normal affect, behavior normal, pleasant  GU: normal male external genitalia, circumcised, left scrotum with implant s/p prior orchiectomy, otherwise nontender, no masses, no hernia, no lymphadenopathy Rectal: anus normal, occult negative stool , prostate WNL   Assessment and Plan :    Encounter Diagnoses  Name Primary?  . Routine general medical examination at a health care facility Yes  . History of testicular cancer   . Family history of cancer   . Overweight   . Refused influenza vaccine      Physical exam - discussed healthy lifestyle, diet, exercise, preventative care, vaccinations, and addressed their concerns.  Reviewed recent labs from September in computer.  Hx/o cancer, family hx/o cancer - sees Dr. Marin Olp regularly  overweight - discussed diet, exercise, efforts to lose a little weight  Refused flu shot  Follow-up yearly for physical

## 2014-05-16 ENCOUNTER — Telehealth: Payer: Self-pay | Admitting: Hematology & Oncology

## 2014-05-16 NOTE — Telephone Encounter (Signed)
Pt moved 10-13 to 11-16

## 2014-05-17 ENCOUNTER — Ambulatory Visit: Payer: BC Managed Care – PPO | Admitting: Hematology & Oncology

## 2014-05-17 ENCOUNTER — Other Ambulatory Visit: Payer: BC Managed Care – PPO | Admitting: Lab

## 2014-06-17 ENCOUNTER — Other Ambulatory Visit: Payer: Self-pay | Admitting: *Deleted

## 2014-06-17 DIAGNOSIS — Z8547 Personal history of malignant neoplasm of testis: Secondary | ICD-10-CM

## 2014-06-20 ENCOUNTER — Encounter: Payer: Self-pay | Admitting: Hematology & Oncology

## 2014-06-20 ENCOUNTER — Other Ambulatory Visit (HOSPITAL_BASED_OUTPATIENT_CLINIC_OR_DEPARTMENT_OTHER): Payer: BC Managed Care – PPO | Admitting: Lab

## 2014-06-20 ENCOUNTER — Ambulatory Visit (HOSPITAL_BASED_OUTPATIENT_CLINIC_OR_DEPARTMENT_OTHER): Payer: BC Managed Care – PPO | Admitting: Hematology & Oncology

## 2014-06-20 VITALS — BP 147/100 | HR 77 | Temp 97.7°F | Resp 18 | Ht 70.0 in | Wt 221.0 lb

## 2014-06-20 DIAGNOSIS — E782 Mixed hyperlipidemia: Secondary | ICD-10-CM

## 2014-06-20 DIAGNOSIS — Z8547 Personal history of malignant neoplasm of testis: Secondary | ICD-10-CM

## 2014-06-20 LAB — CBC WITH DIFFERENTIAL (CANCER CENTER ONLY)
BASO#: 0.1 10*3/uL (ref 0.0–0.2)
BASO%: 0.8 % (ref 0.0–2.0)
EOS%: 2.1 % (ref 0.0–7.0)
Eosinophils Absolute: 0.2 10*3/uL (ref 0.0–0.5)
HEMATOCRIT: 47.1 % (ref 38.7–49.9)
HEMOGLOBIN: 16.4 g/dL (ref 13.0–17.1)
LYMPH#: 2.1 10*3/uL (ref 0.9–3.3)
LYMPH%: 27.2 % (ref 14.0–48.0)
MCH: 30.4 pg (ref 28.0–33.4)
MCHC: 34.8 g/dL (ref 32.0–35.9)
MCV: 87 fL (ref 82–98)
MONO#: 0.6 10*3/uL (ref 0.1–0.9)
MONO%: 7.2 % (ref 0.0–13.0)
NEUT%: 62.7 % (ref 40.0–80.0)
NEUTROS ABS: 4.8 10*3/uL (ref 1.5–6.5)
Platelets: 311 10*3/uL (ref 145–400)
RBC: 5.4 10*6/uL (ref 4.20–5.70)
RDW: 11.9 % (ref 11.1–15.7)
WBC: 7.7 10*3/uL (ref 4.0–10.0)

## 2014-06-20 LAB — TSH CHCC: TSH: 1.518 m(IU)/L (ref 0.320–4.118)

## 2014-06-20 NOTE — Progress Notes (Signed)
Hematology and Oncology Follow Up Visit  TRACEY STEWART [id number] 1976/07/19 38 y.o. 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  Beyl is back for followup. He is doing quite well. His neck is not bothering him.  Would did go ahead and get a CT scan of his neck. There is no adenopathy that looked pathologic.  He is working. He's getting ready to go to [place] for a job.  He's had no other problems. He's had abdominal pain. There's been no cough. He's had no leg swelling. He's had no joint issues. He's had no change in bowel or bladder habits.  .  Medications: Current outpatient prescriptions: UNKNOWN TO PATIENT, NOT TAKING ANY MEDICATIONS AT PRESENT., Disp: , Rfl:   Allergies: No Known Allergies  Past Medical History, Surgical history, Social history, and Family History were reviewed and updated.  Review of Systems: As above  Physical Exam:  height is 5\' 10"  (1.778 m) and weight is 221 lb (100.245 kg). His oral temperature is 97.7 F (36.5 C). His blood pressure is 147/100 and his pulse is 77. His respiration is 18.   Well-developed and well-nourished white gentleman. Head and neck exam shows no ocular or oral lesions. He has no palpable cervical or supraclavicular lymph nodes. There is a slight fullness in the left supraclavicular region. He has the cervical laminectomy scar on the right anterior neck. Lungs are clear. Cardiac exam regular rate and rhythm with no murmurs rubs or bruits. Axillary exam shows no bilateral axillary adenopathy. Abdomen soft. Has good bowel sounds. There is no fluid wave. There is no palpable liver or spleen tip. Back exam no tenderness over the spine, ribs or hips. Extremities shows no clubbing, cyanosis or edema. Neurological exam shows no focal neurological deficits. Skin exam no rashes.  Lab  Results  Component Value Date   WBC 7.7 06/20/2014   HGB 16.4 06/20/2014   HCT 47.1 06/20/2014   MCV 87 06/20/2014   PLT 311 06/20/2014     Chemistry      Component Value Date/Time   NA 139 04/08/2014 0751   K 4.5 04/08/2014 0751   CL 105 04/08/2014 0751   CO2 26 04/08/2014 0751   BUN 10 04/08/2014 0751   CREATININE 1.02 04/08/2014 0751      Component Value Date/Time   CALCIUM 9.7 04/08/2014 0751   ALKPHOS 99 04/08/2014 0751   AST 37 04/08/2014 0751   ALT 67* 04/08/2014 0751   BILITOT 0.6 04/08/2014 0751         Impression and Plan: Mr. Erney is 38 year old gentleman. He has a past history of a stage I non- seminomatous germ cell tumor. This is a left testicle. He underwent an orchiectomy. Had 2 cycles of adjuvant chemotherapy with BEP.  I do not see any evidence of recurrence. His tumor markers have been normal. Otherwise, everything else looks okay.  I think we can probably let him go for the practice now. It has been about 10 years. He is doing quite well. I believe that he is cured. I told him to take a baby aspirin a day.  He is to watch out for his cholesterol. We will check his cholesterol today.   He really needs to see family doctor. I don't think he has one. If he does, he does not  go to see him. I told him the importance of having his labs checked, particularly his cholesterol.  We will see him back if necessary.   Volanda Napoleon, MD 11/16/201512:50 PM

## 2014-06-21 ENCOUNTER — Telehealth: Payer: Self-pay | Admitting: Internal Medicine

## 2014-06-21 ENCOUNTER — Telehealth: Payer: Self-pay | Admitting: *Deleted

## 2014-06-21 NOTE — Telephone Encounter (Addendum)
Spoke with patient. Patient states he will follow up with his physician, Camelia Eng Tysinger PA-C. Faxed results of blood work to office.  ----- Message from Volanda Napoleon, MD sent at 06/20/2014  5:38 PM EST ----- Call him and tell him that his cholesterol is awful. He really needs to be on medication for his cholesterol. He really needs to see his primary doctor for this. Please find out who this is and sent him the cholesterol levels. This is important that he get his cholesterol under control or he will be at risk for a heart attack. Please make sure that he is taking his baby aspirin daily. Laurey Arrow

## 2014-06-21 NOTE — Telephone Encounter (Signed)
Pt called stating that he was told to call here to get prescribed a cholesterol med. He had lab work done which is in the computer with results. Please send [facility] to [facility] in [place]

## 2014-06-23 ENCOUNTER — Telehealth: Payer: Self-pay | Admitting: Medical

## 2014-06-23 LAB — BETA HCG QUANT (REF LAB)

## 2014-06-23 LAB — COMPREHENSIVE METABOLIC PANEL
ALK PHOS: 90 U/L (ref 39–117)
ALT: 34 U/L (ref 0–53)
AST: 21 U/L (ref 0–37)
Albumin: 4.4 g/dL (ref 3.5–5.2)
BUN: 13 mg/dL (ref 6–23)
CALCIUM: 9.5 mg/dL (ref 8.4–10.5)
CHLORIDE: 102 meq/L (ref 96–112)
CO2: 24 mEq/L (ref 19–32)
CREATININE: 1.07 mg/dL (ref 0.50–1.35)
Glucose, Bld: 94 mg/dL (ref 70–99)
Potassium: 4.4 mEq/L (ref 3.5–5.3)
Sodium: 139 mEq/L (ref 135–145)
Total Bilirubin: 0.4 mg/dL (ref 0.2–1.2)
Total Protein: 7.3 g/dL (ref 6.0–8.3)

## 2014-06-23 LAB — LIPID PANEL
CHOL/HDL RATIO: 4.9 ratio
Cholesterol: 225 mg/dL — ABNORMAL HIGH (ref 0–200)
HDL: 46 mg/dL (ref >39)
LDL CALC: 149 mg/dL — AB (ref 0–99)
Triglycerides: 149 mg/dL (ref <150)
VLDL: 30 mg/dL (ref 0–40)

## 2014-06-23 LAB — TESTOSTERONE: Testosterone: 299 ng/dL — ABNORMAL LOW (ref 300–890)

## 2014-06-23 LAB — AFP TUMOR MARKER-PREVIOUS METHOD

## 2014-06-23 LAB — AFP TUMOR MARKER: AFP-Tumor Marker: 1.9 ng/mL (ref <6.1)

## 2014-06-23 LAB — LACTATE DEHYDROGENASE: LDH: 166 U/L (ref 94–250)

## 2014-06-23 NOTE — Telephone Encounter (Signed)
I spoke to patient after reviewing recent oncology records, labs, etc.  His father did have MI age 40yo.  Jaxan's BPs usually run high at oncology office but not here.   When I checked his lipids in September they looked better than this past week at the oncology office.  His BP was also fine back in September .   Nevertheless, I asked him to check BPs at home 2 times per week, get me numbers in a few weeks, and he will return here for NMR lipo profile, CRP, A1C labs in a month or so at his convenience fasting to reassess and then likely start him on a statin.     I also reiterated that we are his PCM since the oncology notes suggested he wasn't aware of having a PCM.    He understands and will f/u as planned.

## 2014-06-23 NOTE — Telephone Encounter (Signed)
See other message from today

## 2014-10-19 ENCOUNTER — Encounter: Payer: Self-pay | Admitting: Medical

## 2014-10-19 ENCOUNTER — Ambulatory Visit (INDEPENDENT_AMBULATORY_CARE_PROVIDER_SITE_OTHER): Payer: BLUE CROSS/BLUE SHIELD | Admitting: Medical

## 2014-10-19 VITALS — BP 130/90 | HR 76 | Temp 97.8°F | Resp 15 | Wt 226.0 lb

## 2014-10-19 DIAGNOSIS — R079 Chest pain, unspecified: Secondary | ICD-10-CM | POA: Diagnosis not present

## 2014-10-19 DIAGNOSIS — I10 Essential (primary) hypertension: Secondary | ICD-10-CM | POA: Diagnosis not present

## 2014-10-19 DIAGNOSIS — R21 Rash and other nonspecific skin eruption: Secondary | ICD-10-CM

## 2014-10-19 LAB — CBC
HEMATOCRIT: 48 % (ref 39.0–52.0)
Hemoglobin: 16.1 g/dL (ref 13.0–17.0)
MCH: 29.6 pg (ref 26.0–34.0)
MCHC: 33.5 g/dL (ref 30.0–36.0)
MCV: 88.2 fL (ref 78.0–100.0)
MPV: 9.1 fL (ref 8.6–12.4)
Platelets: 326 10*3/uL (ref 150–400)
RBC: 5.44 MIL/uL (ref 4.22–5.81)
RDW: 14.5 % (ref 11.5–15.5)
WBC: 7.8 10*3/uL (ref 4.0–10.5)

## 2014-10-19 LAB — BASIC METABOLIC PANEL
BUN: 18 mg/dL (ref 6–23)
CO2: 23 meq/L (ref 19–32)
CREATININE: 1.01 mg/dL (ref 0.50–1.35)
Calcium: 9 mg/dL (ref 8.4–10.5)
Chloride: 105 mEq/L (ref 96–112)
Glucose, Bld: 88 mg/dL (ref 70–99)
POTASSIUM: 3.9 meq/L (ref 3.5–5.3)
Sodium: 138 mEq/L (ref 135–145)

## 2014-10-19 MED ORDER — DEXLANSOPRAZOLE 60 MG PO CPDR: 60.0000 mg | DELAYED_RELEASE_CAPSULE | Freq: Every day | ORAL | Status: DC

## 2014-10-19 MED ORDER — TRIAMCINOLONE ACETONIDE 0.1 % EX CREA: 1.0000 "application " | TOPICAL_CREAM | Freq: Two times a day (BID) | CUTANEOUS | Status: DC

## 2014-10-19 MED ORDER — OLMESARTAN MEDOXOMIL 20 MG PO TABS: 20.0000 mg | ORAL_TABLET | Freq: Every day | ORAL | Status: DC

## 2014-10-19 NOTE — Progress Notes (Signed)
Subjective:    Scott Moss is a 39 y.o. male who presents for evaluation of chest pain. Onset was 2 weeks ago. Symptoms have been unchanged since that time. The patient describes the pain as heaviness, pressure and radiates to the left arm. Patient rates pain as a 3/10 in intensity. Associated symptoms are: chest pain, dyspnea and nausea, sweats. Aggravating factors are: exercise. Alleviating factors are: rest. Patient's cardiac risk factors are: hypertension and male gender. Previous cardiac testing: coronary angiography, date 2011, location [facility] and electrocardiogram (ECG).  Also reports rash on both forearms recently, itchy, attributes to poison ivy, using nothing for symptoms.  The following portions of the patient's history were reviewed and updated as appropriate: allergies, current medications, past family history, past medical history, past social history, past surgical history and problem list.  Review of Systems Constitutional: denies fever, chills, +sweats, -unexpected weight change, anorexia, fatigue ENT: no runny nose, ear pain, sore throat, hoarseness, sinus pain, hearing loss, epistaxis Cardiology: denies palpitations, edema, orthopnea, paroxysmal nocturnal dyspnea Respiratory: denies cough, +shortness of breath, dyspnea on exertion, wheezing, hemoptysis Gastroenterology: denies abdominal pain, nausea, vomiting, diarrhea, constipation,  Hematology: denies bleeding or bruising problems Musculoskeletal: denies arthralgias, myalgias, joint swelling, back pain, neck pain, cramping, gait changes Ophthalmology: denies vision changes Urology: denies dysuria, difficulty urinating, hematuria, urinary frequency, urgency, incontinence Neurology: no headache, weakness, tingling, numbness, speech abnormality, memory loss, falls, dizziness    Past Medical History  Diagnosis Date  . Headache(784.0)     sinus  . Chondromalacia of left knee 09/2012  . Overweight   . Family history of  cancer     multiple family members, multiple types.   Sees Dr. Marin Olp  . Cancer 02/2005    testicular  . History of testicular cancer     Dr. Marin Olp   Past Surgical History  Procedure Laterality Date  . Radical orchiectomy  02/07/2005    left; with left testicular implant  . Anterior cervical decomp/discectomy fusion  11/11/2008    C5-6  . Knee arthroscopy      right  . Appendectomy    . Cardiac catheterization  04/13/2010    no disease  . Knee arthroscopy with lateral release Left 09/18/2012    Procedure: KNEE ARTHROSCOPY WITH LATERAL RELEASE;  Surgeon: [name], MD;  Location: [facility];  Service: Orthopedics;  Laterality: Left;  WITH DEBRIDEMENT AND SHAVING(CHONDROPLASTY), Excision Medial Plica  . Cervical spine surgery  2015    Dr. Deri Fuelling  . Colonoscopy      prior, no problems per patient, year unknown, possibly in early 57s.     Objective:  BP 130/90 mmHg  Pulse 76  Temp(Src) 97.8 F (36.6 C) (Oral)  Resp 15  Wt 226 lb (102.513 kg)  SpO2 98%  BP Readings from Last 3 Encounters:  10/19/14 130/90  06/20/14 147/100  04/20/14 100/82   Wt Readings from Last 3 Encounters:  10/19/14 226 lb (102.513 kg)  06/20/14 221 lb (100.245 kg)  04/20/14 207 lb (93.895 kg)    General appearance: alert, no distress, WD/WN, white male HEENT: normocephalic, conjunctiva/corneas normal, sclerae anicteric, PERRLA, EOMi, nares patent, no discharge or erythema, pharynx normal Oral cavity: MMM, tongue normal, teeth normal Neck: supple, no lymphadenopathy, no thyromegaly, no JVD, no bruits, no masses, normal ROM Chest: non tender, normal shape and expansion Heart: RRR, normal S1, S2, no murmurs Lungs: CTA bilaterally, no wheezes, rhonchi, or rales Abdomen: +bs, soft, non tender, non distended, no masses, no  hepatomegaly, no splenomegaly, no bruits Extremities: no edema, no cyanosis, no clubbing Pulses: 2+ symmetric, upper and lower extremities, normal cap  refill Neurological: alert, oriented x 3, CN2-12 intact, nonfocal exam Skin: mild patches of raised rough pink/red rash nonspecific bilat forearms Psychiatric: normal affect, behavior normal, pleasant      Adult ECG Report  Indication: chest pain  Rate: 82 bpm  Rhythm: normal sinus rhythm  QRS Axis: 56 degrees  PR Interval: 186ms  QRS Duration: 59ms  QTc: 415ms  Conduction Disturbances: RSR' suggestive right ventricular conduction delay  Other Abnormalities: none  Patient's cardiac risk factors are: dyslipidemia, hypertension and male gender.  EKG comparison: 2011   Narrative Interpretation: RSR' is new but otherwise no change     Assessment:   Encounter Diagnoses  Name Primary?  . Chest pain, unspecified chest pain type Yes  . Essential hypertension   . Rash and nonspecific skin eruption       Plan:   Chest pain - etiology unclear.  Sounds cardiac EKG, but not much change from 2011, and symptoms basically the same for 2 weeks.  Labs today, we'll send for chest x-ray, begin Dexilant in the off chance that this is GERD, possible referral to cardiology  Hypertension-new diagnosis today based on several prior results, begin Benicar daily.  Discussed risk and benefits of medication  Rash-begin triamcinolone topically cough not improved in the next 3-4 days  Call 911 if worse or severe chest pain

## 2014-10-21 ENCOUNTER — Ambulatory Visit
Admission: RE | Admit: 2014-10-21 | Discharge: 2014-10-21 | Disposition: A | Discharge status: Home / Self Care | Payer: BLUE CROSS/BLUE SHIELD | Source: Ambulatory Visit | Attending: Medical | Admitting: Medical

## 2014-10-21 DIAGNOSIS — R079 Chest pain, unspecified: Secondary | ICD-10-CM

## 2014-10-24 ENCOUNTER — Telehealth: Payer: Self-pay | Admitting: Medical

## 2014-10-24 ENCOUNTER — Other Ambulatory Visit: Payer: Self-pay | Admitting: Medical

## 2014-10-24 MED ORDER — METHYLPREDNISOLONE (PAK) 4 MG PO TABS: ORAL_TABLET | ORAL | Status: DC

## 2014-10-24 NOTE — Telephone Encounter (Signed)
See other message, and may need OV, but see other message

## 2014-10-24 NOTE — Telephone Encounter (Signed)
Please call  He is broke out in hives all over  Itching and burning, cant stand anything to touch  Believes it is allergic reaction to cream you gave. No trouble breathing, only hives  Please call ASAP

## 2014-12-05 ENCOUNTER — Ambulatory Visit (INDEPENDENT_AMBULATORY_CARE_PROVIDER_SITE_OTHER): Payer: BLUE CROSS/BLUE SHIELD | Admitting: Medical

## 2014-12-05 ENCOUNTER — Encounter: Payer: Self-pay | Admitting: Medical

## 2014-12-05 VITALS — BP 142/98 | HR 76 | Resp 15 | Wt 225.0 lb

## 2014-12-05 DIAGNOSIS — L239 Allergic contact dermatitis, unspecified cause: Secondary | ICD-10-CM

## 2014-12-05 DIAGNOSIS — I10 Essential (primary) hypertension: Secondary | ICD-10-CM | POA: Diagnosis not present

## 2014-12-05 DIAGNOSIS — L200 Besnier's prurigo: Secondary | ICD-10-CM

## 2014-12-05 DIAGNOSIS — S8012XA Contusion of left lower leg, initial encounter: Secondary | ICD-10-CM | POA: Diagnosis not present

## 2014-12-05 MED ORDER — METHYLPREDNISOLONE ACETATE 80 MG/ML IJ SUSP
80.0000 mg | Freq: Once | INTRAMUSCULAR | Status: AC
  Administered 2014-12-05: 80 mg via INTRAMUSCULAR

## 2014-12-05 NOTE — Progress Notes (Signed)
Subjective: Here for rash, since last visit never fully resolved.  No particular trigger.   Last visit only had rash on forearms.    He took the Medrol dosepak, almost resolved, but then rash recurred.  Now has rash on arms, legs, torso, itchy.  No chemical exposure, no recent yard work, no new foods . He does have dogs that go outside.  No particular allergy symptoms.  He did start taking benadryl this past week.  He attributes this rash to the 2 new medications we started last visit Nexium and Benicar although rash was present before starting these medications.  Also has swelling of lower front of leg where he hit his shin on a board over a week ago.   No calve pain or generalized swelling . No other aggravating or relieving factors. No other complaint.  Objective BP 142/98 mmHg  Pulse 76  Resp 15  Wt 225 lb (102.059 kg)  Gen: wd, wn, nad Heart: RRR, normal s1, s2, no murmurs Lungs clear Pulses normal Skin: diffuse irritated skin/pink/ red coloration with rough skin forearms and upper thighs, torso is more of a prickly flesh colored rash diffuse Left lower anterior leg with localized 4cm diameter area suggestive of hematoma resolving, no obvious calve tenderness swelling or asymmetry suggesting DVT   Assessment: Encounter Diagnoses  Name Primary?  . Allergic dermatitis Yes  . Essential hypertension   . Contusion of leg, left, initial encounter    Plan: Allergic dermatitis - likely pollen related.  C/t antihistamine OTC, 80mg  Depo Medrol IM today.  If rash recurs in the next monthly, consider allergist referral  HTN - if rash resolves and doesn't recur within the next few weeks, then restart Benicar  Leg contusion - resolving, reassured, but discussed signs/symptoms of DVT that would prompt recheck

## 2015-01-19 ENCOUNTER — Ambulatory Visit (INDEPENDENT_AMBULATORY_CARE_PROVIDER_SITE_OTHER): Payer: BLUE CROSS/BLUE SHIELD | Admitting: Medical

## 2015-01-19 ENCOUNTER — Emergency Department (HOSPITAL_COMMUNITY): Payer: BLUE CROSS/BLUE SHIELD

## 2015-01-19 ENCOUNTER — Telehealth: Payer: Self-pay | Admitting: Medical

## 2015-01-19 ENCOUNTER — Encounter: Payer: Self-pay | Admitting: Medical

## 2015-01-19 ENCOUNTER — Other Ambulatory Visit: Payer: Self-pay | Admitting: Medical

## 2015-01-19 ENCOUNTER — Emergency Department (HOSPITAL_COMMUNITY)
Admission: EM | Admit: 2015-01-19 | Discharge: 2015-01-19 | Disposition: A | Discharge status: Home / Self Care | Payer: BLUE CROSS/BLUE SHIELD | Attending: Emergency Medicine | Admitting: Emergency Medicine

## 2015-01-19 ENCOUNTER — Encounter (HOSPITAL_COMMUNITY): Payer: Self-pay | Admitting: *Deleted

## 2015-01-19 VITALS — BP 170/110 | HR 118 | Temp 98.4°F | Resp 20 | Wt 220.0 lb

## 2015-01-19 DIAGNOSIS — S39011A Strain of muscle, fascia and tendon of abdomen, initial encounter: Secondary | ICD-10-CM | POA: Insufficient documentation

## 2015-01-19 DIAGNOSIS — Z79899 Other long term (current) drug therapy: Secondary | ICD-10-CM | POA: Insufficient documentation

## 2015-01-19 DIAGNOSIS — Z791 Long term (current) use of non-steroidal anti-inflammatories (NSAID): Secondary | ICD-10-CM | POA: Insufficient documentation

## 2015-01-19 DIAGNOSIS — Y998 Other external cause status: Secondary | ICD-10-CM | POA: Insufficient documentation

## 2015-01-19 DIAGNOSIS — E663 Overweight: Secondary | ICD-10-CM | POA: Diagnosis not present

## 2015-01-19 DIAGNOSIS — R1030 Lower abdominal pain, unspecified: Secondary | ICD-10-CM | POA: Diagnosis not present

## 2015-01-19 DIAGNOSIS — X58XXXA Exposure to other specified factors, initial encounter: Secondary | ICD-10-CM | POA: Diagnosis not present

## 2015-01-19 DIAGNOSIS — I10 Essential (primary) hypertension: Secondary | ICD-10-CM | POA: Insufficient documentation

## 2015-01-19 DIAGNOSIS — R1031 Right lower quadrant pain: Secondary | ICD-10-CM | POA: Diagnosis not present

## 2015-01-19 DIAGNOSIS — Z8547 Personal history of malignant neoplasm of testis: Secondary | ICD-10-CM | POA: Diagnosis not present

## 2015-01-19 DIAGNOSIS — Y9389 Activity, other specified: Secondary | ICD-10-CM | POA: Insufficient documentation

## 2015-01-19 DIAGNOSIS — K4090 Unilateral inguinal hernia, without obstruction or gangrene, not specified as recurrent: Secondary | ICD-10-CM | POA: Diagnosis not present

## 2015-01-19 DIAGNOSIS — Y9289 Other specified places as the place of occurrence of the external cause: Secondary | ICD-10-CM | POA: Insufficient documentation

## 2015-01-19 DIAGNOSIS — S76219A Strain of adductor muscle, fascia and tendon of unspecified thigh, initial encounter: Secondary | ICD-10-CM

## 2015-01-19 DIAGNOSIS — R109 Unspecified abdominal pain: Secondary | ICD-10-CM

## 2015-01-19 LAB — CBC WITH DIFFERENTIAL/PLATELET
BASOS ABS: 0 10*3/uL (ref 0.0–0.1)
Basophils Relative: 0 % (ref 0–1)
EOS ABS: 0.2 10*3/uL (ref 0.0–0.7)
EOS PCT: 1 % (ref 0–5)
HCT: 44.1 % (ref 39.0–52.0)
Hemoglobin: 15.5 g/dL (ref 13.0–17.0)
Lymphocytes Relative: 10 % — ABNORMAL LOW (ref 12–46)
Lymphs Abs: 1.6 10*3/uL (ref 0.7–4.0)
MCH: 30.8 pg (ref 26.0–34.0)
MCHC: 35.1 g/dL (ref 30.0–36.0)
MCV: 87.5 fL (ref 78.0–100.0)
Monocytes Absolute: 1.8 10*3/uL — ABNORMAL HIGH (ref 0.1–1.0)
Monocytes Relative: 11 % (ref 3–12)
Neutro Abs: 12.2 10*3/uL — ABNORMAL HIGH (ref 1.7–7.7)
Neutrophils Relative %: 78 % — ABNORMAL HIGH (ref 43–77)
PLATELETS: 275 10*3/uL (ref 150–400)
RBC: 5.04 MIL/uL (ref 4.22–5.81)
RDW: 12.5 % (ref 11.5–15.5)
WBC: 15.8 10*3/uL — AB (ref 4.0–10.5)

## 2015-01-19 LAB — BASIC METABOLIC PANEL
Anion gap: 9 (ref 5–15)
BUN: 9 mg/dL (ref 6–20)
CHLORIDE: 103 mmol/L (ref 101–111)
CO2: 24 mmol/L (ref 22–32)
Calcium: 8.6 mg/dL — ABNORMAL LOW (ref 8.9–10.3)
Creatinine, Ser: 1.03 mg/dL (ref 0.61–1.24)
GFR calc Af Amer: >60 mL/min (ref >60)
Glucose, Bld: 110 mg/dL — ABNORMAL HIGH (ref 65–99)
POTASSIUM: 4.4 mmol/L (ref 3.5–5.1)
Sodium: 136 mmol/L (ref 135–145)

## 2015-01-19 LAB — URINALYSIS, ROUTINE W REFLEX MICROSCOPIC
BILIRUBIN URINE: NEGATIVE
Glucose, UA: 250 mg/dL — AB
Hgb urine dipstick: NEGATIVE
KETONES UR: NEGATIVE mg/dL
LEUKOCYTES UA: NEGATIVE
NITRITE: NEGATIVE
PH: 5.5 (ref 5.0–8.0)
Protein, ur: NEGATIVE mg/dL
SPECIFIC GRAVITY, URINE: 1.019 (ref 1.005–1.030)
Urobilinogen, UA: 0.2 mg/dL (ref 0.0–1.0)

## 2015-01-19 MED ORDER — SODIUM CHLORIDE 0.9 % IV SOLN
INTRAVENOUS | Status: DC
  Administered 2015-01-19: 12:00:00 via INTRAVENOUS

## 2015-01-19 MED ORDER — ONDANSETRON HCL 4 MG/2ML IJ SOLN
4.0000 mg | Freq: Once | INTRAMUSCULAR | Status: AC
  Administered 2015-01-19: 4 mg via INTRAVENOUS
  Filled 2015-01-19: qty 2

## 2015-01-19 MED ORDER — IOHEXOL 300 MG/ML  SOLN
100.0000 mL | Freq: Once | INTRAMUSCULAR | Status: AC | PRN
  Administered 2015-01-19: 100 mL via INTRAVENOUS

## 2015-01-19 MED ORDER — SODIUM CHLORIDE 0.9 % IV BOLUS (SEPSIS)
1000.0000 mL | Freq: Once | INTRAVENOUS | Status: AC
  Administered 2015-01-19: 1000 mL via INTRAVENOUS

## 2015-01-19 MED ORDER — FENTANYL CITRATE (PF) 100 MCG/2ML IJ SOLN
50.0000 ug | Freq: Once | INTRAMUSCULAR | Status: AC
  Administered 2015-01-19: 50 ug via INTRAVENOUS
  Filled 2015-01-19: qty 2

## 2015-01-19 MED ORDER — LISINOPRIL-HYDROCHLOROTHIAZIDE 20-12.5 MG PO TABS: 1.0000 | ORAL_TABLET | Freq: Every day | ORAL | Status: DC

## 2015-01-19 MED ORDER — NAPROXEN 500 MG PO TABS: 500.0000 mg | ORAL_TABLET | Freq: Two times a day (BID) | ORAL | Status: DC

## 2015-01-19 MED ORDER — IOHEXOL 300 MG/ML  SOLN
25.0000 mL | Freq: Once | INTRAMUSCULAR | Status: AC | PRN
  Administered 2015-01-19: 25 mL via ORAL

## 2015-01-19 NOTE — ED Notes (Signed)
Pt sent here by PA  Dorothea Ogle at [facility] (Dr Beverly Gust office) ofr possible strangulated inguinal hernia, R.

## 2015-01-19 NOTE — ED Notes (Signed)
Finished with PO contrast. Notified CT. 

## 2015-01-19 NOTE — ED Notes (Signed)
Patient returned from CT

## 2015-01-19 NOTE — Telephone Encounter (Signed)
Please let him know that I reviewed the ED report.   I am surprised there wasn't anything worse noted on CT, nevertheless, I hope it was just a strained muscle albeit severe.  See what the ED advised he do.  Regarding BP, have him start Lisinopril HCT for blood pressure.  Stop or don't restart the other medication from prior visit given possible allergic reaction/rash the had.   Have him f/u in 3-4 wk on BP

## 2015-01-19 NOTE — ED Provider Notes (Signed)
CSN: [id number]     Arrival date & time 01/19/15  0973 History   First MD Initiated Contact with Patient 01/19/15 [phone]     Chief Complaint  Patient presents with  . Hernia     (Consider location/radiation/quality/duration/timing/severity/associated sxs/prior Treatment) The history is provided by the patient.   39 year old male with one-week history of right groin right lower quadrant abdominal pain. Spent constant associated with nausea but no vomiting no diarrhea no fever. No bulge into the groin area. Patient had his left testicle removed for cancer several years ago and has implant. Denies any testicular pain at this point in time. Was seen by primary care doctor today referred in for concerns for strangulated right inguinal hernia. Patient states that the pain is worse is 10 out of 10. Currently about 8 out of 10. Made worse with movements of sitting up.  Past Medical History  Diagnosis Date  . Headache(784.0)     sinus  . Chondromalacia of left knee 09/2012  . Overweight   . Family history of cancer     multiple family members, multiple types.   Sees Dr. Marin Olp  . Cancer 02/2005    testicular  . History of testicular cancer     Dr. Marin Olp  . Hypertension    Past Surgical History  Procedure Laterality Date  . Radical orchiectomy  02/07/2005    left; with left testicular implant  . Anterior cervical decomp/discectomy fusion  11/11/2008    C5-6  . Knee arthroscopy      right  . Appendectomy    . Cardiac catheterization  04/13/2010    no disease  . Knee arthroscopy with lateral release Left 09/18/2012    Procedure: KNEE ARTHROSCOPY WITH LATERAL RELEASE;  Surgeon: [name], MD;  Location: [facility];  Service: Orthopedics;  Laterality: Left;  WITH DEBRIDEMENT AND SHAVING(CHONDROPLASTY), Excision Medial Plica  . Cervical spine surgery  2015    Dr. Deri Fuelling  . Colonoscopy      prior, no problems per patient, year unknown, possibly in early 24s.   Family  History  Problem Relation Age of Onset  . Cancer Mother     gynecological  . Hypertension Mother   . Stroke Mother   . Migraines Mother   . Cancer Father     lung  . Hypertension Father   . Heart attack Father 23  . Cancer Sister 44    multiple myeloma  . Hypertension Sister   . Migraines Sister   . Diabetes Maternal Uncle    History  Substance Use Topics  . Smoking status: Never Smoker   . Smokeless tobacco: Never Used     Comment: never used tobacco  . Alcohol Use: 0.0 oz/week     Comment: occasionally    Review of Systems  Constitutional: Negative for fever.  HENT: Negative for congestion.   Eyes: Negative for redness.  Respiratory: Negative for shortness of breath.   Cardiovascular: Negative for chest pain.  Gastrointestinal: Positive for nausea and abdominal pain.  Genitourinary: Negative for dysuria, scrotal swelling and testicular pain.  Musculoskeletal: Negative for back pain.  Skin: Negative for rash.  Neurological: Negative for headaches.  Hematological: Does not bruise/bleed easily.  Psychiatric/Behavioral: Negative for confusion.      Allergies  Benicar and Nexium  Home Medications   Prior to Admission medications   Medication Sig Start Date End Date Taking? Authorizing Provider  acetaminophen (TYLENOL) 500 MG tablet Take 1,000 mg by mouth every  6 (six) hours as needed for mild pain or moderate pain.   Yes Historical Provider, MD  cetirizine (ZYRTEC) 10 MG tablet Take 10 mg by mouth daily as needed for allergies.   Yes Historical Provider, MD  dexlansoprazole (DEXILANT) 60 MG capsule Take 1 capsule (60 mg total) by mouth daily. Patient not taking: Reported on 12/05/2014 10/19/14   Camelia Eng Tysinger, PA-C  methylPREDNIsolone (MEDROL DOSPACK) 4 MG tablet follow package directions Patient not taking: Reported on 12/05/2014 10/24/14   Camelia Eng Tysinger, PA-C  naproxen (NAPROSYN) 500 MG tablet Take 1 tablet (500 mg total) by mouth 2 (two) times daily. 01/19/15    Fredia Sorrow, MD  olmesartan (BENICAR) 20 MG tablet Take 1 tablet (20 mg total) by mouth daily. Patient not taking: Reported on 12/05/2014 10/19/14   Camelia Eng Tysinger, PA-C  triamcinolone cream (KENALOG) 0.1 % Apply 1 application topically 2 (two) times daily. Patient not taking: Reported on 12/05/2014 10/19/14   Camelia Eng Tysinger, PA-C   BP 130/83 mmHg  Pulse 93  Temp(Src) 98.2 F (36.8 C) (Oral)  Resp 20  Ht '5\' 10"'  (1.778 m)  Wt 220 lb (99.791 kg)  BMI 31.57 kg/m2  SpO2 90% Physical Exam  Constitutional: He is oriented to person, place, and time. He appears well-developed and well-nourished. No distress.  HENT:  Head: Normocephalic and atraumatic.  Eyes: Conjunctivae and EOM are normal. Pupils are equal, round, and reactive to light.  Neck: Normal range of motion.  Cardiovascular: Normal rate, regular rhythm and normal heart sounds.   No murmur heard. Pulmonary/Chest: Effort normal and breath sounds normal.  Abdominal: Soft. Bowel sounds are normal. There is no tenderness.  Tenderness right groin area no significant right lower quadrant tenderness.  Genitourinary: Penis normal.  No groin hernia. Left testicle removed. Has implant in place. Right testicle nontender no swelling. No fullness to the scrotum. No inguinal hernia. Tenderness to palpation over the right groin area.  Musculoskeletal: Normal range of motion. He exhibits no edema.  Neurological: He is alert and oriented to person, place, and time. No cranial nerve deficit. He exhibits normal muscle tone. Coordination normal.  Skin: Skin is warm. No erythema.  Nursing note and vitals reviewed.   ED Course  Procedures (including critical care time) Labs Review Labs Reviewed  CBC WITH DIFFERENTIAL/PLATELET - Abnormal; Notable for the following:    WBC 15.8 (*)    Neutrophils Relative % 78 (*)    Neutro Abs 12.2 (*)    Lymphocytes Relative 10 (*)    Monocytes Absolute 1.8 (*)    All other components within normal limits   BASIC METABOLIC PANEL - Abnormal; Notable for the following:    Glucose, Bld 110 (*)    Calcium 8.6 (*)    All other components within normal limits  URINALYSIS, ROUTINE W REFLEX MICROSCOPIC (NOT AT [facility]) - Abnormal; Notable for the following:    Glucose, UA 250 (*)    All other components within normal limits    Imaging Review Ct Abdomen Pelvis W Contrast  01/19/2015   CLINICAL DATA:  Lower abdominal/ pelvis pain for 1 week. History of testicular carcinoma 10 years prior  EXAM: CT ABDOMEN AND PELVIS WITH CONTRAST  TECHNIQUE: Multidetector CT imaging of the abdomen and pelvis was performed using the standard protocol following bolus administration of intravenous contrast. Oral contrast was also administered.  CONTRAST:  156m OMNIPAQUE IOHEXOL 300 MG/ML  SOLN  COMPARISON:  April 12, 2014  FINDINGS: There is bibasilar atelectatic  change. There is a stable 2 mm nodular opacity in the inferior lingula seen on axial slice 6 series 3. There is a stable 3 mm nodular opacity in the lateral segment of the left lower lobe seen on this same slice. There is a stable 4 mm nodular opacity in the posterior segment of the right lower lobe seen on axial slice 10 series 3. No new parenchymal lung nodular lesions are identified.  There is hepatic steatosis. No focal liver lesions are identified. The gallbladder wall is not thickened. There is no biliary duct dilatation.  Spleen, pancreas, and adrenals appear normal. Kidneys bilaterally show no mass or hydronephrosis on either side. There is no renal or ureteral calculus on either side.  In the pelvis, the urinary bladder is midline with normal wall thickness. The patient has had an orchidectomy on the left with a prosthesis placed in the left scrotum. There is no pelvic mass or pelvic fluid collection. Appendix is absent. There is no periappendiceal region inflammation.  There is no bowel obstruction.  No free air or portal venous air.  There is a stable 9 x 8 mm  lymph node slightly posterior to the body of the pancreas. There is a stable 7 x 6 mm lymph node slightly medial to the left adrenal. By size criteria, there is no adenopathy in the abdomen or pelvis. Tiny inguinal lymph nodes bilaterally are stable. There is no ascites or abscess in the abdomen or pelvis. Aorta appears normal. There are no lytic bone lesions. There is a stable apparent bone island in the inferior right iliac crest. No new sclerotic lesions are identified in the visualized bony structures. There are pars defects at L5 bilaterally with slight anterolisthesis of L5 on S1.  IMPRESSION: There are stable small pulmonary nodular opacities in the lung bases as well as stable subcentimeter lymph nodes in the upper abdomen. Lack of progression of these findings is felt to be consistent with benign etiology. No recurrent neoplasia identified.  Patient is status post left orchiectomy with a prosthesis present.  There is no inflammatory focus appreciable. No bowel obstruction. No abscess. Appendix is absent. No renal or ureteral calculus. No hydronephrosis.  There is persistent hepatic steatosis.   Electronically Signed   By: Lowella Grip III M.D.   On: 01/19/2015 12:21     EKG Interpretation None      MDM   Final diagnoses:  Abdominal pain  Groin strain, initial encounter    Patient with a right groin pain right lower quadrant abdominal pain for a week. Referred in from primary care doctor's office for possible inguinal hernia. No evidence of any hernia on exam. CT scan of the abdomen shows no femoral hernia or any other acute intra-abdominal process. May very well represent a groin pull. There is no right-sided testicular tenderness or swelling. Patient's had his left testicle removed and has an implant in that area. Urinalysis also negative. Will treat with anti-inflammatory medicine and rest.  Fredia Sorrow, MD 01/19/15 1251

## 2015-01-19 NOTE — ED Notes (Signed)
Patient transported to CT 

## 2015-01-19 NOTE — Progress Notes (Signed)
Subjective: Chief Complaint  Patient presents with  . right lower quad pain    RLQ pain started 1 week ago when he climbed onto a machine at work, pain has progressively gotten worse increased in severity significantly the last 24 hours, complaints of nausea, no appetite, not sleeping, pain hurts worse when he bounces, rides in a car or takes a step, pain is also worse when he sits up from lying position and when he coughs or sneezes   Here for RLQ abdominal pain.  He has hx/o hypertension, testicular cancer, hx/o appendectomy. He was at work climbing onto a machine about a week ago, got some pain then in RLQ.  Over the course of this week, the pain has gradually worsened to now is in severe pain.   Any movement seems to worsen the pain.   Pain is 10/10, brining him to tears.   Last BM was yesterday and seemed normal.  Denies diarrhea, blood in stool, constipation, no urinary changes, no urinary c/o, no fever, no back pain, no scrotal pain.   No trauma, no other injury.   He does report no appetite and nausea, no vomiting.  Of note he had stopped BP medication last visit due to possible allergic reaction,hasn't restarted this.  No other aggravating or relieving factors. No other complaint.   Past Medical History  Diagnosis Date  . Headache(784.0)     sinus  . Chondromalacia of left knee 09/2012  . Overweight   . Family history of cancer     multiple family members, multiple types.   Sees Dr. Marin Olp  . Cancer 02/2005    testicular  . History of testicular cancer     Dr. Marin Olp   Past Surgical History  Procedure Laterality Date  . Radical orchiectomy  02/07/2005    left; with left testicular implant  . Anterior cervical decomp/discectomy fusion  11/11/2008    C5-6  . Knee arthroscopy      right  . Appendectomy    . Cardiac catheterization  04/13/2010    no disease  . Knee arthroscopy with lateral release Left 09/18/2012    Procedure: KNEE ARTHROSCOPY WITH LATERAL RELEASE;  Surgeon: [name], MD;  Location: [facility];  Service: Orthopedics;  Laterality: Left;  WITH DEBRIDEMENT AND SHAVING(CHONDROPLASTY), Excision Medial Plica  . Cervical spine surgery  2015    Dr. Deri Fuelling  . Colonoscopy      prior, no problems per patient, year unknown, possibly in early 29s.    Objective: BP 170/110 mmHg  Pulse 118  Temp(Src) 98.4 F (36.9 C) (Oral)  Resp 20  Wt 220 lb (99.791 kg)  General appearance: alert, in severe pain, crying Heart: RRR, normal S1, S2, no murmurs Lungs: CTA bilaterally, no wheezes, rhonchi, or rales Abdomen: +bs, soft, non tender, non distended, no masses, no hepatomegaly, no splenomegaly Back: nontender Abdomen: very tender RLQ abdomen and right inguinal region generalized, in too much pain to allow any other GU exam, reduced bowel sounds, left side abdomen nontender Pulses: 2+ symmetric, upper and lower extremities, normal cap refill Ext: no edema    Assessment: Encounter Diagnoses  Name Primary?  . RLQ abdominal pain Yes  . Severe right groin pain   . Essential hypertension     Plan: RLQ abdominal pain, severe right groin pain - possible strangulated vs incarcerated hernia, severe pain, hx/o appendectomy.  Differential could include bowel obstruction or diverticulitis, but hernia seems more likely.   Called his  wife Lawerance Bach and she will come and take him to the ED now.  Hypertension - he will need to restart BP medication.  Will avoid ARB given possible allergic reaction.  Consider beta blocker or ACEi.

## 2015-01-19 NOTE — Discharge Instructions (Signed)
Workup without any significant findings. May very well be a muscle pull. Take the Naprosyn as directed. Follow-up with your regular doctor. Return for any new or worse symptoms. No evidence of any intra-abdominal or pelvic hernia no evidence of any intra-abdominal process.

## 2015-01-20 NOTE — Telephone Encounter (Signed)
Patient is aware of the message from [employer] PA and his recommendations and the patient understood.

## 2015-03-27 ENCOUNTER — Telehealth: Payer: Self-pay

## 2015-03-27 ENCOUNTER — Other Ambulatory Visit: Payer: Self-pay | Admitting: Medical

## 2015-03-27 MED ORDER — LISINOPRIL-HYDROCHLOROTHIAZIDE 20-12.5 MG PO TABS: 1.0000 | ORAL_TABLET | Freq: Every day | ORAL | Status: DC

## 2015-03-27 NOTE — Telephone Encounter (Signed)
Received refill request for Lisinopril - HCTZ 20-12.5 mg #90.

## 2015-04-21 ENCOUNTER — Other Ambulatory Visit: Payer: Self-pay | Admitting: Medical

## 2015-06-02 ENCOUNTER — Ambulatory Visit: Payer: BLUE CROSS/BLUE SHIELD | Admitting: Medical

## 2015-06-08 ENCOUNTER — Encounter: Payer: Self-pay | Admitting: Medical

## 2015-06-08 ENCOUNTER — Ambulatory Visit (INDEPENDENT_AMBULATORY_CARE_PROVIDER_SITE_OTHER): Payer: BLUE CROSS/BLUE SHIELD | Admitting: Medical

## 2015-06-08 VITALS — BP 110/80 | HR 73 | Temp 97.7°F | Wt 220.0 lb

## 2015-06-08 DIAGNOSIS — K921 Melena: Secondary | ICD-10-CM

## 2015-06-08 LAB — CBC WITH DIFFERENTIAL/PLATELET
Basophils Absolute: 0.1 10*3/uL (ref 0.0–0.1)
Basophils Relative: 1 % (ref 0–1)
Eosinophils Absolute: 0.3 10*3/uL (ref 0.0–0.7)
Eosinophils Relative: 6 % — ABNORMAL HIGH (ref 0–5)
HEMATOCRIT: 45 % (ref 39.0–52.0)
HEMOGLOBIN: 15.4 g/dL (ref 13.0–17.0)
Lymphocytes Relative: 28 % (ref 12–46)
Lymphs Abs: 1.6 10*3/uL (ref 0.7–4.0)
MCH: 31.2 pg (ref 26.0–34.0)
MCHC: 34.2 g/dL (ref 30.0–36.0)
MCV: 91.1 fL (ref 78.0–100.0)
MPV: 9.8 fL (ref 8.6–12.4)
Monocytes Absolute: 0.7 10*3/uL (ref 0.1–1.0)
Monocytes Relative: 12 % (ref 3–12)
NEUTROS ABS: 3.1 10*3/uL (ref 1.7–7.7)
NEUTROS PCT: 53 % (ref 43–77)
Platelets: 308 10*3/uL (ref 150–400)
RBC: 4.94 MIL/uL (ref 4.22–5.81)
RDW: 12.4 % (ref 11.5–15.5)
WBC: 5.8 10*3/uL (ref 4.0–10.5)

## 2015-06-08 MED ORDER — HYDROCORTISONE ACETATE 25 MG RE SUPP: 25.0000 mg | Freq: Two times a day (BID) | RECTAL | Status: DC

## 2015-06-08 NOTE — Progress Notes (Signed)
Subjective: Chief Complaint  Patient presents with  . bloody stool    started over a month ago. no other concerns   Here for c/o blood in stool x 1 month.  He notes seeing [facility] every other time he has BM.  Usually on the toilet paper but also mixed in with stool, in bowl as well.  Sometimes has rectal discomfort.   Wife made him come in. Doesn't feel faint or lightheaded.  Denies any other recent abdominal pain or bowel problems.  Denies currently using aspirin or other blood thinners.  No other aggravating or relieving factors.  No other complaint.  Past Medical History  Diagnosis Date  . Headache(784.0)     sinus  . Chondromalacia of left knee 09/2012  . Overweight   . Family history of cancer     multiple family members, multiple types.   Sees Dr. Marin Olp  . Cancer ([place] [facility]) 02/2005    testicular  . History of testicular cancer     Dr. Marin Olp  . Hypertension    ROS as in subjective  Objective: BP 110/80 mmHg  Pulse 73  Temp(Src) 97.7 F (36.5 C) (Oral)  Wt 220 lb (99.791 kg)  SpO2 98%  Gen: wd, wn, nad Skin: warm, dry Abdomen: +bs, soft, nontender, no mass, no hepatosplenomegaly DRE:  Pink irritated skin perirectal, but no fissure, nontender anus, no obvious external hemorrhoids or bleeding Anoscopy - internal non bleeding hemorrhoids, no other obvious blood, hemoccult negative stool.  Pt had some discomfort with digital rectal exam and anoscopy    Assessment: Encounter Diagnosis  Name Primary?  . Blood in stool Yes     Plan: Discussed concerns, symptoms, lack of blood on exam and anoscopy.   CBC lab today.  Orthostatic vitals reviewed.    Will likely need colonoscopy referral.  In the meantime begin hydrocortisone suppositories.   Scott Moss was seen today for bloody stool.  Diagnoses and all orders for this visit:  Blood in stool -     Orthostatic vital signs -     CBC with Differential/Platelet  Other orders -     hydrocortisone (ANUSOL-HC) 25 MG suppository;  Place 1 suppository (25 mg total) rectally 2 (two) times daily.

## 2015-06-08 NOTE — Addendum Note (Signed)
Addended by: Carlena Hurl on: 06/08/2015 08:31 AM   Modules accepted: Orders

## 2015-06-09 ENCOUNTER — Encounter: Payer: Self-pay | Admitting: Gastroenterology

## 2015-06-09 NOTE — Addendum Note (Signed)
Addended by: Deforest Hoyles D on: 06/09/2015 09:10 AM   Modules accepted: Orders

## 2015-06-20 ENCOUNTER — Ambulatory Visit (INDEPENDENT_AMBULATORY_CARE_PROVIDER_SITE_OTHER): Payer: BLUE CROSS/BLUE SHIELD | Admitting: Gastroenterology

## 2015-06-20 ENCOUNTER — Encounter: Payer: Self-pay | Admitting: Gastroenterology

## 2015-06-20 ENCOUNTER — Telehealth: Payer: Self-pay | Admitting: Medical

## 2015-06-20 VITALS — BP 110/82 | HR 92 | Ht 69.0 in | Wt 217.2 lb

## 2015-06-20 DIAGNOSIS — K625 Hemorrhage of anus and rectum: Secondary | ICD-10-CM | POA: Insufficient documentation

## 2015-06-20 DIAGNOSIS — R0789 Other chest pain: Secondary | ICD-10-CM

## 2015-06-20 DIAGNOSIS — I10 Essential (primary) hypertension: Secondary | ICD-10-CM

## 2015-06-20 MED ORDER — HYDROCORTISONE ACE-PRAMOXINE 2.5-1 % RE CREA: TOPICAL_CREAM | RECTAL | Status: DC

## 2015-06-20 NOTE — Patient Instructions (Signed)
We sent a prescription to [facility], [place].  1. Analpram HC 2.5 % Cream.   We have given you rectal care instructions.

## 2015-06-20 NOTE — Telephone Encounter (Signed)
Lets refer back to cardiology for eval since blood pressures have been up and down, and he did come in for chest pain a few months back.  He has had cardiology eval back in 2011.   Look in chart to determine who he saw and refer back there.

## 2015-06-20 NOTE — Progress Notes (Signed)
06/20/2015 Scott Moss [id number] July 04, 1976   HISTORY OF PRESENT ILLNESS:  This is a 39 year old white male who is new to our practice and referred here by his PCP, Dorothea Ogle., PA-C, for evaluation of rectal bleeding.  Has been seeing some bright red blood on and off for the past year or so but more frequently over the past month.  Describes this as bright red blood with BM's seen mostly on the toilet paper.  Has some anal discomfort with BM's as well.  Denies any abdominal pain or other GI complaints.  Hgb is normal/stable at 15.4 grams.  Has family history of colon cancer in maternal uncle.   Past Medical History  Diagnosis Date  . Headache(784.0)     sinus  . Chondromalacia of left knee 09/2012  . Overweight   . Family history of cancer     multiple family members, multiple types.   Sees Dr. Marin Olp  . Testicular cancer ([facility]) 02/2005    Dr. Marin Olp  . Hypertension   . HTN (hypertension)    Past Surgical History  Procedure Laterality Date  . Radical orchiectomy  02/07/2005    left; with left testicular implant  . Anterior cervical decomp/discectomy fusion  11/11/2008    C5-6  . Knee arthroscopy Right   . Appendectomy    . Cardiac catheterization  04/13/2010    no disease  . Knee arthroscopy with lateral release Left 09/18/2012    Procedure: KNEE ARTHROSCOPY WITH LATERAL RELEASE;  Surgeon: [name], MD;  Location: [facility];  Service: Orthopedics;  Laterality: Left;  WITH DEBRIDEMENT AND SHAVING(CHONDROPLASTY), Excision Medial Plica  . Cervical spine surgery  2015    Dr. Deri Fuelling  . Colonoscopy      prior, no problems per patient, year unknown, possibly in early 58s.  . Nasal sinus surgery  2014    reports that he has never smoked. He has never used smokeless tobacco. He reports that he drinks about 8.4 oz of alcohol per week. He reports that he does not use illicit drugs. family history includes Cancer (age of onset: 29) in his sister;  Diabetes in his maternal uncle; Heart attack (age of onset: 63) in his father; Hypertension in his father, mother, and sister; Lung cancer in his father; Migraines in his mother and sister; Ovarian cancer in his mother; Skin cancer in his father; Stroke in his mother. Allergies  Allergen Reactions  . Benicar [Olmesartan]     Rash, allergic reaction  . Nexium [Esomeprazole Magnesium]     Rash, allergic reaction      Outpatient Encounter Prescriptions as of 06/20/2015  Medication Sig  . acetaminophen (TYLENOL) 500 MG tablet Take 1,000 mg by mouth every 6 (six) hours as needed for mild pain or moderate pain.  [name] aspirin 81 MG tablet Take 81 mg by mouth daily.  [name] lisinopril-hydrochlorothiazide (PRINZIDE,ZESTORETIC) 20-12.5 MG per tablet TAKE 1 TABLET BY MOUTH DAILY.  . [DISCONTINUED] cetirizine (ZYRTEC) 10 MG tablet Take 10 mg by mouth daily as needed for allergies.  . [DISCONTINUED] dexlansoprazole (DEXILANT) 60 MG capsule Take 1 capsule (60 mg total) by mouth daily. (Patient not taking: Reported on 12/05/2014)  . [DISCONTINUED] hydrocortisone (ANUSOL-HC) 25 MG suppository Place 1 suppository (25 mg total) rectally 2 (two) times daily.  . [DISCONTINUED] lisinopril-hydrochlorothiazide (PRINZIDE,ZESTORETIC) 20-12.5 MG per tablet Take 1 tablet by mouth daily.  . [DISCONTINUED] methylPREDNIsolone (MEDROL DOSPACK) 4 MG tablet follow package directions (Patient not taking:  Reported on 12/05/2014)  . [DISCONTINUED] naproxen (NAPROSYN) 500 MG tablet Take 1 tablet (500 mg total) by mouth 2 (two) times daily. (Patient not taking: Reported on 06/08/2015)  . [DISCONTINUED] triamcinolone cream (KENALOG) 0.1 % Apply 1 application topically 2 (two) times daily. (Patient not taking: Reported on 12/05/2014)   No facility-administered encounter medications on file as of 06/20/2015.     REVIEW OF SYSTEMS  : All other systems reviewed and negative except where noted in the History of Present Illness.   PHYSICAL  EXAM: BP 110/82 mmHg  Pulse 92  Ht 5\' 9"  (1.753 m)  Wt 217 lb 4 oz (98.544 kg)  BMI 32.07 kg/m2 General: Well developed white male in no acute distress Head: Normocephalic and atraumatic Eyes:  Sclerae anicteric, conjunctiva pink. Ears: Normal auditory acuity Lungs: Clear throughout to auscultation Heart: Regular rate and rhythm Abdomen: Soft, non-distended.  Normal bowel sounds.  Non-tender.   Rectal:  No external hemorrhoids noted, but there was two small tears/excoriations at right posterior location that were slightly tender on exam.  DRE did not reveal any masses and there was light brown heme negative stool on exam glove.  Anoscopy revealed non-bleeding internal hemorrhoids. Musculoskeletal: Symmetrical with no gross deformities  Skin: No lesions on visible extremities Extremities: No edema  Neurological: Alert oriented x 4, grossly non-focal Psychological:  Alert and cooperative. Normal mood and affect  ASSESSMENT AND PLAN: -Rectal bleeding:  Very likely secondary to anal excoriation/tears seen on exam.  Will treat with analpram cream 2.5% BID for 10-14 days to start.  I am not convinced that these are fissures but patient will call back and give [place] an update in 2 weeks and if he is still having symptoms then may want to consider a trial of diltiazem gel.  Also discussed rectal care instructions especially avoiding aggressive hygiene, etc.      CC:  Tysinger, Camelia Eng, PA-C

## 2015-06-20 NOTE — Telephone Encounter (Signed)
Pt requesting a referral to get echocardiogram for shortness of breath, sweating, tiredness. Pt states he has not seen Audelia Acton for this but he saw [facility] today where Audelia Acton referred pt and that doctor advised pt to call [facility] Acton and request referral to get his heart checked through echocardiogram.

## 2015-06-20 NOTE — Progress Notes (Signed)
Reviewed and agree with management plan.  Jamaria Amborn T. Roddy Bellamy, MD FACG 

## 2015-06-21 NOTE — Telephone Encounter (Signed)
Referred back to [facility] he seen Dr Angelena Form in 2011

## 2015-07-05 NOTE — Progress Notes (Signed)
Cardiology Office Note   Date:  07/06/2015   ID:  Scott Moss, DOB 1976/06/24, MRN [id number]  PCP:  Crisoforo Oxford, PA-C  Cardiologist:   Sharol Harness, MD   Chief Complaint  Patient presents with  . New Evaluation    pt c/o chest pain-happens with stress, he gets a sharp pain in chest that runs down left arm  . Shortness of Breath    constant, started recently  . Excessive Sweating    started a couple of years ago.  . Edema    in bilateral lower legs      History of Present Illness: Scott Moss is a 39 y.o. male with hypertension and testicular cancer s/p orchiectomy who presents for an evaluation of chest pain and hypertension.  Scott Moss reports several years of chest painand shortness of breath. He underwent cardiac catheterization in 2011 that revealed no obstructive coronary disease. Since then he had one stress test several years ago that was reportedly negative for ischemia.  For the last 2 years he is noted intense diaphoresis with minimal exertion. Even with walking down the stairs he will be drenched with sweat.  After noticing that he started to notice increased shortness of breath with exertion. He sometimes even gets short of breath with just sitting.  He has been feeling worse over the last year.  He denies any palpitations, lightheadedness, or dizziness. He does sometimes no lower extremity edema and tightness in his calves. This has improved since he was started on hydrochlorothiazide and lisinopril for blood pressure control. He does also note constant fatigue.  After his orchiectomy Scott Moss was offered topical testosterone. However he declined to take this medication.  Scott Moss underwent cervical fusion, which helped with his chest and arm pain in the past. However, his symptoms have recurred and occur both at rest and with exertion.  His father had his first heart attack in his 86s and several others until he died of cancer in his 74s. Several of  his uncles also had premature coronary artery disease.   Past Medical History  Diagnosis Date  . Headache(784.0)     sinus  . Chondromalacia of left knee 09/2012  . Overweight   . Family history of cancer     multiple family members, multiple types.   Sees Dr. Marin Olp  . Testicular cancer ([place]) 02/2005    Dr. Marin Olp  . Hypertension   . HTN (hypertension)   . Atypical chest pain 07/06/2015    Non-obstructive CAD on [facility] 04/2010.    Past Surgical History  Procedure Laterality Date  . Radical orchiectomy  02/07/2005    left; with left testicular implant  . Anterior cervical decomp/discectomy fusion  11/11/2008    C5-6  . Knee arthroscopy Right   . Appendectomy    . Cardiac catheterization  04/13/2010    no disease  . Knee arthroscopy with lateral release Left 09/18/2012    Procedure: KNEE ARTHROSCOPY WITH LATERAL RELEASE;  Surgeon: [name], MD;  Location: [facility];  Service: Orthopedics;  Laterality: Left;  WITH DEBRIDEMENT AND SHAVING(CHONDROPLASTY), Excision Medial Plica  . Cervical spine surgery  2015    Dr. Deri Moss  . Colonoscopy      prior, no problems per patient, year unknown, possibly in early 40s.  . Nasal sinus surgery  2014     Current Outpatient Prescriptions  Medication Sig Dispense Refill  . acetaminophen (TYLENOL) 500 MG tablet Take  1,000 mg by mouth every 6 (six) hours as needed for mild pain or moderate pain.    [name] aspirin 81 MG tablet Take 81 mg by mouth daily.    . hydrocortisone-pramoxine (ANALPRAM HC) 2.5-1 % rectal cream Apply the cream rectally twice daily for 14 days. 30 g 2  . lisinopril-hydrochlorothiazide (PRINZIDE,ZESTORETIC) 20-12.5 MG per tablet TAKE 1 TABLET BY MOUTH DAILY. 30 tablet 0   No current facility-administered medications for this visit.    Allergies:   Benicar and Nexium    Social History:  The patient  reports that he has never smoked. He has never used smokeless tobacco. He reports that he drinks about 8.4  oz of alcohol per week. He reports that he does not use illicit drugs.   Family History:  The patient's family history includes Cancer (age of onset: 66) in his sister; Colon cancer in his maternal uncle; Diabetes in his maternal uncle; Heart attack (age of onset: 73) in his father; Heart disease in his father; Hypertension in his father, mother, and sister; Lung cancer in his father; Migraines in his mother and sister; Ovarian cancer in his mother; Skin cancer in his father; Stroke in his mother.    ROS:  Please see the history of present illness.   Otherwise, review of systems are positive for rash.   All other systems are reviewed and negative.    PHYSICAL EXAM: VS:  BP 122/84 mmHg  Pulse 76  Ht 5\' 9"  (1.753 m)  Wt 99.428 kg (219 lb 3.2 oz)  BMI 32.36 kg/m2 , BMI Body mass index is 32.36 kg/(m^2). GENERAL:  Well appearing HEENT:  Pupils equal round and reactive, fundi not visualized, oral mucosa unremarkable NECK:  No jugular venous distention, waveform within normal limits, carotid upstroke brisk and symmetric, no bruits, no thyromegaly LYMPHATICS:  No cervical adenopathy LUNGS:  Clear to auscultation bilaterally HEART:  RRR.  PMI not displaced or sustained,S1 and S2 within normal limits, no S3, no S4, no clicks, no rubs, no murmurs ABD:  Flat, positive bowel sounds normal in frequency in pitch, no bruits, no rebound, no guarding, no midline pulsatile mass, no hepatomegaly, no splenomegaly EXT:  2 plus pulses throughout, no edema, no cyanosis no clubbing SKIN:  No rashes no nodules NEURO:  Cranial nerves II through XII grossly intact, motor grossly intact throughout PSYCH:  Cognitively intact, oriented to person place and time    EKG:  EKG is ordered today. The ekg ordered today demonstrates Sinus rhythm rate 76 bpm.   Recent Labs: 01/19/2015: BUN 9; Creatinine, Ser 1.03; Potassium 4.4; Sodium 136 06/08/2015: Hemoglobin 15.4; Platelets 308    Lipid Panel    Component Value  Date/Time   CHOL 225* 06/20/2014 1026   TRIG 149 06/20/2014 1026   HDL 46 06/20/2014 1026   CHOLHDL 4.9 06/20/2014 1026   VLDL 30 06/20/2014 1026   LDLCALC 149* 06/20/2014 1026      Wt Readings from Last 3 Encounters:  07/06/15 99.428 kg (219 lb 3.2 oz)  06/20/15 98.544 kg (217 lb 4 oz)  06/08/15 99.791 kg (220 lb)      ASSESSMENT AND PLAN:  # Atypical chest pain: Mr Moss's symptoms are atypical and he has had negative stress testing and cardiac catheterization several years ago. However he does have several risk factors, including elevated lipids and family history of premature coronary artery disease.Given that it has been several years, we will pursue exercise Cardiolite stress testing to evaluate for ischemia.  His  thyroid function was normal when checked last year, and he was having these symptoms at the time. His testosterone levels have been low and I wonder if this could be contributing to his symptoms, especially the diaphoresis. If the above testing is normal, I have recommended that he follow-up with either his oncologist for an endocrinologist to pursue whether Hormonal abnormalities may be contributing to his symptoms.  # Shortness of breath: Scott Moss has both exertional and rest shortness of breath. He reported lower extremity edema, though this has improved with his diuretic. We will obtain an echocardiogram to evaluate for heart failure. He appears to be euvolemic on exam today.  # Hypertension: blood pressure well-controlled on hydrochlorothiazide and lisinopril. No changes are recommended at this time.  # Hyperlipidemia:  Scott Moss's fasting lipid panel last year was abnormal. His total cholesterol was 225 with an LDL of 149. We discussed the importance of regular exercise and dietary modifications.This is especially important given his family history. His ASCVD 10 year risk is 5.3%.  We will not start any aspirin or statin at this time, though this will need to be  reevaluated on an annual basis.   Current medicines are reviewed at length with the patient today.  The patient does not have concerns regarding medicines.  The following changes have been made:  no change  Labs/ tests ordered today include:   Orders Placed This Encounter  Procedures  . Myocardial Perfusion Imaging  . EKG 12-Lead  . ECHOCARDIOGRAM COMPLETE     Disposition:   FU with Scott Brumbaugh C. Oval Linsey, MD, [facility] in 3 months.    Signed, Scott Culliver C. Oval Linsey, MD, [facility]  07/06/2015 10:22 PM    [facility]

## 2015-07-06 ENCOUNTER — Encounter: Payer: Self-pay | Admitting: Cardiovascular Disease

## 2015-07-06 ENCOUNTER — Ambulatory Visit (INDEPENDENT_AMBULATORY_CARE_PROVIDER_SITE_OTHER): Payer: BLUE CROSS/BLUE SHIELD | Admitting: Cardiovascular Disease

## 2015-07-06 VITALS — BP 122/84 | HR 76 | Ht 69.0 in | Wt 219.2 lb

## 2015-07-06 DIAGNOSIS — R079 Chest pain, unspecified: Secondary | ICD-10-CM

## 2015-07-06 DIAGNOSIS — R0789 Other chest pain: Secondary | ICD-10-CM

## 2015-07-06 DIAGNOSIS — E785 Hyperlipidemia, unspecified: Secondary | ICD-10-CM | POA: Diagnosis not present

## 2015-07-06 DIAGNOSIS — R0602 Shortness of breath: Secondary | ICD-10-CM | POA: Diagnosis not present

## 2015-07-06 DIAGNOSIS — I10 Essential (primary) hypertension: Secondary | ICD-10-CM

## 2015-07-06 HISTORY — DX: Other chest pain: R07.89

## 2015-07-06 NOTE — Patient Instructions (Signed)
Medication Instructions:  Your physician recommends that you continue on your current medications as directed. Please refer to the Current Medication list given to you today.  Labwork: NONE  Testing/Procedures: Your physician has requested that you have an echocardiogram. Echocardiography is a painless test that uses sound waves to create images of your heart. It provides your doctor with information about the size and shape of your heart and how well your heart's chambers and valves are working. This procedure takes approximately one hour. There are no restrictions for this procedure.  Your physician has requested that you have en exercise stress myoview. For further information please visit [email or web address]. Please follow instruction sheet, as given.  Follow-Up: Your physician recommends that you schedule a follow-up appointment in: [place]  If you need a refill on your cardiac medications before your next appointment, please call your pharmacy.

## 2015-07-12 ENCOUNTER — Telehealth (HOSPITAL_COMMUNITY): Payer: Self-pay

## 2015-07-12 NOTE — Telephone Encounter (Signed)
Encounter complete. 

## 2015-07-14 ENCOUNTER — Ambulatory Visit (HOSPITAL_COMMUNITY)
Admission: RE | Admit: 2015-07-14 | Discharge: 2015-07-14 | Disposition: A | Discharge status: Home / Self Care | Payer: BLUE CROSS/BLUE SHIELD | Source: Ambulatory Visit | Attending: Cardiology | Admitting: Cardiology

## 2015-07-14 DIAGNOSIS — R61 Generalized hyperhidrosis: Secondary | ICD-10-CM | POA: Insufficient documentation

## 2015-07-14 DIAGNOSIS — R0609 Other forms of dyspnea: Secondary | ICD-10-CM | POA: Diagnosis not present

## 2015-07-14 DIAGNOSIS — R079 Chest pain, unspecified: Secondary | ICD-10-CM | POA: Insufficient documentation

## 2015-07-14 DIAGNOSIS — R0602 Shortness of breath: Secondary | ICD-10-CM

## 2015-07-14 DIAGNOSIS — I10 Essential (primary) hypertension: Secondary | ICD-10-CM | POA: Diagnosis not present

## 2015-07-14 DIAGNOSIS — Z9289 Personal history of other medical treatment: Secondary | ICD-10-CM

## 2015-07-14 DIAGNOSIS — R5383 Other fatigue: Secondary | ICD-10-CM | POA: Insufficient documentation

## 2015-07-14 DIAGNOSIS — Z8249 Family history of ischemic heart disease and other diseases of the circulatory system: Secondary | ICD-10-CM | POA: Diagnosis not present

## 2015-07-14 HISTORY — DX: Personal history of other medical treatment: Z92.89

## 2015-07-14 LAB — MYOCARDIAL PERFUSION IMAGING
CHL CUP MPHR: 179 {beats}/min
CHL CUP NUCLEAR SDS: 4
CHL CUP NUCLEAR SSS: 5
CSEPHR: 89 %
Estimated workload: 11.7 METS
Exercise duration (min): 10 min
LV dias vol: 92 mL
LV sys vol: 45 mL
Peak HR: 160 {beats}/min
RPE: 16
Rest HR: 88 {beats}/min
SRS: 1
TID: 0.95

## 2015-07-14 MED ORDER — TECHNETIUM TC 99M SESTAMIBI GENERIC - CARDIOLITE
31.2000 | Freq: Once | INTRAVENOUS | Status: AC | PRN
  Administered 2015-07-14: 31.2 via INTRAVENOUS

## 2015-07-14 MED ORDER — TECHNETIUM TC 99M SESTAMIBI GENERIC - CARDIOLITE
10.4000 | Freq: Once | INTRAVENOUS | Status: AC | PRN
  Administered 2015-07-14: 10 via INTRAVENOUS

## 2015-07-17 ENCOUNTER — Telehealth: Payer: Self-pay | Admitting: *Deleted

## 2015-07-17 NOTE — Telephone Encounter (Signed)
Spoke to patient. Result given . Verbalized understanding  

## 2015-07-17 NOTE — Telephone Encounter (Signed)
-----   Message from Skeet Latch, MD sent at 07/15/2015  4:51 PM EST ----- Normal stress test.

## 2015-07-18 ENCOUNTER — Ambulatory Visit (HOSPITAL_COMMUNITY): Payer: BLUE CROSS/BLUE SHIELD | Attending: Cardiology

## 2015-07-18 ENCOUNTER — Other Ambulatory Visit: Payer: Self-pay

## 2015-07-18 DIAGNOSIS — R0602 Shortness of breath: Secondary | ICD-10-CM | POA: Diagnosis not present

## 2015-07-18 DIAGNOSIS — I517 Cardiomegaly: Secondary | ICD-10-CM | POA: Diagnosis not present

## 2015-07-18 DIAGNOSIS — R079 Chest pain, unspecified: Secondary | ICD-10-CM | POA: Diagnosis present

## 2015-07-18 DIAGNOSIS — Z9289 Personal history of other medical treatment: Secondary | ICD-10-CM

## 2015-07-18 HISTORY — DX: Personal history of other medical treatment: Z92.89

## 2015-07-26 ENCOUNTER — Telehealth: Payer: Self-pay | Admitting: *Deleted

## 2015-07-26 NOTE — Telephone Encounter (Signed)
Spoke to patient.  ECHO Result given . Verbalized understanding  

## 2015-07-26 NOTE — Telephone Encounter (Signed)
-----   Message from Skeet Latch, MD sent at 07/25/2015 12:33 AM EST ----- Normal echo

## 2015-10-04 ENCOUNTER — Ambulatory Visit: Payer: BLUE CROSS/BLUE SHIELD | Admitting: Cardiovascular Disease

## 2015-10-13 ENCOUNTER — Other Ambulatory Visit: Payer: Self-pay | Admitting: Physician Assistant

## 2015-10-13 NOTE — H&P (Signed)
  Scott Moss comes in for follow up for his left knee.  He is miserable.  He is catching, locking and giving way.  Injection we did previously helped to an extent for about four weeks.  Never completely relieving pain.  Swelling never resolved.  Wakes him up at night.  Anything that challenges the patellofemoral joint causes marked buckling and catching.  All of this resulting from an abrupt injury coming off an embankment in December of 2016.  He has never gotten better since then.  He has had previous arthroscopy, debridement of Grade III changes patellofemoral joint with arthroscopy and lateral retinacular release by me back in 2014.  He did great until this recent event.  MRI scan shows the chondral changes, but no new meniscal injury.  He is miserable and would like to do something definitive.   History, workup and treatment to date reviewed.  I went over his MRI scan and x-rays.    EXAMINATION: General exam is reviewed.  Specifically, 40 years old.  Height: 5?9.  Weight: 215 pounds.  lungs clear to auscultation bilaterally.  Heart sounds normal.  Markedly antalgic gait on the left.  He has profound patellofemoral crepitus on the left.  He does not have recurrent tethering.  Still has some lateral tracking.  Significant chondromalacia.  1-2+ reactive effusion.  Ligaments stable.  No meniscal signs.  Neurovascularly intact distally.    DISPOSITION:  I have talked at length with Alec.  He is getting worse rather than better.  We know what is there and I know he has exacerbated his underlying chondromalacia.  He is getting more giving way and mechanical symptoms.  We discussed definitive treatment.  Exam under anesthesia, arthroscopy, chondroplasty.  We will assess his changes.  I am hoping that re-contouring his patella with chondroplasty and removing debris is going to help enough.  At the end of the day if I see more profound changes this could come down to patellofemoral replacement.  I certainly think  arthroscopy is the next step.  Both he and I agree he is miserable enough to do this.  Procedure, risks, benefits and complications reviewed.  Paperwork complete.  All questions answered.    [name], M.D.

## 2015-10-16 ENCOUNTER — Encounter (HOSPITAL_BASED_OUTPATIENT_CLINIC_OR_DEPARTMENT_OTHER)
Admission: RE | Admit: 2015-10-16 | Discharge: 2015-10-16 | Disposition: A | Discharge status: Home / Self Care | Payer: BLUE CROSS/BLUE SHIELD | Source: Ambulatory Visit | Attending: Orthopedic Surgery | Admitting: Orthopedic Surgery

## 2015-10-16 ENCOUNTER — Encounter (HOSPITAL_BASED_OUTPATIENT_CLINIC_OR_DEPARTMENT_OTHER): Payer: Self-pay | Admitting: *Deleted

## 2015-10-16 DIAGNOSIS — Z79899 Other long term (current) drug therapy: Secondary | ICD-10-CM | POA: Diagnosis not present

## 2015-10-16 DIAGNOSIS — Z7982 Long term (current) use of aspirin: Secondary | ICD-10-CM | POA: Diagnosis not present

## 2015-10-16 DIAGNOSIS — M2240 Chondromalacia patellae, unspecified knee: Secondary | ICD-10-CM | POA: Diagnosis present

## 2015-10-16 DIAGNOSIS — I10 Essential (primary) hypertension: Secondary | ICD-10-CM | POA: Diagnosis not present

## 2015-10-16 LAB — BASIC METABOLIC PANEL
ANION GAP: 11 (ref 5–15)
BUN: 16 mg/dL (ref 6–20)
CO2: 23 mmol/L (ref 22–32)
Calcium: 9.5 mg/dL (ref 8.9–10.3)
Chloride: 104 mmol/L (ref 101–111)
Creatinine, Ser: 1.07 mg/dL (ref 0.61–1.24)
GFR calc Af Amer: >60 mL/min (ref >60)
GLUCOSE: 94 mg/dL (ref 65–99)
POTASSIUM: 4 mmol/L (ref 3.5–5.1)
Sodium: 138 mmol/L (ref 135–145)

## 2015-10-18 ENCOUNTER — Encounter (HOSPITAL_BASED_OUTPATIENT_CLINIC_OR_DEPARTMENT_OTHER): Payer: Self-pay | Admitting: *Deleted

## 2015-10-19 ENCOUNTER — Ambulatory Visit (HOSPITAL_BASED_OUTPATIENT_CLINIC_OR_DEPARTMENT_OTHER): Payer: BLUE CROSS/BLUE SHIELD | Admitting: Anesthesiology

## 2015-10-19 ENCOUNTER — Encounter (HOSPITAL_BASED_OUTPATIENT_CLINIC_OR_DEPARTMENT_OTHER)
Admission: RE | Disposition: A | Discharge status: Home / Self Care | Payer: Self-pay | Source: Ambulatory Visit | Attending: Orthopedic Surgery

## 2015-10-19 ENCOUNTER — Encounter (HOSPITAL_BASED_OUTPATIENT_CLINIC_OR_DEPARTMENT_OTHER): Payer: Self-pay

## 2015-10-19 ENCOUNTER — Ambulatory Visit (HOSPITAL_BASED_OUTPATIENT_CLINIC_OR_DEPARTMENT_OTHER)
Admission: RE | Admit: 2015-10-19 | Discharge: 2015-10-19 | Disposition: A | Discharge status: Home / Self Care | Payer: BLUE CROSS/BLUE SHIELD | Source: Ambulatory Visit | Attending: Orthopedic Surgery | Admitting: Orthopedic Surgery

## 2015-10-19 DIAGNOSIS — Z7982 Long term (current) use of aspirin: Secondary | ICD-10-CM | POA: Insufficient documentation

## 2015-10-19 DIAGNOSIS — M2240 Chondromalacia patellae, unspecified knee: Secondary | ICD-10-CM | POA: Insufficient documentation

## 2015-10-19 DIAGNOSIS — Z79899 Other long term (current) drug therapy: Secondary | ICD-10-CM | POA: Insufficient documentation

## 2015-10-19 DIAGNOSIS — I10 Essential (primary) hypertension: Secondary | ICD-10-CM | POA: Insufficient documentation

## 2015-10-19 HISTORY — PX: KNEE ARTHROSCOPY: SHX127

## 2015-10-19 HISTORY — DX: Other complications of anesthesia, initial encounter: T88.59XA

## 2015-10-19 HISTORY — DX: Adverse effect of unspecified anesthetic, initial encounter: T41.45XA

## 2015-10-19 HISTORY — DX: Nausea with vomiting, unspecified: R11.2

## 2015-10-19 HISTORY — DX: Nausea with vomiting, unspecified: Z98.890

## 2015-10-19 SURGERY — ARTHROSCOPY, KNEE
Anesthesia: General | Site: Knee | Laterality: Left

## 2015-10-19 MED ORDER — DEXAMETHASONE SODIUM PHOSPHATE 4 MG/ML IJ SOLN
INTRAMUSCULAR | Status: DC | PRN
  Administered 2015-10-19: 10 mg via INTRAVENOUS

## 2015-10-19 MED ORDER — SCOPOLAMINE 1 MG/3DAYS TD PT72
1.0000 | MEDICATED_PATCH | Freq: Once | TRANSDERMAL | Status: DC | PRN
  Administered 2015-10-19: 1.5 mg via TRANSDERMAL

## 2015-10-19 MED ORDER — DEXAMETHASONE SODIUM PHOSPHATE 10 MG/ML IJ SOLN
INTRAMUSCULAR | Status: AC
  Filled 2015-10-19: qty 2

## 2015-10-19 MED ORDER — KETOROLAC TROMETHAMINE 30 MG/ML IJ SOLN
INTRAMUSCULAR | Status: DC | PRN
  Administered 2015-10-19: 30 mg via INTRAVENOUS

## 2015-10-19 MED ORDER — FENTANYL CITRATE (PF) 100 MCG/2ML IJ SOLN
INTRAMUSCULAR | Status: AC
  Filled 2015-10-19: qty 2

## 2015-10-19 MED ORDER — PROPOFOL 10 MG/ML IV BOLUS
INTRAVENOUS | Status: AC
  Filled 2015-10-19: qty 20

## 2015-10-19 MED ORDER — CEFAZOLIN SODIUM-DEXTROSE 2-3 GM-% IV SOLR
INTRAVENOUS | Status: AC
  Filled 2015-10-19: qty 50

## 2015-10-19 MED ORDER — ONDANSETRON HCL 4 MG PO TABS: 4.0000 mg | ORAL_TABLET | Freq: Three times a day (TID) | ORAL | Status: DC | PRN

## 2015-10-19 MED ORDER — CHLORHEXIDINE GLUCONATE 4 % EX LIQD: 60.0000 mL | Freq: Once | CUTANEOUS | Status: DC

## 2015-10-19 MED ORDER — ONDANSETRON HCL 4 MG/2ML IJ SOLN
INTRAMUSCULAR | Status: DC | PRN
  Administered 2015-10-19: 4 mg via INTRAVENOUS

## 2015-10-19 MED ORDER — KETOROLAC TROMETHAMINE 30 MG/ML IJ SOLN
INTRAMUSCULAR | Status: AC
  Filled 2015-10-19: qty 1

## 2015-10-19 MED ORDER — OXYCODONE-ACETAMINOPHEN 5-325 MG PO TABS
ORAL_TABLET | ORAL | Status: AC
  Filled 2015-10-19: qty 1

## 2015-10-19 MED ORDER — CEFAZOLIN SODIUM-DEXTROSE 2-3 GM-% IV SOLR
2.0000 g | INTRAVENOUS | Status: AC
  Administered 2015-10-19: 2 g via INTRAVENOUS

## 2015-10-19 MED ORDER — PROPOFOL 10 MG/ML IV BOLUS
INTRAVENOUS | Status: DC | PRN
  Administered 2015-10-19: 200 mg via INTRAVENOUS

## 2015-10-19 MED ORDER — ONDANSETRON HCL 4 MG/2ML IJ SOLN
INTRAMUSCULAR | Status: AC
  Filled 2015-10-19: qty 2

## 2015-10-19 MED ORDER — OXYCODONE-ACETAMINOPHEN 5-325 MG PO TABS: 1.0000 | ORAL_TABLET | ORAL | Status: DC | PRN

## 2015-10-19 MED ORDER — BUPIVACAINE HCL (PF) 0.5 % IJ SOLN
INTRAMUSCULAR | Status: DC | PRN
  Administered 2015-10-19: 20 mL

## 2015-10-19 MED ORDER — METHYLPREDNISOLONE ACETATE 80 MG/ML IJ SUSP
INTRAMUSCULAR | Status: DC | PRN
  Administered 2015-10-19: 1 mL via INTRA_ARTICULAR

## 2015-10-19 MED ORDER — SODIUM CHLORIDE 0.9 % IR SOLN
Status: DC | PRN
  Administered 2015-10-19: 1

## 2015-10-19 MED ORDER — SUCCINYLCHOLINE CHLORIDE 20 MG/ML IJ SOLN
INTRAMUSCULAR | Status: DC | PRN
  Administered 2015-10-19: 120 mg via INTRAVENOUS

## 2015-10-19 MED ORDER — LIDOCAINE HCL (CARDIAC) 20 MG/ML IV SOLN
INTRAVENOUS | Status: DC | PRN
  Administered 2015-10-19: 80 mg via INTRAVENOUS

## 2015-10-19 MED ORDER — OXYCODONE-ACETAMINOPHEN 5-325 MG PO TABS
1.0000 | ORAL_TABLET | Freq: Once | ORAL | Status: AC
  Administered 2015-10-19: 1 via ORAL

## 2015-10-19 MED ORDER — MIDAZOLAM HCL 2 MG/2ML IJ SOLN
INTRAMUSCULAR | Status: AC
  Filled 2015-10-19: qty 2

## 2015-10-19 MED ORDER — FENTANYL CITRATE (PF) 100 MCG/2ML IJ SOLN
50.0000 ug | INTRAMUSCULAR | Status: DC | PRN
  Administered 2015-10-19: 100 ug via INTRAVENOUS

## 2015-10-19 MED ORDER — OXYCODONE HCL 5 MG PO TABS
ORAL_TABLET | ORAL | Status: AC
  Filled 2015-10-19: qty 1

## 2015-10-19 MED ORDER — LIDOCAINE HCL (CARDIAC) 20 MG/ML IV SOLN
INTRAVENOUS | Status: AC
  Filled 2015-10-19: qty 5

## 2015-10-19 MED ORDER — MIDAZOLAM HCL 2 MG/2ML IJ SOLN
1.0000 mg | INTRAMUSCULAR | Status: DC | PRN
Start: 2015-10-19 — End: 2015-10-19
  Administered 2015-10-19: 2 mg via INTRAVENOUS

## 2015-10-19 MED ORDER — LACTATED RINGERS IV SOLN
INTRAVENOUS | Status: DC
  Administered 2015-10-19: 12:00:00 via INTRAVENOUS

## 2015-10-19 MED ORDER — GLYCOPYRROLATE 0.2 MG/ML IJ SOLN: 0.2000 mg | Freq: Once | INTRAMUSCULAR | Status: DC | PRN

## 2015-10-19 MED ORDER — LACTATED RINGERS IV SOLN: INTRAVENOUS | Status: DC

## 2015-10-19 MED ORDER — SCOPOLAMINE 1 MG/3DAYS TD PT72
MEDICATED_PATCH | TRANSDERMAL | Status: AC
Start: 2015-10-19 — End: 2015-10-19
  Filled 2015-10-19: qty 1

## 2015-10-19 MED ORDER — METHYLPREDNISOLONE ACETATE 80 MG/ML IJ SUSP
INTRAMUSCULAR | Status: AC
  Filled 2015-10-19: qty 1

## 2015-10-19 SURGICAL SUPPLY — 40 items
BANDAGE ACE 6X5 VEL STRL LF (GAUZE/BANDAGES/DRESSINGS) ×2 IMPLANT
BLADE CUDA 5.5 (BLADE) IMPLANT
BLADE CUDA GRT WHITE 3.5 (BLADE) IMPLANT
BLADE CUTTER GATOR 3.5 (BLADE) ×2 IMPLANT
BLADE CUTTER MENIS 5.5 (BLADE) IMPLANT
BLADE GREAT WHITE 4.2 (BLADE) ×2 IMPLANT
BUR OVAL 4.0 (BURR) IMPLANT
CUTTER MENISCUS  4.2MM (BLADE)
CUTTER MENISCUS 4.2MM (BLADE) IMPLANT
DRAPE ARTHROSCOPY W/POUCH 90 (DRAPES) ×2 IMPLANT
DRSG XEROFORM 1X8 (GAUZE/BANDAGES/DRESSINGS) ×1 IMPLANT
DURAPREP 26ML APPLICATOR (WOUND CARE) ×2 IMPLANT
ELECT MENISCUS 165MM 90D (ELECTRODE) IMPLANT
ELECT REM PT RETURN 9FT ADLT (ELECTROSURGICAL)
ELECTRODE REM PT RTRN 9FT ADLT (ELECTROSURGICAL) IMPLANT
GAUZE SPONGE 4X4 12PLY STRL (GAUZE/BANDAGES/DRESSINGS) ×2 IMPLANT
GAUZE XEROFORM 1X8 LF (GAUZE/BANDAGES/DRESSINGS) ×2 IMPLANT
GLOVE BIOGEL PI IND STRL 7.0 (GLOVE) ×1 IMPLANT
GLOVE BIOGEL PI INDICATOR 7.0 (GLOVE) ×1
GLOVE ECLIPSE 7.0 STRL STRAW (GLOVE) ×2 IMPLANT
GLOVE SURG ORTHO 8.0 STRL STRW (GLOVE) ×2 IMPLANT
GOWN STRL REUS W/ TWL LRG LVL3 (GOWN DISPOSABLE) ×2 IMPLANT
GOWN STRL REUS W/ TWL XL LVL3 (GOWN DISPOSABLE) ×1 IMPLANT
GOWN STRL REUS W/TWL LRG LVL3 (GOWN DISPOSABLE) ×4
GOWN STRL REUS W/TWL XL LVL3 (GOWN DISPOSABLE) ×4 IMPLANT
HOLDER KNEE FOAM BLUE (MISCELLANEOUS) ×2 IMPLANT
IV NS IRRIG 3000ML ARTHROMATIC (IV SOLUTION) ×4 IMPLANT
KNEE WRAP E Z 3 GEL PACK (MISCELLANEOUS) ×2 IMPLANT
MANIFOLD NEPTUNE II (INSTRUMENTS) ×2 IMPLANT
PACK ARTHROSCOPY DSU (CUSTOM PROCEDURE TRAY) ×2 IMPLANT
PACK BASIN DAY SURGERY FS (CUSTOM PROCEDURE TRAY) ×2 IMPLANT
PADDING WEBRIL 4 STERILE (GAUZE/BANDAGES/DRESSINGS) ×1 IMPLANT
PENCIL BUTTON HOLSTER BLD 10FT (ELECTRODE) IMPLANT
SET ARTHROSCOPY TUBING (MISCELLANEOUS) ×2
SET ARTHROSCOPY TUBING LN (MISCELLANEOUS) ×1 IMPLANT
SPONGE GAUZE 4X4 12PLY (GAUZE/BANDAGES/DRESSINGS) ×1 IMPLANT
SUT ETHILON 3 0 PS 1 (SUTURE) ×2 IMPLANT
SUT VIC AB 3-0 FS2 27 (SUTURE) IMPLANT
TOWEL OR 17X24 6PK STRL BLUE (TOWEL DISPOSABLE) ×2 IMPLANT
WATER STERILE IRR 1000ML POUR (IV SOLUTION) ×2 IMPLANT

## 2015-10-19 NOTE — Transfer of Care (Signed)
Immediate Anesthesia Transfer of Care Note  Patient: Scott Moss  Procedure(s) Performed: Procedure(s): LEFT KNEE SCOPE CHONDROPLASTY (Left)  Patient Location: PACU  Anesthesia Type:General  Level of Consciousness: awake and sedated  Airway & Oxygen Therapy: Patient Spontanous Breathing and Patient connected to face mask oxygen  Post-op Assessment: Report given to RN and Post -op Vital signs reviewed and stable  Post vital signs: Reviewed and stable  Last Vitals:  Filed Vitals:   10/19/15 1045  BP: 127/81  Pulse: 81  Temp: 36.7 C  Resp: 20    Complications: No apparent anesthesia complications

## 2015-10-19 NOTE — Anesthesia Postprocedure Evaluation (Signed)
Anesthesia Post Note  Patient: Scott Moss  Procedure(s) Performed: Procedure(s) (LRB): LEFT KNEE SCOPE CHONDROPLASTY (Left)  Patient location during evaluation: PACU Anesthesia Type: General Level of consciousness: awake and alert and patient cooperative Pain management: pain level controlled Vital Signs Assessment: post-procedure vital signs reviewed and stable Respiratory status: spontaneous breathing and respiratory function stable Cardiovascular status: stable Anesthetic complications: no    Last Vitals:  Filed Vitals:   10/19/15 1345 10/19/15 1423  BP: 124/75 137/95  Pulse: 77 89  Temp:  36.6 C  Resp: 14 16    Last Pain:  Filed Vitals:   10/19/15 1424  PainSc: 3     LLE Motor Response: Purposeful movement (10/19/15 1415) LLE Sensation: Full sensation (10/19/15 1415)          [name] S

## 2015-10-19 NOTE — Anesthesia Preprocedure Evaluation (Signed)
Anesthesia Evaluation  Patient identified by MRN, date of birth, ID band Patient awake    Reviewed: Allergy & Precautions, H&P , NPO status , Patient's Chart, lab work & pertinent test results  Airway Mallampati: II  TM Distance: >3 FB Neck ROM: Full    Dental no notable dental hx. (+) Teeth Intact, Dental Advisory Given   Pulmonary neg pulmonary ROS,    Pulmonary exam normal breath sounds clear to auscultation       Cardiovascular hypertension, negative cardio ROS   Rhythm:Regular Rate:Normal     Neuro/Psych negative neurological ROS  negative psych ROS   GI/Hepatic negative GI ROS, Neg liver ROS,   Endo/Other  negative endocrine ROS  Renal/GU negative Renal ROS  negative genitourinary   Musculoskeletal   Abdominal   Peds  Hematology negative hematology ROS (+)   Anesthesia Other Findings Hx of HTN; No Meds LMA #5 last time without difficulty  Reproductive/Obstetrics negative OB ROS                             Anesthesia Physical  Anesthesia Plan  ASA: II  Anesthesia Plan: General   Post-op Pain Management:    Induction: Intravenous  Airway Management Planned: LMA  Additional Equipment:   Intra-op Plan:   Post-operative Plan: Extubation in OR  Informed Consent: I have reviewed the patients History and Physical, chart, labs and discussed the procedure including the risks, benefits and alternatives for the proposed anesthesia with the patient or authorized representative who has indicated his/her understanding and acceptance.   Dental advisory given  Plan Discussed with: CRNA  Anesthesia Plan Comments:         Anesthesia Quick Evaluation

## 2015-10-19 NOTE — Anesthesia Procedure Notes (Signed)
Procedure Name: Intubation Performed by: Jarrick Fjeld W Pre-anesthesia Checklist: Patient identified, Emergency Drugs available, Suction available and Patient being monitored Patient Re-evaluated:Patient Re-evaluated prior to inductionOxygen Delivery Method: Circle System Utilized Preoxygenation: Pre-oxygenation with 100% oxygen Intubation Type: IV induction Ventilation: Mask ventilation without difficulty Laryngoscope Size: Miller and 2 Grade View: Grade II Tube type: Oral Tube size: 7.0 mm Number of attempts: 1 Airway Equipment and Method: Stylet and Oral airway Placement Confirmation: ETT inserted through vocal cords under direct vision,  positive ETCO2 and breath sounds checked- equal and bilateral Secured at: 22 cm Tube secured with: Tape Dental Injury: Teeth and Oropharynx as per pre-operative assessment      

## 2015-10-19 NOTE — Interval H&P Note (Signed)
History and Physical Interval Note:  10/19/2015 8:49 AM  Scott Moss  has presented today for surgery, with the diagnosis of CHONDROMALACIA PATELLAE LEFT KNEE  The various methods of treatment have been discussed with the patient and family. After consideration of risks, benefits and other options for treatment, the patient has consented to  Procedure(s): LEFT KNEE SCOPE CHONDROPLASTY (Left) as a surgical intervention .  The patient's history has been reviewed, patient examined, no change in status, stable for surgery.  I have reviewed the patient's chart and labs.  Questions were answered to the patient's satisfaction.     [name]

## 2015-10-19 NOTE — Discharge Instructions (Signed)

## 2015-10-20 ENCOUNTER — Encounter (HOSPITAL_BASED_OUTPATIENT_CLINIC_OR_DEPARTMENT_OTHER): Payer: Self-pay | Admitting: Orthopedic Surgery

## 2015-10-20 NOTE — Op Note (Signed)
NAMESAKARI, WOFFORD                 ACCOUNT NO.:  [id number]  MEDICAL RECORD NO.:  [id number]  LOCATION:                                 FACILITY:  PHYSICIAN:  [name], M.D. DATE OF BIRTH:  April 23, 1976  DATE OF PROCEDURE:  10/19/2015 DATE OF DISCHARGE:                              OPERATIVE REPORT   PREOPERATIVE DIAGNOSIS:  Progressive worsening chondromalacia patella. Previous arthroscopy with chondroplasty and lateral release.  POSTOPERATIVE DIAGNOSIS:  Progressive worsening chondromalacia patella. Previous arthroscopy with chondroplasty and lateral release with still acceptable tracking.  PROCEDURE:  Left knee exam under anesthesia, arthroscopy.  Chondroplasty patella subsequent to tracking.  SURGEON:  [name], MD  ASSISTANT:  Elmyra Ricks, PA  ANESTHESIA:  General.  BLOOD LOSS:  Minimal.  SPECIMENS:  None.  CULTURES:  None.  COMPLICATIONS:  None.  DRESSINGS:  Soft compressive.  TOURNIQUET:  Not employed.  PROCEDURE:  The patient was brought to operating room, placed on the operating table in supine position.  Leg holder applied.  Leg prepped and draped in usual sterile fashion.  Two portals, one each medial and lateral parapatellar.  Arthroscope introduced.  Knee distended and inspected.  Areas of grade 3 worsening chondromalacia medial and lateral patella.  Trochlea did not look bad.  Still very acceptable lateral release without any evidence of recurrent tethering.  A little bit of adhesions laterally debrided.  Medial meniscus and medial compartment. Lateral meniscus, lateral compartment.  Cruciate ligaments all normal. Chondroplasty to a stable surface throughout the patella.  All chondral loose bodies removed.  Portals were closed with nylon.  Knee injected with Depo-Medrol and Marcaine.  Sterile compressive dressing applied.  Anesthesia reversed.  Brought to recovery room.  Tolerated the surgery well.  No  complications.     [name], M.D.     DFM/MEDQ  D:  10/19/2015  T:  10/19/2015  Job:  [id number]

## 2015-11-29 ENCOUNTER — Other Ambulatory Visit: Payer: Self-pay | Admitting: Medical

## 2016-02-28 ENCOUNTER — Other Ambulatory Visit: Payer: Self-pay | Admitting: Medical

## 2016-04-03 ENCOUNTER — Encounter: Payer: Self-pay | Admitting: Medical

## 2016-04-03 ENCOUNTER — Ambulatory Visit (INDEPENDENT_AMBULATORY_CARE_PROVIDER_SITE_OTHER): Payer: BLUE CROSS/BLUE SHIELD | Admitting: Medical

## 2016-04-03 VITALS — BP 128/80 | HR 96 | Ht 69.25 in | Wt 220.0 lb

## 2016-04-03 DIAGNOSIS — K602 Anal fissure, unspecified: Secondary | ICD-10-CM

## 2016-04-03 DIAGNOSIS — H7392 Unspecified disorder of tympanic membrane, left ear: Secondary | ICD-10-CM | POA: Insufficient documentation

## 2016-04-03 DIAGNOSIS — E669 Obesity, unspecified: Secondary | ICD-10-CM | POA: Diagnosis not present

## 2016-04-03 DIAGNOSIS — Z8547 Personal history of malignant neoplasm of testis: Secondary | ICD-10-CM | POA: Diagnosis not present

## 2016-04-03 DIAGNOSIS — Z8249 Family history of ischemic heart disease and other diseases of the circulatory system: Secondary | ICD-10-CM

## 2016-04-03 DIAGNOSIS — L74519 Primary focal hyperhidrosis, unspecified: Secondary | ICD-10-CM

## 2016-04-03 DIAGNOSIS — I10 Essential (primary) hypertension: Secondary | ICD-10-CM | POA: Diagnosis not present

## 2016-04-03 DIAGNOSIS — Z809 Family history of malignant neoplasm, unspecified: Secondary | ICD-10-CM | POA: Diagnosis not present

## 2016-04-03 DIAGNOSIS — Z2821 Immunization not carried out because of patient refusal: Secondary | ICD-10-CM | POA: Diagnosis not present

## 2016-04-03 DIAGNOSIS — Z23 Encounter for immunization: Secondary | ICD-10-CM

## 2016-04-03 DIAGNOSIS — Z0000 Encounter for general adult medical examination without abnormal findings: Secondary | ICD-10-CM | POA: Diagnosis not present

## 2016-04-03 DIAGNOSIS — Z125 Encounter for screening for malignant neoplasm of prostate: Secondary | ICD-10-CM | POA: Diagnosis not present

## 2016-04-03 DIAGNOSIS — R61 Generalized hyperhidrosis: Secondary | ICD-10-CM | POA: Insufficient documentation

## 2016-04-03 DIAGNOSIS — R94120 Abnormal auditory function study: Secondary | ICD-10-CM

## 2016-04-03 LAB — POCT URINALYSIS DIPSTICK
BILIRUBIN UA: NEGATIVE
Glucose, UA: NEGATIVE
Ketones, UA: NEGATIVE
LEUKOCYTES UA: NEGATIVE
NITRITE UA: NEGATIVE
PH UA: 7.5
PROTEIN UA: NEGATIVE
RBC UA: NEGATIVE
Spec Grav, UA: >=1.03
UROBILINOGEN UA: NEGATIVE

## 2016-04-03 LAB — COMPREHENSIVE METABOLIC PANEL
ALBUMIN: 4.5 g/dL (ref 3.6–5.1)
ALT: 52 U/L — ABNORMAL HIGH (ref 9–46)
AST: 31 U/L (ref 10–40)
Alkaline Phosphatase: 69 U/L (ref 40–115)
BUN: 18 mg/dL (ref 7–25)
CALCIUM: 9.5 mg/dL (ref 8.6–10.3)
CHLORIDE: 103 mmol/L (ref 98–110)
CO2: 23 mmol/L (ref 20–31)
CREATININE: 1.09 mg/dL (ref 0.60–1.35)
Glucose, Bld: 93 mg/dL (ref 65–99)
POTASSIUM: 4.4 mmol/L (ref 3.5–5.3)
SODIUM: 138 mmol/L (ref 135–146)
TOTAL PROTEIN: 7.3 g/dL (ref 6.1–8.1)
Total Bilirubin: 0.6 mg/dL (ref 0.2–1.2)

## 2016-04-03 LAB — LIPID PANEL
CHOLESTEROL: 221 mg/dL — AB (ref 125–200)
HDL: 49 mg/dL (ref >=40)
LDL Cholesterol: 144 mg/dL — ABNORMAL HIGH (ref <130)
TRIGLYCERIDES: 141 mg/dL (ref <150)
Total CHOL/HDL Ratio: 4.5 Ratio (ref <=5.0)
VLDL: 28 mg/dL (ref <30)

## 2016-04-03 LAB — CBC
HCT: 47.3 % (ref 38.5–50.0)
HEMOGLOBIN: 16.4 g/dL (ref 13.2–17.1)
MCH: 31.2 pg (ref 27.0–33.0)
MCHC: 34.7 g/dL (ref 32.0–36.0)
MCV: 90.1 fL (ref 80.0–100.0)
MPV: 9.4 fL (ref 7.5–12.5)
PLATELETS: 341 10*3/uL (ref 140–400)
RBC: 5.25 MIL/uL (ref 4.20–5.80)
RDW: 13.3 % (ref 11.0–15.0)
WBC: 6.5 10*3/uL (ref 4.0–10.5)

## 2016-04-03 LAB — HEMOGLOBIN A1C
HEMOGLOBIN A1C: 5.6 % (ref <5.7)
Mean Plasma Glucose: 114 mg/dL

## 2016-04-03 LAB — PSA: PSA: 0.6 ng/mL (ref <=4.0)

## 2016-04-03 LAB — TSH: TSH: 0.88 m[IU]/L (ref 0.40–4.50)

## 2016-04-03 MED ORDER — ASPIRIN 81 MG PO TABS: 81.0000 mg | ORAL_TABLET | Freq: Every day | ORAL | 3 refills | Status: DC

## 2016-04-03 MED ORDER — LISINOPRIL-HYDROCHLOROTHIAZIDE 20-12.5 MG PO TABS: 1.0000 | ORAL_TABLET | Freq: Every day | ORAL | 3 refills | Status: DC

## 2016-04-03 NOTE — Addendum Note (Signed)
Addended by: Billie Lade on: 04/03/2016 11:21 AM   Modules accepted: Orders

## 2016-04-03 NOTE — Addendum Note (Signed)
Addended by: Billie Lade on: 04/03/2016 10:27 AM   Modules accepted: Orders

## 2016-04-03 NOTE — Progress Notes (Signed)
Subjective:   HPI  Scott Moss is a 39 y.o. male who presents for a complete physical. Chief Complaint  Patient presents with  . Annual Exam    fasting, has not taken medication this morning. was not able to give urine. no problems or concerns   Medical team: Dr. Peter Ennever, oncology, but after 10 years cancer free, was released from care Dr. Daniel Murphy, ortho Dr. Tiffany [place], cardiology Jessica Zehr, PA, gastroenterology Sees dentist, no eye doctor TYSINGER, DAVID SHANE, PA-C here for primary care  Concerns: Hyperhidrosis - Still sweats a lot, can be drenched every day at work.   Head to knees covered in sweats.     Reviewed their medical, surgical, family, social, medication, and allergy history and updated chart as appropriate.  Past Medical History:  Diagnosis Date  . Atypical chest pain 07/06/2015   Non-obstructive CAD on [facility] 04/2010.  . Chondromalacia of left knee 09/2012  . Complication of anesthesia   . Family history of cancer    multiple family members, multiple types.   Sees Dr. Ennever  . Family history of cancer   . Family history of premature CAD   . H/O cardiovascular stress test 07/14/2015   myocardial perfusion scan, normal, EF 45-54% mildy reduced LV function, Dr. [place]  . H/O echocardiogram 07/18/2015   LV mild hypertrophy, 55-60%EF, Dr. [place]  . Hypertension   . Obesity   . Overweight   . PONV (postoperative nausea and vomiting)   . Testicular cancer (HCC) 02/2005   Dr. Ennever    Past Surgical History:  Procedure Laterality Date  . ANTERIOR CERVICAL DECOMP/DISCECTOMY FUSION  11/11/2008   C5-6  . APPENDECTOMY    . CARDIAC CATHETERIZATION  04/13/2010   no disease  . CERVICAL SPINE SURGERY  2015   Dr. Henry Poole  . COLONOSCOPY     prior, no problems per patient, year unknown, possibly in early 30s.  . KNEE ARTHROSCOPY Right   . KNEE ARTHROSCOPY Left 10/19/2015   Procedure: LEFT KNEE SCOPE CHONDROPLASTY;  Surgeon: Daniel F Murphy,  MD;  Location: [facility];  Service: Orthopedics;  Laterality: Left;  . KNEE ARTHROSCOPY WITH LATERAL RELEASE Left 09/18/2012   Procedure: KNEE ARTHROSCOPY WITH LATERAL RELEASE;  Surgeon: Daniel F Murphy, MD;  Location:  SURGERY CENTER;  Service: Orthopedics;  Laterality: Left;  WITH DEBRIDEMENT AND SHAVING(CHONDROPLASTY), Excision Medial [place]  . NASAL SINUS SURGERY  2014  . RADICAL ORCHIECTOMY  02/07/2005   left; with left testicular implant    Social History   Social History  . Marital status: Married    Spouse name: N/A  . Number of children: 2  . Years of education: N/A   Occupational History  . truck driver    Social History Main Topics  . Smoking status: Never Smoker  . Smokeless tobacco: Never Used     Comment: never used tobacco  . Alcohol use 1.8 oz/week    3 Cans of beer per week  . Drug use: No  . Sexual activity: Yes   Other Topics Concern  . Not on file   Social History Narrative   Married, 2 children, ages 21yo ([employer], son studying for physical therapy), and 13yo, is a truck driver for [employer].   Exercise - not much as of 03/2016          Family History  Problem Relation Age of Onset  . Ovarian cancer Mother     uterine, mets  .   Hypertension Mother   . Stroke Mother   . Migraines Mother   . Lung cancer Father   . Hypertension Father   . Heart attack Father 82  . Skin cancer Father   . Heart disease Father   . Cancer Sister 55    multiple myeloma  . Hypertension Sister   . Migraines Sister   . Diabetes Maternal Uncle   . Colon cancer Maternal Uncle      Current Outpatient Prescriptions:  .  aspirin 81 MG tablet, Take 1 tablet (81 mg total) by mouth daily., Disp: 90 tablet, Rfl: 3 .  lisinopril-hydrochlorothiazide (PRINZIDE,ZESTORETIC) 20-12.5 MG tablet, Take 1 tablet by mouth daily., Disp: 90 tablet, Rfl: 3  Allergies  Allergen Reactions  . Benicar [Olmesartan]     Rash, allergic reaction  . Nexium [Esomeprazole  Magnesium]     Rash, allergic reaction     Review of Systems Constitutional: -fever, -chills, -sweats, -unexpected weight change, -decreased appetite, -fatigue Allergy: -sneezing, -itching, -congestion Dermatology: -changing moles, --rash, -lumps ENT: -runny nose, -ear pain, -sore throat, -hoarseness, -sinus pain, -teeth pain, - ringing in ears, -hearing loss, -nosebleeds Cardiology: -chest pain, -palpitations, -swelling, -difficulty breathing when lying flat, -waking up short of breath Respiratory: -cough, -shortness of breath, -difficulty breathing with exercise or exertion, -wheezing, -coughing up blood Gastroenterology: -abdominal pain, -nausea, -vomiting, -diarrhea, -constipation, -blood in stool, -changes in bowel movement, -difficulty swallowing or eating Hematology: -bleeding, -bruising  Musculoskeletal: -joint aches, -muscle aches, -joint swelling, -back pain, -neck pain, -cramping, -changes in gait Ophthalmology: denies vision changes, eye redness, itching, discharge Urology: -burning with urination, -difficulty urinating, -blood in urine, -urinary frequency, -urgency, -incontinence Neurology: -headache, -weakness, -tingling, -numbness, -memory loss, -falls, -dizziness Psychology: -depressed mood, -agitation, -sleep problems     Objective:   Physical Exam  BP 128/80   Pulse 96   Ht 5' 9.25" (1.759 m)   Wt 220 lb (99.8 kg)   BMI 32.25 kg/m   Wt Readings from Last 3 Encounters:  04/03/16 220 lb (99.8 kg)  10/19/15 215 lb (97.5 kg)  07/14/15 219 lb (99.3 kg)   General appearance: alert, no distress, WD/WN, white male Skin: numerous tattoos throughout - upper left and right chest, right upper chest truck, letters across upper abdomen, upper back, bilat calves, right dorsal wrist, no worrisome skin lesions HEENT: normocephalic, conjunctiva/corneas normal, sclerae anicteric, PERRLA, EOMi, nares patent, no discharge or erythema, pharynx normal, left ear drug with pearly  appearance and deformity suggesting possible cholesteatoma Oral cavity: MMM, tongue normal, teeth in good repair Neck: supple, no lymphadenopathy, no thyromegaly, no masses, normal ROM, anterior surgical scar, no bruits Chest: non tender, normal shape and expansion Heart: RRR, normal S1, S2, no murmurs Lungs: CTA bilaterally, no wheezes, rhonchi, or rales Abdomen: +bs, soft, non tender, non distended, no masses, no hepatomegaly, no splenomegaly, no bruits Back: non tender, normal ROM, no scoliosis Musculoskeletal: surgical scars bilat anterior knees, otherwise upper extremities non tender, no obvious deformity, normal ROM throughout, lower extremities non tender, no obvious deformity, normal ROM throughout Extremities: no edema, no cyanosis, no clubbing Pulses: 2+ symmetric, upper and lower extremities, normal cap refill Neurological: alert, oriented x 3, CN2-12 intact, strength normal upper extremities and lower extremities, sensation normal throughout, DTRs 2+ throughout, no cerebellar signs, gait normal Psychiatric: normal affect, behavior normal, pleasant  GU: normal male external genitalia, circumcised, left scrotum with implant s/p prior orchiectomy, otherwise nontender, no masses, no hernia, no lymphadenopathy Rectal: anus normal,  prostate WNL   Assessment  and Plan :    Encounter Diagnoses  Name Primary?  . Encounter for health maintenance examination in adult Yes  . H/O testicular cancer   . Essential hypertension   . Family history of cancer   . Family history of premature CAD   . Anal fissure   . Obesity   . Need for Tdap vaccination   . Hyperhidrosis   . Screening for prostate cancer   . Influenza vaccination declined   . Abnormal tympanic membrane of left ear   . Abnormal hearing screen     Physical exam - discussed healthy lifestyle, diet, exercise, preventative care, vaccinations, and addressed their concerns.   Hx/o cancer, family hx/o cancer - released from care  from Dr. Marin Olp after 10 years cancer free Routine labs today C/t same medication for hypertension, pending labs, likely needs to be on statin, c/t Aspirin 40m See your eye doctor yearly for routine vision care. See your dentist yearly for routine dental care including hygiene visits twice yearly.  Counseled on the Tdap (tetanus, diptheria, and acellular pertussis) vaccine.  Vaccine information sheet given. Tdap vaccine given after consent obtained.  Obesity - needs to work on lifestyle changes to lose weight  Eardrum deformity - abnormal screen.  discussed findings, suggestive of cholesteatoma, refer to ENT  Hyperhidrosis - pending labs, consider alpha blocker, anticholinergic, other medication or referral to endocrinology  reviewed GI notes from 2016 regarding anal fissure reviewed cardiology notes from 2016 including myocardial scan and echo  PSA screening today  Follow-up yearly for physical  RMartiwas seen today for annual exam.  Diagnoses and all orders for this visit:  Encounter for health maintenance examination in adult -     Comprehensive metabolic panel -     Lipid panel -     CBC -     Microalbumin / creatinine urine ratio -     Hemoglobin A1c -     PSA -     TSH  H/O testicular cancer  Essential hypertension -     Lipid panel  Family history of cancer  Family history of premature CAD  Anal fissure  Obesity -     Hemoglobin A1c  Need for Tdap vaccination  Hyperhidrosis -     TSH  Screening for prostate cancer -     PSA  Influenza vaccination declined  Abnormal tympanic membrane of left ear -     Ambulatory referral to ENT  Abnormal hearing screen -     Ambulatory referral to ENT  Other orders -     lisinopril-hydrochlorothiazide (PRINZIDE,ZESTORETIC) 20-12.5 MG tablet; Take 1 tablet by mouth daily. -     aspirin 81 MG tablet; Take 1 tablet (81 mg total) by mouth daily.

## 2016-04-04 ENCOUNTER — Other Ambulatory Visit: Payer: Self-pay | Admitting: Medical

## 2016-04-04 LAB — MICROALBUMIN / CREATININE URINE RATIO
Creatinine, Urine: 269 mg/dL (ref 20–370)
Microalb Creat Ratio: 3 mcg/mg creat (ref <30)
Microalb, Ur: 0.8 mg/dL

## 2016-04-04 MED ORDER — PRAVASTATIN SODIUM 20 MG PO TABS: 20.0000 mg | ORAL_TABLET | Freq: Every day | ORAL | 1 refills | Status: DC

## 2016-04-04 MED ORDER — OXYBUTYNIN CHLORIDE 5 MG PO TABS: 5.0000 mg | ORAL_TABLET | Freq: Two times a day (BID) | ORAL | 0 refills | Status: DC

## 2016-04-18 DIAGNOSIS — H903 Sensorineural hearing loss, bilateral: Secondary | ICD-10-CM | POA: Diagnosis not present

## 2016-04-18 DIAGNOSIS — H9313 Tinnitus, bilateral: Secondary | ICD-10-CM | POA: Diagnosis not present

## 2016-04-19 ENCOUNTER — Telehealth: Payer: Self-pay | Admitting: Medical

## 2016-04-19 NOTE — Telephone Encounter (Signed)
I read his notes from the ENT doctor.  I am glad there was no cholesterol deposits in the ears.   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  Does he see any improvement so far with sweating with the Oxybutynin?  I assume he is taking this BID.

## 2016-04-19 NOTE — Telephone Encounter (Signed)
Pt was notified. He states it has helped with the sweating

## 2016-06-10 ENCOUNTER — Telehealth: Payer: Self-pay

## 2016-06-10 MED ORDER — PRAVASTATIN SODIUM 20 MG PO TABS: 20.0000 mg | ORAL_TABLET | Freq: Every day | ORAL | 1 refills | Status: DC

## 2016-06-17 ENCOUNTER — Other Ambulatory Visit: Payer: Self-pay

## 2016-06-17 DIAGNOSIS — B078 Other viral warts: Secondary | ICD-10-CM | POA: Diagnosis not present

## 2016-06-17 MED ORDER — OXYBUTYNIN CHLORIDE 5 MG PO TABS: 5.0000 mg | ORAL_TABLET | Freq: Two times a day (BID) | ORAL | 1 refills | Status: DC

## 2016-07-19 ENCOUNTER — Ambulatory Visit (INDEPENDENT_AMBULATORY_CARE_PROVIDER_SITE_OTHER): Payer: BLUE CROSS/BLUE SHIELD | Admitting: Medical

## 2016-07-19 ENCOUNTER — Encounter: Payer: Self-pay | Admitting: Medical

## 2016-07-19 VITALS — BP 122/70 | HR 86 | Wt 224.0 lb

## 2016-07-19 DIAGNOSIS — R21 Rash and other nonspecific skin eruption: Secondary | ICD-10-CM | POA: Diagnosis not present

## 2016-07-19 MED ORDER — MUPIROCIN 2 % EX OINT: TOPICAL_OINTMENT | Freq: Three times a day (TID) | CUTANEOUS | 0 refills | Status: DC

## 2016-07-19 MED ORDER — DOXYCYCLINE HYCLATE 100 MG PO TABS: 100.0000 mg | ORAL_TABLET | Freq: Two times a day (BID) | ORAL | 0 refills | Status: DC
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
## 2016-07-26 DIAGNOSIS — M2242 Chondromalacia patellae, left knee: Secondary | ICD-10-CM | POA: Diagnosis not present

## 2016-08-06 DIAGNOSIS — M2242 Chondromalacia patellae, left knee: Secondary | ICD-10-CM | POA: Diagnosis not present

## 2016-08-06 DIAGNOSIS — M25562 Pain in left knee: Secondary | ICD-10-CM | POA: Diagnosis not present

## 2016-08-12 ENCOUNTER — Encounter (HOSPITAL_BASED_OUTPATIENT_CLINIC_OR_DEPARTMENT_OTHER): Payer: Self-pay | Admitting: *Deleted

## 2016-08-13 ENCOUNTER — Encounter (HOSPITAL_BASED_OUTPATIENT_CLINIC_OR_DEPARTMENT_OTHER)
Admission: RE | Admit: 2016-08-13 | Discharge: 2016-08-13 | Disposition: A | Discharge status: Home / Self Care | Payer: BLUE CROSS/BLUE SHIELD | Source: Ambulatory Visit | Attending: Orthopedic Surgery | Admitting: Orthopedic Surgery

## 2016-08-13 DIAGNOSIS — M1712 Unilateral primary osteoarthritis, left knee: Secondary | ICD-10-CM | POA: Diagnosis not present

## 2016-08-13 DIAGNOSIS — I10 Essential (primary) hypertension: Secondary | ICD-10-CM | POA: Diagnosis not present

## 2016-08-13 DIAGNOSIS — E669 Obesity, unspecified: Secondary | ICD-10-CM | POA: Diagnosis not present

## 2016-08-13 DIAGNOSIS — Z6831 Body mass index (BMI) 31.0-31.9, adult: Secondary | ICD-10-CM | POA: Diagnosis not present

## 2016-08-13 LAB — BASIC METABOLIC PANEL
ANION GAP: 9 (ref 5–15)
BUN: 12 mg/dL (ref 6–20)
CALCIUM: 9.6 mg/dL (ref 8.9–10.3)
CO2: 24 mmol/L (ref 22–32)
Chloride: 103 mmol/L (ref 101–111)
Creatinine, Ser: 1.04 mg/dL (ref 0.61–1.24)
Glucose, Bld: 106 mg/dL — ABNORMAL HIGH (ref 65–99)
Potassium: 4.5 mmol/L (ref 3.5–5.1)
SODIUM: 136 mmol/L (ref 135–145)

## 2016-08-13 LAB — SURGICAL PCR SCREEN
MRSA, PCR: NEGATIVE
STAPHYLOCOCCUS AUREUS: NEGATIVE

## 2016-08-13 NOTE — H&P (Signed)
  Scott Moss comes in for follow up of his left knee.  Recent interaction primarily with Dr. Alfonso Ramus in December.  This is a longstanding issue.  Previous arthroscopy with excision of medial plica, chondromalacia, debridement and lateral release in 2014.  At that time he already had significant Grade III changes patellofemoral joint with the other compartments looking good.  Did well until recurrent symptoms in 2017.  Repeat arthroscopy at that time showed further progression of his chondromalacia, numerous chondral loose bodies.  Previous releases still looked good and there was no recurrent tethering.  The other compartments and menisci all looked good.  No question he has had progressive worsening changes in the patellofemoral joint.  He got recurrent symptoms in December and he has been completely intolerable since then.  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  Getting more and more miserable.  Cortisone injection helped for a very short time, basically with Marcaine.  He comes in to discuss definitive treatment.  This is something we have eluded to in the past in regards to potential patellofemoral unicompartmental replacement.  He has reached a point where he wants to entertain going in that direction.   History, previous treatment and recent notes all reviewed.        EXAMINATION: Lungs clear to auscultation bilaterally. Heart sounds normal.  On exam he has still fairly good tracking without recurrent tethering.  Relatively significant patellofemoral crepitus and pain.  No meniscal signs.  Stable ligaments.  Full motion.  1+ effusion.    DISPOSITION:  At this point in time he wants to proceed with patellofemoral unicompartmental replacement.  For the sake of completeness I am going to repeat his MRI just to be sure there is nothing else new and his other compartments look good.  He is going to call when that is complete.  I already  filled out paperwork to do a patellofemoral unicompartmental replacement.  Outpatient basis.  That procedure, risks, benefits and complications reviewed.  More than 30 minutes spent face-to-face.  Based on his job he is going to be out of work for at least 12 weeks.  Again, final decision after we see his scan.  He understands and agrees.

## 2016-08-13 NOTE — Progress Notes (Addendum)
PT in for PAT appt. PCR screen done and EKG done, reviewed by Dr. Gifford Shave   PCR negative.

## 2016-08-15 ENCOUNTER — Encounter (HOSPITAL_BASED_OUTPATIENT_CLINIC_OR_DEPARTMENT_OTHER): Payer: Self-pay | Admitting: *Deleted

## 2016-08-15 ENCOUNTER — Ambulatory Visit (HOSPITAL_BASED_OUTPATIENT_CLINIC_OR_DEPARTMENT_OTHER): Payer: BLUE CROSS/BLUE SHIELD | Admitting: Anesthesiology

## 2016-08-15 ENCOUNTER — Encounter (HOSPITAL_BASED_OUTPATIENT_CLINIC_OR_DEPARTMENT_OTHER)
Admission: RE | Disposition: A | Discharge status: Home / Self Care | Payer: Self-pay | Source: Ambulatory Visit | Attending: Orthopedic Surgery

## 2016-08-15 ENCOUNTER — Ambulatory Visit (HOSPITAL_BASED_OUTPATIENT_CLINIC_OR_DEPARTMENT_OTHER)
Admission: RE | Admit: 2016-08-15 | Discharge: 2016-08-15 | Disposition: A | Discharge status: Home / Self Care | Payer: BLUE CROSS/BLUE SHIELD | Source: Ambulatory Visit | Attending: Orthopedic Surgery | Admitting: Orthopedic Surgery

## 2016-08-15 DIAGNOSIS — E669 Obesity, unspecified: Secondary | ICD-10-CM | POA: Diagnosis not present

## 2016-08-15 DIAGNOSIS — Z6831 Body mass index (BMI) 31.0-31.9, adult: Secondary | ICD-10-CM | POA: Diagnosis not present

## 2016-08-15 DIAGNOSIS — M1712 Unilateral primary osteoarthritis, left knee: Secondary | ICD-10-CM | POA: Diagnosis not present

## 2016-08-15 DIAGNOSIS — I10 Essential (primary) hypertension: Secondary | ICD-10-CM | POA: Insufficient documentation

## 2016-08-15 DIAGNOSIS — G8918 Other acute postprocedural pain: Secondary | ICD-10-CM | POA: Diagnosis not present

## 2016-08-15 HISTORY — DX: Unspecified osteoarthritis, unspecified site: M19.90

## 2016-08-15 HISTORY — PX: PARTIAL KNEE ARTHROPLASTY: SHX2174

## 2016-08-15 SURGERY — ARTHROPLASTY, KNEE, UNICOMPARTMENTAL
Anesthesia: General | Site: Knee | Laterality: Left

## 2016-08-15 MED ORDER — FENTANYL CITRATE (PF) 100 MCG/2ML IJ SOLN: 25.0000 ug | INTRAMUSCULAR | Status: DC | PRN

## 2016-08-15 MED ORDER — MIDAZOLAM HCL 2 MG/2ML IJ SOLN
1.0000 mg | INTRAMUSCULAR | Status: DC | PRN
  Administered 2016-08-15: 2 mg via INTRAVENOUS

## 2016-08-15 MED ORDER — CEFAZOLIN SODIUM-DEXTROSE 2-4 GM/100ML-% IV SOLN
2.0000 g | INTRAVENOUS | Status: AC
  Administered 2016-08-15: 2 g via INTRAVENOUS

## 2016-08-15 MED ORDER — LIDOCAINE 2% (20 MG/ML) 5 ML SYRINGE
INTRAMUSCULAR | Status: AC
  Filled 2016-08-15: qty 5

## 2016-08-15 MED ORDER — FENTANYL CITRATE (PF) 100 MCG/2ML IJ SOLN
INTRAMUSCULAR | Status: AC
  Filled 2016-08-15: qty 2

## 2016-08-15 MED ORDER — PROPOFOL 500 MG/50ML IV EMUL
INTRAVENOUS | Status: AC
  Filled 2016-08-15: qty 50

## 2016-08-15 MED ORDER — ONDANSETRON HCL 4 MG/2ML IJ SOLN
INTRAMUSCULAR | Status: AC
  Filled 2016-08-15: qty 2

## 2016-08-15 MED ORDER — LACTATED RINGERS IV SOLN
INTRAVENOUS | Status: DC
  Administered 2016-08-15: 09:00:00 via INTRAVENOUS

## 2016-08-15 MED ORDER — SCOPOLAMINE 1 MG/3DAYS TD PT72
MEDICATED_PATCH | TRANSDERMAL | Status: AC
  Filled 2016-08-15: qty 1

## 2016-08-15 MED ORDER — PROMETHAZINE HCL 25 MG/ML IJ SOLN: 6.2500 mg | INTRAMUSCULAR | Status: DC | PRN

## 2016-08-15 MED ORDER — CHLORHEXIDINE GLUCONATE 4 % EX LIQD: 60.0000 mL | Freq: Once | CUTANEOUS | Status: DC

## 2016-08-15 MED ORDER — ONDANSETRON HCL 4 MG PO TABS: 4.0000 mg | ORAL_TABLET | Freq: Three times a day (TID) | ORAL | 0 refills | Status: DC | PRN

## 2016-08-15 MED ORDER — BUPIVACAINE-EPINEPHRINE (PF) 0.5% -1:200000 IJ SOLN
INTRAMUSCULAR | Status: DC | PRN
  Administered 2016-08-15: 30 mL

## 2016-08-15 MED ORDER — ASPIRIN EC 325 MG PO TBEC: 325.0000 mg | DELAYED_RELEASE_TABLET | Freq: Every day | ORAL | 0 refills | Status: DC

## 2016-08-15 MED ORDER — PROPOFOL 10 MG/ML IV BOLUS
INTRAVENOUS | Status: DC | PRN
  Administered 2016-08-15: 50 mg via INTRAVENOUS
  Administered 2016-08-15: 200 mg via INTRAVENOUS

## 2016-08-15 MED ORDER — SODIUM CHLORIDE 0.9 % IR SOLN
Status: DC | PRN
  Administered 2016-08-15: 3000 mL

## 2016-08-15 MED ORDER — DEXAMETHASONE SODIUM PHOSPHATE 10 MG/ML IJ SOLN
INTRAMUSCULAR | Status: AC
  Filled 2016-08-15: qty 1

## 2016-08-15 MED ORDER — BUPIVACAINE LIPOSOME 1.3 % IJ SUSP
INTRAMUSCULAR | Status: DC | PRN
  Administered 2016-08-15: 20 mL

## 2016-08-15 MED ORDER — FENTANYL CITRATE (PF) 100 MCG/2ML IJ SOLN
50.0000 ug | INTRAMUSCULAR | Status: DC | PRN
  Administered 2016-08-15 (×2): 100 ug via INTRAVENOUS

## 2016-08-15 MED ORDER — SCOPOLAMINE 1 MG/3DAYS TD PT72
1.0000 | MEDICATED_PATCH | Freq: Once | TRANSDERMAL | Status: DC | PRN
  Administered 2016-08-15: 1.5 mg via TRANSDERMAL

## 2016-08-15 MED ORDER — OXYCODONE-ACETAMINOPHEN 5-325 MG PO TABS: 1.0000 | ORAL_TABLET | ORAL | 0 refills | Status: DC | PRN

## 2016-08-15 MED ORDER — DEXAMETHASONE SODIUM PHOSPHATE 4 MG/ML IJ SOLN
INTRAMUSCULAR | Status: DC | PRN
  Administered 2016-08-15: 10 mg via INTRAVENOUS

## 2016-08-15 MED ORDER — POTASSIUM CHLORIDE IN NACL 20-0.9 MEQ/L-% IV SOLN
INTRAVENOUS | Status: DC
Start: 2016-08-15 — End: 2016-08-15

## 2016-08-15 MED ORDER — ONDANSETRON HCL 4 MG/2ML IJ SOLN
INTRAMUSCULAR | Status: DC | PRN
  Administered 2016-08-15: 4 mg via INTRAVENOUS

## 2016-08-15 MED ORDER — LIDOCAINE HCL (CARDIAC) 20 MG/ML IV SOLN
INTRAVENOUS | Status: DC | PRN
  Administered 2016-08-15: 60 mg via INTRAVENOUS

## 2016-08-15 MED ORDER — MIDAZOLAM HCL 2 MG/2ML IJ SOLN
INTRAMUSCULAR | Status: AC
  Filled 2016-08-15: qty 2

## 2016-08-15 MED ORDER — EPHEDRINE SULFATE 50 MG/ML IJ SOLN
INTRAMUSCULAR | Status: DC | PRN
  Administered 2016-08-15: 10 mg via INTRAVENOUS
  Administered 2016-08-15: 15 mg via INTRAVENOUS
  Administered 2016-08-15: 10 mg via INTRAVENOUS

## 2016-08-15 MED ORDER — CEFAZOLIN SODIUM-DEXTROSE 2-4 GM/100ML-% IV SOLN
INTRAVENOUS | Status: AC
  Filled 2016-08-15: qty 100

## 2016-08-15 MED ORDER — SUCCINYLCHOLINE CHLORIDE 200 MG/10ML IV SOSY
PREFILLED_SYRINGE | INTRAVENOUS | Status: AC
  Filled 2016-08-15: qty 10

## 2016-08-15 MED ORDER — SODIUM CHLORIDE 0.9 % IV SOLN
INTRAVENOUS | Status: DC | PRN
  Administered 2016-08-15: 40 mL

## 2016-08-15 MED ORDER — EPHEDRINE 5 MG/ML INJ
INTRAVENOUS | Status: AC
  Filled 2016-08-15: qty 10

## 2016-08-15 MED ORDER — SUCCINYLCHOLINE CHLORIDE 20 MG/ML IJ SOLN
INTRAMUSCULAR | Status: DC | PRN
  Administered 2016-08-15: 100 mg via INTRAVENOUS

## 2016-08-15 MED ORDER — LACTATED RINGERS IV SOLN: INTRAVENOUS | Status: DC

## 2016-08-15 SURGICAL SUPPLY — 86 items
APL SKNCLS STERI-STRIP NONHPOA (GAUZE/BANDAGES/DRESSINGS) ×1
BAG DECANTER FOR FLEXI CONT (MISCELLANEOUS) ×3 IMPLANT
BANDAGE ACE 4X5 VEL STRL LF (GAUZE/BANDAGES/DRESSINGS) ×3 IMPLANT
BANDAGE ACE 6X5 VEL STRL LF (GAUZE/BANDAGES/DRESSINGS) ×3 IMPLANT
BANDAGE ESMARK 6X9 LF (GAUZE/BANDAGES/DRESSINGS) ×1 IMPLANT
BENZOIN TINCTURE PRP APPL 2/3 (GAUZE/BANDAGES/DRESSINGS) ×3 IMPLANT
BLADE SAG 18X100X1.27 (BLADE) ×3 IMPLANT
BLADE SAW SGTL 13.0X1.19X90.0M (BLADE) IMPLANT
BLADE SURG 15 STRL LF DISP TIS (BLADE) ×2 IMPLANT
BLADE SURG 15 STRL SS (BLADE) ×6
BNDG CMPR 9X6 STRL LF SNTH (GAUZE/BANDAGES/DRESSINGS) ×1
BNDG ESMARK 6X9 LF (GAUZE/BANDAGES/DRESSINGS) ×3
BOWL SMART MIX CTS (DISPOSABLE) ×3 IMPLANT
BUR SURG 4X8 MED (BURR) IMPLANT
BURR SURG 4MMX8MM MEDIUM (BURR)
BURR SURG 4X8 MED (BURR)
CEMENT BONE SIMPLEX SPEEDSET (Cement) ×3 IMPLANT
CLOSURE WOUND 1/2 X4 (GAUZE/BANDAGES/DRESSINGS) ×1
COMP FEM KNEE TROCH 8.5X4.0 (Orthopedic Implant) ×2 IMPLANT
COVER BACK TABLE 60X90IN (DRAPES) ×3 IMPLANT
CUFF TOURNIQUET SINGLE 34IN LL (TOURNIQUET CUFF) ×2 IMPLANT
DECANTER SPIKE VIAL GLASS SM (MISCELLANEOUS) ×1 IMPLANT
DRAPE EXTREMITY T 121X128X90 (DRAPE) ×3 IMPLANT
DRAPE INCISE IOBAN 66X45 STRL (DRAPES) ×3 IMPLANT
DRAPE U-SHAPE 47X51 STRL (DRAPES) ×3 IMPLANT
DURAPREP 26ML APPLICATOR (WOUND CARE) ×3 IMPLANT
ELECT REM PT RETURN 9FT ADLT (ELECTROSURGICAL) ×3
ELECTRODE REM PT RTRN 9FT ADLT (ELECTROSURGICAL) ×1 IMPLANT
GAUZE SPONGE 4X4 12PLY STRL (GAUZE/BANDAGES/DRESSINGS) ×3 IMPLANT
GAUZE XEROFORM 5X9 LF (GAUZE/BANDAGES/DRESSINGS) ×1 IMPLANT
GLOVE BIO SURGEON STRL SZ7.5 (GLOVE) ×2 IMPLANT
GLOVE BIOGEL PI IND STRL 7.0 (GLOVE) ×1 IMPLANT
GLOVE BIOGEL PI IND STRL 8 (GLOVE) IMPLANT
GLOVE BIOGEL PI INDICATOR 7.0 (GLOVE) ×6
GLOVE BIOGEL PI INDICATOR 8 (GLOVE) ×2
GLOVE ECLIPSE 6.5 STRL STRAW (GLOVE) ×2 IMPLANT
GLOVE ECLIPSE 7.0 STRL STRAW (GLOVE) ×1 IMPLANT
GLOVE SURG ORTHO 8.0 STRL STRW (GLOVE) ×3 IMPLANT
GOWN STRL REUS W/ TWL LRG LVL3 (GOWN DISPOSABLE) ×1 IMPLANT
GOWN STRL REUS W/ TWL XL LVL3 (GOWN DISPOSABLE) ×2 IMPLANT
GOWN STRL REUS W/TWL LRG LVL3 (GOWN DISPOSABLE) ×3
GOWN STRL REUS W/TWL XL LVL3 (GOWN DISPOSABLE) ×9
HANDPIECE INTERPULSE COAX TIP (DISPOSABLE) ×3
IMMOBILIZER KNEE 22 UNIV (SOFTGOODS) ×1 IMPLANT
IMMOBILIZER KNEE 24 THIGH 36 (MISCELLANEOUS) ×1 IMPLANT
IMMOBILIZER KNEE 24 UNIV (MISCELLANEOUS) ×6
IV NS IRRIG 3000ML ARTHROMATIC (IV SOLUTION) ×4 IMPLANT
KIT PIN (KITS) IMPLANT
KNEE WRAP E Z 3 GEL PACK (MISCELLANEOUS) ×5 IMPLANT
NDL SAFETY ECLIPSE 18X1.5 (NEEDLE) ×2 IMPLANT
NEEDLE HYPO 18GX1.5 SHARP (NEEDLE) ×6
NS IRRIG 1000ML POUR BTL (IV SOLUTION) ×3 IMPLANT
PACK ARTHROSCOPY DSU (CUSTOM PROCEDURE TRAY) ×3 IMPLANT
PACK BASIN DAY SURGERY FS (CUSTOM PROCEDURE TRAY) ×3 IMPLANT
PAD CAST 4YDX4 CTTN HI CHSV (CAST SUPPLIES) IMPLANT
PADDING CAST COTTON 4X4 STRL (CAST SUPPLIES)
PADDING CAST COTTON 6X4 STRL (CAST SUPPLIES) IMPLANT
PATELLA A 35MMX10MM (Knees) ×2 IMPLANT
PENCIL BUTTON HOLSTER BLD 10FT (ELECTRODE) ×3 IMPLANT
POST TAPER 11MM (Orthopedic Implant) ×2 IMPLANT
SET HNDPC FAN SPRY TIP SCT (DISPOSABLE) ×1 IMPLANT
SHEET MEDIUM DRAPE 40X70 STRL (DRAPES) ×1 IMPLANT
SLEEVE SCD COMPRESS KNEE MED (MISCELLANEOUS) ×2 IMPLANT
SPONGE LAP 18X18 X RAY DECT (DISPOSABLE) ×3 IMPLANT
STAPLER VISISTAT 35W (STAPLE) IMPLANT
STRIP CLOSURE SKIN 1/2X4 (GAUZE/BANDAGES/DRESSINGS) ×2 IMPLANT
SUCTION FRAZIER HANDLE 10FR (MISCELLANEOUS) ×2
SUCTION TUBE FRAZIER 10FR DISP (MISCELLANEOUS) ×1 IMPLANT
SUT FIBERWIRE #2 38 T-5 BLUE (SUTURE)
SUT MNCRL AB 3-0 PS2 18 (SUTURE) ×2 IMPLANT
SUT MNCRL AB 4-0 PS2 18 (SUTURE) ×3 IMPLANT
SUT VIC AB 0 CT1 27 (SUTURE) ×3
SUT VIC AB 0 CT1 27XBRD ANBCTR (SUTURE) ×1 IMPLANT
SUT VIC AB 0 SH 27 (SUTURE) IMPLANT
SUT VIC AB 1 CT1 27 (SUTURE) ×3
SUT VIC AB 1 CT1 27XBRD ANBCTR (SUTURE) IMPLANT
SUT VIC AB 2-0 SH 27 (SUTURE) ×3
SUT VIC AB 2-0 SH 27XBRD (SUTURE) ×1 IMPLANT
SUTURE FIBERWR #2 38 T-5 BLUE (SUTURE) IMPLANT
SYR 50ML LL SCALE MARK (SYRINGE) ×3 IMPLANT
SYR BULB 3OZ (MISCELLANEOUS) ×3 IMPLANT
TOWEL OR 17X24 6PK STRL BLUE (TOWEL DISPOSABLE) ×3 IMPLANT
TOWEL OR NON WOVEN STRL DISP B (DISPOSABLE) ×6 IMPLANT
TUBE CONNECTING 20'X1/4 (TUBING)
TUBE CONNECTING 20X1/4 (TUBING) ×1 IMPLANT
YANKAUER SUCT BULB TIP NO VENT (SUCTIONS) ×3 IMPLANT

## 2016-08-15 NOTE — Discharge Instructions (Signed)
Post Anesthesia Home Care Instructions  Activity: Get plenty of rest for the remainder of the day. A responsible adult should stay with you for 24 hours following the procedure.  For the next 24 hours, DO NOT: -Drive a car -Paediatric nurse -Drink alcoholic beverages -Take any medication unless instructed by your physician -Make any legal decisions or sign important papers.  Meals: Start with liquid foods such as gelatin or soup. Progress to regular foods as tolerated. Avoid greasy, spicy, heavy foods. If nausea and/or vomiting occur, drink only clear liquids until the nausea and/or vomiting subsides. Call your physician if vomiting continues.  Special Instructions/Symptoms: Your throat may feel dry or sore from the anesthesia or the breathing tube placed in your throat during surgery. If this causes discomfort, gargle with warm salt water. The discomfort should disappear within 24 hours.  If you had a scopolamine patch placed behind your ear for the management of post- operative nausea and/or vomiting:  1. The medication in the patch is effective for 72 hours, after which it should be removed.  Wrap patch in a tissue and discard in the trash. Wash hands thoroughly with soap and water. 2. You may remove the patch earlier than 72 hours if you experience unpleasant side effects which may include dry mouth, dizziness or visual disturbances. 3. Avoid touching the patch. Wash your hands with soap and water after contact with the patch.   Regional Anesthesia Blocks  1. Numbness or the inability to move the "blocked" extremity may last from 3-48 hours after placement. The length of time depends on the medication injected and your individual response to the medication. If the numbness is not going away after 48 hours, call your surgeon.  2. The extremity that is blocked will need to be protected until the numbness is gone and the  Strength has returned. Because you cannot feel it, you will need  to take extra care to avoid injury. Because it may be weak, you may have difficulty moving it or using it. You may not know what position it is in without looking at it while the block is in effect.  3. For blocks in the legs and feet, returning to weight bearing and walking needs to be done carefully. You will need to wait until the numbness is entirely gone and the strength has returned. You should be able to move your leg and foot normally before you try and bear weight or walk. You will need someone to be with you when you first try to ensure you do not fall and possibly risk injury.  4. Bruising and tenderness at the needle site are common side effects and will resolve in a few days.  5. Persistent numbness or new problems with movement should be communicated to the surgeon or the [facility] [phone] [place] [phone]).Call your surgeon if you experience:   1.  Fever over 101.0. 2.  Inability to urinate. 3.  Nausea and/or vomiting. 4.  Extreme swelling or bruising at the surgical site. 5.  Continued bleeding from the incision. 6.  Increased pain, redness or drainage from the incision. 7.  Problems related to your pain medication. 8.  Any problems and/or concerns   INSTRUCTIONS AFTER JOINT REPLACEMENT   o Remove items at home which could result in a fall. This includes throw rugs or furniture in walking pathways o ICE to the affected joint every three hours while awake for 30 minutes at a time, for at  least the first 3-5 days, and then as needed for pain and swelling.  Continue to use ice for pain and swelling. You may notice swelling that will progress down to the foot and ankle.  This is normal after surgery.  Elevate your leg when you are not up walking on it.   o Continue to use the breathing machine you got in the hospital (incentive spirometer) which will help keep your temperature down.  It is common for your temperature to cycle up and down  following surgery, especially at night when you are not up moving around and exerting yourself.  The breathing machine keeps your lungs expanded and your temperature down.   DIET:  As you were doing prior to hospitalization, we recommend a well-balanced diet.  DRESSING / WOUND CARE / SHOWERING  Keep the surgical dressing until follow up.  The dressing is water proof, so you can shower without any extra covering.  IF THE DRESSING FALLS OFF or the wound gets wet inside, change the dressing with sterile gauze.  Please use good hand washing techniques before changing the dressing.  Do not use any lotions or creams on the incision until instructed by your surgeon.    ACTIVITY  o Increase activity slowly as tolerated, but follow the weight bearing instructions below.   o No driving for 6 weeks or until further direction given by your physician.  You cannot drive while taking narcotics.  o No lifting or carrying greater than 10 lbs. until further directed by your surgeon. o Avoid periods of inactivity such as sitting longer than an hour when not asleep. This helps prevent blood clots.  o You may return to work once you are authorized by your doctor.     WEIGHT BEARING   Weight bearing as tolerated with assist device (walker, cane, etc) as directed, use it as long as suggested by your surgeon or therapist, typically at least 4-6 weeks.   EXERCISES  Results after joint replacement surgery are often greatly improved when you follow the exercise, range of motion and muscle strengthening exercises prescribed by your doctor. Safety measures are also important to protect the joint from further injury. Any time any of these exercises cause you to have increased pain or swelling, decrease what you are doing until you are comfortable again and then slowly increase them. If you have problems or questions, call your caregiver or physical therapist for advice.   Rehabilitation is important following a joint  replacement. After just a few days of immobilization, the muscles of the leg can become weakened and shrink (atrophy).  These exercises are designed to build up the tone and strength of the thigh and leg muscles and to improve motion. Often times heat used for twenty to thirty minutes before working out will loosen up your tissues and help with improving the range of motion but do not use heat for the first two weeks following surgery (sometimes heat can increase post-operative swelling).   These exercises can be done on a training (exercise) mat, on the floor, on a table or on a bed. Use whatever works the best and is most comfortable for you.    Use music or television while you are exercising so that the exercises are a pleasant break in your day. This will make your life better with the exercises acting as a break in your routine that you can look forward to.   Perform all exercises about fifteen times, three times per day or  as directed.  You should exercise both the operative leg and the other leg as well.  Exercises include:    Quad Sets - Tighten up the muscle on the front of the thigh (Quad) and hold for 5-10 seconds.    Straight Leg Raises - With your knee straight (if you were given a brace, keep it on), lift the leg to 60 degrees, hold for 3 seconds, and slowly lower the leg.  Perform this exercise against resistance later as your leg gets stronger.   Leg Slides: Lying on your back, slowly slide your foot toward your buttocks, bending your knee up off the floor (only go as far as is comfortable). Then slowly slide your foot back down until your leg is flat on the floor again.   Angel Wings: Lying on your back spread your legs to the side as far apart as you can without causing discomfort.   Hamstring Strength:  Lying on your back, push your heel against the floor with your leg straight by tightening up the muscles of your buttocks.  Repeat, but this time bend your knee to a comfortable  angle, and push your heel against the floor.  You may put a pillow under the heel to make it more comfortable if necessary.   A rehabilitation program following joint replacement surgery can speed recovery and prevent re-injury in the future due to weakened muscles. Contact your doctor or a physical therapist for more information on knee rehabilitation.    CONSTIPATION  Constipation is defined medically as fewer than three stools per week and severe constipation as less than one stool per week.  Even if you have a regular bowel pattern at home, your normal regimen is likely to be disrupted due to multiple reasons following surgery.  Combination of anesthesia, postoperative narcotics, change in appetite and fluid intake all can affect your bowels.   YOU MUST use at least one of the following options; they are listed in order of increasing strength to get the job done.  They are all available over the counter, and you may need to use some, POSSIBLY even all of these options:    Drink plenty of fluids (prune juice may be helpful) and high fiber foods Colace 100 mg by mouth twice a day  Senokot for constipation as directed and as needed Dulcolax (bisacodyl), take with full glass of water  Miralax (polyethylene glycol) once or twice a day as needed.  If you have tried all these things and are unable to have a bowel movement in the first 3-4 days after surgery call either your surgeon or your primary doctor.    If you experience loose stools or diarrhea, hold the medications until you stool forms back up.  If your symptoms do not get better within 1 week or if they get worse, check with your doctor.  If you experience "the worst abdominal pain ever" or develop nausea or vomiting, please contact the office immediately for further recommendations for treatment.   ITCHING:  If you experience itching with your medications, try taking only a single pain pill, or even half a pain pill at a time.  You can  also use Benadryl over the counter for itching or also to help with sleep.   TED HOSE STOCKINGS:  Use stockings on both legs until for at least 2 weeks or as directed by physician office. They may be removed at night for sleeping.  MEDICATIONS:  See your medication summary on the After  Visit Summary that nursing will review with you.  You may have some home medications which will be placed on hold until you complete the course of blood thinner medication.  It is important for you to complete the blood thinner medication as prescribed.  PRECAUTIONS:  If you experience chest pain or shortness of breath - call 911 immediately for transfer to the hospital emergency department.   If you develop a fever greater that 101 F, purulent drainage from wound, increased redness or drainage from wound, foul odor from the wound/dressing, or calf pain - CONTACT YOUR SURGEON.                                                   FOLLOW-UP APPOINTMENTS:  If you do not already have a post-op appointment, please call the office for an appointment to be seen by your surgeon.  Guidelines for how soon to be seen are listed in your After Visit Summary, but are typically between 1-4 weeks after surgery.  OTHER INSTRUCTIONS:   Knee Replacement:  Do not place pillow under knee, focus on keeping the knee straight while resting. CPM instructions: 0-90 degrees, 2 hours in the morning, 2 hours in the afternoon, and 2 hours in the evening. Place foam block, curve side up under heel at all times except when in CPM or when walking.  DO NOT modify, tear, cut, or change the foam block in any way.  MAKE SURE YOU:   Understand these instructions.   Get help right away if you are not doing well or get worse.    Thank you for letting us be a part of your medical care team.  It is a privilege we respect greatly.  We hope these instructions will help you stay on track for a fast and full recovery!    Discharge Instructions After  Orthopedic Procedures:  *You may feel tired and weak following your procedure. It is recommended that you limit physical activity for the next 24 hours and rest at home for the remainder of today and tomorrow. *No strenuous activity should be started without your doctor's permission.  Elevate the extremity that you had surgery on to a level above your heart. This should continue for 48 hours or as instructed by your doctor.  If you had hand, arm or shoulder surgery you should move your fingers frequently unless otherwise instructed by your doctor.  If you had foot, knee or leg surgery you should wiggle your toes frequently unless otherwise instructed by your doctor.  Follow your doctor's exact instructions for activity at home. Use your home equipment as instructed. (Crutches, hard shoes, slings etc.)  Limit your activity as instructed by your doctor.  Report to your doctor should any of the following occur: 1. Extreme swelling of your fingers or toes. 2. Inability to wiggle your fingers or toes. 3. Coldness, pale or bluish color in your fingers or toes. 4. Loss of sensation, numbness or tingling of your fingers or toes. 5. Unusual smell or odor from under your dressing or cast. 6. Excessive bleeding or drainage from the surgical site. 7. Pain not relieved by medication your doctor has prescribed for you. 8. Cast or dressing too tight (do not get your dressing or cast wet or put anything under          your dressing  or cast.)  *Do not change your dressing unless instructed by your doctor or discharge nurse. Then follow exact instructions.  *Follow labeled instructions for any medications that your doctor may have prescribed for you. *Should any questions or complications develop following your procedure, PLEASE CONTACT YOUR DOCTOR.   Post Anesthesia Home Care Instructions  Activity: Get plenty of rest for the remainder of the day. A responsible adult should stay with you for 24  hours following the procedure.  For the next 24 hours, DO NOT: -Drive a car -Paediatric nurse -Drink alcoholic beverages -Take any medication unless instructed by your physician -Make any legal decisions or sign important papers.  Meals: Start with liquid foods such as gelatin or soup. Progress to regular foods as tolerated. Avoid greasy, spicy, heavy foods. If nausea and/or vomiting occur, drink only clear liquids until the nausea and/or vomiting subsides. Call your physician if vomiting continues.  Special Instructions/Symptoms: Your throat may feel dry or sore from the anesthesia or the breathing tube placed in your throat during surgery. If this causes discomfort, gargle with warm salt water. The discomfort should disappear within 24 hours.  If you had a scopolamine patch placed behind your ear for the management of post- operative nausea and/or vomiting:  1. The medication in the patch is effective for 72 hours, after which it should be removed.  Wrap patch in a tissue and discard in the trash. Wash hands thoroughly with soap and water. 2. You may remove the patch earlier than 72 hours if you experience unpleasant side effects which may include dry mouth, dizziness or visual disturbances. 3. Avoid touching the patch. Wash your hands with soap and water after contact with the patch.    Regional Anesthesia Blocks  1. Numbness or the inability to move the "blocked" extremity may last from 3-48 hours after placement. The length of time depends on the medication injected and your individual response to the medication. If the numbness is not going away after 48 hours, call your surgeon.  2. The extremity that is blocked will need to be protected until the numbness is gone and the  Strength has returned. Because you cannot feel it, you will need to take extra care to avoid injury. Because it may be weak, you may have difficulty moving it or using it. You may not know what position it is in  without looking at it while the block is in effect.  3. For blocks in the legs and feet, returning to weight bearing and walking needs to be done carefully. You will need to wait until the numbness is entirely gone and the strength has returned. You should be able to move your leg and foot normally before you try and bear weight or walk. You will need someone to be with you when you first try to ensure you do not fall and possibly risk injury.  4. Bruising and tenderness at the needle site are common side effects and will resolve in a few days.  5. Persistent numbness or new problems with movement should be communicated to the surgeon or the [facility] [phone] [place] [phone]).

## 2016-08-15 NOTE — Anesthesia Preprocedure Evaluation (Signed)
Anesthesia Evaluation  Patient identified by MRN, date of birth, ID band Patient awake    Reviewed: Allergy & Precautions, NPO status , Patient's Chart, lab work & pertinent test results  History of Anesthesia Complications (+) PONV and history of anesthetic complications  Airway Mallampati: II  TM Distance: >3 FB Neck ROM: Full    Dental  (+) Teeth Intact, Dental Advisory Given   Pulmonary neg pulmonary ROS,    Pulmonary exam normal breath sounds clear to auscultation       Cardiovascular hypertension, Pt. on medications Normal cardiovascular exam Rhythm:Regular Rate:Normal  HLD  Echo 07/18/15: Study Conclusions  - Left ventricle: The cavity size was normal. Wall thickness was increased in a pattern of mild LVH. Systolic function was normal. The estimated ejection fraction was in the range of 55% to 60%. Wall motion was normal; there were no regional wall motion abnormalities. Left ventricular diastolic function parameters were normal. - Aortic valve: There was no stenosis. - Mitral valve: There was no significant regurgitation. - Right ventricle: The cavity size was normal. Systolic function was normal. - Pulmonary arteries: No complete TR doppler jet so unable to estimate PA systolic pressure. - Inferior vena cava: The vessel was normal in size. The   respirophasic diameter changes were in the normal range (= 50%), consistent with normal central venous pressure.  Impressions:  - Normal LV size with mild LV hypertrophy. EF 55-60%. Normal diastolic function. Normal RV size and systolic function. No significant valvular abnormalities.   Neuro/Psych negative neurological ROS  negative psych ROS   GI/Hepatic negative GI ROS, Neg liver ROS,   Endo/Other  Obesity   Renal/GU negative Renal ROS   Testicular cancer    Musculoskeletal  (+) Arthritis , Osteoarthritis,    Abdominal   Peds  Hematology negative  hematology ROS (+)   Anesthesia Other Findings Day of surgery medications reviewed with the patient.  Reproductive/Obstetrics                             Anesthesia Physical Anesthesia Plan  ASA: II  Anesthesia Plan: General   Post-op Pain Management: GA combined w/ Regional for post-op pain   Induction: Intravenous  Airway Management Planned: Oral ETT  Additional Equipment:   Intra-op Plan:   Post-operative Plan: Extubation in OR  Informed Consent: I have reviewed the patients History and Physical, chart, labs and discussed the procedure including the risks, benefits and alternatives for the proposed anesthesia with the patient or authorized representative who has indicated his/her understanding and acceptance.   Dental advisory given  Plan Discussed with: CRNA  Anesthesia Plan Comments: (Risks/benefits of general anesthesia discussed with patient including risk of damage to teeth, lips, gum, and tongue, nausea/vomiting, allergic reactions to medications, and the possibility of heart attack, stroke and death.  All patient questions answered.  Patient wishes to proceed.)        Anesthesia Quick Evaluation

## 2016-08-15 NOTE — Anesthesia Procedure Notes (Signed)
Procedure Name: Intubation Performed by: Terrance Mass Pre-anesthesia Checklist: Patient identified, Emergency Drugs available, Suction available and Patient being monitored Patient Re-evaluated:Patient Re-evaluated prior to inductionOxygen Delivery Method: Circle system utilized Preoxygenation: Pre-oxygenation with 100% oxygen Intubation Type: IV induction Ventilation: Mask ventilation without difficulty Laryngoscope Size: Miller and 2 Grade View: Grade II Tube type: Oral Tube size: 7.0 mm Number of attempts: 1 Airway Equipment and Method: Stylet and Oral airway Placement Confirmation: ETT inserted through vocal cords under direct vision,  positive ETCO2 and breath sounds checked- equal and bilateral Secured at: 23 cm Tube secured with: Tape Dental Injury: Teeth and Oropharynx as per pre-operative assessment

## 2016-08-15 NOTE — Progress Notes (Signed)
Assisted Dr. Gifford Shave with left, ultrasound guided, femoral block. Side rails up, monitors on throughout procedure. See vital signs in flow sheet. Tolerated Procedure well.

## 2016-08-15 NOTE — Transfer of Care (Signed)
Immediate Anesthesia Transfer of Care Note  Patient: Scott Moss  Procedure(s) Performed: Procedure(s): PATELLA FEMORAL REPLACEMENT LEFT KNEE (Left)  Patient Location: PACU  Anesthesia Type:General  Level of Consciousness: awake and sedated  Airway & Oxygen Therapy: Patient Spontanous Breathing and Patient connected to face mask oxygen  Post-op Assessment: Report given to RN and Post -op Vital signs reviewed and stable  Post vital signs: Reviewed and stable  Last Vitals:  Vitals:   08/15/16 1015 08/15/16 1030  BP: 111/70 116/67  Pulse: 87 82  Resp: 16 13  Temp:      Last Pain:  Vitals:   08/15/16 0918  TempSrc: Oral  PainSc: 6       Patients Stated Pain Goal: 4 ([phone])  Complications: No apparent anesthesia complications

## 2016-08-15 NOTE — Anesthesia Postprocedure Evaluation (Signed)
Anesthesia Post Note  Patient: Scott Moss  Procedure(s) Performed: Procedure(s) (LRB): PATELLA FEMORAL REPLACEMENT LEFT KNEE (Left)  Patient location during evaluation: PACU Anesthesia Type: General and Regional Level of consciousness: awake and alert Pain management: pain level controlled Vital Signs Assessment: post-procedure vital signs reviewed and stable Respiratory status: spontaneous breathing, nonlabored ventilation, respiratory function stable and patient connected to nasal cannula oxygen Cardiovascular status: blood pressure returned to baseline and stable Postop Assessment: no signs of nausea or vomiting Anesthetic complications: no       Last Vitals:  Vitals:   08/15/16 1426 08/15/16 1458  BP:  123/65  Pulse: 88 84  Resp: 15 20  Temp:  36.4 C    Last Pain:  Vitals:   08/15/16 1458  TempSrc: Oral  PainSc: 0-No pain                 Catalina Gravel

## 2016-08-15 NOTE — Anesthesia Procedure Notes (Signed)
Anesthesia Regional Block:  Femoral nerve block  Pre-Anesthetic Checklist: ,, timeout performed, Correct Patient, Correct Site, Correct Laterality, Correct Procedure, Correct Position, site marked, Risks and benefits discussed,  Surgical consent,  Pre-op evaluation,  At surgeon's request and post-op pain management  Laterality: Left  Prep: chloraprep       Needles:  Injection technique: Single-shot  Needle Type: Echogenic Needle     Needle Length: 9cm 9 cm Needle Gauge: 21 and 21 G    Additional Needles:  Procedures: ultrasound guided (picture in chart) Femoral nerve block Narrative:  Start time: 08/15/2016 9:40 AM End time: 08/15/2016 9:45 AM Injection made incrementally with aspirations every 5 mL.  Performed by: Personally  Anesthesiologist: Catalina Gravel  Additional Notes: No pain on injection. No increased resistance to injection. Injection made in 5cc increments.  Good needle visualization.  Patient tolerated procedure well.

## 2016-08-15 NOTE — Interval H&P Note (Signed)
History and Physical Interval Note:  08/15/2016 8:57 AM  Scott Moss  has presented today for surgery, with the diagnosis of LEFT KNEE DJD  The various methods of treatment have been discussed with the patient and family. After consideration of risks, benefits and other options for treatment, the patient has consented to  Procedure(s): PATELLA FEMORAL REPLACEMENT LEFT KNEE (Left) as a surgical intervention .  The patient's history has been reviewed, patient examined, no change in status, stable for surgery.  I have reviewed the patient's chart and labs.  Questions were answered to the patient's satisfaction.     [name]

## 2016-08-16 NOTE — Op Note (Signed)
NAMEPAM, SEATS                 ACCOUNT NO.:  [id number]  MEDICAL RECORD NO.:  [id number]  LOCATION:                                FACILITY:  [facility]  PHYSICIAN:  [name], M.D. DATE OF BIRTH:  1976-01-06  DATE OF PROCEDURE:  08/15/2016 DATE OF DISCHARGE:  08/15/2016                              OPERATIVE REPORT   PREOPERATIVE DIAGNOSIS:  Left knee end-stage arthritis, patellofemoral joint.  Primary localized.  POSTOPERATIVE DIAGNOSIS:  Left knee end-stage arthritis, patellofemoral joint.  Primary localized.  PROCEDURE:  Left knee unicompartmental replacement patellofemoral joint utilizing Arthrosurface Stryker prosthesis.  A press-fit 8 x 5 x 4 trochlear component with an 11 mm peg.  A cemented medial offset 35 mm patellar component.  SURGEON:  [name], M.D.  ASSISTANT:  Chriss Czar, PA, present throughout the entire case and necessary for timely completion of procedure.  ANESTHESIA:  General.  BLOOD LOSS:  Minimal.  SPECIMENS:  None.  CULTURES:  None.  COMPLICATIONS:  None.  DRESSINGS:  Soft compressive knee immobilizer.  TOURNIQUET TIME:  40 minutes.  DESCRIPTION OF PROCEDURE:  The patient was brought to the operating room, placed on the operating table in supine position.  After adequate anesthesia had been obtained, tourniquet applied, prepped and draped in usual sterile fashion.  Exsanguinated with elevation of Esmarch, tourniquet inflated to 350 mmHg.  Longitudinal incision above the patella down to tibial tubercle.  Medial arthrotomy, vastus splitting. Knee exposed.  Medial and lateral compartment and cruciate ligaments of menisci intact.  Grade 4 change on the patella.  Trochlea not as bad. Utilizing Arthrosurface instrumentation, the trochlea was measured, reamed, and prepared for an 8 x 5 x 4 component.  Once preparation was complete, the peg was inserted into the femur.  Trochlear component hammered in place.  Nice coverage and nice  interface surrounding the cartilage.  Patella exposed.  Posterior 10 mm removed.  Drilled, sized, and fitted for a 35-mm component.  After trials, the compartment was firmly cemented.  At completion with the cement hardened, I was very pleased with resurfacing, alignment, and patellar tracking throughout. Wound thoroughly irrigated.  Soft tissue was injected with Exparel. Arthrotomy closed with Vicryl. Subcutaneous and subcuticular closure.  Sterile compressive dressing applied.  Tourniquet deflated and removed.  Knee immobilizer applied. Anesthesia reversed.  Brought to the recovery room.  Tolerated the surgery well.  No complications.     [name], M.D.     DFM/MEDQ  D:  08/15/2016  T:  08/16/2016  Job:  [id number]

## 2016-08-20 ENCOUNTER — Encounter (HOSPITAL_BASED_OUTPATIENT_CLINIC_OR_DEPARTMENT_OTHER): Payer: Self-pay | Admitting: Orthopedic Surgery

## 2016-08-23 DIAGNOSIS — M1712 Unilateral primary osteoarthritis, left knee: Secondary | ICD-10-CM | POA: Diagnosis not present

## 2016-08-28 DIAGNOSIS — M25562 Pain in left knee: Secondary | ICD-10-CM | POA: Diagnosis not present

## 2016-08-28 DIAGNOSIS — M25462 Effusion, left knee: Secondary | ICD-10-CM | POA: Diagnosis not present

## 2016-08-28 DIAGNOSIS — M25662 Stiffness of left knee, not elsewhere classified: Secondary | ICD-10-CM | POA: Diagnosis not present

## 2016-08-29 DIAGNOSIS — M25562 Pain in left knee: Secondary | ICD-10-CM | POA: Diagnosis not present

## 2016-08-29 DIAGNOSIS — M25462 Effusion, left knee: Secondary | ICD-10-CM | POA: Diagnosis not present

## 2016-08-29 DIAGNOSIS — M25662 Stiffness of left knee, not elsewhere classified: Secondary | ICD-10-CM | POA: Diagnosis not present

## 2016-08-30 DIAGNOSIS — M25662 Stiffness of left knee, not elsewhere classified: Secondary | ICD-10-CM | POA: Diagnosis not present

## 2016-08-30 DIAGNOSIS — M25462 Effusion, left knee: Secondary | ICD-10-CM | POA: Diagnosis not present

## 2016-08-30 DIAGNOSIS — M25562 Pain in left knee: Secondary | ICD-10-CM | POA: Diagnosis not present

## 2016-09-02 DIAGNOSIS — M25562 Pain in left knee: Secondary | ICD-10-CM | POA: Diagnosis not present

## 2016-09-02 DIAGNOSIS — M25462 Effusion, left knee: Secondary | ICD-10-CM | POA: Diagnosis not present

## 2016-09-02 DIAGNOSIS — M25662 Stiffness of left knee, not elsewhere classified: Secondary | ICD-10-CM | POA: Diagnosis not present

## 2016-09-04 DIAGNOSIS — M25462 Effusion, left knee: Secondary | ICD-10-CM | POA: Diagnosis not present

## 2016-09-04 DIAGNOSIS — M25562 Pain in left knee: Secondary | ICD-10-CM | POA: Diagnosis not present

## 2016-09-04 DIAGNOSIS — M25662 Stiffness of left knee, not elsewhere classified: Secondary | ICD-10-CM | POA: Diagnosis not present

## 2016-09-06 DIAGNOSIS — M25562 Pain in left knee: Secondary | ICD-10-CM | POA: Diagnosis not present

## 2016-09-06 DIAGNOSIS — M25462 Effusion, left knee: Secondary | ICD-10-CM | POA: Diagnosis not present

## 2016-09-06 DIAGNOSIS — M25662 Stiffness of left knee, not elsewhere classified: Secondary | ICD-10-CM | POA: Diagnosis not present

## 2016-09-09 DIAGNOSIS — M25462 Effusion, left knee: Secondary | ICD-10-CM | POA: Diagnosis not present

## 2016-09-09 DIAGNOSIS — M25662 Stiffness of left knee, not elsewhere classified: Secondary | ICD-10-CM | POA: Diagnosis not present

## 2016-09-09 DIAGNOSIS — M25562 Pain in left knee: Secondary | ICD-10-CM | POA: Diagnosis not present

## 2016-09-11 DIAGNOSIS — M25562 Pain in left knee: Secondary | ICD-10-CM | POA: Diagnosis not present

## 2016-09-11 DIAGNOSIS — M25662 Stiffness of left knee, not elsewhere classified: Secondary | ICD-10-CM | POA: Diagnosis not present

## 2016-09-11 DIAGNOSIS — M25462 Effusion, left knee: Secondary | ICD-10-CM | POA: Diagnosis not present

## 2016-09-13 DIAGNOSIS — M25662 Stiffness of left knee, not elsewhere classified: Secondary | ICD-10-CM | POA: Diagnosis not present

## 2016-09-13 DIAGNOSIS — M25562 Pain in left knee: Secondary | ICD-10-CM | POA: Diagnosis not present

## 2016-09-13 DIAGNOSIS — M25462 Effusion, left knee: Secondary | ICD-10-CM | POA: Diagnosis not present

## 2016-09-16 DIAGNOSIS — M25462 Effusion, left knee: Secondary | ICD-10-CM | POA: Diagnosis not present

## 2016-09-16 DIAGNOSIS — M25562 Pain in left knee: Secondary | ICD-10-CM | POA: Diagnosis not present

## 2016-09-16 DIAGNOSIS — M25662 Stiffness of left knee, not elsewhere classified: Secondary | ICD-10-CM | POA: Diagnosis not present

## 2016-09-18 DIAGNOSIS — M25562 Pain in left knee: Secondary | ICD-10-CM | POA: Diagnosis not present

## 2016-09-18 DIAGNOSIS — M25662 Stiffness of left knee, not elsewhere classified: Secondary | ICD-10-CM | POA: Diagnosis not present

## 2016-09-18 DIAGNOSIS — M25462 Effusion, left knee: Secondary | ICD-10-CM | POA: Diagnosis not present

## 2016-09-20 DIAGNOSIS — M25462 Effusion, left knee: Secondary | ICD-10-CM | POA: Diagnosis not present

## 2016-09-20 DIAGNOSIS — M25662 Stiffness of left knee, not elsewhere classified: Secondary | ICD-10-CM | POA: Diagnosis not present

## 2016-09-20 DIAGNOSIS — M25562 Pain in left knee: Secondary | ICD-10-CM | POA: Diagnosis not present

## 2016-09-23 DIAGNOSIS — M25562 Pain in left knee: Secondary | ICD-10-CM | POA: Diagnosis not present

## 2016-09-23 DIAGNOSIS — M25462 Effusion, left knee: Secondary | ICD-10-CM | POA: Diagnosis not present

## 2016-09-23 DIAGNOSIS — M25662 Stiffness of left knee, not elsewhere classified: Secondary | ICD-10-CM | POA: Diagnosis not present

## 2016-09-25 DIAGNOSIS — M25562 Pain in left knee: Secondary | ICD-10-CM | POA: Diagnosis not present

## 2016-09-25 DIAGNOSIS — M25662 Stiffness of left knee, not elsewhere classified: Secondary | ICD-10-CM | POA: Diagnosis not present

## 2016-09-25 DIAGNOSIS — M25462 Effusion, left knee: Secondary | ICD-10-CM | POA: Diagnosis not present

## 2016-09-27 DIAGNOSIS — M1712 Unilateral primary osteoarthritis, left knee: Secondary | ICD-10-CM | POA: Diagnosis not present

## 2016-10-03 DIAGNOSIS — M25662 Stiffness of left knee, not elsewhere classified: Secondary | ICD-10-CM | POA: Diagnosis not present

## 2016-10-03 DIAGNOSIS — M25462 Effusion, left knee: Secondary | ICD-10-CM | POA: Diagnosis not present

## 2016-10-03 DIAGNOSIS — M25562 Pain in left knee: Secondary | ICD-10-CM | POA: Diagnosis not present

## 2016-10-09 DIAGNOSIS — M25662 Stiffness of left knee, not elsewhere classified: Secondary | ICD-10-CM | POA: Diagnosis not present

## 2016-10-09 DIAGNOSIS — M25462 Effusion, left knee: Secondary | ICD-10-CM | POA: Diagnosis not present

## 2016-10-09 DIAGNOSIS — M25562 Pain in left knee: Secondary | ICD-10-CM | POA: Diagnosis not present

## 2016-10-19 ENCOUNTER — Other Ambulatory Visit: Payer: Self-pay | Admitting: Medical

## 2016-10-28 DIAGNOSIS — M25662 Stiffness of left knee, not elsewhere classified: Secondary | ICD-10-CM | POA: Diagnosis not present

## 2016-10-28 DIAGNOSIS — M25562 Pain in left knee: Secondary | ICD-10-CM | POA: Diagnosis not present

## 2016-10-28 DIAGNOSIS — M25462 Effusion, left knee: Secondary | ICD-10-CM | POA: Diagnosis not present

## 2016-11-04 DIAGNOSIS — M25562 Pain in left knee: Secondary | ICD-10-CM | POA: Diagnosis not present

## 2016-11-04 DIAGNOSIS — M25662 Stiffness of left knee, not elsewhere classified: Secondary | ICD-10-CM | POA: Diagnosis not present

## 2016-11-04 DIAGNOSIS — M25462 Effusion, left knee: Secondary | ICD-10-CM | POA: Diagnosis not present

## 2016-11-08 DIAGNOSIS — M1712 Unilateral primary osteoarthritis, left knee: Secondary | ICD-10-CM | POA: Diagnosis not present

## 2016-11-16 ENCOUNTER — Emergency Department (HOSPITAL_COMMUNITY): Payer: BLUE CROSS/BLUE SHIELD

## 2016-11-16 ENCOUNTER — Ambulatory Visit (HOSPITAL_COMMUNITY)
Admission: EM | Admit: 2016-11-16 | Discharge: 2016-11-16 | Disposition: A | Discharge status: Home / Self Care | Payer: BLUE CROSS/BLUE SHIELD

## 2016-11-16 ENCOUNTER — Encounter (HOSPITAL_COMMUNITY): Payer: Self-pay | Admitting: *Deleted

## 2016-11-16 ENCOUNTER — Emergency Department (HOSPITAL_COMMUNITY)
Admission: EM | Admit: 2016-11-16 | Discharge: 2016-11-16 | Disposition: A | Discharge status: Home / Self Care | Payer: BLUE CROSS/BLUE SHIELD | Attending: Emergency Medicine | Admitting: Emergency Medicine

## 2016-11-16 DIAGNOSIS — Z8547 Personal history of malignant neoplasm of testis: Secondary | ICD-10-CM | POA: Diagnosis not present

## 2016-11-16 DIAGNOSIS — Y929 Unspecified place or not applicable: Secondary | ICD-10-CM | POA: Insufficient documentation

## 2016-11-16 DIAGNOSIS — M25511 Pain in right shoulder: Secondary | ICD-10-CM | POA: Diagnosis not present

## 2016-11-16 DIAGNOSIS — Y999 Unspecified external cause status: Secondary | ICD-10-CM | POA: Diagnosis not present

## 2016-11-16 DIAGNOSIS — I10 Essential (primary) hypertension: Secondary | ICD-10-CM | POA: Insufficient documentation

## 2016-11-16 DIAGNOSIS — M4322 Fusion of spine, cervical region: Secondary | ICD-10-CM | POA: Diagnosis not present

## 2016-11-16 DIAGNOSIS — Y9389 Activity, other specified: Secondary | ICD-10-CM | POA: Diagnosis not present

## 2016-11-16 DIAGNOSIS — S4991XA Unspecified injury of right shoulder and upper arm, initial encounter: Secondary | ICD-10-CM | POA: Diagnosis not present

## 2016-11-16 DIAGNOSIS — R079 Chest pain, unspecified: Secondary | ICD-10-CM | POA: Diagnosis not present

## 2016-11-16 DIAGNOSIS — Z7982 Long term (current) use of aspirin: Secondary | ICD-10-CM | POA: Diagnosis not present

## 2016-11-16 DIAGNOSIS — Z79899 Other long term (current) drug therapy: Secondary | ICD-10-CM | POA: Diagnosis not present

## 2016-11-16 HISTORY — DX: Pure hypercholesterolemia, unspecified: E78.00

## 2016-11-16 MED ORDER — IBUPROFEN 800 MG PO TABS: 800.0000 mg | ORAL_TABLET | Freq: Three times a day (TID) | ORAL | 0 refills | Status: DC

## 2016-11-16 MED ORDER — CYCLOBENZAPRINE HCL 10 MG PO TABS: 10.0000 mg | ORAL_TABLET | Freq: Three times a day (TID) | ORAL | 0 refills | Status: DC | PRN

## 2016-11-16 MED ORDER — OXYCODONE-ACETAMINOPHEN 5-325 MG PO TABS
2.0000 | ORAL_TABLET | Freq: Once | ORAL | Status: AC
Start: 2016-11-16 — End: 2016-11-16
  Administered 2016-11-16: 2 via ORAL
  Filled 2016-11-16: qty 2

## 2016-11-16 MED ORDER — OXYCODONE-ACETAMINOPHEN 5-325 MG PO TABS: 1.0000 | ORAL_TABLET | ORAL | 0 refills | Status: DC | PRN

## 2016-11-16 NOTE — Discharge Instructions (Signed)
Apply ice packs on/off to your shoulder.  Follow-up with your orthopedic doctor in a few days if the pain is not improving
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
## 2016-11-20 DIAGNOSIS — M25511 Pain in right shoulder: Secondary | ICD-10-CM | POA: Diagnosis not present

## 2016-12-20 ENCOUNTER — Other Ambulatory Visit: Payer: Self-pay | Admitting: Medical

## 2016-12-20 NOTE — Telephone Encounter (Signed)
Is this okay to refill? 

## 2017-01-19 ENCOUNTER — Other Ambulatory Visit: Payer: Self-pay | Admitting: Medical

## 2017-01-29 ENCOUNTER — Ambulatory Visit (INDEPENDENT_AMBULATORY_CARE_PROVIDER_SITE_OTHER): Payer: BLUE CROSS/BLUE SHIELD | Admitting: Medical

## 2017-01-29 VITALS — BP 128/70 | Wt 226.0 lb

## 2017-01-29 DIAGNOSIS — E785 Hyperlipidemia, unspecified: Secondary | ICD-10-CM | POA: Insufficient documentation

## 2017-01-29 DIAGNOSIS — L74519 Primary focal hyperhidrosis, unspecified: Secondary | ICD-10-CM | POA: Diagnosis not present

## 2017-01-29 DIAGNOSIS — M25462 Effusion, left knee: Secondary | ICD-10-CM | POA: Diagnosis not present

## 2017-01-29 DIAGNOSIS — R748 Abnormal levels of other serum enzymes: Secondary | ICD-10-CM | POA: Diagnosis not present

## 2017-01-29 DIAGNOSIS — K760 Fatty (change of) liver, not elsewhere classified: Secondary | ICD-10-CM | POA: Insufficient documentation

## 2017-01-29 DIAGNOSIS — I10 Essential (primary) hypertension: Secondary | ICD-10-CM | POA: Diagnosis not present

## 2017-01-29 DIAGNOSIS — M25562 Pain in left knee: Secondary | ICD-10-CM

## 2017-01-29 DIAGNOSIS — R61 Generalized hyperhidrosis: Secondary | ICD-10-CM

## 2017-01-29 MED ORDER — MIRABEGRON ER 25 MG PO TB24: 25.0000 mg | ORAL_TABLET | Freq: Every day | ORAL | 1 refills | Status: DC

## 2017-01-29 MED ORDER — LISINOPRIL-HYDROCHLOROTHIAZIDE 20-12.5 MG PO TABS: 1.0000 | ORAL_TABLET | Freq: Every day | ORAL | 3 refills | Status: DC

## 2017-01-29 NOTE — Progress Notes (Signed)
Subjective: Chief Complaint  Patient presents with  . med check    med check   Here for med check.   At his last visit late last fall we started him on statin, Aspirin, and he is compliant without medication side effects.  He has been working to eat healthier.  Is active on the job so gets quite a bit of exercise.  He started Oxybutynin last visit due to hyperhidrosis.  It works ok, but not as good as it could be.  Partial knee replacement 08/2016.  Was out of work 12 weeks.   Sees Dr. Percell Miller soon.   Having knee locking up and swelling at times.   Past Medical History:  Diagnosis Date  . Arthritis   . Atypical chest pain 07/06/2015   Non-obstructive CAD on [facility] 04/2010.  [name] Chondromalacia of left knee 09/2012  . Complication of anesthesia   . Family history of cancer    multiple family members, multiple types.   Sees Dr. Marin Olp  . Family history of cancer   . Family history of premature CAD   . H/O cardiovascular stress test 07/14/2015   myocardial perfusion scan, normal, EF 45-54% mildy reduced LV function, Dr. Oval Linsey  . H/O echocardiogram 07/18/2015   LV mild hypertrophy, 55-60%EF, Dr. Oval Linsey  . High cholesterol   . Hypertension   . Obesity   . Overweight   . PONV (postoperative nausea and vomiting)   . Testicular cancer ([place]) 02/2005   Dr. Marin Olp   Current Outpatient Prescriptions on File Prior to Visit  Medication Sig Dispense Refill  . aspirin EC 325 MG tablet Take 1 tablet (325 mg total) by mouth daily. 1 tab a day for the next 30 days to prevent blood clots 30 tablet 0  . cyclobenzaprine (FLEXERIL) 10 MG tablet Take 1 tablet (10 mg total) by mouth 3 (three) times daily as needed. 21 tablet 0  . ibuprofen (ADVIL,MOTRIN) 800 MG tablet Take 1 tablet (800 mg total) by mouth 3 (three) times daily. 21 tablet 0  . naproxen sodium (ALEVE) 220 MG tablet Take 880 mg by mouth once as needed (for pain).    . pravastatin (PRAVACHOL) 20 MG tablet take 1 tablet once daily 30  tablet 0   No current facility-administered medications on file prior to visit.    ROS as in subjective    Objective BP 128/70   Wt 226 lb (102.5 kg)   BMI 33.37 kg/m   Wt Readings from Last 3 Encounters:  01/29/17 226 lb (102.5 kg)  11/16/16 217 lb (98.4 kg)  08/15/16 213 lb (96.6 kg)   General appearance: alert, no distress, WD/WN,  Oral cavity: MMM, no lesions Neck: supple, no lymphadenopathy, no thyromegaly, no masses, no bruits Heart: RRR, normal S1, S2, no murmurs Lungs: CTA bilaterally, no wheezes, rhonchi, or rales Abdomen: +bs, soft, non tender, non distended, no masses, no hepatomegaly, no splenomegaly Pulses: 2+ symmetric, upper and lower extremities, normal cap refill Left knee with generalized mild edema around patella, but no laxity, no other deformity Legs otherwise neurovascularly intact    Assessment: Encounter Diagnoses  Name Primary?  . Essential hypertension Yes  . Hyperhidrosis   . Hyperlipidemia, unspecified hyperlipidemia type   . Elevated liver enzymes   . Fatty liver   . Pain and swelling of left knee     Plan: HTN - c/t same medication hyperhidrosis - begin trial of Mybetriq instead of oxybutynin.  He does see benefit of Oxybutynin, but not quite  the intended improvement Hyperlipidemia - compliant with statin ,Aspirin, f/u within a week for fasting labs Elevated LFT - return for labs as discussed Fatty liver - c/t efforts at healthy diet, exercise, weight loss Knee swelling - f/u with orthopedics  Syrus was seen today for med check.  Diagnoses and all orders for this visit:  Essential hypertension -     Cancel: Hepatic function panel -     Cancel: Lipid panel -     Cancel: Basic metabolic panel -     Cancel: Hemoglobin A1c -     Basic metabolic panel; Future -     Lipid panel; Future -     Hemoglobin A1c; Future -     Hepatic function panel; Future  Hyperhidrosis  Hyperlipidemia, unspecified hyperlipidemia type -     Cancel:  Lipid panel -     Lipid panel; Future  Elevated liver enzymes -     Cancel: Hepatic function panel -     Hepatic function panel; Future  Fatty liver  Pain and swelling of left knee  Other orders -     lisinopril-hydrochlorothiazide (PRINZIDE,ZESTORETIC) 20-12.5 MG tablet; Take 1 tablet by mouth daily. -     mirabegron ER (MYRBETRIQ) 25 MG TB24 tablet; Take 1 tablet (25 mg total) by mouth daily.

## 2017-02-06 ENCOUNTER — Other Ambulatory Visit: Payer: BLUE CROSS/BLUE SHIELD

## 2017-02-06 DIAGNOSIS — R748 Abnormal levels of other serum enzymes: Secondary | ICD-10-CM | POA: Diagnosis not present

## 2017-02-06 DIAGNOSIS — I10 Essential (primary) hypertension: Secondary | ICD-10-CM

## 2017-02-06 DIAGNOSIS — E785 Hyperlipidemia, unspecified: Secondary | ICD-10-CM

## 2017-02-06 LAB — BASIC METABOLIC PANEL
BUN: 17 mg/dL (ref 7–25)
CO2: 25 mmol/L (ref 20–31)
Calcium: 9 mg/dL (ref 8.6–10.3)
Chloride: 100 mmol/L (ref 98–110)
Creat: 1.02 mg/dL (ref 0.60–1.35)
GLUCOSE: 95 mg/dL (ref 65–99)
POTASSIUM: 4.5 mmol/L (ref 3.5–5.3)
SODIUM: 134 mmol/L — AB (ref 135–146)

## 2017-02-06 LAB — LIPID PANEL
CHOL/HDL RATIO: 3.5 ratio (ref <5.0)
Cholesterol: 142 mg/dL (ref <200)
HDL: 41 mg/dL (ref >40)
LDL Cholesterol: 65 mg/dL (ref <100)
Triglycerides: 179 mg/dL — ABNORMAL HIGH (ref <150)
VLDL: 36 mg/dL — AB (ref <30)

## 2017-02-06 LAB — HEPATIC FUNCTION PANEL
ALK PHOS: 94 U/L (ref 40–115)
ALT: 20 U/L (ref 9–46)
AST: 14 U/L (ref 10–40)
Albumin: 4.1 g/dL (ref 3.6–5.1)
BILIRUBIN INDIRECT: 0.7 mg/dL (ref 0.2–1.2)
Bilirubin, Direct: 0.1 mg/dL (ref <=0.2)
Total Bilirubin: 0.8 mg/dL (ref 0.2–1.2)
Total Protein: 6.5 g/dL (ref 6.1–8.1)

## 2017-02-07 LAB — HEMOGLOBIN A1C
HEMOGLOBIN A1C: 5.6 % (ref <5.7)
MEAN PLASMA GLUCOSE: 114 mg/dL

## 2017-02-11 ENCOUNTER — Other Ambulatory Visit: Payer: Self-pay | Admitting: Medical

## 2017-02-11 MED ORDER — PRAVASTATIN SODIUM 20 MG PO TABS: 20.0000 mg | ORAL_TABLET | Freq: Every day | ORAL | 3 refills | Status: DC

## 2017-02-15 ENCOUNTER — Other Ambulatory Visit: Payer: Self-pay | Admitting: Medical

## 2017-03-22 ENCOUNTER — Other Ambulatory Visit: Payer: Self-pay | Admitting: Medical

## 2017-04-15 ENCOUNTER — Other Ambulatory Visit (HOSPITAL_COMMUNITY): Payer: Self-pay | Admitting: Orthopedic Surgery

## 2017-04-15 ENCOUNTER — Ambulatory Visit (HOSPITAL_COMMUNITY)
Admission: RE | Admit: 2017-04-15 | Discharge: 2017-04-15 | Disposition: A | Discharge status: Home / Self Care | Payer: BLUE CROSS/BLUE SHIELD | Source: Ambulatory Visit | Attending: Orthopedic Surgery | Admitting: Orthopedic Surgery

## 2017-04-15 DIAGNOSIS — M7989 Other specified soft tissue disorders: Principal | ICD-10-CM

## 2017-04-15 DIAGNOSIS — M79605 Pain in left leg: Secondary | ICD-10-CM | POA: Insufficient documentation

## 2017-04-15 DIAGNOSIS — M25562 Pain in left knee: Secondary | ICD-10-CM | POA: Diagnosis not present

## 2017-04-15 NOTE — Progress Notes (Signed)
*  PRELIMINARY RESULTS* Vascular Ultrasound Left lower extremity venous duplex has been completed.  Preliminary findings: No evidence of DVT or baker's cyst.    Called results to Dr. Percell Miller.  Landry Mellow, RDMS, RVT  04/15/2017, 1:45 PM

## 2017-04-18 DIAGNOSIS — M25562 Pain in left knee: Secondary | ICD-10-CM | POA: Diagnosis not present

## 2017-04-26 ENCOUNTER — Other Ambulatory Visit: Payer: Self-pay | Admitting: Medical

## 2017-05-28 ENCOUNTER — Other Ambulatory Visit: Payer: Self-pay | Admitting: Medical

## 2017-05-28 ENCOUNTER — Other Ambulatory Visit: Payer: Self-pay

## 2017-05-28 MED ORDER — OXYBUTYNIN CHLORIDE 5 MG PO TABS: 5.0000 mg | ORAL_TABLET | Freq: Two times a day (BID) | ORAL | 0 refills | Status: DC

## 2017-05-28 MED ORDER — ASPIRIN EC 325 MG PO TBEC: 325.0000 mg | DELAYED_RELEASE_TABLET | Freq: Every day | ORAL | 0 refills | Status: DC

## 2017-05-28 MED ORDER — LISINOPRIL-HYDROCHLOROTHIAZIDE 20-12.5 MG PO TABS: 1.0000 | ORAL_TABLET | Freq: Every day | ORAL | 0 refills | Status: DC

## 2017-05-28 MED ORDER — PRAVASTATIN SODIUM 20 MG PO TABS: 20.0000 mg | ORAL_TABLET | Freq: Every day | ORAL | 0 refills | Status: DC

## 2017-05-28 NOTE — Telephone Encounter (Signed)
Pt 's wife called and said that he wants to go back on the oxybutynin 1 tablet  Daily and for a 90 day supply and he is no longer taking myrbertriq and wants to switch back.

## 2017-05-28 NOTE — Addendum Note (Signed)
Addended by: Carlena Hurl on: 05/28/2017 04:25 PM   Modules accepted: Orders

## 2017-06-06 ENCOUNTER — Emergency Department (HOSPITAL_COMMUNITY)
Admission: EM | Admit: 2017-06-06 | Discharge: 2017-06-06 | Disposition: A | Discharge status: Home / Self Care | Payer: BLUE CROSS/BLUE SHIELD | Attending: Emergency Medicine | Admitting: Emergency Medicine

## 2017-06-06 ENCOUNTER — Emergency Department (HOSPITAL_COMMUNITY): Payer: BLUE CROSS/BLUE SHIELD

## 2017-06-06 ENCOUNTER — Encounter (HOSPITAL_COMMUNITY): Payer: Self-pay | Admitting: Emergency Medicine

## 2017-06-06 DIAGNOSIS — Z96652 Presence of left artificial knee joint: Secondary | ICD-10-CM | POA: Diagnosis not present

## 2017-06-06 DIAGNOSIS — I10 Essential (primary) hypertension: Secondary | ICD-10-CM | POA: Diagnosis not present

## 2017-06-06 DIAGNOSIS — R112 Nausea with vomiting, unspecified: Secondary | ICD-10-CM | POA: Diagnosis not present

## 2017-06-06 DIAGNOSIS — R0789 Other chest pain: Secondary | ICD-10-CM

## 2017-06-06 DIAGNOSIS — R61 Generalized hyperhidrosis: Secondary | ICD-10-CM | POA: Insufficient documentation

## 2017-06-06 DIAGNOSIS — J9811 Atelectasis: Secondary | ICD-10-CM | POA: Diagnosis not present

## 2017-06-06 DIAGNOSIS — Z8547 Personal history of malignant neoplasm of testis: Secondary | ICD-10-CM | POA: Insufficient documentation

## 2017-06-06 DIAGNOSIS — R42 Dizziness and giddiness: Secondary | ICD-10-CM | POA: Diagnosis not present

## 2017-06-06 DIAGNOSIS — Z7982 Long term (current) use of aspirin: Secondary | ICD-10-CM | POA: Insufficient documentation

## 2017-06-06 DIAGNOSIS — R0602 Shortness of breath: Secondary | ICD-10-CM | POA: Diagnosis not present

## 2017-06-06 LAB — BASIC METABOLIC PANEL
ANION GAP: 9 (ref 5–15)
BUN: 12 mg/dL (ref 6–20)
CALCIUM: 9.4 mg/dL (ref 8.9–10.3)
CHLORIDE: 104 mmol/L (ref 101–111)
CO2: 23 mmol/L (ref 22–32)
Creatinine, Ser: 1.06 mg/dL (ref 0.61–1.24)
GFR calc Af Amer: >60 mL/min (ref >60)
GFR calc non Af Amer: >60 mL/min (ref >60)
GLUCOSE: 94 mg/dL (ref 65–99)
Potassium: 4.2 mmol/L (ref 3.5–5.1)
Sodium: 136 mmol/L (ref 135–145)

## 2017-06-06 LAB — I-STAT TROPONIN, ED
TROPONIN I, POC: 0 ng/mL (ref 0.00–0.08)
TROPONIN I, POC: 0 ng/mL (ref 0.00–0.08)

## 2017-06-06 LAB — CBC
HCT: 45.5 % (ref 39.0–52.0)
HEMOGLOBIN: 15.7 g/dL (ref 13.0–17.0)
MCH: 30.9 pg (ref 26.0–34.0)
MCHC: 34.5 g/dL (ref 30.0–36.0)
MCV: 89.6 fL (ref 78.0–100.0)
Platelets: 303 10*3/uL (ref 150–400)
RBC: 5.08 MIL/uL (ref 4.22–5.81)
RDW: 12.4 % (ref 11.5–15.5)
WBC: 7.7 10*3/uL (ref 4.0–10.5)

## 2017-06-06 LAB — D-DIMER, QUANTITATIVE: D-Dimer, Quant: <0.27 ug/mL-FEU (ref 0.00–0.50)

## 2017-06-06 MED ORDER — MORPHINE SULFATE (PF) 4 MG/ML IV SOLN
4.0000 mg | Freq: Once | INTRAVENOUS | Status: DC
  Filled 2017-06-06: qty 1

## 2017-06-06 MED ORDER — ONDANSETRON HCL 4 MG/2ML IJ SOLN
4.0000 mg | Freq: Once | INTRAMUSCULAR | Status: DC
  Filled 2017-06-06: qty 2

## 2017-06-06 NOTE — ED Triage Notes (Signed)
Pt to ER for sudden acute onset of chest pressure this morning while at work, states at that time was profusely diaphoretic with shortness of breath. Pt unable to sit still in triage, states "it feels like something is sitting on my chest." hx of HTN and high cholesterol with family hx of early MI.

## 2017-06-06 NOTE — ED Notes (Signed)
Patient transported to X-ray 

## 2017-06-06 NOTE — ED Notes (Signed)
Pt refused morphine and zofran. Stated that he feels better.

## 2017-06-06 NOTE — ED Provider Notes (Signed)
[facility] EMERGENCY DEPARTMENT Provider Note   CSN: [id number] Arrival date & time: 06/06/17  2542     History   Chief Complaint Chief Complaint  Patient presents with  . Chest Pain    HPI Scott Moss is a 41 y.o. male.  HPI   41 year old male with history of hyperlipidemia, testicular cancer s/p orchiectomy, nonobstructive CAD, obesity, hypertension presenting for evaluation of chest pain.  Patient was at work this morning and approximately 2 hours ago he developed an acute onset of pressure in his chest radiates to his left arm and also endorsed feeling nauseous, and vomited twice.  Also reports that his face was flushed and he broke out into a sweat.  Symptoms initially intense, which has since improved to mild discomfort.  He still describes "someone is sitting on my chest" does report some lightheadedness and shortness of breath during this episode.  He report having similar pain like this in the past but never vomited.  He denies any fever, chills, productive cough, hemoptysis, back pain, abdominal pain, focal numbness or weakness.  He did report recent travel including an 8 hours car ride yesterday.  Denies any prior history of PE or DVT, no complaints of leg swelling or calf pain.  He is not a smoker but admits to drinking alcohol at least a few beer every other day.  Does have a strong family history of cardiac disease including father having his first heart attack in his 45s.  He denies any recent heavy lifting, or strenuous activity out of the normal.  Past Medical History:  Diagnosis Date  . Arthritis   . Atypical chest pain 07/06/2015   Non-obstructive CAD on [facility] 04/2010.  [name] Chondromalacia of left knee 09/2012  . Complication of anesthesia   . Family history of cancer    multiple family members, multiple types.   Sees Dr. Marin Olp  . Family history of cancer   . Family history of premature CAD   . H/O cardiovascular stress test 07/14/2015   myocardial  perfusion scan, normal, EF 45-54% mildy reduced LV function, Dr. Oval Linsey  . H/O echocardiogram 07/18/2015   LV mild hypertrophy, 55-60%EF, Dr. Oval Linsey  . High cholesterol   . Hypertension   . Obesity   . Overweight   . PONV (postoperative nausea and vomiting)   . Testicular cancer ([place]) 02/2005   Dr. Marin Olp    Patient Active Problem List   Diagnosis Date Noted  . Hyperlipidemia 01/29/2017  . Elevated liver enzymes 01/29/2017  . Fatty liver 01/29/2017  . Pain and swelling of left knee 01/29/2017  . Encounter for health maintenance examination in adult 04/03/2016  . Anal fissure 04/03/2016  . Family history of premature CAD 04/03/2016  . Family history of cancer 04/03/2016  . Obesity 04/03/2016  . Hyperhidrosis 04/03/2016  . Screening for prostate cancer 04/03/2016  . Influenza vaccination declined 04/03/2016  . Abnormal hearing screen 04/03/2016  . Abnormal tympanic membrane of left ear 04/03/2016  . Atypical chest pain 07/06/2015  . Rectal bleeding 06/20/2015  . Abdominal pain, LLQ 12/02/2011  . H/O testicular cancer 12/02/2011  . Hypertension     Past Surgical History:  Procedure Laterality Date  . ANTERIOR CERVICAL DECOMP/DISCECTOMY FUSION  11/11/2008   C5-6  . APPENDECTOMY    . CARDIAC CATHETERIZATION  04/13/2010   no disease  . CERVICAL SPINE SURGERY  2015   Dr. Deri Fuelling  . COLONOSCOPY     prior, no problems per patient,  year unknown, possibly in early 28s.  [name] KNEE ARTHROSCOPY Right   . KNEE ARTHROSCOPY Left 10/19/2015   Procedure: LEFT KNEE SCOPE CHONDROPLASTY;  Surgeon: [name], MD;  Location: [facility];  Service: Orthopedics;  Laterality: Left;  . KNEE ARTHROSCOPY WITH LATERAL RELEASE Left 09/18/2012   Procedure: KNEE ARTHROSCOPY WITH LATERAL RELEASE;  Surgeon: [name], MD;  Location: [facility];  Service: Orthopedics;  Laterality: Left;  WITH DEBRIDEMENT AND SHAVING(CHONDROPLASTY), Excision Medial [place]  . NASAL  SINUS SURGERY  2014  . PARTIAL KNEE ARTHROPLASTY Left 08/15/2016   Procedure: PATELLA FEMORAL REPLACEMENT LEFT KNEE;  Surgeon: [name], MD;  Location: [facility];  Service: Orthopedics;  Laterality: Left;  . RADICAL ORCHIECTOMY  02/07/2005   left; with left testicular implant       Home Medications    Prior to Admission medications   Medication Sig Start Date End Date Taking? Authorizing Provider  aspirin EC 325 MG tablet Take 1 tablet (325 mg total) by mouth daily. 1 tab a day for the next 30 days to prevent blood clots 05/28/17   Tysinger, Camelia Eng, PA-C  cyclobenzaprine (FLEXERIL) 10 MG tablet Take 1 tablet (10 mg total) by mouth 3 (three) times daily as needed. 11/16/16   Triplett, Tammy, PA-C  ibuprofen (ADVIL,MOTRIN) 800 MG tablet Take 1 tablet (800 mg total) by mouth 3 (three) times daily. 11/16/16   Triplett, Tammy, PA-C  lisinopril-hydrochlorothiazide (PRINZIDE,ZESTORETIC) 20-12.5 MG tablet take 1 tablet by mouth once daily 05/28/17   Tysinger, Camelia Eng, PA-C  naproxen sodium (ALEVE) 220 MG tablet Take 880 mg by mouth once as needed (for pain).    [provider]  oxybutynin (DITROPAN) 5 MG tablet Take 1 tablet (5 mg total) by mouth 2 (two) times daily. 05/28/17   Tysinger, Camelia Eng, PA-C  pravastatin (PRAVACHOL) 20 MG tablet take 1 tablet once daily 05/28/17   Tysinger, Camelia Eng, PA-C  RA ASPIRIN EC ADULT LOW ST 81 MG EC tablet take 1 tablet by mouth once daily 05/28/17   Tysinger, Camelia Eng, PA-C    Family History Family History  Problem Relation Age of Onset  . Ovarian cancer Mother        uterine, mets  . Hypertension Mother   . Stroke Mother   . Migraines Mother   . Cancer Mother   . Lung cancer Father   . Hypertension Father   . Heart attack Father 8  . Skin cancer Father        ?  [name] Heart disease Father   . Cancer Father        lung  . Cancer Sister 22       multiple myeloma  . Hypertension Sister   . Migraines Sister   . Diabetes  Maternal Uncle   . Colon cancer Maternal Uncle     Social History Social History  Substance Use Topics  . Smoking status: Never Smoker  . Smokeless tobacco: Never Used     Comment: never used tobacco  . Alcohol use 1.8 oz/week    3 Cans of beer per week     Comment: social     Allergies   Benicar [olmesartan] and Nexium [esomeprazole magnesium]   Review of Systems Review of Systems   Physical Exam Updated Vital Signs BP 126/84 (BP Location: Right Arm)   Pulse 93   Resp 18   Ht 5' 9" (1.753 m)   Wt 99.8 kg (  220 lb)   SpO2 98%   BMI 32.49 kg/m   Physical Exam  Constitutional: He appears well-developed and well-nourished. No distress.  HENT:  Head: Atraumatic.  Eyes: Conjunctivae are normal.  Neck: Neck supple. No JVD present.  Cardiovascular: Normal rate, regular rhythm and intact distal pulses.  Exam reveals no gallop and no friction rub.   No murmur heard. Pulmonary/Chest: Effort normal and breath sounds normal. He exhibits no tenderness.  Abdominal: Soft. Bowel sounds are normal. He exhibits no distension.  Musculoskeletal: He exhibits no edema.  Neurological: He is alert.  Skin: No rash noted.  Psychiatric: He has a normal mood and affect.  Nursing note and vitals reviewed.    ED Treatments / Results  Labs (all labs ordered are listed, but only abnormal results are displayed) Labs Reviewed  BASIC METABOLIC PANEL  CBC  D-DIMER, QUANTITATIVE (NOT AT [facility])  I-STAT TROPONIN, ED  I-STAT TROPONIN, ED    EKG  EKG Interpretation  Date/Time:  Friday June 06 2017 09:40:21 EDT Ventricular Rate:  101 PR Interval:  154 QRS Duration: 90 QT Interval:  332 QTC Calculation: 430 R Axis:   58 Text Interpretation:  Sinus tachycardia Otherwise normal ECG When comapred to prior, no significant changes seen.  No STEMI Confirmed by Antony Blackbird [phone]) on 06/06/2017 12:02:42 PM      Date: 06/06/2017  Rate: 101  Rhythm: sinus tachycardia  QRS Axis:  normal  Intervals: normal  ST/T Wave abnormalities: normal  Conduction Disutrbances: none  Narrative Interpretation:   Old EKG Reviewed: No significant changes noted     Radiology Dg Chest 2 View  Result Date: 06/06/2017 CLINICAL DATA:  Mid chest pressure EXAM: CHEST  2 VIEW COMPARISON:  11/16/2016 FINDINGS: Minimal bibasilar atelectasis. Low lung volumes. Heart is normal size. No effusions or acute bony abnormality. IMPRESSION: Minimal bibasilar atelectasis. Electronically Signed   By: Rolm Baptise M.D.   On: 06/06/2017 10:52    Procedures Procedures (including critical care time)  Medications Ordered in ED Medications  morphine 4 MG/ML injection 4 mg (4 mg Intravenous Refused 06/06/17 1221)  ondansetron (ZOFRAN) injection 4 mg (4 mg Intravenous Refused 06/06/17 1221)     Initial Impression / Assessment and Plan / ED Course  I have reviewed the triage vital signs and the nursing notes.  Pertinent labs & imaging results that were available during my care of the patient were reviewed by me and considered in my medical decision making (see chart for details).     BP 116/72   Pulse 85   Resp 18   Ht 5' 9" (1.753 m)   Wt 99.8 kg (220 lb)   SpO2 95%   BMI 32.49 kg/m    Final Clinical Impressions(s) / ED Diagnoses   Final diagnoses:  Atypical chest pain    New Prescriptions New Prescriptions   No medications on file   10:36 AM Per prior note, patient has had several years of chest pain and shortness of breath.  He had a cardiac catheterization 2011 that reveals no obstructive coronary disease.  His last cardiac stress test was December 2016 without any significant stenosis.  He does have cardiac risk factors therefore workup initiated.  His initial EKG and troponins are normal.  He did travel recently therefore a d-dimer ordered to assess for potential PE.  The patient is doing dorsum pain, morphine and Zofran given.  Will monitor closely.    1:47 PM Patient has a  negative d-dimer, and a normal  serial troponin.  He is symptom-free.  He feels comfortable going home.  Will follow up promptly with his primary care provider for further care.  Return precautions discussed.   Domenic Moras, PA-C 06/06/17 1348    Tegeler, Gwenyth Allegra, MD 06/06/17 [phone]

## 2017-06-11 ENCOUNTER — Encounter: Payer: Self-pay | Admitting: Medical

## 2017-06-11 ENCOUNTER — Ambulatory Visit: Payer: BLUE CROSS/BLUE SHIELD | Admitting: Medical

## 2017-06-11 VITALS — BP 122/70 | HR 82 | Temp 98.9°F | Wt 220.6 lb

## 2017-06-11 DIAGNOSIS — R5383 Other fatigue: Secondary | ICD-10-CM | POA: Insufficient documentation

## 2017-06-11 DIAGNOSIS — R1110 Vomiting, unspecified: Secondary | ICD-10-CM

## 2017-06-11 DIAGNOSIS — R109 Unspecified abdominal pain: Secondary | ICD-10-CM

## 2017-06-11 DIAGNOSIS — R1030 Lower abdominal pain, unspecified: Secondary | ICD-10-CM | POA: Insufficient documentation

## 2017-06-11 DIAGNOSIS — R61 Generalized hyperhidrosis: Secondary | ICD-10-CM

## 2017-06-11 DIAGNOSIS — R079 Chest pain, unspecified: Secondary | ICD-10-CM

## 2017-06-11 MED ORDER — OXYBUTYNIN CHLORIDE 5 MG PO TABS: 5.0000 mg | ORAL_TABLET | Freq: Every day | ORAL | 3 refills | Status: DC

## 2017-06-11 MED ORDER — DEXLANSOPRAZOLE 60 MG PO CPDR: 60.0000 mg | DELAYED_RELEASE_CAPSULE | Freq: Every day | ORAL | 0 refills | Status: DC

## 2017-06-11 NOTE — Progress Notes (Signed)
Subjective: Chief Complaint  Patient presents with  . Hospitalization Follow-up    hosiptal follow, nausea every time he eating, burn pain in side sick to stomach    Here for ED f/u.  Was seen in the emergency dept 06/06/17 for chest pain.  Had eval including CXR, EKG, labs, and no cardiopulmonary etiology found.    He notes that they told him to f/u here for recheck on symptoms and chronic fatigue.  Was advised to have TSH, TST labs.  He notes he still gets a burning pain in chest and right abdomen.   Pain sometimes goes up to throat.    Pain in lower right can be stabbing.  He does get heartburn.   Has had 2 episodes lately on different days where he vomited within seconds to a minute of eating.  Once was with chicken, once with drinking a beverage.   He is taking OTC Nexium with some relief.  He does eat spicy and acidic foods.   No SOB, no edema  He does report chronic fatigue.   Denies snoring, witnessed apnea.  Does fine with sweats on the oxybutynin. It is helping.  Taking once daily  Past Medical History:  Diagnosis Date  . Arthritis   . Atypical chest pain 07/06/2015   Non-obstructive CAD on [facility] 04/2010.  [name] Chondromalacia of left knee 09/2012  . Complication of anesthesia   . Family history of cancer    multiple family members, multiple types.   Sees Dr. Marin Olp  . Family history of cancer   . Family history of premature CAD   . H/O cardiovascular stress test 07/14/2015   myocardial perfusion scan, normal, EF 45-54% mildy reduced LV function, Dr. Oval Linsey  . H/O echocardiogram 07/18/2015   LV mild hypertrophy, 55-60%EF, Dr. Oval Linsey  . High cholesterol   . Hypertension   . Obesity   . Overweight   . PONV (postoperative nausea and vomiting)   . Testicular cancer ([place]) 02/2005   Dr. Marin Olp   Current Outpatient Medications on File Prior to Visit  Medication Sig Dispense Refill  . lisinopril-hydrochlorothiazide (PRINZIDE,ZESTORETIC) 20-12.5 MG tablet take 1 tablet by mouth  once daily 90 tablet 3  . pravastatin (PRAVACHOL) 20 MG tablet take 1 tablet once daily 90 tablet 1  . RA ASPIRIN EC ADULT LOW ST 81 MG EC tablet take 1 tablet by mouth once daily 90 tablet 2   No current facility-administered medications on file prior to visit.    Past Surgical History:  Procedure Laterality Date  . ANTERIOR CERVICAL DECOMP/DISCECTOMY FUSION  11/11/2008   C5-6  . APPENDECTOMY    . CARDIAC CATHETERIZATION  04/13/2010   no disease  . CERVICAL SPINE SURGERY  2015   Dr. Deri Fuelling  . COLONOSCOPY     prior, no problems per patient, year unknown, possibly in early 64s.  [name] KNEE ARTHROSCOPY Right   . NASAL SINUS SURGERY  2014  . RADICAL ORCHIECTOMY  02/07/2005   left; with left testicular implant   ROS as in subjective   Objective: BP 122/70   Pulse 82   Temp 98.9 F (37.2 C)   Wt 220 lb 9.6 oz (100.1 kg)   SpO2 97%   BMI 32.58 kg/m   General appearance: alert, no distress, WD/WN,  HEENT: normocephalic, sclerae anicteric, TMs pearly, nares patent, no discharge or erythema, pharynx normal Oral cavity: small airway, MMM, no lesions Neck: supple, no lymphadenopathy, no thyromegaly, no masses Heart: RRR, normal S1, S2, no  murmurs Lungs: CTA bilaterally, no wheezes, rhonchi, or rales Abdomen: +bs, soft, mild epigastric and RUQ tenderness, otherwise  non tender, non distended, no masses, no hepatomegaly, no splenomegaly Pulses: 2+ symmetric, upper and lower extremities, normal cap refill      Assessment: Encounter Diagnoses  Name Primary?  . Chest pain, unspecified type Yes  . Other fatigue   . Abdominal pain, unspecified abdominal location   . Vomiting, intractability of vomiting not specified, presence of nausea not specified, unspecified vomiting type   . Hyperhidrosis      Plan: Reviewed 06/06/17 ED report, labs, EKG, CXR.   Cardiopulmonary cause seems to be ruled out.  Begin samples of Dexilant, avoid GERD triggers.    If not much improved within the  next 1-2 weeks, then plan referral to GI  hyperhidrosis - doing fine on oxybutynin qd  Fatigue - labs today, consider sleep study  Ji was seen today for hospitalization follow-up.  Diagnoses and all orders for this visit:  Chest pain, unspecified type  Other fatigue -     TSH -     Testosterone  Abdominal pain, unspecified abdominal location  Vomiting, intractability of vomiting not specified, presence of nausea not specified, unspecified vomiting type  Hyperhidrosis  Other orders -     dexlansoprazole (DEXILANT) 60 MG capsule; Take 1 capsule (60 mg total) daily by mouth. -     oxybutynin (DITROPAN) 5 MG tablet; Take 1 tablet (5 mg total) daily by mouth.

## 2017-06-12 LAB — TSH: TSH: 0.61 mIU/L (ref 0.40–4.50)

## 2017-06-12 LAB — TESTOSTERONE: Testosterone: 303 ng/dL (ref 250–827)

## 2017-07-15 ENCOUNTER — Encounter: Payer: Self-pay | Admitting: Medical

## 2017-09-10 DIAGNOSIS — D225 Melanocytic nevi of trunk: Secondary | ICD-10-CM | POA: Diagnosis not present

## 2017-10-09 DIAGNOSIS — F321 Major depressive disorder, single episode, moderate: Secondary | ICD-10-CM | POA: Diagnosis not present

## 2017-10-09 DIAGNOSIS — F419 Anxiety disorder, unspecified: Secondary | ICD-10-CM | POA: Diagnosis not present

## 2017-10-09 DIAGNOSIS — R450 Nervousness: Secondary | ICD-10-CM | POA: Diagnosis not present

## 2017-10-09 DIAGNOSIS — Z818 Family history of other mental and behavioral disorders: Secondary | ICD-10-CM | POA: Diagnosis not present

## 2017-10-16 ENCOUNTER — Encounter: Payer: Self-pay | Admitting: Family Medicine

## 2017-10-16 ENCOUNTER — Ambulatory Visit: Payer: BLUE CROSS/BLUE SHIELD | Admitting: Family Medicine

## 2017-10-16 VITALS — BP 120/70 | HR 86 | Temp 98.0°F | Ht 69.0 in | Wt 230.4 lb

## 2017-10-16 DIAGNOSIS — H6503 Acute serous otitis media, bilateral: Secondary | ICD-10-CM | POA: Diagnosis not present

## 2017-10-16 DIAGNOSIS — J0100 Acute maxillary sinusitis, unspecified: Secondary | ICD-10-CM

## 2017-10-16 MED ORDER — AMOXICILLIN-POT CLAVULANATE 875-125 MG PO TABS: 1.0000 | ORAL_TABLET | Freq: Two times a day (BID) | ORAL | 0 refills | Status: DC

## 2017-10-16 NOTE — Patient Instructions (Signed)
Take all the antibiotic and if not totally back to normal when you finish call me especially with your hearing

## 2017-10-16 NOTE — Progress Notes (Signed)
   Subjective:    Patient ID: Scott Moss, male    DOB: [birth year], 42 y.o.   MRN: [id number]  HPI He complains of a 2-week history of started with headache, earache, sore throat, PND with some chills.  This has continued and he is now having difficulty with chest congestion, purulent postnasal drainage, coughing, malaise, myalgias.  He does not smoke and has no underlying allergies.  OTC meds have not been useful.   Review of Systems     Objective:   Physical Exam Alert and in no distress.  Nasal mucosa is red with tenderness over all sinuses tympanic membranes are both retracted with fluid behind them canals are normal. Pharyngeal area is normal. Neck is supple without adenopathy or thyromegaly. Cardiac exam shows a regular sinus rhythm without murmurs or gallops. Lungs are clear to auscultation.        Assessment & Plan:  Acute non-recurrent maxillary sinusitis - Plan: amoxicillin-clavulanate (AUGMENTIN) 875-125 MG tablet  Bilateral acute serous otitis media, recurrence not specified Cautioned him to take all the antibiotic and if not totally back to normal when he finishes especially with his hearing, return here.

## 2017-11-04 ENCOUNTER — Encounter: Payer: Self-pay | Admitting: Medical

## 2017-11-04 ENCOUNTER — Ambulatory Visit: Payer: BLUE CROSS/BLUE SHIELD | Admitting: Medical

## 2017-11-04 VITALS — BP 124/80 | HR 85 | Ht 69.0 in | Wt 223.8 lb

## 2017-11-04 DIAGNOSIS — M25462 Effusion, left knee: Secondary | ICD-10-CM

## 2017-11-04 DIAGNOSIS — M25562 Pain in left knee: Secondary | ICD-10-CM | POA: Diagnosis not present

## 2017-11-04 MED ORDER — TRIAMCINOLONE ACETONIDE 40 MG/ML IJ SUSP
40.0000 mg | Freq: Once | INTRAMUSCULAR | Status: AC
  Administered 2017-11-04: 40 mg via INTRAMUSCULAR

## 2017-11-04 MED ORDER — LIDOCAINE HCL 2 % IJ SOLN
3.0000 mL | Freq: Once | INTRAMUSCULAR | Status: AC
  Administered 2017-11-04: 60 mg via INTRADERMAL

## 2017-11-04 MED ORDER — HYDROCODONE-ACETAMINOPHEN 7.5-325 MG PO TABS: 1.0000 | ORAL_TABLET | Freq: Four times a day (QID) | ORAL | 0 refills | Status: AC | PRN

## 2017-11-04 MED ORDER — LIDOCAINE HCL (PF) 1 % IJ SOLN: 3.0000 mL | Freq: Once | INTRAMUSCULAR | Status: DC

## 2017-11-04 NOTE — Progress Notes (Signed)
Subjective:    Scott Moss is a 42 y.o. male who presents with left knee pain and swelling involving the left knee. Onset was gradual, starting about 1 week ago. Inciting event: possibly doing a lot of gear shifting and braking while in heavy traffic in [place] last week. Current symptoms include: pain located all over left knee and swelling. Pain is aggravated by standing and walking. Patient has had prior knee problems, had left knee surgery last year by [facility].  Treatment to date: OTC analgesics which are not very effective and rest.  The following portions of the patient's history were reviewed and updated as appropriate: allergies, current medications, past family history, past medical history, past social history, past surgical history and problem list.   Review of Systems No fever, no weakness, no redness, no other injury or trauma or fall  Objective:    BP 124/80 (BP Location: Right Arm, Patient Position: Sitting, Cuff Size: Normal)   Pulse 85   Ht 5\' 9"  (1.753 m)   Wt 223 lb 12.8 oz (101.5 kg)   SpO2 97%   BMI 33.05 kg/m   Gen: wd, wn, nad, afebrile In moderate pain of left knee Right knee: normal, no effusion, full active range of motion, no joint line tenderness, ligamentous structures intact. and anterior small surgical scar  Left knee:  positive exam findings: effusion, medial joint line tenderness and ROM limited to approximately 40 degrees and negative exam findings: no erythema, ACL stable, PCL stable, MCL stable, LCL stable, no patellar laxity and McMurray's negative    Assessment:   Encounter Diagnoses  Name Primary?  . Knee effusion, left Yes  . Acute pain of left knee      Plan:   Discussed findings, possible etiology.  Gave options including aspiration, RICE, NSAIDs.  He wants to try aspiration along with supportive measures.    Discussed risk and benefits of procedure for knee aspiration and possible steroid injection.  Patient  gives consent.  Cleaned and prepped area in usual sterile fashion.   Used 1 cc of 2% lidocaine for local anesthesia over injection site.  Used 20 cc syringe and 20 gauge needle from lateral approach to aspirate fluid from the left knee.   Aspirated 20 cc of clear straw colored fluid.  Injected the knee with 40 mg of Kenalog in 3 cc of 1% Lidocaine with a 22-gauge 1.5 inch needle (10cc syringe).  Estimated blood loss less than 1 cc, patient tolerated procedure well.  Discussed aftercare.     Patient Instructions  Recommendations:  For the next 3-5 days, REST Elevated the leg when possible and use ice water pack for 20 minutes at least twice daily  You can continue Aleve, 2 tablets twice daily the next 3-5 days  You can use the Hydrocodone pain medications as needed for worse pain  Do not use Ibuprofen and Aleve at the same time/same day  You can use knee sleeve or ACE wrap for compression  We will call with fluid analysis results  Follow up in a week, sooner if needed  If signs of infection such as fever, redness, warmth, worse pain, then recheck immediately    Scott Moss was seen today for acute visit.  Diagnoses and all orders for this visit:  Knee effusion, left -     Body fluid culture -     Body Fluid Crystal -     Synovial fluid, cell count -     Discontinue: lidocaine (  PF) (XYLOCAINE) 1 % injection 3 mL -     triamcinolone acetonide (KENALOG-40) injection 40 mg -     lidocaine (XYLOCAINE) 2 % (with pres) injection 60 mg  Acute pain of left knee -     Body fluid culture -     Body Fluid Crystal -     Synovial fluid, cell count  Other orders -     HYDROcodone-acetaminophen (NORCO) 7.5-325 MG tablet; Take 1 tablet by mouth every 6 (six) hours as needed for up to 5 days for moderate pain.   F/u 1wk

## 2017-11-04 NOTE — Patient Instructions (Signed)
Recommendations:  For the next 3-5 days, REST Elevated the leg when possible and use ice water pack for 20 minutes at least twice daily  You can continue Aleve, 2 tablets twice daily the next 3-5 days  You can use the Hydrocodone pain medications as needed for worse pain  Do not use Ibuprofen and Aleve at the same time/same day  You can use knee sleeve or ACE wrap for compression  We will call with fluid analysis results  Follow up in a week, sooner if needed  If signs of infection such as fever, redness, warmth, worse pain, then recheck immediately

## 2017-11-06 ENCOUNTER — Telehealth: Payer: Self-pay

## 2017-11-06 ENCOUNTER — Telehealth: Payer: Self-pay | Admitting: Medical

## 2017-11-06 NOTE — Telephone Encounter (Signed)
Wrote letter, called pt and advised that letter is ready. Pt said he also need the job description that he gave [place] today signed and dated by Audelia Acton as well as part of the verification that is required by his job

## 2017-11-06 NOTE — Telephone Encounter (Signed)
How is he doing, 50% improved, 80% improved, not improved?  Is he ready to return Monday?

## 2017-11-06 NOTE — Telephone Encounter (Signed)
Tammy, can you write a work note releasing him back to work Monday without restriction.  See msg below

## 2017-11-06 NOTE — Telephone Encounter (Signed)
Pt called and states that he needs a paper signed stating that he can return to work on Monday April the 8th, he sent over a job description and states that his job is requiring this before they will let him return to work, even though we wrote him a note, ,they want to make sure he can perform all of his job responsibilities, states if you will sign and date it today that would be great, pt can be reached at [phone] put in your folder to be signed

## 2017-11-06 NOTE — Telephone Encounter (Signed)
Called patient and sent note over the [facility] regarding how he was feeling after knee procedure

## 2017-11-06 NOTE — Telephone Encounter (Signed)
Patient stated he said that he is about 80% improved, states he has no swelling after the fluid was taken off, and he is ready to return to work on Monday.

## 2017-11-07 NOTE — Telephone Encounter (Signed)
See signed note

## 2017-11-08 LAB — BODY FLUID CULTURE

## 2017-11-08 LAB — SYNOVIAL FLUID, CELL COUNT
EOS FL: 0 %
LINING CELLS, SYNOVIAL: 0 %
Lymphs, Fluid: 52 %
MACROPHAGES FLD: 45 %
Nuc cell # Fld: 524 cells/uL — ABNORMAL HIGH (ref 0–200)
POLYS FL: 3 %

## 2017-11-10 ENCOUNTER — Telehealth: Payer: Self-pay

## 2017-11-10 NOTE — Telephone Encounter (Signed)
-----   Message from Carlena Hurl, PA-C sent at 11/10/2017  7:44 AM EDT ----- No gout or infection on fluid analysis.   Normal fluid findings.   See how his knee is doing?  This suggests it was all just inflammation

## 2017-11-10 NOTE — Telephone Encounter (Signed)
Called patient and gave lab results. Patient had no questions or concerns.  

## 2017-11-13 DIAGNOSIS — F321 Major depressive disorder, single episode, moderate: Secondary | ICD-10-CM | POA: Diagnosis not present

## 2017-11-13 DIAGNOSIS — Z818 Family history of other mental and behavioral disorders: Secondary | ICD-10-CM | POA: Diagnosis not present

## 2017-11-13 DIAGNOSIS — Z658 Other specified problems related to psychosocial circumstances: Secondary | ICD-10-CM | POA: Diagnosis not present

## 2017-11-24 ENCOUNTER — Other Ambulatory Visit: Payer: Self-pay | Admitting: Medical

## 2017-12-25 DIAGNOSIS — L905 Scar conditions and fibrosis of skin: Secondary | ICD-10-CM | POA: Diagnosis not present

## 2018-01-30 ENCOUNTER — Ambulatory Visit: Payer: BLUE CROSS/BLUE SHIELD | Admitting: Medical

## 2018-01-30 VITALS — BP 120/80 | HR 99 | Temp 98.0°F | Resp 16 | Ht 70.0 in | Wt 226.8 lb

## 2018-01-30 DIAGNOSIS — H9192 Unspecified hearing loss, left ear: Secondary | ICD-10-CM

## 2018-01-30 DIAGNOSIS — Z01118 Encounter for examination of ears and hearing with other abnormal findings: Secondary | ICD-10-CM

## 2018-01-30 MED ORDER — AMOXICILLIN 875 MG PO TABS: 875.0000 mg | ORAL_TABLET | Freq: Two times a day (BID) | ORAL | 0 refills | Status: DC

## 2018-01-30 NOTE — Progress Notes (Signed)
Subjective:  ZOEY GILKESON is a 42 y.o. male who presents for Chief Complaint  Patient presents with  . ear    cant hear left ear X 2 weeks     Here for complaint of decreased hearing in the left ear.  Has felt that way for the past several days.  No cough no runny nose no congestion no fever no sore throat no dizziness no headache.  Wife use peroxide in his ears to see if that would help but it did not.  No other aggravating or relieving factors.    No other c/o.  The following portions of the patient's history were reviewed and updated as appropriate: allergies, current medications, past family history, past medical history, past social history, past surgical history and problem list.  ROS Otherwise as in subjective above  Objective: BP 120/80   Pulse 99   Temp 98 F (36.7 C) (Oral)   Resp 16   Ht 5\' 10"  (1.778 m)   Wt 226 lb 12.8 oz (102.9 kg)   SpO2 96%   BMI 32.54 kg/m   General appearance: alert, no distress, well developed, well nourished HEENT: normocephalic, sclerae anicteric, conjunctiva pink and moist, bilateral TMs with somewhat retracted appearance, some possible fluid pockets behind each eardrum with a somewhat yellowish abnormal normal-appearing area of the left eardrum,, nares patent, no discharge or erythema, pharynx normal Oral cavity: MMM, no lesions Neck: supple, no lymphadenopathy, no thyromegaly, no masses    Assessment: Encounter Diagnoses  Name Primary?  . Decreased hearing of left ear Yes  . Abnormal exam of both ears      Plan: We discussed his exam findings that are unclear in etiology.  Likely serous otitis media but there is some question of possible cholesteoma.  Begin round of amoxicillin, good hydration, Benadryl nightly and if much improved within a week then we will be reassured.  If no improvement at all within a week then refer to ear nose and throat to rule out Cholesteoma  Mikkel was seen today for ear.  Diagnoses and all orders for  this visit:  Decreased hearing of left ear  Abnormal exam of both ears  Other orders -     amoxicillin (AMOXIL) 875 MG tablet; Take 1 tablet (875 mg total) by mouth 2 (two) times daily.    Follow up: 10 days

## 2018-02-06 ENCOUNTER — Telehealth: Payer: Self-pay

## 2018-02-06 NOTE — Telephone Encounter (Signed)
Pt called stating that he still cannot hear out of his left ear. He stated he was told to call back and inform his provider. Please advise

## 2018-02-08 NOTE — Telephone Encounter (Signed)
Did he see any improvement on the antibiotic?  Is he worse, improved at all?

## 2018-02-09 ENCOUNTER — Other Ambulatory Visit: Payer: Self-pay | Admitting: Medical

## 2018-02-09 ENCOUNTER — Telehealth: Payer: Self-pay

## 2018-02-09 MED ORDER — CEFUROXIME AXETIL 500 MG PO TABS
500.0000 mg | ORAL_TABLET | Freq: Two times a day (BID) | ORAL | 0 refills | Status: AC
Start: 2018-02-09 — End: 2018-02-19

## 2018-02-09 MED ORDER — PREDNISONE 10 MG PO TABS: ORAL_TABLET | ORAL | 0 refills | Status: DC

## 2018-02-09 NOTE — Telephone Encounter (Signed)
May need to call in the 2 medications, as I sent to local pharmacy before I saw this message.

## 2018-02-09 NOTE — Telephone Encounter (Signed)
Left message on voicemail for patient to call back. 

## 2018-02-09 NOTE — Telephone Encounter (Signed)
I sent a round of Ceftin antibiotic along with prednisone and steroids both oral medications.  If not much improved by Thursday or Friday of this week then we will need to have him see ear nose and throat specialist  Have him begin these 2 medications right away

## 2018-02-09 NOTE — Telephone Encounter (Signed)
Pt stated there was no improvement and he's out of town working and won't be back until late Thursday. Stated he went to ENT before for nose surgery with DR. Baits. Please advise further.

## 2018-02-09 NOTE — Telephone Encounter (Signed)
Called patient and he states he does not know anywhere up there and he still has antibiotics from last visit.  He will just wait until he comes back in town to pick up the new RX

## 2018-02-09 NOTE — Telephone Encounter (Signed)
Patient states that no improvement with antibiotic.  Everything is about the same no improvement at all

## 2018-02-09 NOTE — Telephone Encounter (Signed)
Patient states that he is out of town working in [place] until Thursday and said he will pick up medication when he gets back in town.

## 2018-02-09 NOTE — Telephone Encounter (Signed)
Patient states that he will pick up medications on Thursday when he gets back in town.

## 2018-02-09 NOTE — Telephone Encounter (Signed)
We did offer to call out to his location correct?

## 2018-02-19 ENCOUNTER — Other Ambulatory Visit: Payer: Self-pay | Admitting: Medical

## 2018-03-05 ENCOUNTER — Other Ambulatory Visit: Payer: Self-pay

## 2018-03-05 ENCOUNTER — Encounter (HOSPITAL_COMMUNITY): Payer: Self-pay

## 2018-03-05 ENCOUNTER — Emergency Department (HOSPITAL_COMMUNITY): Payer: BLUE CROSS/BLUE SHIELD

## 2018-03-05 ENCOUNTER — Emergency Department (HOSPITAL_COMMUNITY)
Admission: EM | Admit: 2018-03-05 | Discharge: 2018-03-05 | Disposition: A | Discharge status: Home / Self Care | Payer: BLUE CROSS/BLUE SHIELD | Attending: Emergency Medicine | Admitting: Emergency Medicine

## 2018-03-05 DIAGNOSIS — R1011 Right upper quadrant pain: Secondary | ICD-10-CM | POA: Insufficient documentation

## 2018-03-05 DIAGNOSIS — I10 Essential (primary) hypertension: Secondary | ICD-10-CM | POA: Insufficient documentation

## 2018-03-05 DIAGNOSIS — Z7982 Long term (current) use of aspirin: Secondary | ICD-10-CM | POA: Insufficient documentation

## 2018-03-05 DIAGNOSIS — K5900 Constipation, unspecified: Secondary | ICD-10-CM

## 2018-03-05 DIAGNOSIS — Z79899 Other long term (current) drug therapy: Secondary | ICD-10-CM | POA: Insufficient documentation

## 2018-03-05 DIAGNOSIS — E860 Dehydration: Secondary | ICD-10-CM | POA: Diagnosis not present

## 2018-03-05 DIAGNOSIS — Z8547 Personal history of malignant neoplasm of testis: Secondary | ICD-10-CM | POA: Diagnosis not present

## 2018-03-05 DIAGNOSIS — K760 Fatty (change of) liver, not elsewhere classified: Secondary | ICD-10-CM | POA: Diagnosis not present

## 2018-03-05 DIAGNOSIS — E7800 Pure hypercholesterolemia, unspecified: Secondary | ICD-10-CM | POA: Insufficient documentation

## 2018-03-05 LAB — URINALYSIS, ROUTINE W REFLEX MICROSCOPIC
BILIRUBIN URINE: NEGATIVE
Glucose, UA: NEGATIVE mg/dL
HGB URINE DIPSTICK: NEGATIVE
Ketones, ur: NEGATIVE mg/dL
Leukocytes, UA: NEGATIVE
Nitrite: NEGATIVE
PH: 5 (ref 5.0–8.0)
Protein, ur: NEGATIVE mg/dL

## 2018-03-05 LAB — CBC WITH DIFFERENTIAL/PLATELET
Basophils Absolute: 0 10*3/uL (ref 0.0–0.1)
Basophils Relative: 1 %
EOS ABS: 0.3 10*3/uL (ref 0.0–0.7)
Eosinophils Relative: 4 %
HEMATOCRIT: 46.6 % (ref 39.0–52.0)
HEMOGLOBIN: 16 g/dL (ref 13.0–17.0)
LYMPHS ABS: 2 10*3/uL (ref 0.7–4.0)
LYMPHS PCT: 28 %
MCH: 31.3 pg (ref 26.0–34.0)
MCHC: 34.3 g/dL (ref 30.0–36.0)
MCV: 91 fL (ref 78.0–100.0)
MONOS PCT: 15 %
Monocytes Absolute: 1 10*3/uL (ref 0.1–1.0)
NEUTROS ABS: 3.8 10*3/uL (ref 1.7–7.7)
Neutrophils Relative %: 54 %
Platelets: 270 10*3/uL (ref 150–400)
RBC: 5.12 MIL/uL (ref 4.22–5.81)
RDW: 12.5 % (ref 11.5–15.5)
WBC: 7.1 10*3/uL (ref 4.0–10.5)

## 2018-03-05 LAB — COMPREHENSIVE METABOLIC PANEL
ALT: 40 U/L (ref 0–44)
ANION GAP: 7 (ref 5–15)
AST: 24 U/L (ref 15–41)
Albumin: 4 g/dL (ref 3.5–5.0)
Alkaline Phosphatase: 72 U/L (ref 38–126)
BUN: 16 mg/dL (ref 6–20)
CHLORIDE: 106 mmol/L (ref 98–111)
CO2: 23 mmol/L (ref 22–32)
Calcium: 8.6 mg/dL — ABNORMAL LOW (ref 8.9–10.3)
Creatinine, Ser: 1.06 mg/dL (ref 0.61–1.24)
Glucose, Bld: 111 mg/dL — ABNORMAL HIGH (ref 70–99)
Potassium: 4.2 mmol/L (ref 3.5–5.1)
SODIUM: 136 mmol/L (ref 135–145)
Total Bilirubin: 0.7 mg/dL (ref 0.3–1.2)
Total Protein: 7 g/dL (ref 6.5–8.1)

## 2018-03-05 LAB — RAPID URINE DRUG SCREEN, HOSP PERFORMED
Amphetamines: NOT DETECTED
BARBITURATES: NOT DETECTED
Benzodiazepines: NOT DETECTED
Cocaine: NOT DETECTED
Opiates: NOT DETECTED
TETRAHYDROCANNABINOL: NOT DETECTED

## 2018-03-05 LAB — D-DIMER, QUANTITATIVE: D-Dimer, Quant: <0.27 ug/mL-FEU (ref 0.00–0.50)

## 2018-03-05 LAB — ETHANOL: Alcohol, Ethyl (B): <10 mg/dL (ref <10)

## 2018-03-05 LAB — LIPASE, BLOOD: LIPASE: 46 U/L (ref 11–51)

## 2018-03-05 MED ORDER — SODIUM CHLORIDE 0.9 % IV BOLUS
500.0000 mL | Freq: Once | INTRAVENOUS | Status: AC
  Administered 2018-03-05: 500 mL via INTRAVENOUS

## 2018-03-05 MED ORDER — SODIUM CHLORIDE 0.9 % IV BOLUS
1000.0000 mL | Freq: Once | INTRAVENOUS | Status: AC
  Administered 2018-03-05: 1000 mL via INTRAVENOUS

## 2018-03-05 MED ORDER — ONDANSETRON HCL 4 MG/2ML IJ SOLN
4.0000 mg | Freq: Once | INTRAMUSCULAR | Status: AC
  Administered 2018-03-05: 4 mg via INTRAVENOUS
  Filled 2018-03-05: qty 2

## 2018-03-05 MED ORDER — IOPAMIDOL (ISOVUE-300) INJECTION 61%
100.0000 mL | Freq: Once | INTRAVENOUS | Status: AC | PRN
  Administered 2018-03-05: 100 mL via INTRAVENOUS

## 2018-03-05 MED ORDER — DICYCLOMINE HCL 10 MG/ML IM SOLN
20.0000 mg | Freq: Once | INTRAMUSCULAR | Status: AC
  Administered 2018-03-05: 20 mg via INTRAMUSCULAR
  Filled 2018-03-05: qty 2

## 2018-03-05 MED ORDER — KETOROLAC TROMETHAMINE 30 MG/ML IJ SOLN
30.0000 mg | Freq: Once | INTRAMUSCULAR | Status: AC
  Administered 2018-03-05: 30 mg via INTRAVENOUS
  Filled 2018-03-05: qty 1

## 2018-03-05 NOTE — ED Provider Notes (Addendum)
[facility] EMERGENCY DEPARTMENT Provider Note   CSN: [id number] Arrival date & time: 03/05/18  0347  Time seen 04:10 AM   History   Chief Complaint Chief Complaint  Patient presents with  . Scott Moss    HPI ABBOTT JASINSKI is a 42 y.o. male.  HPI patient states July 31 he was at work and he started having a burning sharp Moss in his right lateral upper abdomen that radiates up into his right rib cage and makes him feel short of breath.  He has had nausea with the Moss.  He states the Moss comes and goes and lasts 5 to 10 minutes.  He states when he came home from work he ate dinner and he vomited afterwards.  He took a Zantac's and laid down for a while.  He ate some crackers and then vomited again.  He states that 2 3 this morning the Moss started getting worse.  He denies diarrhea, constipation, fever, hematuria.  He states laying down makes the Moss worse, squatting down makes it feel better.  He states he has been having this Moss off and on for the past few months and he notices it gets worse with spicy foods.  He states he had a ultrasound done at [facility] several years ago that was normal.  Not sure how he thought he had a diagnosis of gallbladder problems.  PCP Tysinger, Camelia Eng, PA-C   Past Medical History:  Diagnosis Date  . Arthritis   . Atypical chest Moss 07/06/2015   Non-obstructive CAD on [facility] 04/2010.  [name] Chondromalacia of left knee 09/2012  . Complication of anesthesia   . Family history of cancer    multiple family members, multiple types.   Sees Dr. Marin Olp  . Family history of cancer   . Family history of premature CAD   . H/O cardiovascular stress test 07/14/2015   myocardial perfusion scan, normal, EF 45-54% mildy reduced LV function, Dr. Oval Linsey  . H/O echocardiogram 07/18/2015   LV mild hypertrophy, 55-60%EF, Dr. Oval Linsey  . High cholesterol   . Hypertension   . Obesity   . Overweight   . PONV (postoperative nausea and vomiting)   .  Testicular cancer ([place]) 02/2005   Dr. Marin Olp    Patient Active Problem List   Diagnosis Date Noted  . Other fatigue 06/11/2017  . Scott Moss 06/11/2017  . Vomiting 06/11/2017  . Hyperlipidemia 01/29/2017  . Elevated liver enzymes 01/29/2017  . Fatty liver 01/29/2017  . Moss and swelling of left knee 01/29/2017  . Encounter for health maintenance examination in adult 04/03/2016  . Anal fissure 04/03/2016  . Family history of premature CAD 04/03/2016  . Family history of cancer 04/03/2016  . Obesity 04/03/2016  . Hyperhidrosis 04/03/2016  . Screening for prostate cancer 04/03/2016  . Influenza vaccination declined 04/03/2016  . Abnormal hearing screen 04/03/2016  . Abnormal tympanic membrane of left ear 04/03/2016  . Chest Moss 07/06/2015  . Rectal bleeding 06/20/2015  . Scott Moss, LLQ 12/02/2011  . H/O testicular cancer 12/02/2011  . Hypertension     Past Surgical History:  Procedure Laterality Date  . ANTERIOR CERVICAL DECOMP/DISCECTOMY FUSION  11/11/2008   C5-6  . APPENDECTOMY    . CARDIAC CATHETERIZATION  04/13/2010   no disease  . CERVICAL SPINE SURGERY  2015   Dr. Deri Fuelling  . COLONOSCOPY     prior, no problems per patient, year unknown, possibly in early 81s.  [name] KNEE ARTHROSCOPY Right   .  KNEE ARTHROSCOPY Left 10/19/2015   Procedure: LEFT KNEE SCOPE CHONDROPLASTY;  Surgeon: [name], MD;  Location: [place];  Service: Orthopedics;  Laterality: Left;  . KNEE ARTHROSCOPY WITH LATERAL RELEASE Left 09/18/2012   Procedure: KNEE ARTHROSCOPY WITH LATERAL RELEASE;  Surgeon: [name], MD;  Location: [place];  Service: Orthopedics;  Laterality: Left;  WITH DEBRIDEMENT AND SHAVING(CHONDROPLASTY), Excision Medial [place]  . NASAL SINUS SURGERY  2014  . PARTIAL KNEE ARTHROPLASTY Left 08/15/2016   Procedure: PATELLA FEMORAL REPLACEMENT LEFT KNEE;  Surgeon: [name], MD;  Location: [place];  Service:  Orthopedics;  Laterality: Left;  . RADICAL ORCHIECTOMY  02/07/2005   left; with left testicular implant        Home Medications    Prior to Admission medications   Medication Sig Start Date End Date Taking? Authorizing Provider  aspirin 81 MG EC tablet TAKE 1 TABLET BY MOUTH ONCE DAILY 02/19/18   Tysinger, Camelia Eng, PA-C  dexlansoprazole (DEXILANT) 60 MG capsule Take 1 capsule (60 mg total) daily by mouth. Patient not taking: Reported on 01/30/2018 06/11/17   Tysinger, Camelia Eng, PA-C  lisinopril-hydrochlorothiazide (PRINZIDE,ZESTORETIC) 20-12.5 MG tablet take 1 tablet by mouth once daily 05/28/17   Tysinger, Camelia Eng, PA-C  oxybutynin (DITROPAN) 5 MG tablet Take 1 tablet (5 mg total) daily by mouth. 06/11/17   Tysinger, Camelia Eng, PA-C  pravastatin (PRAVACHOL) 20 MG tablet TAKE 1 TABLET BY MOUTH ONCE DAILY 02/19/18   Tysinger, Camelia Eng, PA-C  predniSONE (DELTASONE) 10 MG tablet 6/5/4/3/2/1 taper 02/09/18   Tysinger, Camelia Eng, PA-C    Family History Family History  Problem Relation Age of Onset  . Ovarian cancer Mother        uterine, mets  . Hypertension Mother   . Stroke Mother   . Migraines Mother   . Cancer Mother   . Lung cancer Father   . Hypertension Father   . Heart attack Father 95  . Skin cancer Father        ?  [name] Heart disease Father   . Cancer Father        lung  . Cancer Sister 26       multiple myeloma  . Hypertension Sister   . Migraines Sister   . Diabetes Maternal Uncle   . Colon cancer Maternal Uncle     Social History Social History   Tobacco Use  . Smoking status: Never Smoker  . Smokeless tobacco: Never Used  . Tobacco comment: never used tobacco  Substance Use Topics  . Alcohol use: Yes    Alcohol/week: 1.8 oz    Types: 3 Cans of beer per week    Comment: social  . Drug use: No  lives with spouse drinks 2-3 beers daily employed   Allergies   Benicar [olmesartan] and Nexium [esomeprazole magnesium]   Review of Systems Review of Systems  All  other systems reviewed and are negative.    Physical Exam Updated Vital Signs BP (!) 127/97 (BP Location: Left Arm)   Pulse 80   Temp 97.8 F (36.6 C) (Oral)   Resp 20   Ht _0  (1.753 m)   Wt 99.8 kg (220 lb)   SpO2 97%   BMI 32.49 kg/m   Vital signs normal    Physical Exam  Constitutional: He is oriented to person, place, and time. He appears well-developed and well-nourished.  Non-toxic appearance. He does not appear ill. No distress.  When I enter the room patient is squatting down with his head on the stretcher and his knees bent.  HENT:  Head: Normocephalic and atraumatic.  Right Ear: External ear normal.  Left Ear: External ear normal.  Nose: Nose normal. No mucosal edema or rhinorrhea.  Mouth/Throat: Oropharynx is clear and moist and mucous membranes are normal. No dental abscesses or uvula swelling.  Eyes: Pupils are equal, round, and reactive to light. Conjunctivae and EOM are normal.  Neck: Normal range of motion and full passive range of motion without Moss. Neck supple.  Cardiovascular: Normal rate, regular rhythm and normal heart sounds. Exam reveals no gallop and no friction rub.  No murmur heard. Pulmonary/Chest: Effort normal and breath sounds normal. No respiratory distress. He has no wheezes. He has no rhonchi. He has no rales. He exhibits no tenderness and no crepitus.  Scott: Soft. Normal appearance and bowel sounds are normal. He exhibits no distension. There is tenderness in the right upper quadrant. There is no rebound and no guarding.  No CVA tenderness  Musculoskeletal: Normal range of motion. He exhibits no edema or tenderness.  Moves all extremities well.   Neurological: He is alert and oriented to person, place, and time. He has normal strength. No cranial nerve deficit.  Skin: Skin is warm, dry and intact. No rash noted. No erythema. No pallor.  Psychiatric: He has a normal mood and affect. His speech is normal and behavior is normal. His  mood appears not anxious.  Nursing note and vitals reviewed.    ED Treatments / Results  Labs (all labs ordered are listed, but only abnormal results are displayed) Results for orders placed or performed during the hospital encounter of 03/05/18  Comprehensive metabolic panel  Result Value Ref Range   Sodium 136 135 - 145 mmol/L   Potassium 4.2 3.5 - 5.1 mmol/L   Chloride 106 98 - 111 mmol/L   CO2 23 22 - 32 mmol/L   Glucose, Bld 111 (H) 70 - 99 mg/dL   BUN 16 6 - 20 mg/dL   Creatinine, Ser 1.06 0.61 - 1.24 mg/dL   Calcium 8.6 (L) 8.9 - 10.3 mg/dL   Total Protein 7.0 6.5 - 8.1 g/dL   Albumin 4.0 3.5 - 5.0 g/dL   AST 24 15 - 41 U/L   ALT 40 0 - 44 U/L   Alkaline Phosphatase 72 38 - 126 U/L   Total Bilirubin 0.7 0.3 - 1.2 mg/dL   GFR calc non Af Amer >60 >60 mL/min   GFR calc Af Amer >60 >60 mL/min   Anion gap 7 5 - 15  Lipase, blood  Result Value Ref Range   Lipase 46 11 - 51 U/L  CBC with Differential  Result Value Ref Range   WBC 7.1 4.0 - 10.5 K/uL   RBC 5.12 4.22 - 5.81 MIL/uL   Hemoglobin 16.0 13.0 - 17.0 g/dL   HCT 46.6 39.0 - 52.0 %   MCV 91.0 78.0 - 100.0 fL   MCH 31.3 26.0 - 34.0 pg   MCHC 34.3 30.0 - 36.0 g/dL   RDW 12.5 11.5 - 15.5 %   Platelets 270 150 - 400 K/uL   Neutrophils Relative % 54 %   Neutro Abs 3.8 1.7 - 7.7 K/uL   Lymphocytes Relative 28 %   Lymphs Abs 2.0 0.7 - 4.0 K/uL   Monocytes Relative 15 %   Monocytes Absolute 1.0 0.1 - 1.0 K/uL   Eosinophils Relative 4 %  Eosinophils Absolute 0.3 0.0 - 0.7 K/uL   Basophils Relative 1 %   Basophils Absolute 0.0 0.0 - 0.1 K/uL  Urinalysis, Routine w reflex microscopic  Result Value Ref Range   Color, Urine YELLOW YELLOW   APPearance CLEAR CLEAR   Specific Gravity, Urine >1.046 (H) 1.005 - 1.030   pH 5.0 5.0 - 8.0   Glucose, UA NEGATIVE NEGATIVE mg/dL   Hgb urine dipstick NEGATIVE NEGATIVE   Bilirubin Urine NEGATIVE NEGATIVE   Ketones, ur NEGATIVE NEGATIVE mg/dL   Protein, ur NEGATIVE NEGATIVE  mg/dL   Nitrite NEGATIVE NEGATIVE   Leukocytes, UA NEGATIVE NEGATIVE  Urine rapid drug screen (hosp performed)  Result Value Ref Range   Opiates NONE DETECTED NONE DETECTED   Cocaine NONE DETECTED NONE DETECTED   Benzodiazepines NONE DETECTED NONE DETECTED   Amphetamines NONE DETECTED NONE DETECTED   Tetrahydrocannabinol NONE DETECTED NONE DETECTED   Barbiturates NONE DETECTED NONE DETECTED  Ethanol  Result Value Ref Range   Alcohol, Ethyl (B) <10 <10 mg/dL  D-dimer, quantitative  Result Value Ref Range   D-Dimer, Quant <0.27 0.00 - 0.50 ug/mL-FEU   Laboratory interpretation all normal except concentrated UA c/w dehydration    EKG None  Radiology Ct Abdomen Pelvis W Contrast  Result Date: 03/05/2018 CLINICAL DATA:  Right upper quadrant Moss. EXAM: CT ABDOMEN AND PELVIS WITH CONTRAST TECHNIQUE: Multidetector CT imaging of the abdomen and pelvis was performed using the standard protocol following bolus administration of intravenous contrast. CONTRAST:  129m ISOVUE-300 IOPAMIDOL (ISOVUE-300) INJECTION 61% COMPARISON:  CT 01/19/2015 FINDINGS: Lower chest: Stable small pulmonary nodules in both lower lobes. No consolidation or pleural fluid. Hepatobiliary: Decreased hepatic density consistent with steatosis. No discrete hepatic lesion. Gallbladder physiologically distended, no calcified stone. No pericholecystic inflammation. No biliary dilatation. Pancreas: No ductal dilatation or inflammation. Spleen: Normal in size without focal abnormality. Small splenule at the hilum. Adrenals/Urinary Tract: Normal adrenal glands. No hydronephrosis or perinephric edema. Homogeneous renal enhancement and symmetric excretion on delayed phase imaging. Small cyst in the medial left kidney. Urinary bladder is physiologically distended without wall thickening. Stomach/Bowel: Stomach is within normal limits. Appendix surgically absent. No evidence of bowel wall thickening, distention, or inflammatory changes.  Vascular/Lymphatic: No adenopathy. Normal-appearing vascular structures. Reproductive: Prostate is unremarkable. Other: No free air, free fluid, or intra-Scott fluid collection. Tiny fat containing umbilical hernia. Musculoskeletal: Stable sclerotic density in the right iliac bone consistent with bone island. Avascular necrosis of both femoral heads, new from prior exam. No subchondral collapse. IMPRESSION: 1. No acute abnormality in the abdomen/pelvis. Particularly, normal CT appearance of the gallbladder. 2. Hepatic steatosis, unchanged. 3. Avascular necrosis of bilateral femoral heads without subchondral collapse, age indeterminate but new from most recent comparison 2016 CT. Electronically Signed   By: MJeb LeveringM.D.   On: 03/05/2018 05:55    Procedures Procedures (including critical care time)  Medications Ordered in ED Medications  ketorolac (TORADOL) 30 MG/ML injection 30 mg (30 mg Intravenous Given 03/05/18 0436)  ondansetron (ZOFRAN) injection 4 mg (4 mg Intravenous Given 03/05/18 0435)  dicyclomine (BENTYL) injection 20 mg (20 mg Intramuscular Given 03/05/18 0437)  iopamidol (ISOVUE-300) 61 % injection 100 mL (100 mLs Intravenous Contrast Given 03/05/18 0500)  sodium chloride 0.9 % bolus 1,000 mL (0 mLs Intravenous Stopped 03/05/18 0702)  sodium chloride 0.9 % bolus 500 mL (0 mLs Intravenous Stopped 03/05/18 0702)     Initial Impression / Assessment and Plan / ED Course  I have reviewed the triage vital signs  and the nursing notes.  Pertinent labs & imaging results that were available during my care of the patient were reviewed by me and considered in my medical decision making (see chart for details).     Patient has had several CT scans, several were in 2013 in 2014, his last one here was 2016.  The scans did not show anything acute to explain his other Scott pains that he had. When I review his prior x-ray studies his ultrasound was May 2013, both abdomen and pelvis  ultrasounds and they were both read as normal.  Patient does have a history of testicular cancer so he does have a increased risk of having something in his abdomen.  Therefore CT was done again tonight.  I explained to patient and his wife ultrasounds not available to me until much later this morning.  When I was leaving the room patient states "I do not want any Moss medication".  He was given Toradol and Bentyl for Moss.  05:55  AM I went over patient's blood test results which were all normal, and his CT scan with patient and his wife.  Patient is noted to have diffuse stool throughout his colon with a large amount of stool in his right descending colon.  At this point I am waiting to give him narcotic Moss medication because I am afraid his diagnosis is going to be constipation.  His gallbladder looks okay to me but again I am not a radiologist  06:10 AM patient CT has resulted and I went over the results with patient and his wife.  There was no enlarged lymph nodes that would be of concern with his history of testicular cancer.  His gallbladder looked fine, there was actually no acute findings.  The radiologist did not comment on the amount of stool burden but patient does have a lot of stool burden on the right ascending colon.  He will be treated for constipation.  Patient was given IV fluids to get a urine to make sure there was not a urinary tract infection.  Patient keeps talking about his Moss however he then says "I don't want anything for Moss".  Recheck at 7:15 AM we discussed his urinalysis which was very concentrated.  Patient states he does sweat a lot at work and he drinks water, some Gatorade, but a lot of caffeine drinks such as sun drop.  We discussed trying to cut back on the caffeinated drinks and to drink more the sports drinks.  He states he has a history of kidney stones.  I informed him that dehydration will make him formed kidney stones much easier.  [place] will keep him  constipated.  He has received 1500 cc of normal saline in the ED.  Review the [place] shows patient has had 5 prescriptions for controlled substances in the past 2 years.  He got #30 hydrocodone on December 2017, #60 oxycodone 5/325 on January 2018, #60 oxycodone 5/325 on August 23, 2016, #10 oxycodone 5/320 12/17/2016 and #15 hydrocodone 7.5/325 on April 2019.  The last was by his primary care doctor, the next to last was from the ED, 2 were from Dr. Nancy Fetter and the first one was from Dr. Alfonso Ramus.  Final Clinical Impressions(s) / ED Diagnoses   Final diagnoses:  Right upper quadrant Scott Moss  Constipation, unspecified constipation type  Dehydration    ED Discharge Orders    None    OTC miralax  Plan discharge  Rolland Porter, MD,  Barbette Or, MD 03/05/18 4883    Rolland Porter, MD 03/05/18 [phone]

## 2018-03-05 NOTE — ED Triage Notes (Signed)
Pt c/o sharp pain to right upper abd that started last night, states he has had gallbladder problems in the past.  Pt has also been vomiting, denies diarrhea.

## 2018-03-05 NOTE — Discharge Instructions (Addendum)
Get miralax and put one dose or 17 g in 8 ounces of water,  take 1 dose every 30 minutes for 2-3 hours or until you  get good results and then once or twice daily to prevent constipation. Drink more sports drinks when you are out in the heat and sweating a lot. Try to cut back on caffeinated drinks like the sundrop, they will keep you more dehydrated. This will make you get kidney stones easier and also keep you constipated.

## 2018-04-07 ENCOUNTER — Encounter: Payer: Self-pay | Admitting: Family Medicine

## 2018-04-07 ENCOUNTER — Ambulatory Visit: Payer: BLUE CROSS/BLUE SHIELD | Admitting: Medical

## 2018-04-07 VITALS — BP 124/80 | HR 99 | Temp 98.0°F | Resp 18 | Ht 69.0 in | Wt 220.4 lb

## 2018-04-07 DIAGNOSIS — H6690 Otitis media, unspecified, unspecified ear: Secondary | ICD-10-CM | POA: Diagnosis not present

## 2018-04-07 DIAGNOSIS — R05 Cough: Secondary | ICD-10-CM

## 2018-04-07 DIAGNOSIS — J029 Acute pharyngitis, unspecified: Secondary | ICD-10-CM | POA: Diagnosis not present

## 2018-04-07 DIAGNOSIS — R52 Pain, unspecified: Secondary | ICD-10-CM

## 2018-04-07 DIAGNOSIS — R059 Cough, unspecified: Secondary | ICD-10-CM

## 2018-04-07 LAB — POCT INFLUENZA A/B
INFLUENZA A, POC: NEGATIVE
INFLUENZA B, POC: NEGATIVE

## 2018-04-07 MED ORDER — AMOXICILLIN-POT CLAVULANATE 875-125 MG PO TABS: 1.0000 | ORAL_TABLET | Freq: Two times a day (BID) | ORAL | 0 refills | Status: DC

## 2018-04-07 NOTE — Progress Notes (Addendum)
Subjective: Chief Complaint  Patient presents with  . cold    cold, cough, ear ache, sore throat, sob, congestion headache X Saturday   Here for 3-4 day hx/o cold symptoms, cough, ear ache, sore throat, body aches, chills.   No NVD.  Cough with white sputum.  No fever, but has felt clammy and sweaty.   Congestion in head and chest.  No sick contacts.   Using advil sinus. No sick contacts.  No other aggravating or relieving factors. No other complaint.   Past Medical History:  Diagnosis Date  . Arthritis   . Atypical chest pain 07/06/2015   Non-obstructive CAD on [facility] 04/2010.  [name] Chondromalacia of left knee 09/2012  . Complication of anesthesia   . Family history of cancer    multiple family members, multiple types.   Sees Dr. Marin Olp  . Family history of cancer   . Family history of premature CAD   . H/O cardiovascular stress test 07/14/2015   myocardial perfusion scan, normal, EF 45-54% mildy reduced LV function, Dr. Oval Linsey  . H/O echocardiogram 07/18/2015   LV mild hypertrophy, 55-60%EF, Dr. Oval Linsey  . High cholesterol   . Hypertension   . Obesity   . Overweight   . PONV (postoperative nausea and vomiting)   . Testicular cancer ([place]) 02/2005   Dr. Marin Olp   Current Outpatient Medications on File Prior to Visit  Medication Sig Dispense Refill  . aspirin 81 MG EC tablet TAKE 1 TABLET BY MOUTH ONCE DAILY 90 tablet 0  . lisinopril-hydrochlorothiazide (PRINZIDE,ZESTORETIC) 20-12.5 MG tablet take 1 tablet by mouth once daily 90 tablet 3  . oxybutynin (DITROPAN) 5 MG tablet Take 1 tablet (5 mg total) daily by mouth. 90 tablet 3  . pravastatin (PRAVACHOL) 20 MG tablet TAKE 1 TABLET BY MOUTH ONCE DAILY 90 tablet 0   No current facility-administered medications on file prior to visit.    ROS as in subjective    Objective: BP 124/80   Pulse 99   Temp 98 F (36.7 C) (Oral)   Resp 18   Ht 5\' 9"  (1.753 m)   Wt 220 lb 6.4 oz (100 kg)   SpO2 98%   BMI 32.55 kg/m   Wt  Readings from Last 3 Encounters:  04/07/18 220 lb 6.4 oz (100 kg)  03/05/18 220 lb (99.8 kg)  01/30/18 226 lb 12.8 oz (102.9 kg)   General: Ill-appearing, well-developed, well-nourished Skin: warm, dry HEENT: Nose inflamed and congested, clear conjunctiva, TMs retracted with erythema, purulent fluid behind TMs, +sinus tenderness, pharynx with moderate erythema, no exudates Neck: Supple, non tender, shotty cervical adenopathy Heart: Regular rate and rhythm, normal S1, S2, no murmurs Lungs: Clear to auscultation bilaterally, no wheezes, rales, rhonchi Abdomen: Non tender non distended Extremities: Mild generalized tenderness    Assessment: Encounter Diagnoses  Name Primary?  . Cough Yes  . Body aches   . Acute otitis media, unspecified otitis media type   . Sore throat      Plan: Discussed symptoms and exam. Begin antibiotic as below, discussed supportive care such as salt water gargles, warm fluids, Tylenol for pain, rest, hydration. Discussed possible complications that would prompt urgent recheck Discussed prevention to others, hygiene measures Follow-up PRN if not improving the next 72 hours  Of note, CMA read flu test as faintly + A, but after reviewing the test, I disagree.   We will treat for likely bacterial pharyngitis and OM/URI.  Gianno was seen today for cold.  Diagnoses and  all orders for this visit:  Cough  Body aches  Acute otitis media, unspecified otitis media type  Sore throat  Other orders -     amoxicillin-clavulanate (AUGMENTIN) 875-125 MG tablet; Take 1 tablet by mouth 2 (two) times daily.

## 2018-04-07 NOTE — Patient Instructions (Signed)
Recommendations:  Rest Begin Augmentin antibiotic You can use Coricidin HBP or Mucinex DM over the counter for congestion Avoid other "decongestants" as they can raise your blood pressure You can use Tylenol for pain/fever Avoid close contacts with others to limit spread of infection  If not improving or worse in the coming days, then call or return

## 2018-05-11 ENCOUNTER — Encounter: Payer: Self-pay | Admitting: Family Medicine

## 2018-05-11 ENCOUNTER — Ambulatory Visit: Payer: BLUE CROSS/BLUE SHIELD | Admitting: Medical

## 2018-05-11 ENCOUNTER — Encounter: Payer: Self-pay | Admitting: Medical

## 2018-05-11 ENCOUNTER — Telehealth: Payer: Self-pay

## 2018-05-11 ENCOUNTER — Telehealth: Payer: Self-pay | Admitting: Medical

## 2018-05-11 ENCOUNTER — Ambulatory Visit
Admission: RE | Admit: 2018-05-11 | Discharge: 2018-05-11 | Disposition: A | Discharge status: Home / Self Care | Payer: BLUE CROSS/BLUE SHIELD | Source: Ambulatory Visit | Attending: Medical | Admitting: Medical

## 2018-05-11 VITALS — BP 130/74 | HR 87 | Temp 97.8°F | Resp 16 | Ht 69.0 in | Wt 229.8 lb

## 2018-05-11 DIAGNOSIS — R935 Abnormal findings on diagnostic imaging of other abdominal regions, including retroperitoneum: Secondary | ICD-10-CM

## 2018-05-11 DIAGNOSIS — M545 Low back pain, unspecified: Secondary | ICD-10-CM

## 2018-05-11 DIAGNOSIS — R112 Nausea with vomiting, unspecified: Secondary | ICD-10-CM

## 2018-05-11 DIAGNOSIS — M25552 Pain in left hip: Secondary | ICD-10-CM

## 2018-05-11 DIAGNOSIS — M25551 Pain in right hip: Secondary | ICD-10-CM | POA: Diagnosis not present

## 2018-05-11 LAB — POCT URINALYSIS DIP (PROADVANTAGE DEVICE)
BILIRUBIN UA: NEGATIVE
BILIRUBIN UA: NEGATIVE mg/dL
Blood, UA: NEGATIVE
Glucose, UA: NEGATIVE mg/dL
Leukocytes, UA: NEGATIVE
Nitrite, UA: NEGATIVE
Protein Ur, POC: NEGATIVE mg/dL
SPECIFIC GRAVITY, URINE: 1.01
Urobilinogen, Ur: NEGATIVE
pH, UA: 6 (ref 5.0–8.0)

## 2018-05-11 LAB — BASIC METABOLIC PANEL
BUN / CREAT RATIO: 10 (ref 9–20)
BUN: 11 mg/dL (ref 6–24)
CO2: 26 mmol/L (ref 20–29)
CREATININE: 1.05 mg/dL (ref 0.76–1.27)
Calcium: 9.4 mg/dL (ref 8.7–10.2)
Chloride: 102 mmol/L (ref 96–106)
GFR, EST AFRICAN AMERICAN: 100 mL/min/{1.73_m2} (ref >59)
GFR, EST NON AFRICAN AMERICAN: 87 mL/min/{1.73_m2} (ref >59)
Glucose: 97 mg/dL (ref 65–99)
Potassium: 5.2 mmol/L (ref 3.5–5.2)
Sodium: 139 mmol/L (ref 134–144)

## 2018-05-11 LAB — CBC
HEMATOCRIT: 44.5 % (ref 37.5–51.0)
Hemoglobin: 15.3 g/dL (ref 13.0–17.7)
MCH: 30.8 pg (ref 26.6–33.0)
MCHC: 34.4 g/dL (ref 31.5–35.7)
MCV: 90 fL (ref 79–97)
Platelets: 312 10*3/uL (ref 150–450)
RBC: 4.96 x10E6/uL (ref 4.14–5.80)
RDW: 13.7 % (ref 12.3–15.4)
WBC: 7.2 10*3/uL (ref 3.4–10.8)

## 2018-05-11 MED ORDER — KETOROLAC TROMETHAMINE 60 MG/2ML IM SOLN
60.0000 mg | Freq: Once | INTRAMUSCULAR | Status: AC
  Administered 2018-05-11: 60 mg via INTRAMUSCULAR

## 2018-05-11 MED ORDER — ONDANSETRON HCL 4 MG PO TABS: 4.0000 mg | ORAL_TABLET | Freq: Three times a day (TID) | ORAL | 0 refills | Status: DC | PRN

## 2018-05-11 MED ORDER — OXYCODONE-ACETAMINOPHEN 5-325 MG PO TABS: 1.0000 | ORAL_TABLET | Freq: Four times a day (QID) | ORAL | 0 refills | Status: DC | PRN

## 2018-05-11 NOTE — Progress Notes (Signed)
Subjective: Chief Complaint  Patient presents with  . back pain    back pain left side, Nausea X saturday   Here as a walk-in for acute back pain.  He notes that he has had pain for 3 days, unchanged, was thinking about going to the emergency department.  He denies any recent fall or trauma but has left low back pain and wife said there is an area of swelling in the same area.  He denies abdominal pain.  His urine has looked somewhat dark.  He denies fever, saddle anesthesia, leg pain, paresthesias, no pain in genitalia, no problems with bowel movements, bowel movements are soft and regular.  He does note some urinary pressure at times but no blood in the urine no frequency no odor to urine.  No history of kidney stones.  Over the weekend he has used ibuprofen over-the-counter 3 tablets twice daily, heat pad. No other aggravating or relieving factors. No other complaint.  Past Medical History:  Diagnosis Date  . Arthritis   . Atypical chest pain 07/06/2015   Non-obstructive CAD on [facility] 04/2010.  [name] Chondromalacia of left knee 09/2012  . Complication of anesthesia   . Family history of cancer    multiple family members, multiple types.   Sees Dr. Marin Olp  . Family history of cancer   . Family history of premature CAD   . H/O cardiovascular stress test 07/14/2015   myocardial perfusion scan, normal, EF 45-54% mildy reduced LV function, Dr. Oval Linsey  . H/O echocardiogram 07/18/2015   LV mild hypertrophy, 55-60%EF, Dr. Oval Linsey  . High cholesterol   . Hypertension   . Obesity   . Overweight   . PONV (postoperative nausea and vomiting)   . Testicular cancer ([place]) 02/2005   Dr. Marin Olp   Current Outpatient Medications on File Prior to Visit  Medication Sig Dispense Refill  . aspirin 81 MG EC tablet TAKE 1 TABLET BY MOUTH ONCE DAILY 90 tablet 0  . lisinopril-hydrochlorothiazide (PRINZIDE,ZESTORETIC) 20-12.5 MG tablet take 1 tablet by mouth once daily 90 tablet 3  . oxybutynin (DITROPAN) 5 MG  tablet Take 1 tablet (5 mg total) daily by mouth. 90 tablet 3  . pravastatin (PRAVACHOL) 20 MG tablet TAKE 1 TABLET BY MOUTH ONCE DAILY 90 tablet 0  . amoxicillin-clavulanate (AUGMENTIN) 875-125 MG tablet Take 1 tablet by mouth 2 (two) times daily. (Patient not taking: Reported on 05/11/2018) 20 tablet 0   No current facility-administered medications on file prior to visit.    ROS as in subjective    Objective: BP 130/74   Pulse 87   Temp 97.8 F (36.6 C) (Oral)   Resp 16   Ht 5\' 9"  (1.753 m)   Wt 229 lb 12.8 oz (104.2 kg)   SpO2 97%   BMI 33.94 kg/m   Wt Readings from Last 3 Encounters:  05/11/18 229 lb 12.8 oz (104.2 kg)  04/07/18 220 lb 6.4 oz (100 kg)  03/05/18 220 lb (99.8 kg)   General appearance: alert, in pain, WD/WN, vomiting Oral cavity: MMM, no lesions Abdomen: +bs, soft, mild right lower quadrant tenderness, otherwise non tender, non distended, no masses, no hepatomegaly, no splenomegaly There is a localized area of tenderness over the left low back/left posterior hip region with slight area of swelling in the same area, pain with flexion and extension of back that is reduced due to pain Somewhat reduced range of motion of bilateral hips without a lot of pain, pain more localized to the left  low back area, he denies pain with weightbearing in general No obvious extremity edema Legs seemingly normal strength and sensation and DTRs Pulses: 2+ symmetric, upper and lower extremities, normal cap refill    Assessment: Encounter Diagnoses  Name Primary?  . Acute left-sided low back pain without sciatica Yes  . Nausea and vomiting, intractability of vomiting not specified, unspecified vomiting type   . Left hip pain   . Abnormal CT of the abdomen       Plan: We discussed symptoms, possible etiology, gave 60 mg injection of Toradol here.  Gave Zofran 4 mg here.  He did not feel improvement while here.  We will check stat labs.  I will send for x-ray of hips as  well.  We discussed the possibility of kidney stone but symptoms and exam suggest more of a musculoskeletal issue.  There is a localized area of pain and swelling of the left posterior hip region.  He seemed to have pain with range of motion of back to some extent.  Reviewed his CT abdomen from emergency department visit back in August that shows abnormality of both hips he was unaware of.  We discussed those findings and possible follow-up with orthopedics  Follow-up pending studies below  [place] was seen today for back pain.  Diagnoses and all orders for this visit:  Acute left-sided low back pain without sciatica -     POCT Urinalysis DIP (Proadvantage Device) -     ketorolac (TORADOL) injection 60 mg -     Basic metabolic panel -     CBC -     DG HIPS BILAT WITH PELVIS 3-4 VIEWS; Future  Nausea and vomiting, intractability of vomiting not specified, unspecified vomiting type -     CBC  Left hip pain -     CBC -     DG HIPS BILAT WITH PELVIS 3-4 VIEWS; Future  Abnormal CT of the abdomen -     CBC -     DG HIPS BILAT WITH PELVIS 3-4 VIEWS; Future  Other orders -     ondansetron (ZOFRAN) 4 MG tablet; Take 1 tablet (4 mg total) by mouth every 8 (eight) hours as needed for nausea or vomiting. -     oxyCODONE-acetaminophen (PERCOCET) 5-325 MG tablet; Take 1 tablet by mouth every 6 (six) hours as needed for severe pain.

## 2018-05-11 NOTE — Telephone Encounter (Signed)
Pt gave verbal that I could give his wife his after visit summary and his out of work note,

## 2018-05-11 NOTE — Patient Instructions (Signed)
Recommendations:  Please go for x-ray of the left hip at your convenience preferably this morning to [facility] imaging  We will call with lab results  Go home and rest soon in a comfortable position  You can use over-the-counter ibuprofen 200 mg, 3 tablets 3 times daily for the next few days  I sent Percocet pain medicine you can use as a back-up for severe pain, up to every 4-6 hours  I sent Zofran you can use for nausea 1 tablet every 4-6 hours  If you see any new symptoms or signs such as rash over the back, blood in the urine, worsening pain or other call return or go to the emergency department  Your recent CT of the abdomen from August 2019 visit the emergency department showed some abnormality of the hip.  This may be related to your current pain  I will likely need to send you to orthopedics for further evaluation

## 2018-05-11 NOTE — Telephone Encounter (Signed)
Patient has appointment tomorrow with Alfonso Ramus, [place] with ortho

## 2018-05-12 DIAGNOSIS — M545 Low back pain: Secondary | ICD-10-CM | POA: Diagnosis not present

## 2018-05-18 ENCOUNTER — Other Ambulatory Visit: Payer: Self-pay | Admitting: Sports Medicine

## 2018-05-18 DIAGNOSIS — M545 Low back pain, unspecified: Secondary | ICD-10-CM

## 2018-05-20 ENCOUNTER — Other Ambulatory Visit: Payer: Self-pay | Admitting: Medical

## 2018-05-20 ENCOUNTER — Ambulatory Visit
Admission: RE | Admit: 2018-05-20 | Discharge: 2018-05-20 | Disposition: A | Discharge status: Home / Self Care | Payer: BLUE CROSS/BLUE SHIELD | Source: Ambulatory Visit | Attending: Sports Medicine | Admitting: Sports Medicine

## 2018-05-20 ENCOUNTER — Other Ambulatory Visit: Payer: BLUE CROSS/BLUE SHIELD

## 2018-05-20 DIAGNOSIS — M545 Low back pain, unspecified: Secondary | ICD-10-CM

## 2018-05-22 DIAGNOSIS — M5106 Intervertebral disc disorders with myelopathy, lumbar region: Secondary | ICD-10-CM | POA: Diagnosis not present

## 2018-05-23 ENCOUNTER — Other Ambulatory Visit: Payer: BLUE CROSS/BLUE SHIELD

## 2018-05-24 ENCOUNTER — Other Ambulatory Visit: Payer: BLUE CROSS/BLUE SHIELD

## 2018-06-03 DIAGNOSIS — M4317 Spondylolisthesis, lumbosacral region: Secondary | ICD-10-CM | POA: Diagnosis not present

## 2018-06-10 ENCOUNTER — Other Ambulatory Visit: Payer: Self-pay | Admitting: Sports Medicine

## 2018-06-10 DIAGNOSIS — G8929 Other chronic pain: Secondary | ICD-10-CM

## 2018-06-10 DIAGNOSIS — M545 Low back pain, unspecified: Secondary | ICD-10-CM

## 2018-06-18 ENCOUNTER — Ambulatory Visit
Admission: RE | Admit: 2018-06-18 | Discharge: 2018-06-18 | Disposition: A | Discharge status: Home / Self Care | Payer: BLUE CROSS/BLUE SHIELD | Source: Ambulatory Visit | Attending: Sports Medicine | Admitting: Sports Medicine

## 2018-06-18 ENCOUNTER — Other Ambulatory Visit: Payer: Self-pay | Admitting: Sports Medicine

## 2018-06-18 DIAGNOSIS — G8929 Other chronic pain: Secondary | ICD-10-CM

## 2018-06-18 DIAGNOSIS — M545 Low back pain, unspecified: Secondary | ICD-10-CM

## 2018-06-18 MED ORDER — METHYLPREDNISOLONE ACETATE 40 MG/ML INJ SUSP (RADIOLOG
120.0000 mg | Freq: Once | INTRAMUSCULAR | Status: AC
  Administered 2018-06-18: 120 mg via EPIDURAL

## 2018-06-18 MED ORDER — IOPAMIDOL (ISOVUE-M 200) INJECTION 41%
1.0000 mL | Freq: Once | INTRAMUSCULAR | Status: AC
  Administered 2018-06-18: 1 mL via EPIDURAL

## 2018-06-18 NOTE — Discharge Instructions (Signed)

## 2018-08-07 ENCOUNTER — Ambulatory Visit: Payer: BLUE CROSS/BLUE SHIELD | Admitting: Medical

## 2018-08-07 ENCOUNTER — Encounter: Payer: Self-pay | Admitting: Medical

## 2018-08-07 VITALS — BP 130/80 | HR 76 | Temp 98.0°F | Resp 16 | Ht 69.0 in | Wt 225.6 lb

## 2018-08-07 DIAGNOSIS — H65113 Acute and subacute allergic otitis media (mucoid) (sanguinous) (serous), bilateral: Secondary | ICD-10-CM

## 2018-08-07 DIAGNOSIS — R05 Cough: Secondary | ICD-10-CM | POA: Diagnosis not present

## 2018-08-07 DIAGNOSIS — J988 Other specified respiratory disorders: Secondary | ICD-10-CM

## 2018-08-07 DIAGNOSIS — J029 Acute pharyngitis, unspecified: Secondary | ICD-10-CM

## 2018-08-07 DIAGNOSIS — H65193 Other acute nonsuppurative otitis media, bilateral: Secondary | ICD-10-CM | POA: Insufficient documentation

## 2018-08-07 DIAGNOSIS — R059 Cough, unspecified: Secondary | ICD-10-CM | POA: Insufficient documentation

## 2018-08-07 MED ORDER — AMOXICILLIN-POT CLAVULANATE 875-125 MG PO TABS: 1.0000 | ORAL_TABLET | Freq: Two times a day (BID) | ORAL | 0 refills | Status: DC

## 2018-08-07 MED ORDER — HYDROCODONE-HOMATROPINE 5-1.5 MG/5ML PO SYRP: 5.0000 mL | ORAL_SOLUTION | Freq: Three times a day (TID) | ORAL | 0 refills | Status: AC | PRN

## 2018-08-07 NOTE — Progress Notes (Signed)
Subjective:   Scott Moss is a 43 y.o. male who presents with illness.  Here for a 4-day history of illness including cough, chest congestion, sore throat, headache, body aches, chills.  No fever, but feels awful.  Gradually feeling bad.   No sick contacts, using Mucinex, cold and cough med, also using some elderberry.  No shortness of breath, no wheezing no nausea vomiting.  No diarrhea.  No other aggravating or relieving factors.  No other c/o.  Past Medical History:  Diagnosis Date  . Arthritis   . Atypical chest pain 07/06/2015   Non-obstructive CAD on [facility] 04/2010.  [name] Chondromalacia of left knee 09/2012  . Complication of anesthesia   . Family history of cancer    multiple family members, multiple types.   Sees Dr. Marin Olp  . Family history of cancer   . Family history of premature CAD   . H/O cardiovascular stress test 07/14/2015   myocardial perfusion scan, normal, EF 45-54% mildy reduced LV function, Dr. Oval Linsey  . H/O echocardiogram 07/18/2015   LV mild hypertrophy, 55-60%EF, Dr. Oval Linsey  . High cholesterol   . Hypertension   . Obesity   . Overweight   . PONV (postoperative nausea and vomiting)   . Testicular cancer ([place]) 02/2005   Dr. Marin Olp    ROS as in subjective   Objective: BP 130/80   Pulse 76   Temp 98 F (36.7 C) (Oral)   Resp 16   Ht 5\' 9"  (1.753 m)   Wt 225 lb 9.6 oz (102.3 kg)   SpO2 97%   BMI 33.32 kg/m    General appearance: Alert, WD/WN, no distress, ill appearing                             Skin: warm, no rash                           Head: no sinus tenderness                            Eyes: conjunctiva normal, corneas clear, PERRLA                            Ears: left TM with erythema, serous effusion, retraction, right TM same as left but milder, external ear canals normal                          Nose: septum midline, turbinates swollen, with erythema and clear discharge             Mouth/throat: MMM, tongue normal, moderate  pharyngeal erythema, no exudate                            Neck: supple, no adenopathy, no thyromegaly, non tender                          Heart: RRR, normal S1, S2, no murmurs                         Lungs: bronchial breath sounds, no wheezes, rales, or rhonchi      Assessment  Encounter Diagnoses  Name Primary?  . Acute mucoid  otitis media of both ears Yes  . Sore throat   . Cough   . Respiratory tract infection       Plan: Discussed findings, treatment recommendations, and possible complications  Begin Augmentin antibiotic for ear and throat infection.    You may use the Hycodan syrup for cough and pain, but caution as this can cause drowsiness.  Specific home care recommendations today include:  REST  You may use OTC Guaifenesin (Mucinex plain) DM for cough and congestion, or plain Mucinex for congestion  Sore throat remedies:  You may use salt water gargles, warm fluids such as coffee or hot tea, or honey/tea/lemon mixture to sooth sore throat pain.  You may use OTC sore throat remedies such as Cepacol lozenges or Chloraseptic spray for sore throat pain.  Pain/fever relief: You may use over-the-counter Tylenol for pain or fever  Drink extra fluids. Fluids help thin the mucus so your sinuses can drain more easily.   Patient was advised to call or return if worse or not improving in the next few days.    Patient voiced understanding of diagnosis, recommendations, and treatment plan.  After visit summary given.

## 2018-08-07 NOTE — Patient Instructions (Signed)
  Begin Augmentin antiobit for ear and throat infection.    You may use the Hycodan syrup for cough and pain, but caution as this can cause drowsiness.  Specific home care recommendations today include:  REST  You may use OTC Guaifenesin (Mucinex plain) DM for cough and congestion, or plain Mucinex for congestion  Sore throat remedies:  You may use salt water gargles, warm fluids such as coffee or hot tea, or honey/tea/lemon mixture to sooth sore throat pain.  You may use OTC sore throat remedies such as Cepacol lozenges or Chloraseptic spray for sore throat pain.  Pain/fever relief: You may use over-the-counter Tylenol for pain or fever  Drink extra fluids. Fluids help thin the mucus so your sinuses can drain more easily.

## 2018-08-17 ENCOUNTER — Other Ambulatory Visit: Payer: Self-pay | Admitting: Medical

## 2018-08-17 NOTE — Telephone Encounter (Signed)
Is this ok to refill?  

## 2018-09-10 ENCOUNTER — Encounter: Payer: Self-pay | Admitting: Family Medicine

## 2018-09-10 ENCOUNTER — Ambulatory Visit: Payer: BLUE CROSS/BLUE SHIELD | Admitting: Family Medicine

## 2018-09-10 VITALS — BP 120/78 | HR 76 | Temp 97.5°F | Ht 69.0 in | Wt 226.8 lb

## 2018-09-10 DIAGNOSIS — R112 Nausea with vomiting, unspecified: Secondary | ICD-10-CM

## 2018-09-10 DIAGNOSIS — E785 Hyperlipidemia, unspecified: Secondary | ICD-10-CM

## 2018-09-10 DIAGNOSIS — R1011 Right upper quadrant pain: Secondary | ICD-10-CM

## 2018-09-10 LAB — CBC WITH DIFFERENTIAL/PLATELET
BASOS ABS: 0 10*3/uL (ref 0.0–0.2)
Basos: 0 %
EOS (ABSOLUTE): 0.3 10*3/uL (ref 0.0–0.4)
EOS: 3 %
Hematocrit: 47.8 % (ref 37.5–51.0)
Hemoglobin: 16.3 g/dL (ref 13.0–17.7)
LYMPHS ABS: 2.1 10*3/uL (ref 0.7–3.1)
Lymphs: 28 %
MCH: 31 pg (ref 26.6–33.0)
MCHC: 34.1 g/dL (ref 31.5–35.7)
MCV: 91 fL (ref 79–97)
MONOS ABS: 0.9 10*3/uL (ref 0.1–0.9)
Monocytes: 12 %
Neutrophils Absolute: 4.4 10*3/uL (ref 1.4–7.0)
Neutrophils: 57 %
PLATELETS: 334 10*3/uL (ref 150–450)
RBC: 5.25 x10E6/uL (ref 4.14–5.80)
RDW: 13.5 % (ref 11.6–15.4)
WBC: 7.7 10*3/uL (ref 3.4–10.8)

## 2018-09-10 LAB — COMPREHENSIVE METABOLIC PANEL
ALK PHOS: 87 IU/L (ref 39–117)
ALT: 36 IU/L (ref 0–44)
AST: 22 IU/L (ref 0–40)
Albumin/Globulin Ratio: 2 (ref 1.2–2.2)
Albumin: 5 g/dL (ref 4.0–5.0)
BILIRUBIN TOTAL: 0.4 mg/dL (ref 0.0–1.2)
BUN/Creatinine Ratio: 14 (ref 9–20)
BUN: 14 mg/dL (ref 6–24)
CHLORIDE: 101 mmol/L (ref 96–106)
CO2: 28 mmol/L (ref 20–29)
Calcium: 9.7 mg/dL (ref 8.7–10.2)
Creatinine, Ser: 0.97 mg/dL (ref 0.76–1.27)
GFR calc Af Amer: 109 mL/min/{1.73_m2} (ref >59)
GFR calc non Af Amer: 95 mL/min/{1.73_m2} (ref >59)
GLUCOSE: 99 mg/dL (ref 65–99)
Globulin, Total: 2.5 g/dL (ref 1.5–4.5)
Potassium: 4.9 mmol/L (ref 3.5–5.2)
Sodium: 137 mmol/L (ref 134–144)
TOTAL PROTEIN: 7.5 g/dL (ref 6.0–8.5)

## 2018-09-10 LAB — LIPASE: LIPASE: 53 U/L (ref 13–78)

## 2018-09-10 MED ORDER — FAMOTIDINE 20 MG PO TABS: 20.0000 mg | ORAL_TABLET | Freq: Two times a day (BID) | ORAL | 0 refills | Status: DC

## 2018-09-10 NOTE — Patient Instructions (Signed)
  Stay well hydrated by drinking water. Use the zofran to hep with your nausea and prevent further vomiting. Avoid fatty/greasy/fried foods, avoid spicy, citrus and acidic foods. Stick with a bland diet--start with clears and advance as tolerated (chicken noodle soup, can try some toast or a banana if feeling better).  We are going to schedule you for an abdominal ultrasound to look at your gall bladder. In the meantime, take famotidine twice daily to help with any gastritis or reflux symptoms (suggested by the pain radiating up into your chest yesterday). Avoid anti-inflammatories; okay to continue tylenol for pain (but avoid ibuprofen, aleve).

## 2018-09-10 NOTE — Progress Notes (Signed)
Chief Complaint  Patient presents with  . Abdominal Pain    (could not give UA) 2-3 weeks has been bothering him. Only on the right side. Last night had some [place] food and made it worse. Was doubled over in pain and vomiting last night. Has not eaten anything today. Stabbing, burning pain. Last night the pain went all the way up through his rib cage and into his chest. Took an antacid last night with no relief. Only relief was with vomiting and heating pad.    For the last 2-3 weeks he has had some sharp pains coming and going at the right abdomen. Yesterday he had chinese food for dinner, and shortly after eating the pain was severe, sharp, caused him to double over.  Burning/sharp/stabbing pain, that radiated up to his central chest.  +nausea and vomiting.  Felt better temporarily after vomiting. Pain is worse after eating, so hasn't eaten anything today. Able to drink fluids without pain or vomiting. Today he is very nauseated, but no vomiting. Pain 6/10 currently.  He took zantac last night, but didn't keep it down (so didn't help). Took zofran last night, which helped. He took tylenol last night for pain, didn't help (didn't really keep it down). Heating pad helped some.  No fever, chills. +diarrhea last night and this morning.  Stools are loose/soft, not watery.  No blood or mucus in the stools. Vomited 3x last night.  No blood, no coffee ground emesis.  Had similar pain in the past, went to ER, did CT, unable to get [place] there.  Pain completely resolved until about 3 weeks ago. CT results reviewed in epic. Hepatic steatosis noted.   last seen 1/3 by Audelia Acton, treated with Augmentin and Hycodan for ear and throat infection. Symptoms resolved.  PMH, PSH, SH reviewed last lipids 02/2017, pravastatin refilled earlier this month, no labs or med check scheduled. He is fasting today.  Prior rash/hives from nexium. Hasn't taken any other PPI's.  Outpatient Encounter Medications as of  09/10/2018  Medication Sig  . aspirin 81 MG EC tablet TAKE 1 TABLET BY MOUTH ONCE DAILY  . lisinopril-hydrochlorothiazide (PRINZIDE,ZESTORETIC) 20-12.5 MG tablet TAKE 1 TABLET BY MOUTH ONCE DAILY  . oxybutynin (DITROPAN) 5 MG tablet TAKE 1 TABLET BY MOUTH ONCE DAILY  . pravastatin (PRAVACHOL) 20 MG tablet TAKE 1 TABLET BY MOUTH ONCE DAILY  . ondansetron (ZOFRAN) 4 MG tablet Take 1 tablet (4 mg total) by mouth every 8 (eight) hours as needed for nausea or vomiting. (Patient not taking: Reported on 08/07/2018)  . [DISCONTINUED] amoxicillin-clavulanate (AUGMENTIN) 875-125 MG tablet Take 1 tablet by mouth 2 (two) times daily.  . [DISCONTINUED] oxyCODONE-acetaminophen (PERCOCET) 5-325 MG tablet Take 1 tablet by mouth every 6 (six) hours as needed for severe pain. (Patient not taking: Reported on 08/07/2018)   No facility-administered encounter medications on file as of 09/10/2018.    Allergies  Allergen Reactions  . Benicar [Olmesartan]     Rash, allergic reaction  . Nexium [Esomeprazole Magnesium]     Rash, allergic reaction   ROS:  No fever, chills, headache, dizziness. No bleeding, bruising, rashes.  No URI symptoms, cough, shortness of breath, chest pain (just last night). No dysuria, hematuria. See HPI  PHYSICAL EXAM:  BP 120/78   Pulse 76   Temp (!) 97.5 F (36.4 C) (Tympanic)   Ht 5\' 9"  (1.753 m)   Wt 226 lb 12.8 oz (102.9 kg)   BMI 33.49 kg/m   Well-appearing male, intermittently very nauseated,  otherwise in no distress  HEENT: conjunctiva and sclera are clear, anicteric. OP clear, moist mucus membranes. Neck: no lymphadenopathy or mass Heart: regular rate and rhythm Lungs: clear bilaterally Back: no CVA or spinal tenderness Abdomen: normal bowel sounds, soft. Tender in RUQ, +Murphy's sign.  No rebound, guarding or mass. nontender in epigastrium.  Extremities: no edema Skin: normal turgor, pink in face, no rash  ASSESSMENT/PLAN:  Right upper quadrant pain - Ddx includes  gallstones, gastritis/GERD. Check labs, [place] - Plan: CBC with Differential/Platelet, Comprehensive metabolic panel, Lipase, US Abdomen Limited RUQ  Nausea and vomiting, intractability of vomiting not specified, unspecified vomiting type - continue zofran prn; discussed hydration, liquid diet and advance as tolerated - Plan: CBC with Differential/Platelet, Comprehensive metabolic panel, Lipase, famotidine (PEPCID) 20 MG tablet  Hyperlipidemia, unspecified hyperlipidemia type - on statin, past due for lipid panel - Plan: Lipid panel   Stay well hydrated by drinking water. Use the zofran to hep with your nausea and prevent further vomiting. Avoid fatty/greasy/fried foods, avoid spicy, citrus and acidic foods. Stick with a bland diet--start with clears and advance as tolerated (chicken noodle soup, can try some toast or a banana if feeling better).  We are going to schedule you for an abdominal ultrasound to look at your gall bladder. In the meantime, take famotidine twice daily to help with any gastritis or reflux symptoms (suggested by the pain radiating up into your chest yesterday). Avoid anti-inflammatories; okay to continue tylenol for pain (but avoid ibuprofen, aleve).

## 2018-09-11 ENCOUNTER — Telehealth: Payer: Self-pay | Admitting: Medical

## 2018-09-11 LAB — LIPID PANEL
CHOLESTEROL TOTAL: 183 mg/dL (ref 100–199)
Chol/HDL Ratio: 3.8 ratio (ref 0.0–5.0)
HDL: 48 mg/dL (ref >39)
LDL Calculated: 104 mg/dL — ABNORMAL HIGH (ref 0–99)
Triglycerides: 156 mg/dL — ABNORMAL HIGH (ref 0–149)
VLDL CHOLESTEROL CAL: 31 mg/dL (ref 5–40)

## 2018-09-11 NOTE — Telephone Encounter (Signed)
Dr. Tomi Bamberger saw him yesterday.  How is he feeling today?  Not sure if Dr. Tomi Bamberger has contacted him about labs.    There were no obvious abnormalities of liver, kidney , lytes, blood counts.   The plan is still to move forward with ultrasound I believe.

## 2018-09-11 NOTE — Telephone Encounter (Signed)
I released his labs to him through [facility] and he had viewed them.  (I sent you copy as FYI)

## 2018-09-11 NOTE — Telephone Encounter (Signed)
Called pt, he said pain still comes and goes.  He said Dr. Tomi Bamberger told him not to eat anything spicy. Advised pt of his labs.

## 2018-09-14 NOTE — Telephone Encounter (Signed)
Called [facility] and they are faxing notes from 10-19.

## 2018-09-14 NOTE — Telephone Encounter (Signed)
Please call orthopedics, Dr. Alfonso Ramus, [facility] for notes.   I sent him to ortho 05/2018 due to hip abnormality on xray, but never got coorespndance back.  I am not sure if patient went to ortho or not, but we made the referral.     He came in recently to see Dr. Tomi Bamberger for acute issues, but I couldn't see where we have gotten an orthopedic note on him since 05/2018 when the abnormality on hip xray was found

## 2018-09-15 ENCOUNTER — Ambulatory Visit
Admission: RE | Admit: 2018-09-15 | Discharge: 2018-09-15 | Disposition: A | Discharge status: Home / Self Care | Payer: BLUE CROSS/BLUE SHIELD | Source: Ambulatory Visit | Attending: Family Medicine | Admitting: Family Medicine

## 2018-09-15 DIAGNOSIS — R1011 Right upper quadrant pain: Secondary | ICD-10-CM

## 2018-10-16 ENCOUNTER — Ambulatory Visit (INDEPENDENT_AMBULATORY_CARE_PROVIDER_SITE_OTHER): Payer: BLUE CROSS/BLUE SHIELD | Admitting: Medical

## 2018-10-16 ENCOUNTER — Encounter: Payer: Self-pay | Admitting: Medical

## 2018-10-16 ENCOUNTER — Other Ambulatory Visit: Payer: Self-pay

## 2018-10-16 VITALS — BP 128/80 | HR 101 | Temp 98.0°F | Resp 16 | Ht 69.0 in | Wt 219.6 lb

## 2018-10-16 DIAGNOSIS — K760 Fatty (change of) liver, not elsewhere classified: Secondary | ICD-10-CM

## 2018-10-16 DIAGNOSIS — M21959 Unspecified acquired deformity of unspecified thigh: Secondary | ICD-10-CM

## 2018-10-16 DIAGNOSIS — Z0000 Encounter for general adult medical examination without abnormal findings: Secondary | ICD-10-CM

## 2018-10-16 DIAGNOSIS — E785 Hyperlipidemia, unspecified: Secondary | ICD-10-CM

## 2018-10-16 DIAGNOSIS — R94120 Abnormal auditory function study: Secondary | ICD-10-CM

## 2018-10-16 DIAGNOSIS — E669 Obesity, unspecified: Secondary | ICD-10-CM

## 2018-10-16 DIAGNOSIS — R1011 Right upper quadrant pain: Secondary | ICD-10-CM

## 2018-10-16 DIAGNOSIS — Z809 Family history of malignant neoplasm, unspecified: Secondary | ICD-10-CM

## 2018-10-16 DIAGNOSIS — Z2821 Immunization not carried out because of patient refusal: Secondary | ICD-10-CM

## 2018-10-16 DIAGNOSIS — R112 Nausea with vomiting, unspecified: Secondary | ICD-10-CM

## 2018-10-16 DIAGNOSIS — Z8249 Family history of ischemic heart disease and other diseases of the circulatory system: Secondary | ICD-10-CM | POA: Diagnosis not present

## 2018-10-16 DIAGNOSIS — Z8547 Personal history of malignant neoplasm of testis: Secondary | ICD-10-CM | POA: Diagnosis not present

## 2018-10-16 DIAGNOSIS — R109 Unspecified abdominal pain: Secondary | ICD-10-CM

## 2018-10-16 DIAGNOSIS — H7392 Unspecified disorder of tympanic membrane, left ear: Secondary | ICD-10-CM

## 2018-10-16 DIAGNOSIS — R61 Generalized hyperhidrosis: Secondary | ICD-10-CM

## 2018-10-16 DIAGNOSIS — I10 Essential (primary) hypertension: Secondary | ICD-10-CM

## 2018-10-16 LAB — POCT URINALYSIS DIP (PROADVANTAGE DEVICE)
Bilirubin, UA: NEGATIVE
Blood, UA: NEGATIVE
Glucose, UA: NEGATIVE mg/dL
Ketones, POC UA: NEGATIVE mg/dL
LEUKOCYTES UA: NEGATIVE
Nitrite, UA: NEGATIVE
Protein Ur, POC: NEGATIVE mg/dL
Specific Gravity, Urine: 1.015
Urobilinogen, Ur: NEGATIVE
pH, UA: 6 (ref 5.0–8.0)

## 2018-10-16 MED ORDER — FAMOTIDINE 20 MG PO TABS: 20.0000 mg | ORAL_TABLET | Freq: Two times a day (BID) | ORAL | 0 refills | Status: DC

## 2018-10-16 MED ORDER — LISINOPRIL-HYDROCHLOROTHIAZIDE 20-12.5 MG PO TABS: 1.0000 | ORAL_TABLET | Freq: Every day | ORAL | 3 refills | Status: DC

## 2018-10-16 MED ORDER — OXYBUTYNIN CHLORIDE 5 MG PO TABS: 5.0000 mg | ORAL_TABLET | Freq: Every day | ORAL | 3 refills | Status: DC

## 2018-10-16 MED ORDER — ASPIRIN 81 MG PO TBEC: 81.0000 mg | DELAYED_RELEASE_TABLET | Freq: Every day | ORAL | 3 refills | Status: DC

## 2018-10-16 MED ORDER — ROSUVASTATIN CALCIUM 10 MG PO TABS: 10.0000 mg | ORAL_TABLET | Freq: Every day | ORAL | 3 refills | Status: DC

## 2018-10-16 MED ORDER — AMOXICILLIN 875 MG PO TABS: 875.0000 mg | ORAL_TABLET | Freq: Two times a day (BID) | ORAL | 0 refills | Status: DC

## 2018-10-16 NOTE — Patient Instructions (Signed)
Thanks for trusting [place] with your health care and for coming in for a physical today.  Below are some general recommendations I have for you:  Yearly screenings See your eye doctor yearly for routine vision care. See your dentist yearly for routine dental care including hygiene visits twice yearly. See me here yearly for a routine physical and preventative care visit   Specific Concerns today:  . Continue efforts to lose weight . We will schedule you for a HIDA scan to check the gallbladder further . Eat a healthy low-fat diet such as a Pescetarian diet . I changed you to Crestor today, so stop Pravachol once you start Crestor   Please follow up yearly for a physical.    Fatty Liver Disease  Fatty liver disease occurs when too much fat has built up in your liver cells. Fatty liver disease is also called hepatic steatosis or steatohepatitis. The liver removes harmful substances from your bloodstream and produces fluids that your body needs. It also helps your body use and store energy from the food you eat. In many cases, fatty liver disease does not cause symptoms or problems. It is often diagnosed when tests are being done for other reasons. However, over time, fatty liver can cause inflammation that may lead to more serious liver problems, such as scarring of the liver (cirrhosis) and liver failure. Fatty liver is associated with insulin resistance, increased body fat, high blood pressure (hypertension), and high cholesterol. These are features of metabolic syndrome and increase your risk for stroke, diabetes, and heart disease. What are the causes? This condition may be caused by:  Drinking too much alcohol.  Poor nutrition.  Obesity.  Cushing's syndrome.  Diabetes.  High cholesterol.  Certain drugs.  Poisons.  Some viral infections.  Pregnancy. What increases the risk? You are more likely to develop this condition if you:  Abuse alcohol.  Are overweight.   Have diabetes.  Have hepatitis.  Have a high triglyceride level.  Are pregnant. What are the signs or symptoms? Fatty liver disease often does not cause symptoms. If symptoms do develop, they can include:  Fatigue.  Weakness.  Weight loss.  Confusion.  Abdominal pain.  Nausea and vomiting.  Yellowing of your skin and the white parts of your eyes (jaundice).  Itchy skin. How is this diagnosed? This condition may be diagnosed by:  A physical exam and medical history.  Blood tests.  Imaging tests, such as an ultrasound, CT scan, or MRI.  A liver biopsy. A small sample of liver tissue is removed using a needle. The sample is then looked at under a microscope. How is this treated? Fatty liver disease is often caused by other health conditions. Treatment for fatty liver may involve medicines and lifestyle changes to manage conditions such as:  Alcoholism.  High cholesterol.  Diabetes.  Being overweight or obese. Follow these instructions at home:   Do not drink alcohol. If you have trouble quitting, ask your health care provider how to safely quit with the help of medicine or a supervised program. This is important to keep your condition from getting worse.  Eat a healthy diet as told by your health care provider. Ask your health care provider about working with a diet and nutrition specialist (dietitian) to develop an eating plan.  Exercise regularly. This can help you lose weight and control your cholesterol and diabetes. Talk to your health care provider about an exercise plan and which activities are best for you.  Take  over-the-counter and prescription medicines only as told by your health care provider.  Keep all follow-up visits as told by your health care provider. This is important. Contact a health care provider if: You have trouble controlling your:  Blood sugar. This is especially important if you have diabetes.  Cholesterol.  Drinking of alcohol.  Get help right away if:  You have abdominal pain.  You have jaundice.  You have nausea and vomiting.  You vomit blood or material that looks like coffee grounds.  You have stools that are black, tar-like, or bloody. Summary  Fatty liver disease develops when too much fat builds up in the cells of your liver.  Fatty liver disease often causes no symptoms or problems. However, over time, fatty liver can cause inflammation that may lead to more serious liver problems, such as scarring of the liver (cirrhosis).  You are more likely to develop this condition if you abuse alcohol, are pregnant, are overweight, have diabetes, have hepatitis, or have high triglyceride levels.  Contact your health care provider if you have trouble controlling your weight, blood sugar, cholesterol, or drinking of alcohol. This information is not intended to replace advice given to you by your health care provider. Make sure you discuss any questions you have with your health care provider. Document Released: 09/06/2005 Document Revised: 04/30/2017 Document Reviewed: 04/30/2017 Elsevier Interactive Patient Education  2019 [employer].     Gallbladder Nuclear Scan  A gallbladder nuclear scan (hepatobiliary scan or HIDA scan) is an imaging test that checks the function of your liver, gallbladder, and the ducts of the liver and gallbladder. These parts make up the hepatobiliary system. You may need this scan if you have symptoms of liver or gallbladder disease. For this exam, a radioactive substance (radiotracer) will be injected into your bloodstream through an IV. As the radiotracer moves through your hepatobiliary system, a camera will detect the energy that the radiotracer gives off. The camera will produce images that show how well your hepatobiliary system is functioning. Tell a health care provider about:  Any allergies you have.  All medicines you are taking, including vitamins, herbs, eye drops,  creams, and over-the-counter medicines.  Any problems you or your family members have had with anesthetic medicines.  Any blood disorders you have.  Any surgeries you have had.  Any medical conditions you have.  Whether you are pregnant, may be pregnant, or are breastfeeding. What are the risks?  Getting a gallbladder nuclear scan is a safe procedure. However, you will be exposed to a small amount of radiation.  There is a risk of an allergic reaction to medicines used during the test. What happens before the procedure? General instructions  Follow instructions from your health care provider about eating or drinking restrictions. You may not be able to eat or drink anything for 4 hours before the test.  Ask your health care provider about changing or stopping your regular medicines. What happens during the procedure?   You will lie down on your back.  An IV will be inserted into a vein in your hand or arm.  Radiotracer will be injected through your IV. You may feel a cold sensation as this happens.  A camera (gamma camera) will be moved over your abdomen.  Images will be taken. The camera may rotate around your body. You may be asked to stay still or move into a certain position.  You may be given a medicine (cholecystokinin, CCK) through your IV. This medicine  will make your gallbladder empty. You may feel some nausea or have cramping for a short time.  More images will be taken.  After all images have been taken, your IV will be removed. The procedure may vary among health care providers and hospitals. What happens after the procedure?  You should drink plenty of fluids to help your body get rid of the radiotracer.  It is up to you to get your test results. Ask your health care provider, or the department that is doing the test, when your results will be ready. Summary  A gallbladder nuclear scan (hepatobiliary scan or HIDA scan) is an imaging test that checks the  function of your liver, gallbladder, and the ducts of the liver and gallbladder.  You may need this scan if you have symptoms of liver or gallbladder disease.  For this exam, a radioactive substance (radiotracer) will be injected into your bloodstream through an IV.  The camera will produce images that show how well your hepatobiliary system is functioning. This information is not intended to replace advice given to you by your health care provider. Make sure you discuss any questions you have with your health care provider. Document Released: 07/19/2000 Document Revised: 08/01/2017 Document Reviewed: 08/01/2017 Elsevier Interactive Patient Education  2019 [place] for Adults - Male      [place]:  A routine yearly physical is a good way to check in with your primary care provider about your health and preventive screening. It is also an opportunity to share updates about your health and any concerns you have, and receive a thorough all-over exam.   Most health insurance companies pay for at least some preventative services.  Check with your health plan for specific coverages.  WHAT PREVENTATIVE SERVICES DO MEN NEED?  Adult men should have their weight and blood pressure checked regularly.   Men age 6 and older should have their cholesterol levels checked regularly.  Beginning at age 60 and continuing to age 74, men should be screened for colorectal cancer.  Certain people may need continued testing until age 84.  Updating vaccinations is part of preventative care.  Vaccinations help protect against diseases such as the flu.  Osteoporosis is a disease in which the bones lose minerals and strength as we age. Men ages 68 and over should discuss this with their caregivers  Lab tests are generally done as part of preventative care to screen for anemia and blood disorders, to screen for problems with the kidneys and liver, to screen for  bladder problems, to check blood sugar, and to check your cholesterol level.  Preventative services generally include counseling about diet, exercise, avoiding tobacco, drugs, excessive alcohol consumption, and sexually transmitted infections.    GENERAL RECOMMENDATIONS FOR GOOD HEALTH:  Healthy diet:  Eat a variety of foods, including fruit, vegetables, animal or vegetable protein, such as meat, fish, chicken, and eggs, or beans, lentils, tofu, and grains, such as rice.  Drink plenty of water daily.  Decrease saturated fat in the diet, avoid lots of red meat, processed foods, sweets, fast foods, and fried foods.  Exercise:  Aerobic exercise helps maintain good heart health. At least 30-40 minutes of moderate-intensity exercise is recommended. For example, a brisk walk that increases your heart rate and breathing. This should be done on most days of the week.   Find a type of exercise or a variety of exercises that you enjoy so that it becomes  a part of your daily life.  Examples are running, walking, swimming, water aerobics, and biking.  For motivation and support, explore group exercise such as aerobic class, spin class, Zumba, Yoga,or  martial arts, etc.    Set exercise goals for yourself, such as a certain weight goal, walk or run in a race such as a 5k walk/run.  Speak to your primary care provider about exercise goals.  Disease prevention:  If you smoke or chew tobacco, find out from your caregiver how to quit. It can literally save your life, no matter how long you have been a tobacco user. If you do not use tobacco, never begin.   Maintain a healthy diet and normal weight. Increased weight leads to problems with blood pressure and diabetes.   The Body Mass Index or BMI is a way of measuring how much of your body is fat. Having a BMI above 27 increases the risk of heart disease, diabetes, hypertension, stroke and other problems related to obesity. Your caregiver can help determine  your BMI and based on it develop an exercise and dietary program to help you achieve or maintain this important measurement at a healthful level.  High blood pressure causes heart and blood vessel problems.  Persistent high blood pressure should be treated with medicine if weight loss and exercise do not work.   Fat and cholesterol leaves deposits in your arteries that can block them. This causes heart disease and vessel disease elsewhere in your body.  If your cholesterol is found to be high, or if you have heart disease or certain other medical conditions, then you may need to have your cholesterol monitored frequently and be treated with medication.   Ask if you should have a cardiac stress test if your history suggests this. A stress test is a test done on a treadmill that looks for heart disease. This test can find disease prior to there being a problem.  Osteoporosis is a disease in which the bones lose minerals and strength as we age. This can result in serious bone fractures. Risk of osteoporosis can be identified using a bone density scan. Men ages 97 and over should discuss this with their caregivers. Ask your caregiver whether you should be taking a calcium supplement and Vitamin D, to reduce the rate of osteoporosis.   Avoid drinking alcohol in excess (more than two drinks per day).  Avoid use of street drugs. Do not share needles with anyone. Ask for professional help if you need assistance or instructions on stopping the use of alcohol, cigarettes, and/or drugs.  Brush your teeth twice a day with fluoride toothpaste, and floss once a day. Good oral hygiene prevents tooth decay and gum disease. The problems can be painful, unattractive, and can cause other health problems. Visit your dentist for a routine oral and dental check up and preventive care every 6-12 months.   Look at your skin regularly.  Use a mirror to look at your back. Notify your caregivers of changes in moles, especially  if there are changes in shapes, colors, a size larger than a pencil eraser, an irregular border, or development of new moles.  Safety:  Use seatbelts 100% of the time, whether driving or as a passenger.  Use safety devices such as hearing protection if you work in environments with loud noise or significant background noise.  Use safety glasses when doing any work that could send debris in to the eyes.  Use a helmet if you ride  a bike or motorcycle.  Use appropriate safety gear for contact sports.  Talk to your caregiver about gun safety.  Use sunscreen with a SPF (or skin protection factor) of 15 or greater.  Lighter skinned people are at a greater risk of skin cancer. Don't forget to also wear sunglasses in order to protect your eyes from too much damaging sunlight. Damaging sunlight can accelerate cataract formation.   Practice safe sex. Use condoms. Condoms are used for birth control and to help reduce the spread of sexually transmitted infections (or STIs).  Some of the STIs are gonorrhea (the clap), chlamydia, syphilis, trichomonas, herpes, HPV (human papilloma virus) and HIV (human immunodeficiency virus) which causes AIDS. The herpes, HIV and HPV are viral illnesses that have no cure. These can result in disability, cancer and death.   Keep carbon monoxide and smoke detectors in your home functioning at all times. Change the batteries every 6 months or use a model that plugs into the wall.   Vaccinations:  Stay up to date with your tetanus shots and other required immunizations. You should have a booster for tetanus every 10 years. Be sure to get your flu shot every year, since 5%-20% of the [place] population comes down with the flu. The flu vaccine changes each year, so being vaccinated once is not enough. Get your shot in the fall, before the flu season peaks.   Other vaccines to consider:  Human Papilloma Virus or HPV causes cancer of the cervix, and other infections that can be transmitted  from person to person. There is a vaccine for HPV, and males should get immunized between the ages of 85 and 8. It requires a series of 3 shots.   Pneumococcal vaccine to protect against certain types of pneumonia.  This is normally recommended for adults age 77 or older.  However, adults younger than 45 years old with certain underlying conditions such as diabetes, heart or lung disease should also receive the vaccine.  Shingles vaccine to protect against Varicella Zoster if you are older than age 66, or younger than 45 years old with certain underlying illness.  If you have not had the Shingrix vaccine, please call your insurer to inquire about coverage for the Shingrix vaccine given in 2 doses.   Some insurers cover this vaccine after age 43, some cover this after age 50.  If your insurer covers this, then call to schedule appointment to have this vaccine here  Hepatitis A vaccine to protect against a form of infection of the liver by a virus acquired from food.  Hepatitis B vaccine to protect against a form of infection of the liver by a virus acquired from blood or body fluids, particularly if you work in health care.  If you plan to travel internationally, check with your local health department for specific vaccination recommendations.   What should I know about cancer screening? Many types of cancers can be detected early and may often be prevented. Lung Cancer  You should be screened every year for lung cancer if: ? You are a current smoker who has smoked for at least 30 years. ? You are a former smoker who has quit within the past 15 years.  Talk to your health care provider about your screening options, when you should start screening, and how often you should be screened.  Colorectal Cancer  Routine colorectal cancer screening usually begins at 45 years of age and should be repeated every 5-10 years until you are 45  years old. You may need to be screened more often if early  forms of precancerous polyps or small growths are found. Your health care provider may recommend screening at an earlier age if you have risk factors for colon cancer.  Your health care provider may recommend using home test kits to check for hidden blood in the stool.  A small camera at the end of a tube can be used to examine your colon (sigmoidoscopy or colonoscopy). This checks for the earliest forms of colorectal cancer.  Prostate and Testicular Cancer  Depending on your age and overall health, your health care provider may do certain tests to screen for prostate and testicular cancer.  Talk to your health care provider about any symptoms or concerns you have about testicular or prostate cancer.  Skin Cancer  Check your skin from head to toe regularly.  Tell your health care provider about any new moles or changes in moles, especially if: ? There is a change in a mole's size, shape, or color. ? You have a mole that is larger than a pencil eraser.  Always use sunscreen. Apply sunscreen liberally and repeat throughout the day.  Protect yourself by wearing long sleeves, pants, a wide-brimmed hat, and sunglasses when outside.

## 2018-10-16 NOTE — Progress Notes (Signed)
Subjective:   HPI  Scott Moss is a 43 y.o. male who presents for Chief Complaint  Patient presents with  . CPE    fasting CPE  ortho [facility]    Patient Care Team: Tysinger, Leward Quan as PCP - General (Family Medicine) Sees dentist Sees eye doctor Dr. Percell Miller at [facility]  Concerns: Still getting some intermittent right sided abdominal pain.   Pain is worse after eating.  Within 5 minutes of eating, is on the toilet, having some loose stools.  No blood in stool.   No nausea or vomiting.  Is getting some heartburn and belching.    Still taking the Pepcid.  Regarding prior abnormal hip x-ray and hip findings on CT, he spoke to orthopedics about this and he is confused that they felt like it was not an issue.  He still sees him for back pain.  Reviewed their medical, surgical, family, social, medication, and allergy history and updated chart as appropriate.  Past Medical History:  Diagnosis Date  . Arthritis   . Atypical chest pain 07/06/2015   Non-obstructive CAD on [facility] 04/2010.  [name] Chondromalacia of left knee 09/2012  . Complication of anesthesia   . Family history of cancer    multiple family members, multiple types.   Sees Dr. Marin Olp  . Family history of cancer   . Family history of premature CAD   . H/O cardiovascular stress test 07/14/2015   myocardial perfusion scan, normal, EF 45-54% mildy reduced LV function, Dr. Oval Linsey  . H/O echocardiogram 07/18/2015   LV mild hypertrophy, 55-60%EF, Dr. Oval Linsey  . High cholesterol   . Hypertension   . Obesity   . Overweight   . PONV (postoperative nausea and vomiting)   . Testicular cancer ([place]) 02/2005   Dr. Marin Olp    Past Surgical History:  Procedure Laterality Date  . ANTERIOR CERVICAL DECOMP/DISCECTOMY FUSION  11/11/2008   C5-6  . APPENDECTOMY    . CARDIAC CATHETERIZATION  04/13/2010   no disease  . CERVICAL SPINE SURGERY  2015   Dr. Deri Fuelling  . COLONOSCOPY     prior, no problems per  patient, year unknown, possibly in early 5s.  [name] KNEE ARTHROSCOPY Right   . KNEE ARTHROSCOPY Left 10/19/2015   Procedure: LEFT KNEE SCOPE CHONDROPLASTY;  Surgeon: [name], MD;  Location: [place];  Service: Orthopedics;  Laterality: Left;  . KNEE ARTHROSCOPY WITH LATERAL RELEASE Left 09/18/2012   Procedure: KNEE ARTHROSCOPY WITH LATERAL RELEASE;  Surgeon: [name], MD;  Location: [place];  Service: Orthopedics;  Laterality: Left;  WITH DEBRIDEMENT AND SHAVING(CHONDROPLASTY), Excision Medial [place]  . NASAL SINUS SURGERY  2014  . PARTIAL KNEE ARTHROPLASTY Left 08/15/2016   Procedure: PATELLA FEMORAL REPLACEMENT LEFT KNEE;  Surgeon: [name], MD;  Location: [place];  Service: Orthopedics;  Laterality: Left;  . RADICAL ORCHIECTOMY  02/07/2005   left; with left testicular implant    Social History   Socioeconomic History  . Marital status: Married    Spouse name: Not on file  . Number of children: 2  . Years of education: Not on file  . Highest education level: Not on file  Occupational History  . Occupation: truck Animator Needs  . Financial resource strain: Not on file  . Food insecurity:    Worry: Not on file    Inability: Not on file  . Transportation needs:    Medical: Not  on file    Non-medical: Not on file  Tobacco Use  . Smoking status: Never Smoker  . Smokeless tobacco: Never Used  . Tobacco comment: never used tobacco  Substance and Sexual Activity  . Alcohol use: Yes    Alcohol/week: 3.0 standard drinks    Types: 3 Cans of beer per week    Comment: social  . Drug use: No  . Sexual activity: Yes  Lifestyle  . Physical activity:    Days per week: Not on file    Minutes per session: Not on file  . Stress: Not on file  Relationships  . Social connections:    Talks on phone: Not on file    Gets together: Not on file    Attends religious service: Not on file    Active member of club or  organization: Not on file    Attends meetings of clubs or organizations: Not on file    Relationship status: Not on file  . Intimate partner violence:    Fear of current or ex partner: Not on file    Emotionally abused: Not on file    Physically abused: Not on file    Forced sexual activity: Not on file  Other Topics Concern  . 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   Exercise - not much as of 03/2016       Family History  Problem Relation Age of Onset  . Ovarian cancer Mother        uterine, mets  . Hypertension Mother   . Stroke Mother   . Migraines Mother   . Cancer Mother   . Lung cancer Father   . Hypertension Father   . Heart attack Father 36  . Skin cancer Father        ?  [name] Heart disease Father   . Cancer Father        lung  . Cancer Sister 50       multiple myeloma  . Hypertension Sister   . Migraines Sister   . Diabetes Maternal Uncle   . Colon cancer Maternal Uncle      Current Outpatient Medications:  .  aspirin 81 MG EC tablet, Take 1 tablet (81 mg total) by mouth daily. Swallow whole., Disp: 90 tablet, Rfl: 3 .  lisinopril-hydrochlorothiazide (PRINZIDE,ZESTORETIC) 20-12.5 MG tablet, Take 1 tablet by mouth daily., Disp: 90 tablet, Rfl: 3 .  oxybutynin (DITROPAN) 5 MG tablet, Take 1 tablet (5 mg total) by mouth daily., Disp: 90 tablet, Rfl: 3 .  amoxicillin (AMOXIL) 875 MG tablet, Take 1 tablet (875 mg total) by mouth 2 (two) times daily., Disp: 20 tablet, Rfl: 0 .  famotidine (PEPCID) 20 MG tablet, Take 1 tablet (20 mg total) by mouth 2 (two) times daily., Disp: 60 tablet, Rfl: 0 .  rosuvastatin (CRESTOR) 10 MG tablet, Take 1 tablet (10 mg total) by mouth daily., Disp: 90 tablet, Rfl: 3  Allergies  Allergen Reactions  . Benicar [Olmesartan]     Rash, allergic reaction  . Nexium [Esomeprazole Magnesium]     Rash, allergic reaction     Review of Systems Constitutional: -fever, -chills, -sweats, -unexpected weight change, -decreased appetite, -fatigue Allergy: -sneezing, -itching, -congestion Dermatology: -changing moles, --rash, -lumps ENT: -runny nose, -ear pain, -sore throat, -hoarseness, -sinus pain, -teeth pain, - ringing in ears, -hearing loss, -nosebleeds Cardiology: -chest  pain, -palpitations, -swelling, -difficulty breathing when lying flat, -waking up short of breath Respiratory: -cough, -shortness of breath, -difficulty breathing with exercise or exertion, -wheezing, -coughing up blood Gastroenterology: +abdominal pain, -nausea, -vomiting, -diarrhea, -constipation, -blood in stool, -changes in bowel movement, -difficulty swallowing or eating Hematology: -bleeding, -bruising  Musculoskeletal: -joint aches, -muscle aches, -joint swelling, -back pain, -neck pain, -cramping, -changes in gait Ophthalmology: denies vision changes, eye redness, itching, discharge Urology: -burning with urination, -difficulty urinating, -blood in urine, -urinary frequency, -urgency, -incontinence Neurology: -headache, -weakness, -tingling, -numbness, -memory loss, -falls, -dizziness Psychology: -depressed mood, -agitation, -sleep problems Male GU: no testicular mass, pain, no lymph nodes swollen, no swelling, no rash.     Objective:  BP 128/80   Pulse (!) 101   Temp 98 F (36.7 C) (Oral)   Resp 16   Ht _0  (1.753 m)   Wt 219 lb 9.6 oz (99.6 kg)   SpO2 97%   BMI 32.43 kg/m   General appearance: alert, no distress, WD/WN, Caucasian male Skin: several tattoos, right chest , across abdomen, left chest, bilat lower legs, no worrisome skin findings HEENT: normocephalic, conjunctiva/corneas normal, sclerae anicteric, PERRLA, EOMi, nares patent, no discharge or erythema, pharynx normal Oral cavity: MMM, tongue normal, teeth normal Neck: faint anterior neck surgical scar, supple, no lymphadenopathy, no thyromegaly, no masses,  normal ROM, no bruits Chest: non tender, normal shape and expansion Heart: RRR, normal S1, S2, no murmurs Lungs: CTA bilaterally, no wheezes, rhonchi, or rales Abdomen: +bs, soft, non tender, non distended, no masses, no hepatomegaly, no splenomegaly, no bruits Back: non tender, normal ROM, no scoliosis Musculoskeletal: right knee surgical scar, left knee surgical linear scar,  upper extremities non tender, no obvious deformity, normal ROM throughout, lower extremities non tender, no obvious deformity, normal ROM throughout Extremities: no edema, no cyanosis, no clubbing Pulses: 2+ symmetric, upper and lower extremities, normal cap refill Neurological: alert, oriented x 3, CN2-12 intact, strength normal upper extremities and lower extremities, sensation normal throughout, DTRs 2+ throughout, no cerebellar signs, gait normal Psychiatric: normal affect, behavior normal, pleasant  GU: normal male external genitalia,circumcised, left testicular prosthesis, nontender, no masses, no hernia, no lymphadenopathy Rectal: deferred   Assessment and Plan :   Encounter Diagnoses  Name Primary?  . Encounter for health maintenance examination in adult Yes  . Family history of premature CAD   . Family history of cancer   . H/O testicular cancer   . Hyperlipidemia, unspecified hyperlipidemia type   . Obesity with serious comorbidity, unspecified classification, unspecified obesity type   . Essential hypertension   . Abnormal tympanic membrane of left ear   . Hyperhidrosis   . Abdominal pain, unspecified abdominal location   . Influenza vaccination declined   . Fatty liver   . Abnormal hearing screen   . Deformity of hip joint, unspecified laterality   . Nausea and vomiting, intractability of vomiting not specified, unspecified vomiting type   . Right upper quadrant pain     Physical exam - discussed and counseled on healthy lifestyle, diet, exercise, preventative care, vaccinations, sick and well  care, proper use of emergency dept and after hours care, and addressed their concerns.    Health screening: See your eye doctor yearly for routine vision care. See your dentist yearly for routine dental care including hygiene visits twice yearly.  Cancer screening Advised monthly self testicular exam   Vaccinations: Advised yearly influenza vaccine Patient declines influenza vaccine  Up to date on Td  Acute issues  discussed: Abdominal pain ongoing - HIDA scan  Separate significant chronic issues discussed: I reviewed the recent labs in his chart for CMET, CBC, Lipid  Abnormal hip findings - f/u with orthopedics  HTN - c/t same medication  hyperlipidemia - change to Crestor for better efficacy, discussed risks/benefits  Obesity - glad to see he has lost weight, encouraged to continue efforts  Fatty liver disease - discussed findings, recommendations with weight loss, lifestyle  Ears still look tense with effusion - begin round of Amoxicillin    M[place] was seen today for cpe.  Diagnoses and all orders for this visit:  Encounter for health maintenance examination in adult -     POCT Urinalysis DIP (Proadvantage Device)  Family history of premature CAD  Family history of cancer  H/O testicular cancer  Hyperlipidemia, unspecified hyperlipidemia type  Obesity with serious comorbidity, unspecified classification, unspecified obesity type  Essential hypertension  Abnormal tympanic membrane of left ear  Hyperhidrosis  Abdominal pain, unspecified abdominal location  Influenza vaccination declined  Fatty liver  Abnormal hearing screen  Deformity of hip joint, unspecified laterality  Nausea and vomiting, intractability of vomiting not specified, unspecified vomiting type Comments: continue zofran prn; discussed hydration, liquid diet and advance as tolerated Orders: -     famotidine (PEPCID) 20 MG tablet; Take 1 tablet (20 mg total) by mouth 2 (two) times  daily.  Right upper quadrant pain -     NM Hepato W/Eject Fract; Future  Other orders -     rosuvastatin (CRESTOR) 10 MG tablet; Take 1 tablet (10 mg total) by mouth daily. -     oxybutynin (DITROPAN) 5 MG tablet; Take 1 tablet (5 mg total) by mouth daily. -     lisinopril-hydrochlorothiazide (PRINZIDE,ZESTORETIC) 20-12.5 MG tablet; Take 1 tablet by mouth daily. -     aspirin 81 MG EC tablet; Take 1 tablet (81 mg total) by mouth daily. Swallow whole. -     amoxicillin (AMOXIL) 875 MG tablet; Take 1 tablet (875 mg total) by mouth 2 (two) times daily.    Follow-up pending labs, yearly for physical

## 2018-10-26 ENCOUNTER — Telehealth (INDEPENDENT_AMBULATORY_CARE_PROVIDER_SITE_OTHER): Payer: BLUE CROSS/BLUE SHIELD | Admitting: Medical

## 2018-10-26 ENCOUNTER — Other Ambulatory Visit: Payer: Self-pay

## 2018-10-26 ENCOUNTER — Encounter: Payer: Self-pay | Admitting: Medical

## 2018-10-26 DIAGNOSIS — R52 Pain, unspecified: Secondary | ICD-10-CM | POA: Diagnosis not present

## 2018-10-26 DIAGNOSIS — B349 Viral infection, unspecified: Secondary | ICD-10-CM | POA: Diagnosis not present

## 2018-10-26 DIAGNOSIS — R05 Cough: Secondary | ICD-10-CM | POA: Diagnosis not present

## 2018-10-26 DIAGNOSIS — R059 Cough, unspecified: Secondary | ICD-10-CM

## 2018-10-26 NOTE — Progress Notes (Signed)
Documentation for Telephone encounter:  This telephone service is not related to other E/M service within previous 7 days.  Patient consented to the consult.  This telephone consult involved patient and myself, Dorothea Ogle PA-C.  Subjective: Started today with feeling bad.  Yesterday had headache.   Wondered about allergies.   Head pounding today with headache.  Ears feels stuffy.   Has mild burning, using salt water gargles.  Occasional cough from drainage down back of throat.  Has some mild SOB if activity, but not at rest.   98.6 temp today.   No nausea, no vomiting, no diarrhea.   Has some mild chills.  No other symptoms.  Still on antibiotics given last visit for ear infection.   No sick contacts, no known Coronavirus contact.   No recent travel.     Objective: On the phone sounds stuffy, nasal stuffiness.    Assessment: Encounter Diagnoses  Name Primary?  . Viral syndrome Yes  . Cough   . Body aches     Plan: We discussed symptoms that suggest viral syndrome.   discussed treatment, prevention, self quarantine, and discussed symptoms that would prompt call back or recheck.   Your symptoms per our phone consult today suggest a viral syndrome, viral respiratory tract infection.  Recommendations 1- You should rest as you are doing the next several days 2- Make sure you are hydrating well with clear fluids such as water, G2 Gatorade, soup broth and other clear fluids 3 -You can use Benadryl or other allergy medication such as Zyrtec daily for the next week.  This will help with drainage and congestion.  Given that this is allergy season though, you may want to continue allergy medicine daily for the next several weeks 4- If you develop more of a cough you can use Delsym cough suppressant over-the-counter or Robitussin-DM for cough and congestion 5- If you are significantly worse in the coming days such as fever over 101, vomiting, shortness of breath, then either call or go to  the triage tent at [facility] emergency department 6-we recommend you quarantine yourself off at home to prevent spreading your illness to your work contacts or your family.  Wash your hands regularly with soap and water for 20 seconds or more, cough into your elbow or if he had a mask at home wear a mask when interacting in the kitchen or other parts of the house other people may be located 7-the current recommendations are to remain at home until you have 72 hours of no symptoms.  Your current illness would likely last the next 3 to 5 days so theoretically you should not be back at work this week at all  I hope you get to feeling better soon!  Work note will be faxed.     They were advised to follow up if not improving in the next 3-4 days   Time involving medical discussion was 10 minutes.

## 2018-11-15 ENCOUNTER — Other Ambulatory Visit: Payer: Self-pay | Admitting: Medical

## 2018-11-16 NOTE — Telephone Encounter (Signed)
Is this okay to refill? 

## 2019-02-14 ENCOUNTER — Other Ambulatory Visit: Payer: Self-pay | Admitting: Medical

## 2019-02-14 DIAGNOSIS — R112 Nausea with vomiting, unspecified: Secondary | ICD-10-CM

## 2019-02-28 ENCOUNTER — Other Ambulatory Visit: Payer: Self-pay | Admitting: Medical

## 2019-04-06 ENCOUNTER — Other Ambulatory Visit: Payer: Self-pay | Admitting: Medical

## 2019-04-06 DIAGNOSIS — R112 Nausea with vomiting, unspecified: Secondary | ICD-10-CM

## 2019-04-26 ENCOUNTER — Ambulatory Visit: Payer: BC Managed Care – PPO | Admitting: Medical

## 2019-04-26 ENCOUNTER — Encounter: Payer: Self-pay | Admitting: Medical

## 2019-04-26 ENCOUNTER — Other Ambulatory Visit: Payer: Self-pay

## 2019-04-26 VITALS — BP 120/78 | HR 87 | Temp 97.7°F | Ht 69.0 in | Wt 230.8 lb

## 2019-04-26 DIAGNOSIS — N419 Inflammatory disease of prostate, unspecified: Secondary | ICD-10-CM | POA: Diagnosis not present

## 2019-04-26 DIAGNOSIS — R361 Hematospermia: Secondary | ICD-10-CM | POA: Diagnosis not present

## 2019-04-26 DIAGNOSIS — R1030 Lower abdominal pain, unspecified: Secondary | ICD-10-CM

## 2019-04-26 LAB — POCT URINALYSIS DIP (PROADVANTAGE DEVICE)
Bilirubin, UA: NEGATIVE
Blood, UA: NEGATIVE
Glucose, UA: NEGATIVE mg/dL
Ketones, POC UA: NEGATIVE mg/dL
Leukocytes, UA: NEGATIVE
Nitrite, UA: NEGATIVE
Protein Ur, POC: NEGATIVE mg/dL
Specific Gravity, Urine: 1.025
Urobilinogen, Ur: NEGATIVE
pH, UA: 6 (ref 5.0–8.0)

## 2019-04-26 MED ORDER — CIPROFLOXACIN HCL 500 MG PO TABS: 500.0000 mg | ORAL_TABLET | Freq: Two times a day (BID) | ORAL | 0 refills | Status: DC

## 2019-04-26 NOTE — Progress Notes (Signed)
Subjective:  Scott Moss is a 43 y.o. male who presents for Chief Complaint  Patient presents with  . Groin Pain    x1 month off and on-worse      Here for groin pain.  Started about a month ago and has continued to get worse over the last month.  He denies injury or trauma.  He denies testicle swelling.  He does have pain in the perineal area and generalized groin pain.  He does notes lower belly pain.  No back pain.  No fever.[name]  He denies burning with urination, no urinary frequency, but does have dribbling, decreased urine stream.  He denies blood in urine.  He notes 2 episodes of blood in the semen in the last month.  He has a history of testicular cancer on the left and has an implant there but no recent change or nodule noted in the right testicle.  Denies history of prostate infection or BPH.  He has felt nauseated at times with this pain.  Using Aleve and Tylenol to help with the pain.  Hurts to sit and walk.  No other aggravating or relieving factors.  No other c/o.  The following portions of the patient's history were reviewed and updated as appropriate: allergies, current medications, past family history, past medical history, past social history, past surgical history and problem list.  ROS Otherwise as in subjective above   Past Medical History:  Diagnosis Date  . Arthritis   . Atypical chest pain 07/06/2015   Non-obstructive CAD on [facility] 04/2010.  [name] Chondromalacia of left knee 09/2012  . Complication of anesthesia   . Family history of cancer    multiple family members, multiple types.   Sees Dr. Marin Olp  . Family history of cancer   . Family history of premature CAD   . H/O cardiovascular stress test 07/14/2015   myocardial perfusion scan, normal, EF 45-54% mildy reduced LV function, Dr. Oval Linsey  . H/O echocardiogram 07/18/2015   LV mild hypertrophy, 55-60%EF, Dr. Oval Linsey  . High cholesterol   . Hypertension   . Obesity   . Overweight   . PONV (postoperative nausea and  vomiting)   . Testicular cancer ([place]) 02/2005   Dr. Marin Olp   Current Outpatient Medications on File Prior to Visit  Medication Sig Dispense Refill  . ASPIRIN LOW DOSE 81 MG EC tablet TAKE 1 TABLET(81 MG) BY MOUTH DAILY. SWALLOW WHOLE 90 tablet 1  . famotidine (PEPCID) 20 MG tablet TAKE 1 TABLET(20 MG) BY MOUTH TWICE DAILY 60 tablet 0  . lisinopril-hydrochlorothiazide (PRINZIDE,ZESTORETIC) 20-12.5 MG tablet Take 1 tablet by mouth daily. 90 tablet 3  . rosuvastatin (CRESTOR) 10 MG tablet Take 1 tablet (10 mg total) by mouth daily. 90 tablet 3  . amoxicillin (AMOXIL) 875 MG tablet Take 1 tablet (875 mg total) by mouth 2 (two) times daily. (Patient not taking: Reported on 04/26/2019) 20 tablet 0  . oxybutynin (DITROPAN) 5 MG tablet Take 1 tablet (5 mg total) by mouth daily. (Patient not taking: Reported on 04/26/2019) 90 tablet 3   No current facility-administered medications on file prior to visit.     Objective: BP 120/78   Pulse 87   Temp 97.7 F (36.5 C)   Ht 5\' 9"  (1.753 m)   Wt 230 lb 12.8 oz (104.7 kg)   SpO2 98%   BMI 34.08 kg/m   General appearance: alert, no distress, well developed, well nourished Abdomen: +bs, soft, non tender, non distended, no masses, no  hepatomegaly, no splenomegaly GU: normal male, circ, left testicular implant, no obvious mass or abnormality on the right, no swelling DRE - declined    Assessment: Encounter Diagnoses  Name Primary?  . Prostatitis, unspecified prostatitis type Yes  . Inguinal pain, unspecified laterality   . Hematospermia      Plan: Discussed possible causes, I suspect prostatitis.  Begin Cipro, discussed pros and cons of medication, discussed timeframe to see improvements, hydrate well, use relative rest.  Can use over-the-counter NSAID or Tylenol for pain and inflammation.  If not improving the next week then let me know, otherwise plan to follow-up in 1 month and recheck including PSA  Jann was seen today for groin  pain.  Diagnoses and all orders for this visit:  Prostatitis, unspecified prostatitis type -     POCT Urinalysis DIP (Proadvantage Device)  Inguinal pain, unspecified laterality  Hematospermia  Other orders -     ciprofloxacin (CIPRO) 500 MG tablet; Take 1 tablet (500 mg total) by mouth 2 (two) times daily.    Follow up: 75mo

## 2019-05-26 ENCOUNTER — Other Ambulatory Visit: Payer: BC Managed Care – PPO

## 2019-05-26 ENCOUNTER — Other Ambulatory Visit: Payer: Self-pay | Admitting: Medical

## 2019-05-26 ENCOUNTER — Other Ambulatory Visit: Payer: Self-pay

## 2019-05-26 DIAGNOSIS — N419 Inflammatory disease of prostate, unspecified: Secondary | ICD-10-CM

## 2019-05-26 DIAGNOSIS — R358 Other polyuria: Secondary | ICD-10-CM

## 2019-05-26 DIAGNOSIS — R3589 Other polyuria: Secondary | ICD-10-CM

## 2019-05-27 LAB — PSA: Prostate Specific Ag, Serum: 0.7 ng/mL (ref 0.0–4.0)

## 2019-06-28 ENCOUNTER — Other Ambulatory Visit: Payer: Self-pay | Admitting: Medical

## 2019-06-28 DIAGNOSIS — R112 Nausea with vomiting, unspecified: Secondary | ICD-10-CM

## 2019-08-02 ENCOUNTER — Telehealth: Payer: Self-pay | Admitting: Medical

## 2019-08-02 NOTE — Telephone Encounter (Signed)
Please call Eddie Dibbles The question is where this pain is actually coming from, is it related to prostate flareups or other.  We could certainly send him to urology if he continues have lower pelvic pain.  And if he wants to do this go ahead and set this up.  However I am concerned about his hip finding on CT scan from August 2019.  I called today [facility] to ask that they make sure they have looked at the CT and the hip findings as this could be a cause of his pain.  We may or may not need to repeat a scan but I want them to weigh in on the hip findings from last year.  So he should be getting a call from orthopedics in case those findings have anything to do with his current pain.  Please ask him to call me back if he has not heard from [facility] this week

## 2019-08-02 NOTE — Telephone Encounter (Signed)
Patient is still having pelvic pain. Referring patient to urology.

## 2019-08-02 NOTE — Telephone Encounter (Signed)
And he knows to expect the call from ortho as well, correct?  FYI - we  had prior discussed this hip abnormality on CT and referred back to ortho.  Since his orthopedic is not in Epic, I don't have all details, but I am not so sure ortho addressed thief hip findings.    That is why I called ortho today to remind them of the findings and his pain/ongoing symptoms that are vague.

## 2019-08-13 ENCOUNTER — Telehealth: Payer: Self-pay | Admitting: Medical

## 2019-08-13 NOTE — Telephone Encounter (Signed)
Pt called and stated that he has not heard from [facility] about scheduling an appt. He said he is still having hip and Groin pain.

## 2019-08-13 NOTE — Telephone Encounter (Signed)
Please see last message from me.  Please call [facility].  I personally called ortho about a week ago and spoke to nurse about making sure his orthopedist reviewed his prior CT showing abnormality of hips and got him back in to review.  I think he needs to be evaluated for ongoing pains and possible management of the deformity noted on scan.     When I last spoke to them a little over a week ago, they advised that he would get a call from them.  He called [place] 08/13/2019 stating he had not heard from them yet.     (please let him know if they don't respond to him within the next few days, I would go see a different orthopedist)

## 2019-08-16 NOTE — Telephone Encounter (Signed)
Patient stated he has an appointment with ortho.

## 2019-08-20 ENCOUNTER — Other Ambulatory Visit: Payer: Self-pay | Admitting: Orthopedic Surgery

## 2019-08-20 DIAGNOSIS — M25551 Pain in right hip: Secondary | ICD-10-CM | POA: Diagnosis not present

## 2019-08-20 DIAGNOSIS — M25552 Pain in left hip: Secondary | ICD-10-CM | POA: Diagnosis not present

## 2019-08-27 ENCOUNTER — Other Ambulatory Visit: Payer: Self-pay

## 2019-08-27 ENCOUNTER — Telehealth: Payer: Self-pay | Admitting: Medical

## 2019-08-27 ENCOUNTER — Encounter: Payer: Self-pay | Admitting: Family Medicine

## 2019-08-27 ENCOUNTER — Ambulatory Visit: Payer: BC Managed Care – PPO | Admitting: Family Medicine

## 2019-08-27 VITALS — Temp 99.6°F | Ht 69.0 in | Wt 222.0 lb

## 2019-08-27 DIAGNOSIS — R519 Headache, unspecified: Secondary | ICD-10-CM | POA: Diagnosis not present

## 2019-08-27 DIAGNOSIS — Z20822 Contact with and (suspected) exposure to covid-19: Secondary | ICD-10-CM

## 2019-08-27 DIAGNOSIS — R059 Cough, unspecified: Secondary | ICD-10-CM

## 2019-08-27 DIAGNOSIS — R05 Cough: Secondary | ICD-10-CM

## 2019-08-27 DIAGNOSIS — J029 Acute pharyngitis, unspecified: Secondary | ICD-10-CM

## 2019-08-27 NOTE — Telephone Encounter (Signed)
Ct has been faxed.

## 2019-08-27 NOTE — Telephone Encounter (Signed)
Please call [facility] and get message to Dr. Mardelle Matte through his nurse.    We referred Mr. Polson back over due to concerns for avascular necrosis of hips.   It is my understanding that we previously sent a copy of the CT abdomen/pelvis from 03/05/2018 to [facility] that showed avascular necrosis of the hips.  We have called 2 times previously about this including a call 08/02/19 as we felt this wasn't addressed at his last ortho visit there.     I received note today from where he saw Dr. Mardelle Matte on 08/20/2019. There is mention of xrays being reviewed.  I was surprised to not see a comment about CT being reviewed.   Please again fax a copy of the CT from 2019 if they don't have a copy of this.   It appears they were ordering an MRI.    Thanks

## 2019-08-27 NOTE — Progress Notes (Signed)
   Subjective:  Documentation for virtual audio and video telecommunications through [place] encounter:  The patient was located at home. 2 patient identifiers used.  The provider was located in the office. The patient did consent to this visit and is aware of possible charges through their insurance for this visit.  The other persons participating in this telemedicine service were none.    Patient ID: Scott Moss, male    DOB: 1975-08-11, 44 y.o.   MRN: [id number]  HPI Chief Complaint  Patient presents with  . Headache  . Cough   Complains of a 24 hour history of rhinorrhea, nasal congestion, sore throat, headache, and coughing.   Denies fever, chills, body aches, chest pain, shortness of breath, abdominal pain, N/V/D.   No known Covid exposure.  He is taking extra strength Tylenol   Does not smoke.  Denies any underlying lung disease.  Reviewed allergies, medications, past medical, surgical, family, and social history.   Review of Systems Pertinent positives and negatives in the history of present illness.     Objective:   Physical Exam Temp 99.6 F (37.6 C)   Ht 5\' 9"  (1.753 m)   Wt 222 lb (100.7 kg)   BMI 32.78 kg/m   Alert and oriented and in no acute distress.  He does sound nasally congested and is coughing throughout the conversation.      Assessment & Plan:  Suspected COVID-19 virus infection  Cough  Acute nonintractable headache, unspecified headache type  Acute pharyngitis, unspecified etiology  Discussed limitations of a virtual visit. Does not appear to be in any acute distress. He is immunocompetent Discussed symptomatic treatment including rest, hydration, Mucinex DM, Tylenol or ibuprofen and salt water gargles. Counseling on how to text [facility] for a Covid test and advised him to quarantine. Counseling on red flag symptoms that would warrant immediate medical attention including uncontrolled vomiting, high fever, shortness of breath  or any worsening symptoms. Follow-up pending Covid result or as needed.  Time spent on call was 14 minutes and in review of previous records 20 minutes total.  This virtual service is not related to other E/M service within previous 7 days.

## 2019-08-29 ENCOUNTER — Other Ambulatory Visit: Payer: Self-pay | Admitting: Medical

## 2019-08-29 DIAGNOSIS — R112 Nausea with vomiting, unspecified: Secondary | ICD-10-CM

## 2019-08-30 ENCOUNTER — Encounter: Payer: Self-pay | Admitting: Medical

## 2019-08-30 ENCOUNTER — Other Ambulatory Visit: Payer: BLUE CROSS/BLUE SHIELD

## 2019-08-30 ENCOUNTER — Ambulatory Visit: Payer: BC Managed Care – PPO | Admitting: Medical

## 2019-08-30 ENCOUNTER — Other Ambulatory Visit: Payer: Self-pay

## 2019-08-30 VITALS — Temp 98.6°F | Ht 69.0 in | Wt 222.0 lb

## 2019-08-30 DIAGNOSIS — R059 Cough, unspecified: Secondary | ICD-10-CM

## 2019-08-30 DIAGNOSIS — R05 Cough: Secondary | ICD-10-CM

## 2019-08-30 DIAGNOSIS — J988 Other specified respiratory disorders: Secondary | ICD-10-CM

## 2019-08-30 DIAGNOSIS — J029 Acute pharyngitis, unspecified: Secondary | ICD-10-CM | POA: Diagnosis not present

## 2019-08-30 MED ORDER — AMOXICILLIN 500 MG PO CAPS: 500.0000 mg | ORAL_CAPSULE | Freq: Three times a day (TID) | ORAL | 0 refills | Status: DC

## 2019-08-30 MED ORDER — HYDROCOD POLST-CPM POLST ER 10-8 MG/5ML PO SUER: 5.0000 mL | Freq: Two times a day (BID) | ORAL | 0 refills | Status: DC

## 2019-08-30 NOTE — Progress Notes (Signed)
  Subjective:     Patient ID: Scott Moss, male   DOB: 1976/04/28, 44 y.o.   MRN: [id number]  This visit type was conducted due to national recommendations for restrictions regarding the COVID-19 Pandemic (e.g. social distancing) in an effort to limit this patient's exposure and mitigate transmission in our community.  Due to their co-morbid illnesses, this patient is at least at moderate risk for complications without adequate follow up.  This format is felt to be most appropriate for this patient at this time.    Documentation for virtual audio and video telecommunications through Zoom encounter:  The patient was located at home. The provider was located in the office. The patient did consent to this visit and is aware of possible charges through their insurance for this visit.  The other persons participating in this telemedicine service were none. Time spent on call was 20 minutes and in review of previous records 20 minutes total.  This virtual service is not related to other E/M service within previous 7 days.   HPI Chief Complaint  Patient presents with  . Follow-up    neg covid-headache, congetion, cough, sore throat, bilateral ear pain    Virtual consult for illness.  He notes 3-day history of cough, runny nose, nasal congestion, sore throat, headache.  Denies fever, chills, body aches, chest pain, shortness of breath.  He does have a lot of cough, lots of head congestion.  He went to [facility] urgent care in [facility] and had a Covid test a few days ago and that was negative.  He feels pretty rundown, no stopped up, ears hurt, pressure between the eyes..  Denies sick contacts or Covid contacts.  He has been using Tylenol, elderberry, over-the-counter sinus pill. No other aggravating or relieving factors. No other complaint.  Review of Systems As in subjective    Objective:   Physical Exam Due to coronavirus pandemic stay at home measures, patient visit was virtual and they were  not examined in person.   Temp 98.6 F (37 C)   Ht 5\' 9"  (1.753 m)   Wt 222 lb (100.7 kg)   BMI 32.78 kg/m       Assessment:     Encounter Diagnoses  Name Primary?  [name] Respiratory tract infection Yes  . Cough   . Sore throat        Plan:     We discussed that one could still have a false negative Covid test.  He will begin medication as below, rest, hydrate.  Despite the negative Covid test I advise he quarantine for now as if this could be Covid.  Advised to recheck in the next few days if much worse or new symptoms.  Advised to quarantine the full quarantine of 10 days including 3 days asymptomatic or very minimal symptoms by day 10 However if he wants to get a repeat test that is appropriate as well and may help further clarify quarantine time if negative again.  Vernon was seen today for follow-up.  Diagnoses and all orders for this visit:  Respiratory tract infection  Cough  Sore throat  Other orders -     chlorpheniramine-HYDROcodone (TUSSIONEX PENNKINETIC ER) 10-8 MG/5ML SUER; Take 5 mLs by mouth 2 (two) times daily. -     amoxicillin (AMOXIL) 500 MG capsule; Take 1 capsule (500 mg total) by mouth 3 (three) times daily.

## 2019-08-31 ENCOUNTER — Other Ambulatory Visit: Payer: Self-pay

## 2019-08-31 DIAGNOSIS — R112 Nausea with vomiting, unspecified: Secondary | ICD-10-CM

## 2019-08-31 MED ORDER — FAMOTIDINE 20 MG PO TABS: ORAL_TABLET | ORAL | 0 refills | Status: DC

## 2019-08-31 MED ORDER — ROSUVASTATIN CALCIUM 10 MG PO TABS: 10.0000 mg | ORAL_TABLET | Freq: Every day | ORAL | 1 refills | Status: DC

## 2019-08-31 NOTE — Progress Notes (Signed)
Done faxed note to pts wife

## 2019-09-11 ENCOUNTER — Other Ambulatory Visit: Payer: BLUE CROSS/BLUE SHIELD

## 2019-09-11 ENCOUNTER — Other Ambulatory Visit: Payer: Self-pay

## 2019-09-11 ENCOUNTER — Ambulatory Visit
Admission: RE | Admit: 2019-09-11 | Discharge: 2019-09-11 | Disposition: A | Discharge status: Home / Self Care | Payer: BC Managed Care – PPO | Source: Ambulatory Visit | Attending: Orthopedic Surgery | Admitting: Orthopedic Surgery

## 2019-09-11 DIAGNOSIS — M25551 Pain in right hip: Secondary | ICD-10-CM

## 2019-09-11 DIAGNOSIS — M1611 Unilateral primary osteoarthritis, right hip: Secondary | ICD-10-CM | POA: Diagnosis not present

## 2019-09-15 DIAGNOSIS — M25552 Pain in left hip: Secondary | ICD-10-CM | POA: Diagnosis not present

## 2019-09-15 DIAGNOSIS — M25551 Pain in right hip: Secondary | ICD-10-CM | POA: Diagnosis not present

## 2019-10-18 ENCOUNTER — Encounter: Payer: BLUE CROSS/BLUE SHIELD | Admitting: Medical

## 2019-11-24 ENCOUNTER — Other Ambulatory Visit: Payer: Self-pay | Admitting: Medical

## 2019-11-25 ENCOUNTER — Telehealth: Payer: Self-pay | Admitting: Medical

## 2019-11-25 ENCOUNTER — Other Ambulatory Visit: Payer: Self-pay

## 2019-11-25 MED ORDER — LISINOPRIL-HYDROCHLOROTHIAZIDE 20-12.5 MG PO TABS: 1.0000 | ORAL_TABLET | Freq: Every day | ORAL | 0 refills | Status: DC

## 2019-11-25 MED ORDER — OXYBUTYNIN CHLORIDE 5 MG PO TABS: ORAL_TABLET | ORAL | 0 refills | Status: DC

## 2019-11-25 NOTE — Telephone Encounter (Signed)
Pt made a cpe appt. His insurance needs refills to be 90 day in order to pay. Since he has a CPE scheduled pt is requesting recent refills be sent in for 90 days instead of 30.

## 2019-11-25 NOTE — Telephone Encounter (Signed)
Medication has been sent as a 90 day supply. Patient being notified via mychart

## 2019-12-09 DIAGNOSIS — X32XXXD Exposure to sunlight, subsequent encounter: Secondary | ICD-10-CM | POA: Diagnosis not present

## 2019-12-09 DIAGNOSIS — L570 Actinic keratosis: Secondary | ICD-10-CM | POA: Diagnosis not present

## 2019-12-16 ENCOUNTER — Ambulatory Visit: Payer: BC Managed Care – PPO | Admitting: Medical

## 2019-12-16 ENCOUNTER — Other Ambulatory Visit: Payer: Self-pay

## 2019-12-16 ENCOUNTER — Encounter: Payer: Self-pay | Admitting: Medical

## 2019-12-16 VITALS — BP 110/84 | HR 67 | Temp 98.1°F | Ht 69.0 in | Wt 225.6 lb

## 2019-12-16 DIAGNOSIS — K760 Fatty (change of) liver, not elsewhere classified: Secondary | ICD-10-CM | POA: Diagnosis not present

## 2019-12-16 DIAGNOSIS — R5383 Other fatigue: Secondary | ICD-10-CM

## 2019-12-16 DIAGNOSIS — R61 Generalized hyperhidrosis: Secondary | ICD-10-CM | POA: Diagnosis not present

## 2019-12-16 DIAGNOSIS — Z809 Family history of malignant neoplasm, unspecified: Secondary | ICD-10-CM

## 2019-12-16 DIAGNOSIS — Z0000 Encounter for general adult medical examination without abnormal findings: Secondary | ICD-10-CM | POA: Diagnosis not present

## 2019-12-16 DIAGNOSIS — E785 Hyperlipidemia, unspecified: Secondary | ICD-10-CM | POA: Diagnosis not present

## 2019-12-16 DIAGNOSIS — R0789 Other chest pain: Secondary | ICD-10-CM

## 2019-12-16 DIAGNOSIS — Z8249 Family history of ischemic heart disease and other diseases of the circulatory system: Secondary | ICD-10-CM | POA: Diagnosis not present

## 2019-12-16 DIAGNOSIS — Z87438 Personal history of other diseases of male genital organs: Secondary | ICD-10-CM

## 2019-12-16 DIAGNOSIS — E669 Obesity, unspecified: Secondary | ICD-10-CM

## 2019-12-16 DIAGNOSIS — Z8547 Personal history of malignant neoplasm of testis: Secondary | ICD-10-CM

## 2019-12-16 DIAGNOSIS — I10 Essential (primary) hypertension: Secondary | ICD-10-CM

## 2019-12-16 DIAGNOSIS — M21959 Unspecified acquired deformity of unspecified thigh: Secondary | ICD-10-CM

## 2019-12-16 DIAGNOSIS — M79602 Pain in left arm: Secondary | ICD-10-CM

## 2019-12-16 NOTE — Patient Instructions (Addendum)
It was a pleasure to see you as always  Recommendations: See your eye doctor yearly for routine vision care.  See your dentist yearly for routine dental care including hygiene visits twice yearly.   Vaccines: You are up to date on Tetanus vaccine I recommend a yearly flu shot I recommend covid vaccine Declines both flu and covid vaccines   Cancer screens: Check your insurance coverage for colonoscopy or colon cancer screening.    You had a PSA prostate screen that was normal 05/2019   Separate significant issues discussed: Excess sweating/Hyperhidrosis - increase your Oxybutynin medication to twice daily.  Call before you run out of medication  Hip pain and hip abnormality on scan - you had recent steroid shots with ortho.  Follow up with Dr. Mardelle Matte for further management of the hip pains  High blood pressure and high cholesterol  continue your medicaiton as usual  History of fatty liver disease  Eat a low fat diet  Exercise regularly  Cut back on alcohol or limit alcohol  Work on losing weight   Fatigue can be cursed by numerous things  We will check labs  Consider sleep study to check for sleep apnea  You may have low testosterone but with history of testicular cancer, we would avoid testosterone therapy.  Your testosterone has been low before   Chest pain Your EKG didn't show anything scary today.  However, given family history of heart disease, fatigue, and chest discomfort, this would be a good time to do follow up with cardiology   Fatty Liver Disease  Fatty liver disease occurs when too much fat has built up in your liver cells. Fatty liver disease is also called hepatic steatosis or steatohepatitis. The liver removes harmful substances from your bloodstream and produces fluids that your body needs. It also helps your body use and store energy from the food you eat. In many cases, fatty liver disease does not cause symptoms or problems. It is often  diagnosed when tests are being done for other reasons. However, over time, fatty liver can cause inflammation that may lead to more serious liver problems, such as scarring of the liver (cirrhosis) and liver failure. Fatty liver is associated with insulin resistance, increased body fat, high blood pressure (hypertension), and high cholesterol. These are features of metabolic syndrome and increase your risk for stroke, diabetes, and heart disease. What are the causes? This condition may be caused by:  Drinking too much alcohol.  Poor nutrition.  Obesity.  Cushing's syndrome.  Diabetes.  High cholesterol.  Certain drugs.  Poisons.  Some viral infections.  Pregnancy. What increases the risk? You are more likely to develop this condition if you:  Abuse alcohol.  Are overweight.  Have diabetes.  Have hepatitis.  Have a high triglyceride level.  Are pregnant. What are the signs or symptoms? Fatty liver disease often does not cause symptoms. If symptoms do develop, they can include:  Fatigue.  Weakness.  Weight loss.  Confusion.  Abdominal pain.  Nausea and vomiting.  Yellowing of your skin and the white parts of your eyes (jaundice).  Itchy skin. How is this diagnosed? This condition may be diagnosed by:  A physical exam and medical history.  Blood tests.  Imaging tests, such as an ultrasound, CT scan, or MRI.  A liver biopsy. A small sample of liver tissue is removed using a needle. The sample is then looked at under a microscope. How is this treated? Fatty liver disease is often caused  by other health conditions. Treatment for fatty liver may involve medicines and lifestyle changes to manage conditions such as:  Alcoholism.  High cholesterol.  Diabetes.  Being overweight or obese. Follow these instructions at home:   Do not drink alcohol. If you have trouble quitting, ask your health care provider how to safely quit with the help of medicine  or a supervised program. This is important to keep your condition from getting worse.  Eat a healthy diet as told by your health care provider. Ask your health care provider about working with a diet and nutrition specialist (dietitian) to develop an eating plan.  Exercise regularly. This can help you lose weight and control your cholesterol and diabetes. Talk to your health care provider about an exercise plan and which activities are best for you.  Take over-the-counter and prescription medicines only as told by your health care provider.  Keep all follow-up visits as told by your health care provider. This is important. Contact a health care provider if: You have trouble controlling your:  Blood sugar. This is especially important if you have diabetes.  Cholesterol.  Drinking of alcohol. Get help right away if:  You have abdominal pain.  You have jaundice.  You have nausea and vomiting.  You vomit blood or material that looks like coffee grounds.  You have stools that are black, tar-like, or bloody. Summary  Fatty liver disease develops when too much fat builds up in the cells of your liver.  Fatty liver disease often causes no symptoms or problems. However, over time, fatty liver can cause inflammation that may lead to more serious liver problems, such as scarring of the liver (cirrhosis).  You are more likely to develop this condition if you abuse alcohol, are pregnant, are overweight, have diabetes, have hepatitis, or have high triglyceride levels.  Contact your health care provider if you have trouble controlling your weight, blood sugar, cholesterol, or drinking of alcohol. This information is not intended to replace advice given to you by your health care provider. Make sure you discuss any questions you have with your health care provider. Document Revised: 07/04/2017 Document Reviewed: 04/30/2017 Elsevier Patient Education  2020 [employer].

## 2019-12-16 NOTE — Progress Notes (Signed)
Subjective: Chief Complaint  Patient presents with  . Annual Exam    with fasting labs     Medical team: See dentist Not currently seeing eye doctor Ortho, Dr. Marchia Bond Dr. Skeet Latch, cardiology [facility] in [place], [place], Camelia Eng, PA-C here for primary care   Concerns: Just recently had a biopsy of the left temple from dermatology  Just recently saw orthopedics about his hips.  He is still getting hip pain despite steroid injections he has history of abnormal MRI of hips  Lately he notes being fatigued all the time, no snoring or witnessed apnea but he just feels lack of energy.  He is also been having some chest discomfort from time to time that is brief across from his chest, tightness, some associated left arm pain, some associated sweats   Reviewed their medical, surgical, family, social, medication, and allergy history and updated chart as appropriate.  Past Medical History:  Diagnosis Date  . Arthritis   . Atypical chest pain 07/06/2015   Non-obstructive CAD on [facility] 04/2010.  [name] Chondromalacia of left knee 09/2012  . Complication of anesthesia   . Family history of cancer    multiple family members, multiple types.   Sees Dr. Marin Olp  . Family history of cancer   . Family history of premature CAD   . H/O cardiovascular stress test 07/14/2015   myocardial perfusion scan, normal, EF 45-54% mildy reduced LV function, Dr. Oval Linsey  . H/O echocardiogram 07/18/2015   LV mild hypertrophy, 55-60%EF, Dr. Oval Linsey  . High cholesterol   . Hypertension   . Obesity   . Overweight   . PONV (postoperative nausea and vomiting)   . Testicular cancer ([place]) 02/2005   Dr. Marin Olp    Past Surgical History:  Procedure Laterality Date  . ANTERIOR CERVICAL DECOMP/DISCECTOMY FUSION  11/11/2008   C5-6  . APPENDECTOMY    . CARDIAC CATHETERIZATION  04/13/2010   no disease  . CERVICAL SPINE SURGERY  2015   Dr. Deri Fuelling  . COLONOSCOPY     prior, no problems  per patient, year unknown, possibly in early 60s.  [name] KNEE ARTHROSCOPY Right   . KNEE ARTHROSCOPY Left 10/19/2015   Procedure: LEFT KNEE SCOPE CHONDROPLASTY;  Surgeon: [name], MD;  Location: [place];  Service: Orthopedics;  Laterality: Left;  . KNEE ARTHROSCOPY WITH LATERAL RELEASE Left 09/18/2012   Procedure: KNEE ARTHROSCOPY WITH LATERAL RELEASE;  Surgeon: [name], MD;  Location: [place];  Service: Orthopedics;  Laterality: Left;  WITH DEBRIDEMENT AND SHAVING(CHONDROPLASTY), Excision Medial [place]  . NASAL SINUS SURGERY  2014  . PARTIAL KNEE ARTHROPLASTY Left 08/15/2016   Procedure: PATELLA FEMORAL REPLACEMENT LEFT KNEE;  Surgeon: [name], MD;  Location: [place];  Service: Orthopedics;  Laterality: Left;  . RADICAL ORCHIECTOMY  02/07/2005   left; with left testicular implant    Social History   Socioeconomic History  . Marital status: Married    Spouse name: Not on file  . Number of children: 2  . Years of education: Not on file  . Highest education level: Not on file  Occupational History  . Occupation: truck Geophysicist/field seismologist  Tobacco Use  . Smoking status: Never Smoker  . Smokeless tobacco: Never Used  . Tobacco comment: never used tobacco  Substance and Sexual Activity  . Alcohol use: Yes    Alcohol/week: 3.0 standard drinks    Types: 3 Cans of beer per week  Comment: social  . Drug use: No  . Sexual activity: Yes  Other Topics Concern  . 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   Exercise - not much as of 03/2016      Social Determinants of Health   Financial Resource Strain:   . Difficulty of Paying Living Expenses:   Food Insecurity:   . Worried About Charity fundraiser in the Last Year:   . Arboriculturist in the Last Year:   Transportation Needs:   . Film/video editor  (Medical):   [name] Lack of Transportation (Non-Medical):   Physical Activity:   . Days of Exercise per Week:   . Minutes of Exercise per Session:   Stress:   . Feeling of Stress :   Social Connections:   . Frequency of Communication with Friends and Family:   . Frequency of Social Gatherings with Friends and Family:   . Attends Religious Services:   . Active Member of Clubs or Organizations:   . Attends Archivist Meetings:   [name] Marital Status:   Intimate Partner Violence:   . Fear of Current or Ex-Partner:   . Emotionally Abused:   [name] Physically Abused:   . Sexually Abused:     Family History  Problem Relation Age of Onset  . Ovarian cancer Mother        uterine, mets  . Hypertension Mother   . Stroke Mother   . Migraines Mother   . Cancer Mother   . Lung cancer Father   . Hypertension Father   . Heart attack Father 22  . Skin cancer Father        ?  [name] Heart disease Father   . Cancer Father        lung  . Cancer Sister 58       multiple myeloma  . Hypertension Sister   . Migraines Sister   . Diabetes Maternal Uncle   . Colon cancer Maternal Uncle      Current Outpatient Medications:  .  ASPIRIN LOW DOSE 81 MG EC tablet, TAKE 1 TABLET(81 MG) BY MOUTH DAILY. SWALLOW WHOLE, Disp: 90 tablet, Rfl: 1 .  famotidine (PEPCID) 20 MG tablet, TAKE 1 TABLET(20 MG) BY MOUTH TWICE DAILY, Disp: 180 tablet, Rfl: 0 .  lisinopril-hydrochlorothiazide (ZESTORETIC) 20-12.5 MG tablet, Take 1 tablet by mouth daily., Disp: 90 tablet, Rfl: 0 .  oxybutynin (DITROPAN) 5 MG tablet, TAKE 1 TABLET(5 MG) BY MOUTH DAILY, Disp: 90 tablet, Rfl: 0 .  rosuvastatin (CRESTOR) 10 MG tablet, Take 1 tablet (10 mg total) by mouth daily., Disp: 90 tablet, Rfl: 1  Allergies  Allergen Reactions  . Benicar [Olmesartan]     Rash, allergic reaction  . Nexium [Esomeprazole Magnesium]     Rash, allergic reaction       Review of Systems Constitutional: -fever, -chills, -sweats, -unexpected weight  change, -decreased appetite, -fatigue Allergy: -sneezing, -itching, -congestion Dermatology: -changing moles, --rash, -lumps ENT: -runny nose, -ear pain, -sore throat, -hoarseness, -sinus pain, -teeth pain, - ringing in ears, -hearing loss, -nosebleeds Cardiology: -chest pain, -palpitations, -swelling, -difficulty breathing when lying flat, -waking up short of breath Respiratory: -cough, -shortness of breath, -difficulty breathing with exercise or exertion, -wheezing, -coughing up blood Gastroenterology: -abdominal pain, -nausea, -vomiting, -diarrhea, -constipation, -blood in stool, -changes in bowel movement, -difficulty swallowing or eating Hematology: -  bleeding, -bruising  Musculoskeletal: -joint aches, -muscle aches, -joint swelling, -back pain, -neck pain, -cramping, -changes in gait Ophthalmology: denies vision changes, eye redness, itching, discharge Urology: -burning with urination, -difficulty urinating, -blood in urine, -urinary frequency, -urgency, -incontinence Neurology: -headache, -weakness, -tingling, -numbness, -memory loss, -falls, -dizziness Psychology: -depressed mood, -agitation, -sleep problems Male GU: no testicular mass, pain, no lymph nodes swollen, no swelling, no rash.     Objective:  BP 110/84   Pulse 67   Temp 98.1 F (36.7 C)   Ht '5\' 9"'  (1.753 m)   Wt 225 lb 9.6 oz (102.3 kg)   SpO2 96%   BMI 33.32 kg/m   General appearance: alert, no distress, WD/WN, Caucasian male Skin: round 81m wound from recent biopsy left temple, tattoos left and right forearm, tattoo of PAUL across upper abdomne, no worrisome lesions otherwise Neck: supple, no lymphadenopathy, no thyromegaly, no masses, normal ROM, no bruits Chest: non tender, normal shape and expansion Heart: RRR, normal S1, S2, no murmurs Lungs: CTA bilaterally, no wheezes, rhonchi, or rales Abdomen: +bs, soft, non tender, non distended, no masses, no hepatomegaly, no splenomegaly, no bruits Back: non tender,  normal ROM, no scoliosis Musculoskeletal: upper extremities non tender, no obvious deformity, lower extremities non tender, no obvious deformity Extremities: no edema, no cyanosis, no clubbing Pulses: 2+ symmetric, upper and lower extremities, normal cap refill Neurological: alert, oriented x 3, CN2-12 intact, strength normal upper extremities and lower extremities, sensation normal throughout, DTRs 2+ throughout, no cerebellar signs, gait normal Psychiatric: normal affect, behavior normal, pleasant  GU: normal male external genitalia,circumcised, left testicular prosthetics s/p orchectomy, nontender, no masses, no hernia, no lymphadenopathy Rectal: deferred   EKG  indication screen heart disease, chest discomfort, hypertension, premature CAD in family, rate 67 bpm, PR 160 ms, QRS 98 ms, QTC 409 ms, axis 67 degrees, normal sinus rhythm, no acute change, questionable LVH   Assessment and Plan :   Encounter Diagnoses  Name Primary?  . Encounter for health maintenance examination in adult Yes  . Essential hypertension   . Fatty liver   . Hyperhidrosis   . History of prostatitis   . H/O testicular cancer   . Family history of premature CAD   . Family history of cancer   . Obesity with serious comorbidity, unspecified classification, unspecified obesity type   . Deformity of hip joint, unspecified laterality   . Hyperlipidemia, unspecified hyperlipidemia type   . Fatigue, unspecified type   . Chest discomfort   . Pain of left upper extremity     Physical exam - discussed and counseled on healthy lifestyle, diet, exercise, preventative care, vaccinations, sick and well care, proper use of emergency dept and after hours care, and addressed their concerns.    Health screening: See your eye doctor yearly for routine vision care. See your dentist yearly for routine dental care including hygiene visits twice yearly.  Vaccines: You are up to date on Tetanus vaccine I recommend a yearly  flu shot I recommend covid vaccine Declines both flu and covid vaccines   Cancer screens: Check your insurance coverage for colonoscopy or colon cancer screening.    You had a PSA prostate screen that was normal 05/2019   Separate significant issues discussed: Excess sweating/Hyperhidrosis - increase your Oxybutynin medication to twice daily.  Call before you run out of medication  Hip pain and hip abnormality on scan - you had recent steroid shots with ortho.  Follow up with Dr. LMardelle Mattefor further management of  the hip pains  High blood pressure and high cholesterol  continue your medicaiton as usual  History of fatty liver disease  Eat a low fat diet  Exercise regularly  Cut back on alcohol or limit alcohol  Work on losing weight   Fatigue can be cursed by numerous things  We will check labs  Consider sleep study to check for sleep apnea  You may have low testosterone but with history of testicular cancer, we would avoid testosterone therapy.  Your testosterone has been low before   Chest pain Your EKG didn't show anything scary today.  However, given family history of heart disease, fatigue, and chest discomfort, this would be a good time to do follow up with cardiology     Mar[place] was seen today for annual exam.  Diagnoses and all orders for this visit:  Encounter for health maintenance examination in adult -     Comprehensive metabolic panel -     CBC with Differential/Platelet -     Lipid panel -     TSH -     VITAMIN D 25 Hydroxy (Vit-D Deficiency, Fractures) -     EKG 12-Lead -     Ambulatory referral to Cardiology  Essential hypertension -     Comprehensive metabolic panel -     Ambulatory referral to Cardiology  Fatty liver -     Comprehensive metabolic panel -     Lipid panel -     Ambulatory referral to Cardiology  Hyperhidrosis  History of prostatitis  H/O testicular cancer  Family history of premature CAD -     Lipid panel -      Ambulatory referral to Cardiology  Family history of cancer  Obesity with serious comorbidity, unspecified classification, unspecified obesity type  Deformity of hip joint, unspecified laterality  Hyperlipidemia, unspecified hyperlipidemia type -     Lipid panel  Fatigue, unspecified type -     TSH -     VITAMIN D 25 Hydroxy (Vit-D Deficiency, Fractures) -     EKG 12-Lead  Chest discomfort -     EKG 12-Lead -     Ambulatory referral to Cardiology  Pain of left upper extremity   Follow-up pending labs, yearly for physical

## 2019-12-17 LAB — CBC WITH DIFFERENTIAL/PLATELET
Basophils Absolute: 0 10*3/uL (ref 0.0–0.2)
Basos: 1 %
EOS (ABSOLUTE): 0.2 10*3/uL (ref 0.0–0.4)
Eos: 3 %
Hematocrit: 48 % (ref 37.5–51.0)
Hemoglobin: 15.8 g/dL (ref 13.0–17.7)
Immature Grans (Abs): 0 10*3/uL (ref 0.0–0.1)
Immature Granulocytes: 1 %
Lymphocytes Absolute: 1.9 10*3/uL (ref 0.7–3.1)
Lymphs: 34 %
MCH: 30.2 pg (ref 26.6–33.0)
MCHC: 32.9 g/dL (ref 31.5–35.7)
MCV: 92 fL (ref 79–97)
Monocytes Absolute: 0.6 10*3/uL (ref 0.1–0.9)
Monocytes: 11 %
Neutrophils Absolute: 2.9 10*3/uL (ref 1.4–7.0)
Neutrophils: 50 %
Platelets: 296 10*3/uL (ref 150–450)
RBC: 5.23 x10E6/uL (ref 4.14–5.80)
RDW: 12.9 % (ref 11.6–15.4)
WBC: 5.7 10*3/uL (ref 3.4–10.8)

## 2019-12-17 LAB — COMPREHENSIVE METABOLIC PANEL
ALT: 48 IU/L — ABNORMAL HIGH (ref 0–44)
AST: 29 IU/L (ref 0–40)
Albumin/Globulin Ratio: 1.9 (ref 1.2–2.2)
Albumin: 4.6 g/dL (ref 4.0–5.0)
Alkaline Phosphatase: 93 IU/L (ref 39–117)
BUN/Creatinine Ratio: 18 (ref 9–20)
BUN: 18 mg/dL (ref 6–24)
Bilirubin Total: 0.6 mg/dL (ref 0.0–1.2)
CO2: 21 mmol/L (ref 20–29)
Calcium: 9.4 mg/dL (ref 8.7–10.2)
Chloride: 103 mmol/L (ref 96–106)
Creatinine, Ser: 0.99 mg/dL (ref 0.76–1.27)
GFR calc Af Amer: 106 mL/min/{1.73_m2} (ref >59)
GFR calc non Af Amer: 92 mL/min/{1.73_m2} (ref >59)
Globulin, Total: 2.4 g/dL (ref 1.5–4.5)
Glucose: 88 mg/dL (ref 65–99)
Potassium: 4.5 mmol/L (ref 3.5–5.2)
Sodium: 137 mmol/L (ref 134–144)
Total Protein: 7 g/dL (ref 6.0–8.5)

## 2019-12-17 LAB — LIPID PANEL
Chol/HDL Ratio: 3.2 ratio (ref 0.0–5.0)
Cholesterol, Total: 146 mg/dL (ref 100–199)
HDL: 46 mg/dL (ref >39)
LDL Chol Calc (NIH): 76 mg/dL (ref 0–99)
Triglycerides: 139 mg/dL (ref 0–149)
VLDL Cholesterol Cal: 24 mg/dL (ref 5–40)

## 2019-12-17 LAB — TSH: TSH: 0.551 u[IU]/mL (ref 0.450–4.500)

## 2019-12-17 LAB — VITAMIN D 25 HYDROXY (VIT D DEFICIENCY, FRACTURES): Vit D, 25-Hydroxy: 24.5 ng/mL — ABNORMAL LOW (ref 30.0–100.0)

## 2019-12-20 ENCOUNTER — Other Ambulatory Visit: Payer: Self-pay | Admitting: Medical

## 2019-12-20 DIAGNOSIS — R112 Nausea with vomiting, unspecified: Secondary | ICD-10-CM

## 2019-12-20 MED ORDER — FAMOTIDINE 20 MG PO TABS: ORAL_TABLET | ORAL | 0 refills | Status: DC

## 2019-12-20 MED ORDER — OXYBUTYNIN CHLORIDE 5 MG PO TABS: 5.0000 mg | ORAL_TABLET | Freq: Two times a day (BID) | ORAL | 0 refills | Status: DC

## 2019-12-20 MED ORDER — VITAMIN D 25 MCG (1000 UNIT) PO TABS: 1000.0000 [IU] | ORAL_TABLET | Freq: Every day | ORAL | 3 refills | Status: DC

## 2019-12-20 MED ORDER — ASPIRIN 81 MG PO TBEC: DELAYED_RELEASE_TABLET | ORAL | 1 refills | Status: DC

## 2019-12-21 ENCOUNTER — Telehealth: Payer: Self-pay | Admitting: Medical

## 2019-12-21 NOTE — Telephone Encounter (Signed)
Refer to GI for screening clonoscopy

## 2019-12-22 ENCOUNTER — Other Ambulatory Visit: Payer: Self-pay

## 2019-12-22 ENCOUNTER — Encounter: Payer: Self-pay | Admitting: Gastroenterology

## 2019-12-22 DIAGNOSIS — Z1211 Encounter for screening for malignant neoplasm of colon: Secondary | ICD-10-CM

## 2019-12-22 NOTE — Telephone Encounter (Signed)
Referral has been sent.

## 2019-12-31 DIAGNOSIS — M25551 Pain in right hip: Secondary | ICD-10-CM | POA: Diagnosis not present

## 2019-12-31 DIAGNOSIS — M25552 Pain in left hip: Secondary | ICD-10-CM | POA: Diagnosis not present

## 2020-01-11 ENCOUNTER — Other Ambulatory Visit: Payer: Self-pay

## 2020-01-11 ENCOUNTER — Ambulatory Visit: Payer: BC Managed Care – PPO

## 2020-01-11 VITALS — Ht 69.0 in | Wt 224.0 lb

## 2020-01-11 DIAGNOSIS — Z1211 Encounter for screening for malignant neoplasm of colon: Secondary | ICD-10-CM

## 2020-01-11 MED ORDER — SUTAB 1479-225-188 MG PO TABS: 1.0000 | ORAL_TABLET | ORAL | 0 refills | Status: DC

## 2020-01-11 NOTE — Progress Notes (Signed)
Denies allergies to eggs or soy products. Denies complication of anesthesia or sedation. Denies use of weight loss medication. Denies use of O2.   Emmi instructions given for colonoscopy.  Covid test is scheduled for Friday 02/04/20 @ 8:10 Am.

## 2020-01-12 ENCOUNTER — Telehealth: Payer: Self-pay | Admitting: Gastroenterology

## 2020-01-12 ENCOUNTER — Ambulatory Visit: Payer: BC Managed Care – PPO | Admitting: Cardiology

## 2020-01-12 ENCOUNTER — Other Ambulatory Visit: Payer: Self-pay

## 2020-01-12 ENCOUNTER — Encounter: Payer: Self-pay | Admitting: Cardiology

## 2020-01-12 VITALS — BP 114/78 | HR 90 | Resp 16 | Ht 69.0 in | Wt 226.0 lb

## 2020-01-12 DIAGNOSIS — R072 Precordial pain: Secondary | ICD-10-CM

## 2020-01-12 DIAGNOSIS — I10 Essential (primary) hypertension: Secondary | ICD-10-CM | POA: Diagnosis not present

## 2020-01-12 DIAGNOSIS — Z8249 Family history of ischemic heart disease and other diseases of the circulatory system: Secondary | ICD-10-CM | POA: Diagnosis not present

## 2020-01-12 DIAGNOSIS — Z1211 Encounter for screening for malignant neoplasm of colon: Secondary | ICD-10-CM

## 2020-01-12 DIAGNOSIS — E782 Mixed hyperlipidemia: Secondary | ICD-10-CM

## 2020-01-12 MED ORDER — SUTAB 1479-225-188 MG PO TABS: 24.0000 | ORAL_TABLET | ORAL | 0 refills | Status: DC

## 2020-01-12 NOTE — Telephone Encounter (Signed)
Patient calling states the pharmacy does not show the script sent yesterday for prep please resend.

## 2020-01-12 NOTE — Telephone Encounter (Signed)
Resent Sutab to pt's pharmacy

## 2020-01-12 NOTE — Progress Notes (Signed)
Date:  01/12/2020   ID:  [name], DOB 12/12/1975, MRN [id number]  PCP:  Carlena Hurl, PA-C  Cardiologist:  Rex Kras, DO, [facility] (established care 01/12/2020)  REASON FOR CONSULT: Family history of premature CAD, HTN, and Chest Discomfort.   REQUESTING PHYSICIAN:  Carlena Hurl, PA-C [address] [place],  [place] [address]  Chief Complaint  Patient presents with  . Chest Pain  . Family Hx of CAD  . Family Hx of Heart Attack  . Hypertension  . Hospitalization Follow-up    Referred by Dr. Chana Bode    HPI  OJAS COONE is a 44 y.o. male who is being seen today for the evaluation of chest discomfort with history of premature coronary artery disease in the family and hypertension at the request of Carlena Hurl, PA-C. Patient's past medical history and cardiac risk factors include: Premature coronary artery disease in the family, hypertension, hyperlipidemia.  Patient is accompanied by his wife at today's office visit.  Patient states that has been having chest discomfort for at least 1 year or more.  His last episode was 3 weeks ago.  He states that the episodes are very intermittent but they when they do occur the discomfort is located substernally, at times brought on by effort related activities and does not improve with rest.  The pain is usually self-limited.  He does have left arm tingling but this could be secondary to underlying ulnar neuropathy and prior surgeries to the neck and shoulder.  Patient is currently being treated for hypertension and hyperlipidemia by his primary care provider.  Dad had MI at age of 38 which required coronary interventions.   Denies prior history of coronary artery disease, myocardial infarction, congestive heart failure, deep venous thrombosis, pulmonary embolism, stroke, transient ischemic attack.  FUNCTIONAL STATUS: His work is labor intensive, but no regular exercise or daily routine.    ALLERGIES: Allergies   Allergen Reactions  . Benicar [Olmesartan]     Rash, allergic reaction  . Nexium [Esomeprazole Magnesium]     Rash, allergic reaction    MEDICATION LIST PRIOR TO VISIT: Current Meds  Medication Sig  . acetaminophen (TYLENOL) 500 MG tablet Take 500 mg by mouth every 6 (six) hours as needed.  [name] aspirin (ASPIRIN LOW DOSE) 81 MG EC tablet TAKE 1 TABLET(81 MG) BY MOUTH DAILY. SWALLOW WHOLE  . cholecalciferol (VITAMIN D3) 25 MCG (1000 UNIT) tablet Take 1 tablet (1,000 Units total) by mouth daily.  . famotidine (PEPCID) 20 MG tablet TAKE 1 TABLET(20 MG) BY MOUTH TWICE DAILY  . ibuprofen (ADVIL) 400 MG tablet Take 400 mg by mouth every 6 (six) hours as needed.  [name] lisinopril-hydrochlorothiazide (ZESTORETIC) 20-12.5 MG tablet Take 1 tablet by mouth daily.  [name] oxybutynin (DITROPAN) 5 MG tablet Take 1 tablet (5 mg total) by mouth 2 (two) times daily. TAKE 1 TABLET(5 MG) BY MOUTH DAILY  . rosuvastatin (CRESTOR) 10 MG tablet Take 1 tablet (10 mg total) by mouth daily.  . Sodium Sulfate-Mag Sulfate-KCl (SUTAB) [phone] MG TABS Take 1 kit by mouth as directed.  . Sodium Sulfate-Mag Sulfate-KCl (SUTAB) [id number] MG TABS Take 24 tablets by mouth as directed.     PAST MEDICAL HISTORY: Past Medical History:  Diagnosis Date  . Arthritis   . Atypical chest pain 07/06/2015   Non-obstructive CAD on [facility] 04/2010.  [name] Chondromalacia of left knee 09/2012  . Complication of anesthesia   . Family history of cancer    multiple  family members, multiple types.   Sees Dr. Marin Olp  . Family history of cancer   . Family history of premature CAD   . GERD (gastroesophageal reflux disease)   . H/O cardiovascular stress test 07/14/2015   myocardial perfusion scan, normal, EF 45-54% mildy reduced LV function, Dr. Oval Linsey  . H/O echocardiogram 07/18/2015   LV mild hypertrophy, 55-60%EF, Dr. Oval Linsey  . High cholesterol   . Hypertension   . Obesity   . Overweight   . PONV (postoperative nausea and vomiting)    . Testicular cancer ([place]) 02/2005   Dr. Marin Olp    PAST SURGICAL HISTORY: Past Surgical History:  Procedure Laterality Date  . ANTERIOR CERVICAL DECOMP/DISCECTOMY FUSION  11/11/2008   C5-6  . APPENDECTOMY    . CARDIAC CATHETERIZATION  04/13/2010   no disease  . CERVICAL SPINE SURGERY  2015   Dr. Deri Fuelling  . COLONOSCOPY     prior, no problems per patient, year unknown, possibly in early 76s.  [name] KNEE ARTHROSCOPY Right   . KNEE ARTHROSCOPY Left 10/19/2015   Procedure: LEFT KNEE SCOPE CHONDROPLASTY;  Surgeon: [name], MD;  Location: [place];  Service: Orthopedics;  Laterality: Left;  . KNEE ARTHROSCOPY WITH LATERAL RELEASE Left 09/18/2012   Procedure: KNEE ARTHROSCOPY WITH LATERAL RELEASE;  Surgeon: [name], MD;  Location: [place];  Service: Orthopedics;  Laterality: Left;  WITH DEBRIDEMENT AND SHAVING(CHONDROPLASTY), Excision Medial [place]  . NASAL SINUS SURGERY  2014  . PARTIAL KNEE ARTHROPLASTY Left 08/15/2016   Procedure: PATELLA FEMORAL REPLACEMENT LEFT KNEE;  Surgeon: [name], MD;  Location: [place];  Service: Orthopedics;  Laterality: Left;  . RADICAL ORCHIECTOMY  02/07/2005   left; with left testicular implant    FAMILY HISTORY: The patient family history includes Cancer in his father and mother; Cancer (age of onset: 69) in his sister; Colon cancer in his maternal uncle; Diabetes in his maternal uncle; Esophageal cancer in his maternal uncle; Heart attack in his maternal uncle and paternal uncle; Heart attack (age of onset: 53) in his father; Heart disease in his father; Hypertension in his father, mother, and sister; Lung cancer in his father; Migraines in his mother and sister; Ovarian cancer in his mother; Skin cancer in his father; Stroke in his mother.  SOCIAL HISTORY:  The patient  reports that he has never smoked. He has never used smokeless tobacco. He reports current alcohol use of about 3.0  standard drinks of alcohol per week. He reports that he does not use drugs.  REVIEW OF SYSTEMS: Review of Systems  Constitution: Negative for chills and fever.  HENT: Negative for hoarse voice and nosebleeds.   Eyes: Negative for discharge, double vision and pain.  Cardiovascular: Positive for chest pain and dyspnea on exertion. Negative for claudication, leg swelling, near-syncope, orthopnea, palpitations, paroxysmal nocturnal dyspnea and syncope.  Respiratory: Positive for shortness of breath. Negative for hemoptysis.   Musculoskeletal: Negative for muscle cramps and myalgias.  Gastrointestinal: Negative for abdominal pain, constipation, diarrhea, hematemesis, hematochezia, melena, nausea and vomiting.  Neurological: Negative for dizziness and light-headedness.    PHYSICAL EXAM: Vitals with BMI 01/12/2020 01/11/2020 12/16/2019  Height '5\' 9"'  '5\' 9"'  '5\' 9"'   Weight 226 lbs 224 lbs 225 lbs 10 oz  BMI 33.36 71.69 67.8  Systolic 938 - 101  Diastolic 78 - 84  Pulse 90 - 67    CONSTITUTIONAL: Well-developed and well-nourished. No acute distress.  SKIN: Skin is warm  and dry. No rash noted. No cyanosis. No pallor. No jaundice HEAD: Normocephalic and atraumatic.  EYES: No scleral icterus MOUTH/THROAT: Moist oral membranes.  NECK: No JVD present. No thyromegaly noted. No carotid bruits  LYMPHATIC: No visible cervical adenopathy.  CHEST Normal respiratory effort. No intercostal retractions  LUNGS: Clear to auscultation bilaterally.  No stridor. No wheezes. No rales.  CARDIOVASCULAR: Regular rate and rhythm, positive S1-S2, no murmurs rubs or gallops appreciated. ABDOMINAL: No apparent ascites.  EXTREMITIES: No peripheral edema  HEMATOLOGIC: No significant bruising NEUROLOGIC: Oriented to person, place, and time. Nonfocal. Normal muscle tone.  PSYCHIATRIC: Normal mood and affect. Normal behavior. Cooperative  CARDIAC DATABASE: EKG: 01/12/2020: Normal sinus rhythm, 88 bpm, normal axis, poor R  wave progression, without underlying ischemia or injury pattern.    Echocardiogram: 07/18/2015: LVEF 55-60%, mild LVH, normal diastolic parameters, no significant valvular heart disease.  Stress Testing: 07/2015: The left ventricular ejection fraction is mildly decreased (45-54%). Nuclear stress EF: 51%. There was no ST segment deviation noted during stress The study is normal. This is a low risk study.  Heart Catheterization: Patient had a left heart catheterization back in 2011.  I do not have the results for review.  No interventions performed as per patient.  LABORATORY DATA: CBC Latest Ref Rng & Units 12/16/2019 09/10/2018 05/11/2018  WBC 3.4 - 10.8 x10E3/uL 5.7 7.7 7.2  Hemoglobin 13.0 - 17.7 g/dL 15.8 16.3 15.3  Hematocrit 37.5 - 51.0 % 48.0 47.8 44.5  Platelets 150 - 450 x10E3/uL 296 334 312    CMP Latest Ref Rng & Units 12/16/2019 09/10/2018 05/11/2018  Glucose 65 - 99 mg/dL 88 99 97  BUN 6 - 24 mg/dL '18 14 11  ' Creatinine 0.76 - 1.27 mg/dL 0.99 0.97 1.05  Sodium 134 - 144 mmol/L 137 137 139  Potassium 3.5 - 5.2 mmol/L 4.5 4.9 5.2  Chloride 96 - 106 mmol/L 103 101 102  CO2 20 - 29 mmol/L '21 28 26  ' Calcium 8.7 - 10.2 mg/dL 9.4 9.7 9.4  Total Protein 6.0 - 8.5 g/dL 7.0 7.5 -  Total Bilirubin 0.0 - 1.2 mg/dL 0.6 0.4 -  Alkaline Phos 39 - 117 IU/L 93 87 -  AST 0 - 40 IU/L 29 22 -  ALT 0 - 44 IU/L 48(H) 36 -    Lipid Panel     Component Value Date/Time   CHOL 146 12/16/2019 1106   TRIG 139 12/16/2019 1106   HDL 46 12/16/2019 1106   CHOLHDL 3.2 12/16/2019 1106   CHOLHDL 3.5 02/06/2017 0659   VLDL 36 (H) 02/06/2017 0659   LDLCALC 76 12/16/2019 1106   LABVLDL 24 12/16/2019 1106    No components found for: NTPROBNP No results for input(s): PROBNP in the last 8760 hours. Recent Labs    12/16/19 1106  TSH 0.551    BMP Recent Labs    12/16/19 1106  NA 137  K 4.5  CL 103  CO2 21  GLUCOSE 88  BUN 18  CREATININE 0.99  CALCIUM 9.4  GFRNONAA 92  GFRAA 106     HEMOGLOBIN A1C Lab Results  Component Value Date   HGBA1C 5.6 02/06/2017   MPG 114 02/06/2017    Cardiac Panel (last 3 results) No results for input(s): CKTOTAL, CKMB, TROPONINIHS, RELINDX in the last 72 hours.  CHOLESTEROL Recent Labs    12/16/19 1106  CHOL 146    Hepatic Function Panel Recent Labs    12/16/19 1106  PROT 7.0  ALBUMIN 4.6  AST  29  ALT 48*  ALKPHOS 93  BILITOT 0.6    IMPRESSION:    ICD-10-CM   1. Family history of premature CAD  Z82.49 CT CARDIAC SCORING  2. Precordial chest pain  R07.2 EKG 12-Lead    PCV ECHOCARDIOGRAM COMPLETE    PCV CARDIAC STRESS TEST    SARS-COV-2 RNA,(COVID-19) QUAL NAAT  3. Essential hypertension  I10   4. Mixed hyperlipidemia  E78.2      RECOMMENDATIONS: ISACC TURNEY is a 44 y.o. male whose past medical history and cardiac risk factors include: Hypertension, hyperlipidemia, premature coronary disease in the family.  Precordial chest pain:  Patient's chest discomfort has both typical and atypical features.  He has multiple cardiovascular risk factors as noted above.  And therefore cardiac evaluation was recommended.  Echocardiogram will be ordered to evaluate for structural heart disease and left ventricular systolic function.  Exercise treadmill stress test to evaluate for functional capacity and exercise-induced ischemia.  Patient has not had his Covid vaccine and therefore would recommend to screen for COVID-19 prior to GXT.  Coronary artery calcification score for further risk stratification.  Discussed working on improving his modifiable cardiovascular risk factors for primary prevention.  Family history of premature coronary artery disease: See above  Mixed hyperlipidemia: Continue statin therapy, most recent lipid profile reviewed, currently managed by primary team, does not endorse myalgias.   FINAL MEDICATION LIST END OF ENCOUNTER: No orders of the defined types were placed in this encounter.    There are no discontinued medications.   Current Outpatient Medications:  .  acetaminophen (TYLENOL) 500 MG tablet, Take 500 mg by mouth every 6 (six) hours as needed., Disp: , Rfl:  .  aspirin (ASPIRIN LOW DOSE) 81 MG EC tablet, TAKE 1 TABLET(81 MG) BY MOUTH DAILY. SWALLOW WHOLE, Disp: 90 tablet, Rfl: 1 .  cholecalciferol (VITAMIN D3) 25 MCG (1000 UNIT) tablet, Take 1 tablet (1,000 Units total) by mouth daily., Disp: 90 tablet, Rfl: 3 .  famotidine (PEPCID) 20 MG tablet, TAKE 1 TABLET(20 MG) BY MOUTH TWICE DAILY, Disp: 180 tablet, Rfl: 0 .  ibuprofen (ADVIL) 400 MG tablet, Take 400 mg by mouth every 6 (six) hours as needed., Disp: , Rfl:  .  lisinopril-hydrochlorothiazide (ZESTORETIC) 20-12.5 MG tablet, Take 1 tablet by mouth daily., Disp: 90 tablet, Rfl: 0 .  oxybutynin (DITROPAN) 5 MG tablet, Take 1 tablet (5 mg total) by mouth 2 (two) times daily. TAKE 1 TABLET(5 MG) BY MOUTH DAILY, Disp: 180 tablet, Rfl: 0 .  rosuvastatin (CRESTOR) 10 MG tablet, Take 1 tablet (10 mg total) by mouth daily., Disp: 90 tablet, Rfl: 1 .  Sodium Sulfate-Mag Sulfate-KCl (SUTAB) [phone] MG TABS, Take 1 kit by mouth as directed., Disp: 24 tablet, Rfl: 0 .  Sodium Sulfate-Mag Sulfate-KCl (SUTAB) [phone] MG TABS, Take 24 tablets by mouth as directed., Disp: 24 tablet, Rfl: 0  Orders Placed This Encounter  Procedures  . SARS-COV-2 RNA,(COVID-19) QUAL NAAT  . CT CARDIAC SCORING  . PCV CARDIAC STRESS TEST  . EKG 12-Lead  . PCV ECHOCARDIOGRAM COMPLETE    There are no Patient Instructions on file for this visit.   --Continue cardiac medications as reconciled in final medication list. --Return in about 6 weeks (around 02/23/2020) for review test results., re-evaluation of symptoms.. Or sooner if needed. --Continue follow-up with your primary care physician regarding the management of your other chronic comorbid conditions.  Patient's questions and concerns were addressed to his satisfaction. He voices  understanding of the instructions  provided during this encounter.   This note was created using a voice recognition software as a result there may be grammatical errors inadvertently enclosed that do not reflect the nature of this encounter. Every attempt is made to correct such errors.  Rex Kras, [place], [facility]  Pager: [phone] Office: [phone]

## 2020-01-19 ENCOUNTER — Ambulatory Visit: Payer: BC Managed Care – PPO

## 2020-01-19 DIAGNOSIS — R072 Precordial pain: Secondary | ICD-10-CM

## 2020-01-21 ENCOUNTER — Telehealth: Payer: Self-pay

## 2020-01-21 DIAGNOSIS — M25551 Pain in right hip: Secondary | ICD-10-CM | POA: Diagnosis not present

## 2020-01-21 DIAGNOSIS — M25552 Pain in left hip: Secondary | ICD-10-CM | POA: Diagnosis not present

## 2020-01-21 DIAGNOSIS — Z01812 Encounter for preprocedural laboratory examination: Secondary | ICD-10-CM | POA: Diagnosis not present

## 2020-01-21 NOTE — Telephone Encounter (Signed)
Patient called to cancel upcoming stress test and follow up appointment with you. He is having hip surgery on July 8th. He will call back to rescedule

## 2020-01-23 NOTE — Telephone Encounter (Signed)
Okay, please have him followup after his surgery.

## 2020-01-27 ENCOUNTER — Encounter: Payer: BC Managed Care – PPO | Admitting: Gastroenterology

## 2020-02-08 ENCOUNTER — Ambulatory Visit: Payer: BC Managed Care – PPO

## 2020-02-08 ENCOUNTER — Other Ambulatory Visit: Payer: Self-pay

## 2020-02-08 DIAGNOSIS — R072 Precordial pain: Secondary | ICD-10-CM | POA: Diagnosis not present

## 2020-02-08 DIAGNOSIS — Z01812 Encounter for preprocedural laboratory examination: Secondary | ICD-10-CM | POA: Diagnosis not present

## 2020-02-08 DIAGNOSIS — M25552 Pain in left hip: Secondary | ICD-10-CM | POA: Diagnosis not present

## 2020-02-09 ENCOUNTER — Encounter: Payer: BC Managed Care – PPO | Admitting: Gastroenterology

## 2020-02-11 ENCOUNTER — Encounter: Payer: Self-pay | Admitting: Medical

## 2020-02-28 ENCOUNTER — Other Ambulatory Visit: Payer: Self-pay | Admitting: Medical

## 2020-03-03 ENCOUNTER — Ambulatory Visit: Payer: BC Managed Care – PPO | Admitting: Cardiology

## 2020-03-05 HISTORY — PX: HIP ARTHROPLASTY: SHX981

## 2020-03-09 DIAGNOSIS — M1612 Unilateral primary osteoarthritis, left hip: Secondary | ICD-10-CM | POA: Diagnosis not present

## 2020-03-13 DIAGNOSIS — M25552 Pain in left hip: Secondary | ICD-10-CM | POA: Diagnosis not present

## 2020-03-21 DIAGNOSIS — M25552 Pain in left hip: Secondary | ICD-10-CM | POA: Diagnosis not present

## 2020-03-27 DIAGNOSIS — M25552 Pain in left hip: Secondary | ICD-10-CM | POA: Diagnosis not present

## 2020-04-03 DIAGNOSIS — M25552 Pain in left hip: Secondary | ICD-10-CM | POA: Diagnosis not present

## 2020-04-05 HISTORY — PX: HIP ARTHROPLASTY: SHX981

## 2020-04-12 DIAGNOSIS — M25552 Pain in left hip: Secondary | ICD-10-CM | POA: Diagnosis not present

## 2020-04-14 DIAGNOSIS — M25551 Pain in right hip: Secondary | ICD-10-CM | POA: Diagnosis not present

## 2020-04-14 DIAGNOSIS — Z01812 Encounter for preprocedural laboratory examination: Secondary | ICD-10-CM | POA: Diagnosis not present

## 2020-04-14 DIAGNOSIS — M1611 Unilateral primary osteoarthritis, right hip: Secondary | ICD-10-CM | POA: Diagnosis not present

## 2020-05-04 DIAGNOSIS — M87051 Idiopathic aseptic necrosis of right femur: Secondary | ICD-10-CM | POA: Diagnosis not present

## 2020-05-04 DIAGNOSIS — M1611 Unilateral primary osteoarthritis, right hip: Secondary | ICD-10-CM | POA: Diagnosis not present

## 2020-05-08 DIAGNOSIS — M87051 Idiopathic aseptic necrosis of right femur: Secondary | ICD-10-CM | POA: Diagnosis not present

## 2020-05-18 ENCOUNTER — Other Ambulatory Visit: Payer: Self-pay | Admitting: Medical

## 2020-05-18 DIAGNOSIS — R112 Nausea with vomiting, unspecified: Secondary | ICD-10-CM

## 2020-05-18 DIAGNOSIS — M87051 Idiopathic aseptic necrosis of right femur: Secondary | ICD-10-CM | POA: Diagnosis not present

## 2020-05-22 ENCOUNTER — Other Ambulatory Visit: Payer: Self-pay | Admitting: Medical

## 2020-05-25 DIAGNOSIS — M87051 Idiopathic aseptic necrosis of right femur: Secondary | ICD-10-CM | POA: Diagnosis not present

## 2020-06-01 DIAGNOSIS — M87051 Idiopathic aseptic necrosis of right femur: Secondary | ICD-10-CM | POA: Diagnosis not present

## 2020-06-08 DIAGNOSIS — M87051 Idiopathic aseptic necrosis of right femur: Secondary | ICD-10-CM | POA: Diagnosis not present

## 2020-06-15 ENCOUNTER — Other Ambulatory Visit: Payer: Self-pay

## 2020-06-15 ENCOUNTER — Encounter: Payer: Self-pay | Admitting: Medical

## 2020-06-15 ENCOUNTER — Ambulatory Visit: Payer: BC Managed Care – PPO | Admitting: Medical

## 2020-06-15 VITALS — BP 108/74 | HR 91 | Ht 69.0 in | Wt 230.2 lb

## 2020-06-15 DIAGNOSIS — Z2821 Immunization not carried out because of patient refusal: Secondary | ICD-10-CM

## 2020-06-15 DIAGNOSIS — I10 Essential (primary) hypertension: Secondary | ICD-10-CM

## 2020-06-15 DIAGNOSIS — K760 Fatty (change of) liver, not elsewhere classified: Secondary | ICD-10-CM | POA: Diagnosis not present

## 2020-06-15 DIAGNOSIS — R61 Generalized hyperhidrosis: Secondary | ICD-10-CM

## 2020-06-15 DIAGNOSIS — E785 Hyperlipidemia, unspecified: Secondary | ICD-10-CM

## 2020-06-15 DIAGNOSIS — R7989 Other specified abnormal findings of blood chemistry: Secondary | ICD-10-CM | POA: Insufficient documentation

## 2020-06-15 MED ORDER — ROSUVASTATIN CALCIUM 10 MG PO TABS: ORAL_TABLET | ORAL | 3 refills | Status: DC

## 2020-06-15 MED ORDER — LISINOPRIL-HYDROCHLOROTHIAZIDE 20-12.5 MG PO TABS: 1.0000 | ORAL_TABLET | Freq: Every day | ORAL | 3 refills | Status: DC

## 2020-06-15 MED ORDER — OXYBUTYNIN CHLORIDE 5 MG PO TABS: 5.0000 mg | ORAL_TABLET | Freq: Two times a day (BID) | ORAL | 3 refills | Status: DC

## 2020-06-15 NOTE — Progress Notes (Signed)
Subjective:  Scott Moss is a 44 y.o. male who presents for Chief Complaint  Patient presents with  . Follow-up    with fasting labs      At his physical in 12/2019, he was advised to begin vitamin D supplement due to low Vit D.  However, he is currently not taking the vit D.  He was taking it until ortho asked him not to use it around the time of his recent bilat hip surgeries.    Elevated LFTs - ultrasound 2020 showed fatty liver disease.   Here to recheck liver tests in light of medications including statin currently.  Compliant with Crestor 10mg  daily.     HTN - compliant with Lisinopril HCT  Has had recent bilat hip surgery, one in August and one in September.  Had done physical therapy.  Saw ortho yesterday.    No other aggravating or relieving factors.    No other c/o.  Past Medical History:  Diagnosis Date  . Arthritis   . Atypical chest pain 07/06/2015   Non-obstructive CAD on [facility] 04/2010.  [name] Chondromalacia of left knee 09/2012  . Complication of anesthesia   . Family history of cancer    multiple family members, multiple types.   Sees Dr. Marin Olp  . Family history of cancer   . Family history of premature CAD   . GERD (gastroesophageal reflux disease)   . H/O cardiovascular stress test 07/14/2015   myocardial perfusion scan, normal, EF 45-54% mildy reduced LV function, Dr. Oval Linsey  . H/O echocardiogram 07/18/2015   LV mild hypertrophy, 55-60%EF, Dr. Oval Linsey  . High cholesterol   . Hypertension   . Obesity   . Overweight   . PONV (postoperative nausea and vomiting)   . Testicular cancer ([place]) 02/2005   Dr. Marin Olp     The following portions of the patient's history were reviewed and updated as appropriate: allergies, current medications, past family history, past medical history, past social history, past surgical history and problem list.  ROS Otherwise as in subjective above  Objective: BP 108/74   Pulse 91   Ht 5\' 9"  (1.753 m)   Wt 230 lb 3.2 oz  (104.4 kg)   SpO2 97%   BMI 33.99 kg/m   General appearance: alert, no distress, well developed, well nourished Skin: unremarkable Abdomen: +bs, soft, non tender, non distended, no masses, no hepatomegaly, no splenomegaly Pulses: 2+ radial pulses, 2+ pedal pulses, normal cap refill Ext: no edema   Assessment: Encounter Diagnoses  Name Primary?  . Elevated LFTs   . Influenza vaccination declined Yes  . Primary hypertension   . Fatty liver   . Hyperlipidemia, unspecified hyperlipidemia type   . Hyperhidrosis      Plan: Elevated LFTs - fatty liver disease on 2020 ultrasound.   High risk medications with statin.  Repeat labs today.  If worsening, plan to check Hep B and Hep C, iron.    Advised low fat diet, low sugar diet, limit alcohol.  No hx/o hepatitis exposure or IV drug use, no prior transfusion  HTN - c/t same medication  Hyperlipidemia - c/t statin  Hyperhidrosis - c/t oxybutynin  Tayron was seen today for follow-up.  Diagnoses and all orders for this visit:  Influenza vaccination declined  Elevated LFTs -     Hepatic function panel  Primary hypertension  Fatty liver  Hyperlipidemia, unspecified hyperlipidemia type  Hyperhidrosis  Other orders -     lisinopril-hydrochlorothiazide (ZESTORETIC) 20-12.5 MG tablet; Take 1  tablet by mouth daily. -     rosuvastatin (CRESTOR) 10 MG tablet; TAKE 1 TABLET(10 MG) BY MOUTH DAILY -     oxybutynin (DITROPAN) 5 MG tablet; Take 1 tablet (5 mg total) by mouth 2 (two) times daily. TAKE 1 TABLET(5 MG) BY MOUTH DAILY    Follow up: pending labs

## 2020-06-16 ENCOUNTER — Other Ambulatory Visit: Payer: Self-pay | Admitting: Medical

## 2020-06-16 ENCOUNTER — Telehealth: Payer: Self-pay

## 2020-06-16 LAB — HEPATIC FUNCTION PANEL
ALT: 31 IU/L (ref 0–44)
AST: 24 IU/L (ref 0–40)
Albumin: 4.6 g/dL (ref 4.0–5.0)
Alkaline Phosphatase: 130 IU/L — ABNORMAL HIGH (ref 44–121)
Bilirubin Total: 0.3 mg/dL (ref 0.0–1.2)
Bilirubin, Direct: 0.1 mg/dL (ref 0.00–0.40)
Total Protein: 7.6 g/dL (ref 6.0–8.5)

## 2020-06-16 NOTE — Telephone Encounter (Signed)
The first question is what may be causing him to not sleep well?   See info below.  In the meantime, over the counter options include Melatonin, Benadryl.   Has he tried any of these?  With the list below, is there anything can try differently?   If he feels his issue is short term, I can try him on a little Ambien/Zolpidem short term.  CAUSES  Stress, anxiety, and depression. Poor sleeping habits. Distractions such as TV in the bedroom. Naps close to bedtime. Engaging in emotionally charged conversations before bed. Technical reading before sleep. Alcohol and other sedatives. They may make the problem worse. They can hurt normal sleep patterns and normal dream activity. Stimulants such as caffeine for several hours prior to bedtime. Pain syndromes and shortness of breath can cause insomnia. Exercise late at night.  You can promote easier sleeping by making lifestyle changes such as the following:  Sleep hygiene Sleep only as much as you need to feel rested and then get out of bed Keep a regular sleep schedule.  Aim to go to bed at the same time every night, and set an alarm clock to wake up at a fixed time each morning including weekends Use relaxation techniques. During your day, work to eliminate stress. When this is not possible use some of the previous suggestions to help reduce the anxiety that accompanies stressful situations. Avoid forcing sleep Exercise regularly at least 20 minutes, preferably 4-5 hours before bedtime Avoid caffeinated beverages after lunch Avoid alcohol near bedtime; no "night cap" Avoid smoking, especially in the evening Do not go to bed hungry; work on changing your diet and the time of your last meal. No night time snacks. Adjust bedroom environment to a cool, quiet, dark room Deal with you worries before bedtime.  Consider counseling for excessive stress, worry, and life situations.  I can provide resources and contact information for counselors if  needed.  Stimulus control Go to bed only when sleepy Do not watch television, read, eat, or worry while in bed.  Use bed only for sleep and sex Stop tedious detailed work at least one hour before bedtime. Get out of the bed if unable to fall asleep within 20 minutes and go to another room.  Return to bed only when sleepy.  Read or do some quiet activity. Keep the lights down. Wait until you feel sleepy and go back to bed.Repeat this step as many times as necessary throughout the night Do not take a nap during the day

## 2020-06-16 NOTE — Telephone Encounter (Signed)
Patient wants to know if there is somethig you recommend he take for sleep otc. He has tried Melatonin, nyquil z gummies. Please advise.

## 2020-06-19 ENCOUNTER — Other Ambulatory Visit: Payer: Self-pay | Admitting: Medical

## 2020-06-19 MED ORDER — ZOLPIDEM TARTRATE 10 MG PO TABS: 10.0000 mg | ORAL_TABLET | Freq: Every evening | ORAL | 0 refills | Status: DC | PRN

## 2020-06-19 NOTE — Telephone Encounter (Signed)
This message has been sent to patient via mychart.

## 2020-08-16 DIAGNOSIS — M160 Bilateral primary osteoarthritis of hip: Secondary | ICD-10-CM | POA: Diagnosis not present

## 2020-08-22 ENCOUNTER — Other Ambulatory Visit: Payer: Self-pay

## 2020-08-22 ENCOUNTER — Encounter: Payer: Self-pay | Admitting: Medical

## 2020-08-22 ENCOUNTER — Other Ambulatory Visit (INDEPENDENT_AMBULATORY_CARE_PROVIDER_SITE_OTHER): Payer: BC Managed Care – PPO

## 2020-08-22 ENCOUNTER — Telehealth: Payer: BC Managed Care – PPO | Admitting: Medical

## 2020-08-22 VITALS — Temp 97.7°F | Ht 69.0 in | Wt 224.0 lb

## 2020-08-22 DIAGNOSIS — R059 Cough, unspecified: Secondary | ICD-10-CM | POA: Insufficient documentation

## 2020-08-22 DIAGNOSIS — R5383 Other fatigue: Secondary | ICD-10-CM

## 2020-08-22 DIAGNOSIS — H9203 Otalgia, bilateral: Secondary | ICD-10-CM | POA: Diagnosis not present

## 2020-08-22 DIAGNOSIS — J029 Acute pharyngitis, unspecified: Secondary | ICD-10-CM | POA: Insufficient documentation

## 2020-08-22 LAB — POC COVID19 BINAXNOW: SARS Coronavirus 2 Ag: POSITIVE — AB

## 2020-08-22 LAB — POCT RAPID STREP A (OFFICE): Rapid Strep A Screen: NEGATIVE

## 2020-08-22 MED ORDER — EMERGEN-C IMMUNE PLUS PO PACK: 1.0000 | PACK | Freq: Two times a day (BID) | ORAL | 0 refills | Status: DC

## 2020-08-22 MED ORDER — HYDROCOD POLST-CPM POLST ER 10-8 MG/5ML PO SUER: 5.0000 mL | Freq: Two times a day (BID) | ORAL | 0 refills | Status: DC

## 2020-08-22 NOTE — Progress Notes (Signed)
Subjective:     Patient ID: Scott Moss, male   DOB: 1976-02-19, 45 y.o.   MRN: [id number]  This visit type was conducted due to national recommendations for restrictions regarding the COVID-19 Pandemic (e.g. social distancing) in an effort to limit this patient's exposure and mitigate transmission in our community.  Due to their co-morbid illnesses, this patient is at least at moderate risk for complications without adequate follow up.  This format is felt to be most appropriate for this patient at this time.    Documentation for virtual audio and video telecommunications through [place] encounter:  The patient was located at home. The provider was located in the office. The patient did consent to this visit and is aware of possible charges through their insurance for this visit.  The other persons participating in this telemedicine service were none. Time spent on call was 20 minutes and in review of previous records 20 minutes total.  This virtual service is not related to other E/M service within previous 7 days.   HPI Chief Complaint  Patient presents with  . URI    Fever, headache, cough, sore throat. Started 08/18/20. Had J&J vaccine    Virtual consult for illness.  He notes symptoms began 08/18/2020.  He is vaccinated with Wynetta Emery & Wynetta Emery vaccine  He notes lots of coughing, fatigue, in the bed a lot tired, headache between the eyes, hurts to swallow, throat feels like knives, ears hurting, some chills, some loose stool.  No nausea vomiting, no shortness of breath or wheezing.  Everybody in his household is sick.  He did a home test on Friday that was negative for COVID the first day of symptoms.  Using nyquil, psyllium and cepachol.  No other aggravating or relieving factors. No other complaint.  Past Medical History:  Diagnosis Date  . Arthritis   . Atypical chest pain 07/06/2015   Non-obstructive CAD on [facility] 04/2010.  [name] Chondromalacia of left knee 09/2012  .  Complication of anesthesia   . Family history of cancer    multiple family members, multiple types.   Sees Dr. Marin Olp  . Family history of cancer   . Family history of premature CAD   . GERD (gastroesophageal reflux disease)   . H/O cardiovascular stress test 07/14/2015   myocardial perfusion scan, normal, EF 45-54% mildy reduced LV function, Dr. Oval Linsey  . H/O echocardiogram 07/18/2015   LV mild hypertrophy, 55-60%EF, Dr. Oval Linsey  . High cholesterol   . Hypertension   . Obesity   . Overweight   . PONV (postoperative nausea and vomiting)   . Testicular cancer ([place]) 02/2005   Dr. Marin Olp   Review of Systems As in subjective    Objective:   Physical Exam Due to coronavirus pandemic stay at home measures, patient visit was virtual and they were not examined in person.   Temp 97.7 F (36.5 C)   Ht 5\' 9"  (1.753 m)   Wt 224 lb (101.6 kg)   BMI 33.08 kg/m   General: No acute distress Coughing quite a bit throughout the interview, no obvious wheezing Unable to completely answer questions at times due to all the coughing     Assessment:     Encounter Diagnoses  Name Primary?  . Cough Yes  . Sore throat   . Fatigue, unspecified type        Plan:     We discussed symptoms and concerns.  He will come in this afternoon for testing for both COVID  and strep.[name]  He notes a pretty significant throat pain and ear pain.  We discussed possible differential which could include strep throat, COVID, common cold or other  General recommendations if you have respiratory symptoms: We recommend you rest, hydrate well with water and clear fluids throughout the day such as water, soup broth, ice chips, or possibly pedialyte or G2 no sugar gatorade.   You can use Tylenol over the counter for pain or fever every 4 - 6 hours You can use over the counter Delsym or mucinex DM for cough unless your provider prescribed a cough medication already. You can use over the counter Emetrol over the  counter for nausea.    Consider EmergenC Immune plus vitamin pack over the counter which contains extra vitamin C, vitamin D, and zinc.  If you are having trouble breathing, if you are very weak, have high fever 103 or higher consistently despite Tylenol, or uncontrollable nausea and vomiting, then call or go to the emergency department.    Covid symptoms such as fatigue and cough can linger over 2 weeks, even after the initial fever, aches, chills, and other initial symptoms.   Self Quarantine and Isolation: Log onto [employer] for up to date quarantine recommendations.   [email or web address].org/   If you test Covid +, regardless of vaccination status:  Stay home for 5 days. If you have no symptoms or your symptoms are resolving after 5 days, then you can leave your house. Continue to wear a mask around others for 5 additional days. If you have a fever, continue to stay home until your fever resolves.  This could take 7-10 days from onset of symptoms or longer in some cases. If you have lots of coughing, sneezing, and significant runny nose and congestion, continue to stay at home until your symptoms are resolving   If you were exposed to someone with Covid: If you: Have been boosted OR Completed the primary series of Pfizer or Moderna vaccine within the last 6 months OR Completed the primary series of J&J vaccine within the last 2 months  Wear a mask around others for 10 days.  Test on day 5, if possible.   If you test positive on day 5 or more after exposure, and no symptoms, then wear a mask around others for 10 days  If you test positive and have symptoms, then follow the positive covid result isolation recommendations above If you test negative and have no symptoms on day 5 after exposure, then you may end isolation and be around others    If you were exposed to someone with Covid: If you: Completed the primary  series of Pfizer or Moderna vaccine over 6 months ago and are not boosted OR Completed the primary series of J&J over 2 months ago and are not boosted OR Are unvaccinated  Stay home for 5 days. After that continue to wear a mask around others for 5 additional days. If you can't quarantine you must wear a mask for 10 days. Test on day 5 if possible. If you test positive on day 5 or more after exposure, and no symptoms, then wear a mask around others for 10 days  If you test positive and have symptoms, then follow the positive covid result isolation recommendations above If you test negative and have no symptoms on day 5 after exposure, then you can end isolation but wear a mask around others for 5 more days   If you test Covid negative, but have  respiratory symptoms:  Continue to wear a mask around others for 5 additional days. If you have a fever, continue to stay home until your fever resolves.   If you have lots of coughing, sneezing, and significant runny nose and congestion, continue to stay at home until your symptoms are resolving   Isolation means avoiding contact with people as much as possible.   Particularly in your house, isolate your self from others in a separate room, wear a mask when possible in the room, particularly if coughing a lot.   Have others bring food, water, medications, etc., to your door, but avoid direct contact with your household contacts during this time to avoid spreading the infection to them.   If you have a separate bathroom and living quarters during the next 2 weeks away from others, that would be preferable.    If you can't completely isolate, then wear a mask, wash hands frequently with soap and water for at least 15 seconds, minimize close contact with others, and have a friend or family member check regularly from a distance to make sure you are not getting seriously worse.     You should not be going out in public, should not be going to stores, to  work or other public places until all your symptoms have resolved.  One of the goals is to limit spread to high risk people; people that are older and elderly, people with multiple health issues like diabetes, heart disease, lung disease, and anybody that has weakened immune systems such as people with cancer or on immunosuppressive therapy.   This virtual visit began with video but was interrupted and remainder had to be done by phone.  Less than 50% of time was completed on the video.  Scott Moss was seen today for uri.  Diagnoses and all orders for this visit:  Cough  Sore throat  Fatigue, unspecified type  Other orders -     chlorpheniramine-HYDROcodone (TUSSIONEX PENNKINETIC ER) 10-8 MG/5ML SUER; Take 5 mLs by mouth 2 (two) times daily. -     Multiple Vitamins-Minerals (EMERGEN-C IMMUNE PLUS) PACK; Take 1 tablet by mouth 2 (two) times daily.  f/u this afternoon for covid and strep testing

## 2020-08-23 ENCOUNTER — Other Ambulatory Visit: Payer: Self-pay

## 2020-08-23 ENCOUNTER — Other Ambulatory Visit: Payer: Self-pay | Admitting: Medical

## 2020-08-23 DIAGNOSIS — H9203 Otalgia, bilateral: Secondary | ICD-10-CM

## 2020-08-23 DIAGNOSIS — J029 Acute pharyngitis, unspecified: Secondary | ICD-10-CM

## 2020-08-23 DIAGNOSIS — R059 Cough, unspecified: Secondary | ICD-10-CM

## 2020-08-23 MED ORDER — AZITHROMYCIN 250 MG PO TABS: 250.0000 mg | ORAL_TABLET | Freq: Two times a day (BID) | ORAL | 0 refills | Status: DC

## 2020-08-23 NOTE — Progress Notes (Signed)
a 

## 2020-08-24 ENCOUNTER — Other Ambulatory Visit (HOSPITAL_COMMUNITY): Payer: Self-pay | Admitting: Internal Medicine

## 2020-08-24 ENCOUNTER — Telehealth: Payer: Self-pay | Admitting: *Deleted

## 2020-08-24 ENCOUNTER — Ambulatory Visit (HOSPITAL_COMMUNITY)
Admission: RE | Admit: 2020-08-24 | Discharge: 2020-08-24 | Disposition: A | Discharge status: Home / Self Care | Payer: BC Managed Care – PPO | Source: Ambulatory Visit | Attending: Pulmonary Disease | Admitting: Pulmonary Disease

## 2020-08-24 ENCOUNTER — Encounter (HOSPITAL_COMMUNITY): Payer: Self-pay

## 2020-08-24 DIAGNOSIS — I10 Essential (primary) hypertension: Secondary | ICD-10-CM | POA: Diagnosis not present

## 2020-08-24 DIAGNOSIS — U071 COVID-19: Secondary | ICD-10-CM | POA: Insufficient documentation

## 2020-08-24 MED ORDER — SODIUM CHLORIDE 0.9 % IV SOLN: INTRAVENOUS | Status: DC | PRN

## 2020-08-24 MED ORDER — METHYLPREDNISOLONE SODIUM SUCC 125 MG IJ SOLR: 125.0000 mg | Freq: Once | INTRAMUSCULAR | Status: DC | PRN

## 2020-08-24 MED ORDER — ALBUTEROL SULFATE HFA 108 (90 BASE) MCG/ACT IN AERS: 2.0000 | INHALATION_SPRAY | Freq: Once | RESPIRATORY_TRACT | Status: DC | PRN

## 2020-08-24 MED ORDER — EPINEPHRINE 0.3 MG/0.3ML IJ SOAJ: 0.3000 mg | Freq: Once | INTRAMUSCULAR | Status: DC | PRN

## 2020-08-24 MED ORDER — DIPHENHYDRAMINE HCL 50 MG/ML IJ SOLN: 50.0000 mg | Freq: Once | INTRAMUSCULAR | Status: DC | PRN

## 2020-08-24 MED ORDER — SOTROVIMAB 500 MG/8ML IV SOLN
500.0000 mg | Freq: Once | INTRAVENOUS | Status: AC
  Administered 2020-08-24: 500 mg via INTRAVENOUS

## 2020-08-24 MED ORDER — FAMOTIDINE IN NACL 20-0.9 MG/50ML-% IV SOLN: 20.0000 mg | Freq: Once | INTRAVENOUS | Status: DC | PRN

## 2020-08-24 NOTE — Telephone Encounter (Signed)
Called to discuss with patient about COVID-19 symptoms and the use of one of the available treatments for those with mild to moderate Covid symptoms and at a high risk of hospitalization.  Pt appears to qualify for outpatient treatment due to co-morbid conditions and/or a member of an at-risk group in accordance with the FDA Emergency Use Authorization.   Current symptoms: cough, congestion, ST, loss of taste/smell. Patient was non stop coughing during our conversation/sounded weak.   Symptom onset: 08/18/20 Today is day 7 of symptoms. Vaccinated: Yes  Booster? no Immunocompromised? no Qualifiers: HTN, Hx Cancer, BMI>25   Scott Moss, Gillis Ends

## 2020-08-24 NOTE — Progress Notes (Signed)
Diagnosis: COVID-19  Physician: Dr. Patrick Wright  Procedure: Cov[facility] Infusion Clinic Med: Sotrovimab infusion - Provided patient with sotrovimab fact sheet for patients, parents, and caregivers prior to infusion.   Complications: No immediate complications noted  Discharge: Discharged home    

## 2020-08-24 NOTE — Discharge Instructions (Signed)

## 2020-08-24 NOTE — Progress Notes (Unsigned)
I connected by phone with Scott Moss on 08/24/2020 at 12:04 PM to discuss the potential use of a new treatment for mild to moderate COVID-19 viral infection in non-hospitalized patients.  This patient is a 45 y.o. male that meets the FDA criteria for Emergency Use Authorization of COVID monoclonal antibody sotrovimab.  Has a (+) direct SARS-CoV-2 viral test result  Has mild or moderate COVID-19   Is NOT hospitalized due to COVID-19  Is within 10 days of symptom onset  Has at least one of the high risk factor(s) for progression to severe COVID-19 and/or hospitalization as defined in EUA.  Specific high risk criteria : Cardiovascular disease or hypertension   I have spoken and communicated the following to the patient or parent/caregiver regarding COVID monoclonal antibody treatment:  1. FDA has authorized the emergency use for the treatment of mild to moderate COVID-19 in adults and pediatric patients with positive results of direct SARS-CoV-2 viral testing who are 21 years of age and older weighing at least 40 kg, and who are at high risk for progressing to severe COVID-19 and/or hospitalization.  2. The significant known and potential risks and benefits of COVID monoclonal antibody, and the extent to which such potential risks and benefits are unknown.  3. Information on available alternative treatments and the risks and benefits of those alternatives, including clinical trials.  4. Patients treated with COVID monoclonal antibody should continue to self-isolate and use infection control measures (e.g., wear mask, isolate, social distance, avoid sharing personal items, clean and disinfect "high touch" surfaces, and frequent handwashing) according to CDC guidelines.   5. The patient or parent/caregiver has the option to accept or refuse COVID monoclonal antibody treatment.  After reviewing this information with the patient, the patient has agreed to receive one of the available covid 19  monoclonal antibodies and will be provided an appropriate fact sheet prior to infusion.  Alan Ripper, [place]

## 2020-08-24 NOTE — Progress Notes (Signed)
Patient reviewed Fact Sheet for Patients, Parents, and Caregivers for Emergency Use Authorization (EUA) of sotrovimab for the Treatment of Coronavirus. Patient also reviewed and is agreeable to the estimated cost of treatment. Patient is agreeable to proceed.   

## 2020-08-27 ENCOUNTER — Other Ambulatory Visit: Payer: Self-pay | Admitting: Medical

## 2020-08-27 DIAGNOSIS — R112 Nausea with vomiting, unspecified: Secondary | ICD-10-CM

## 2020-09-20 ENCOUNTER — Ambulatory Visit: Payer: BC Managed Care – PPO | Admitting: Medical

## 2020-09-21 ENCOUNTER — Ambulatory Visit: Payer: BC Managed Care – PPO | Admitting: Medical

## 2020-09-22 ENCOUNTER — Ambulatory Visit: Payer: BC Managed Care – PPO | Admitting: Family Medicine

## 2020-09-22 ENCOUNTER — Encounter: Payer: Self-pay | Admitting: Family Medicine

## 2020-09-22 ENCOUNTER — Other Ambulatory Visit: Payer: Self-pay

## 2020-09-22 VITALS — BP 110/68 | HR 80 | Temp 98.4°F | Wt 232.2 lb

## 2020-09-22 DIAGNOSIS — Z8616 Personal history of COVID-19: Secondary | ICD-10-CM

## 2020-09-22 DIAGNOSIS — H66003 Acute suppurative otitis media without spontaneous rupture of ear drum, bilateral: Secondary | ICD-10-CM | POA: Diagnosis not present

## 2020-09-22 MED ORDER — AMOXICILLIN-POT CLAVULANATE 875-125 MG PO TABS: 1.0000 | ORAL_TABLET | Freq: Two times a day (BID) | ORAL | 0 refills | Status: DC

## 2020-09-22 NOTE — Progress Notes (Signed)
   Subjective:    Patient ID: ARTAVIUS STEARNS, male    DOB: 1975-09-09, 45 y.o.   MRN: [id number]  HPI He complains of a 2-week history of decreased hearing from the left ear with popping sensation but no pain, fever, chills, sore throat. He states that this occurred after he had a bout of COVID.   Review of Systems     Objective:   Physical Exam Alert and in no distress. Both tympanic membranes show retraction with fluid behind the left TM. Slight erythema noted. Neck is supple without adenopathy or thyromegaly. Throat is clear.       Assessment & Plan:  Non-recurrent acute suppurative otitis media of both ears without spontaneous rupture of tympanic membranes  History of COVID-19 Recommend he chew gum or use a decongestant as well as yawning to help with opening up the eustachian tubes. Recheck here in 2 weeks since the anatomy of the tympanic membrane is questionable.

## 2020-10-06 ENCOUNTER — Ambulatory Visit: Payer: BC Managed Care – PPO | Admitting: Family Medicine

## 2020-11-07 ENCOUNTER — Encounter: Payer: Self-pay | Admitting: Family Medicine

## 2020-11-07 ENCOUNTER — Telehealth: Payer: BC Managed Care – PPO | Admitting: Family Medicine

## 2020-11-07 ENCOUNTER — Other Ambulatory Visit: Payer: Self-pay

## 2020-11-07 VITALS — Temp 101.8°F | Wt 224.0 lb

## 2020-11-07 DIAGNOSIS — Z8616 Personal history of COVID-19: Secondary | ICD-10-CM | POA: Insufficient documentation

## 2020-11-07 DIAGNOSIS — H66003 Acute suppurative otitis media without spontaneous rupture of ear drum, bilateral: Secondary | ICD-10-CM | POA: Diagnosis not present

## 2020-11-07 DIAGNOSIS — J309 Allergic rhinitis, unspecified: Secondary | ICD-10-CM

## 2020-11-07 MED ORDER — AMOXICILLIN-POT CLAVULANATE 875-125 MG PO TABS: 1.0000 | ORAL_TABLET | Freq: Two times a day (BID) | ORAL | 0 refills | Status: DC

## 2020-11-07 NOTE — Progress Notes (Signed)
   Subjective:    Patient ID: Scott Moss, male    DOB: 05-09-1976, 45 y.o.   MRN: [id number]  HPI Documentation for virtual audio and video telecommunications through [place] encounter: The patient was located at home. 2 patient identifiers used.  The provider was located in the office. The patient did consent to this visit and is aware of possible charges through their insurance for this visit. The other persons participating in this telemedicine service were none. Time spent on call was 5 minutes and in review of previous records >20 minutes total for counseling and coordination of care. This virtual service is not related to other E/M service within previous 7 days. He states that on Sunday he developed sinus pressure, sore throat, left earache, postnasal drainage with malaise and fatigue.  He says his temperature got up to 102.  Does have a previous history of sinus infections as well as allergies.  He is taking Claritin and occasionally using Sudafed.  He does not smoke.  He has a history of having Covid.  Review of Systems     Objective:   Physical Exam Alert and toxic appearing with nasal congestion.       Assessment & Plan:  Non-recurrent acute suppurative otitis media of both ears without spontaneous rupture of tympanic membranes - Plan: amoxicillin-clavulanate (AUGMENTIN) 875-125 MG tablet  Allergic rhinitis, unspecified seasonality, unspecified trigger  History of COVID-19 He is to take all the antibiotic and if not totally back to normal when he finishes, he is to call.

## 2020-11-09 ENCOUNTER — Encounter: Payer: Self-pay | Admitting: Family Medicine

## 2020-11-09 ENCOUNTER — Telehealth: Payer: Self-pay | Admitting: Family Medicine

## 2020-11-09 NOTE — Telephone Encounter (Signed)
Pt called and wanted to know if he can get a note to return to work on Monday emailed to him

## 2020-11-09 NOTE — Telephone Encounter (Signed)
Give him a note.  He looks toxic and I am sure he felt miserable

## 2020-11-24 ENCOUNTER — Other Ambulatory Visit: Payer: Self-pay | Admitting: Medical

## 2020-11-24 DIAGNOSIS — R112 Nausea with vomiting, unspecified: Secondary | ICD-10-CM

## 2020-12-18 ENCOUNTER — Encounter: Payer: BC Managed Care – PPO | Admitting: Medical

## 2021-01-04 ENCOUNTER — Encounter: Payer: Self-pay | Admitting: Gastroenterology

## 2021-02-23 ENCOUNTER — Ambulatory Visit (AMBULATORY_SURGERY_CENTER): Payer: BC Managed Care – PPO

## 2021-02-23 VITALS — Ht 69.0 in | Wt 220.0 lb

## 2021-02-23 DIAGNOSIS — Z1211 Encounter for screening for malignant neoplasm of colon: Secondary | ICD-10-CM

## 2021-02-23 NOTE — Progress Notes (Signed)
No egg or soy allergy known to patient  No issues with past sedation with any surgeries or procedures Patient denies ever being told they had issues or difficulty with intubation  No FH of Malignant Hyperthermia No diet pills per patient No home 02 use per patient  No blood thinners per patient  Pt denies issues with constipation  No A fib or A flutter  EMMI video to pt or via [facility] 19 guidelines implemented in [place] today with Pt and RN  Pt is fully vaccinated  for Covid    Virtual visit, instructions mailed and sent in my chart.  Pt has sutabs on hand from last year when he had to cx his procedure.   Due to the COVID-19 pandemic we are asking patients to follow certain guidelines.  Pt aware of COVID protocols and [facility] guidelines

## 2021-02-24 ENCOUNTER — Other Ambulatory Visit: Payer: Self-pay | Admitting: Medical

## 2021-02-24 DIAGNOSIS — R112 Nausea with vomiting, unspecified: Secondary | ICD-10-CM

## 2021-03-08 ENCOUNTER — Encounter: Payer: Self-pay | Admitting: Certified Registered Nurse Anesthetist

## 2021-03-09 ENCOUNTER — Ambulatory Visit (AMBULATORY_SURGERY_CENTER): Payer: BC Managed Care – PPO | Admitting: Gastroenterology

## 2021-03-09 ENCOUNTER — Encounter: Payer: Self-pay | Admitting: Gastroenterology

## 2021-03-09 ENCOUNTER — Other Ambulatory Visit: Payer: Self-pay

## 2021-03-09 VITALS — BP 108/60 | HR 69 | Temp 97.8°F | Resp 17 | Ht 69.0 in | Wt 220.0 lb

## 2021-03-09 DIAGNOSIS — K5730 Diverticulosis of large intestine without perforation or abscess without bleeding: Secondary | ICD-10-CM

## 2021-03-09 DIAGNOSIS — Z1211 Encounter for screening for malignant neoplasm of colon: Secondary | ICD-10-CM | POA: Diagnosis not present

## 2021-03-09 MED ORDER — SODIUM CHLORIDE 0.9 % IV SOLN: 500.0000 mL | Freq: Once | INTRAVENOUS | Status: DC

## 2021-03-09 NOTE — Op Note (Signed)
[facility] Patient Name: Scott Moss Procedure Date: 03/09/2021 10:10 AM MRN: [id number] Endoscopist: Gerrit Heck , MD Age: 45 Referring MD:  Date of Birth: 09-19-1975 Gender: Male Account #: [id number] Procedure:                Colonoscopy Indications:              Screening for colorectal malignant neoplasm Medicines:                Monitored Anesthesia Care Procedure:                Pre-Anesthesia Assessment:                           - Prior to the procedure, a History and Physical                            was performed, and patient medications and                            allergies were reviewed. The patient's tolerance of                            previous anesthesia was also reviewed. The risks                            and benefits of the procedure and the sedation                            options and risks were discussed with the patient.                            All questions were answered, and informed consent                            was obtained. Prior Anticoagulants: The patient has                            taken no previous anticoagulant or antiplatelet                            agents. ASA Grade Assessment: II - A patient with                            mild systemic disease. After reviewing the risks                            and benefits, the patient was deemed in                            satisfactory condition to undergo the procedure.                           After obtaining informed consent, the colonoscope  was passed under direct vision. Throughout the                            procedure, the patient's blood pressure, pulse, and                            oxygen saturations were monitored continuously. The                            Olympus CF-HQ190L Colonoscope was introduced                            through the anus and advanced to the the terminal                            ileum. The colonoscopy  was performed without                            difficulty. The patient tolerated the procedure                            well. The quality of the bowel preparation was                            good. The terminal ileum, ileocecal valve,                            appendiceal orifice, and rectum were photographed. Scope In: 10:26:10 AM Scope Out: 10:38:35 AM Scope Withdrawal Time: 0 hours 10 minutes 56 seconds  Total Procedure Duration: 0 hours 12 minutes 25 seconds  Findings:                 The perianal and digital rectal examinations were                            normal.                           A few small-mouthed diverticula were found in the                            sigmoid colon and descending colon.                           The exam was otherwise normal throughout the                            remainder of the colon.                           The retroflexed view of the distal rectum and anal                            verge was normal and showed no anal or rectal  abnormalities.                           The terminal ileum appeared normal. Complications:            No immediate complications. Estimated Blood Loss:     Estimated blood loss: none. Impression:               - Diverticulosis in the sigmoid colon and in the                            descending colon. Otherwise normal colonoscopy.                           - The distal rectum and anal verge are normal on                            retroflexion view.                           - The examined portion of the ileum was normal.                           - No specimens collected. Recommendation:           - Patient has a contact number available for                            emergencies. The signs and symptoms of potential                            delayed complications were discussed with the                            patient. Return to normal activities tomorrow.                             Written discharge instructions were provided to the                            patient.                           - Resume previous diet.                           - Continue present medications.                           - Repeat colonoscopy in 10 years for screening                            purposes.                           - Return to GI office PRN. Gerrit Heck, MD 03/09/2021 10:42:49 AM

## 2021-03-09 NOTE — Patient Instructions (Signed)
YOU HAD AN ENDOSCOPIC PROCEDURE TODAY AT THE [facility]:   Refer to the procedure report that was given to you for any specific questions about what was found during the examination.  If the procedure report does not answer your questions, please call your gastroenterologist to clarify.  If you requested that your care partner not be given the details of your procedure findings, then the procedure report has been included in a sealed envelope for you to review at your convenience later.  YOU SHOULD EXPECT: Some feelings of bloating in the abdomen. Passage of more gas than usual.  Walking can help get rid of the air that was put into your GI tract during the procedure and reduce the bloating. If you had a lower endoscopy (such as a colonoscopy or flexible sigmoidoscopy) you may notice spotting of blood in your stool or on the toilet paper. If you underwent a bowel prep for your procedure, you may not have a normal bowel movement for a few days.  Please Note:  You might notice some irritation and congestion in your nose or some drainage.  This is from the oxygen used during your procedure.  There is no need for concern and it should clear up in a day or so.  SYMPTOMS TO REPORT IMMEDIATELY:   Following lower endoscopy (colonoscopy or flexible sigmoidoscopy):  Excessive amounts of blood in the stool  Significant tenderness or worsening of abdominal pains  Swelling of the abdomen that is new, acute  Fever of 100F or higher   Following upper endoscopy (EGD)  Vomiting of blood or coffee ground material  New chest pain or pain under the shoulder blades  Painful or persistently difficult swallowing  New shortness of breath  Fever of 100F or higher  Black, tarry-looking stools  For urgent or emergent issues, a gastroenterologist can be reached at any hour by calling [phone]. Do not use MyChart messaging for urgent concerns.    DIET:  We do recommend a small meal at first, but  then you may proceed to your regular diet.  Drink plenty of fluids but you should avoid alcoholic beverages for 24 hours.  ACTIVITY:  You should plan to take it easy for the rest of today and you should NOT DRIVE or use heavy machinery until tomorrow (because of the sedation medicines used during the test).    FOLLOW UP: Our staff will call the number listed on your records 48-72 hours following your procedure to check on you and address any questions or concerns that you may have regarding the information given to you following your procedure. If we do not reach you, we will leave a message.  We will attempt to reach you two times.  During this call, we will ask if you have developed any symptoms of COVID 19. If you develop any symptoms (ie: fever, flu-like symptoms, shortness of breath, cough etc.) before then, please call [phone].  If you test positive for Covid 19 in the 2 weeks post procedure, please call and report this information to us.    If any biopsies were taken you will be contacted by phone or by letter within the next 1-3 weeks.  Please call us at [phone] if you have not heard about the biopsies in 3 weeks.    SIGNATURES/CONFIDENTIALITY: You and/or your care partner have signed paperwork which will be entered into your electronic medical record.  These signatures attest to the fact that that the information above on   your After Visit Summary has been reviewed and is understood.  Full responsibility of the confidentiality of this discharge information lies with you and/or your care-partner. 

## 2021-03-09 NOTE — Progress Notes (Signed)
Pt's states no medical or surgical changes since previsit or office visit. 

## 2021-03-09 NOTE — Progress Notes (Signed)
VS taken by C.W. 

## 2021-03-09 NOTE — Progress Notes (Signed)
Report given to PACU, vss 

## 2021-03-13 ENCOUNTER — Telehealth: Payer: Self-pay | Admitting: *Deleted

## 2021-03-13 NOTE — Telephone Encounter (Signed)
  Follow up Call-  Call back number 03/09/2021  Post procedure Call Back phone  # [phone]  Permission to leave phone message Yes  Some recent data might be hidden     Patient questions:  Do you have a fever, pain , or abdominal swelling? No. Pain Score  0 *  Have you tolerated food without any problems? Yes.    Have you been able to return to your normal activities? Yes.    Do you have any questions about your discharge instructions: Diet   No. Medications  No. Follow up visit  No.  Do you have questions or concerns about your Care? No.  Actions: * If pain score is 4 or above: No action needed, pain <4.

## 2021-04-11 ENCOUNTER — Encounter: Payer: BC Managed Care – PPO | Admitting: Medical

## 2021-04-12 ENCOUNTER — Ambulatory Visit: Payer: BC Managed Care – PPO | Admitting: Podiatry

## 2021-05-02 ENCOUNTER — Telehealth: Payer: BC Managed Care – PPO | Admitting: Medical

## 2021-05-02 ENCOUNTER — Encounter: Payer: Self-pay | Admitting: Medical

## 2021-05-02 ENCOUNTER — Other Ambulatory Visit: Payer: Self-pay

## 2021-05-02 VITALS — Wt 224.0 lb

## 2021-05-02 DIAGNOSIS — R059 Cough, unspecified: Secondary | ICD-10-CM

## 2021-05-02 DIAGNOSIS — J3489 Other specified disorders of nose and nasal sinuses: Secondary | ICD-10-CM | POA: Diagnosis not present

## 2021-05-02 DIAGNOSIS — H9203 Otalgia, bilateral: Secondary | ICD-10-CM | POA: Diagnosis not present

## 2021-05-02 DIAGNOSIS — J029 Acute pharyngitis, unspecified: Secondary | ICD-10-CM

## 2021-05-02 MED ORDER — HYDROCOD POLST-CPM POLST ER 10-8 MG/5ML PO SUER: 5.0000 mL | Freq: Two times a day (BID) | ORAL | 0 refills | Status: DC | PRN

## 2021-05-02 MED ORDER — AMOXICILLIN 875 MG PO TABS: 875.0000 mg | ORAL_TABLET | Freq: Two times a day (BID) | ORAL | 0 refills | Status: AC

## 2021-05-02 NOTE — Progress Notes (Signed)
Subjective:     Patient ID: Scott Moss, male   DOB: 04-30-1976, 45 y.o.   MRN: [id number]  This visit type was conducted due to national recommendations for restrictions regarding the COVID-19 Pandemic (e.g. social distancing) in an effort to limit this patient's exposure and mitigate transmission in our community.  Due to their co-morbid illnesses, this patient is at least at moderate risk for complications without adequate follow up.  This format is felt to be most appropriate for this patient at this time.    Documentation for virtual audio and video telecommunications through [place] encounter:  The patient was located at home. The provider was located in the office. The patient did consent to this visit and is aware of possible charges through their insurance for this visit.  The other persons participating in this telemedicine service were none. Time spent on call was 20 minutes and in review of previous records 20 minutes total.  This virtual service is not related to other E/M service within previous 7 days.   HPI Chief Complaint  Patient presents with   coughing and sore throat    Coughing and sore throat x 3 weeks. Taking nquil. 2 covid test were negative   He notes 3 week hx/o cough, congestion, nose stopped up, runny nose.   Had 2 recent negative covid tests.  Taking Nyquil. Having some sore throat.  Throat feels on fire, ears hurt.  Coughing so much throat hurts.  No sick contacts.  Takes allergy pill every morning.  Using no nasal saline.  No other aggravating or relieving factors. No other complaint.    Past Medical History:  Diagnosis Date   Arthritis    Atypical chest pain 07/06/2015   Non-obstructive CAD on [facility] 04/2010.   Chondromalacia of left knee 01/5783   Complication of anesthesia    Family history of cancer    multiple family members, multiple types.   Sees Dr. Marin Olp   Family history of cancer    Family history of premature CAD    GERD  (gastroesophageal reflux disease)    H/O cardiovascular stress test 07/14/2015   myocardial perfusion scan, normal, EF 45-54% mildy reduced LV function, Dr. Oval Linsey   H/O echocardiogram 07/18/2015   LV mild hypertrophy, 55-60%EF, Dr. Oval Linsey   High cholesterol    Hypertension    Obesity    Overweight    PONV (postoperative nausea and vomiting)    Testicular cancer ([place]) 02/2005   Dr. Marin Olp    Current Outpatient Medications on File Prior to Visit  Medication Sig Dispense Refill   acetaminophen (TYLENOL) 500 MG tablet Take 500 mg by mouth every 6 (six) hours as needed.     aspirin (ASPIRIN LOW DOSE) 81 MG EC tablet TAKE 1 TABLET(81 MG) BY MOUTH DAILY. SWALLOW WHOLE 90 tablet 0   cholecalciferol (VITAMIN D3) 25 MCG (1000 UNIT) tablet Take 1 tablet (1,000 Units total) by mouth daily. 90 tablet 3   famotidine (PEPCID) 20 MG tablet TAKE 1 TABLET(20 MG) BY MOUTH TWICE DAILY 180 tablet 0   ibuprofen (ADVIL) 400 MG tablet Take 400 mg by mouth every 6 (six) hours as needed.     lisinopril-hydrochlorothiazide (ZESTORETIC) 20-12.5 MG tablet Take 1 tablet by mouth daily. 90 tablet 3   oxybutynin (DITROPAN) 5 MG tablet Take 1 tablet (5 mg total) by mouth 2 (two) times daily. TAKE 1 TABLET(5 MG) BY MOUTH DAILY 180 tablet 3   rosuvastatin (CRESTOR) 10 MG tablet TAKE 1 TABLET(10 MG)  BY MOUTH DAILY 90 tablet 3   No current facility-administered medications on file prior to visit.     Review of Systems As in subjective    Objective:   Physical Exam Due to coronavirus pandemic stay at home measures, patient visit was virtual and they were not examined in person.   Wt 224 lb (101.6 kg)   BMI 33.08 kg/m   Gen: wd, wn, nad Stuffy sounding No labored breathing or wheezing      Assessment:     Encounter Diagnoses  Name Primary?   Sore throat Yes   Cough    Sinus pressure    Otalgia of both ears        Plan:     Symptoms x3 weeks, 2 negative COVID test Begin medication as below,  rest, hydrate well, can add nasal saline flush Discussed usual timeframe for symptoms to resolve If not much improved within the next 3 to 5 days then call back or reach  Scott Moss was seen today for coughing and sore throat.  Diagnoses and all orders for this visit:  Sore throat  Cough  Sinus pressure  Otalgia of both ears  Other orders -     chlorpheniramine-HYDROcodone (TUSSIONEX PENNKINETIC ER) 10-8 MG/5ML SUER; Take 5 mLs by mouth every 12 (twelve) hours as needed for cough. -     amoxicillin (AMOXIL) 875 MG tablet; Take 1 tablet (875 mg total) by mouth 2 (two) times daily for 10 days.  F/u prn

## 2021-05-04 ENCOUNTER — Ambulatory Visit: Payer: BC Managed Care – PPO | Admitting: Podiatrist

## 2021-05-04 ENCOUNTER — Ambulatory Visit (INDEPENDENT_AMBULATORY_CARE_PROVIDER_SITE_OTHER): Payer: BC Managed Care – PPO

## 2021-05-04 ENCOUNTER — Other Ambulatory Visit: Payer: Self-pay

## 2021-05-04 ENCOUNTER — Encounter: Payer: Self-pay | Admitting: Podiatrist

## 2021-05-04 DIAGNOSIS — G5752 Tarsal tunnel syndrome, left lower limb: Secondary | ICD-10-CM

## 2021-05-04 DIAGNOSIS — M722 Plantar fascial fibromatosis: Secondary | ICD-10-CM

## 2021-05-04 MED ORDER — TRIAMCINOLONE ACETONIDE 10 MG/ML IJ SUSP
10.0000 mg | Freq: Once | INTRAMUSCULAR | Status: AC
Start: 2021-05-04 — End: 2021-05-04
  Administered 2021-05-04: 10 mg

## 2021-05-04 MED ORDER — PREGABALIN 50 MG PO CAPS: 50.0000 mg | ORAL_CAPSULE | Freq: Three times a day (TID) | ORAL | 0 refills | Status: DC

## 2021-05-04 NOTE — Progress Notes (Signed)
  Chief Complaint  Patient presents with   Foot Pain    Bilateral heel pain x 3 months. Burning sensation in toes arch. Stabbing pain @ left heel associated with tingling and toes 1-5. Pt also states that the pain radiates to left achilles.      HPI: Patient is 45 y.o. male who presents today for with his wife for bilateral heel pain for about 3 months duration.  He is has a stabbing pain in the left heel associated with tingling around the toes 2 3 and 4 of the left foot.  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  Relates the pain is been getting worse with time.  Review of Systems No fevers, chills, nausea, muscle aches, no difficulty breathing, no calf pain, no chest pain or shortness of breath.   Physical Exam  GENERAL APPEARANCE: Alert, conversant. Appropriately groomed. No acute distress.   VASCULAR: Pedal pulses palpable DP and PT bilateral.  Capillary refill time is immediate to all digits,  Proximal to distal cooling it warm to warm.  Digital perfusion adequate.   NEUROLOGIC: sensation is intact to 5.07 monofilament at 5/5 sites bilateral.  Light touch is intact bilateral, vibratory sensation intact bilateral.  Discomfort with tapping along the tarsal canal left foot is noted.  He relates tingling in and around the area of the tarsal canal itself.  No proximal or distal radiation of pain.  Subjective complaints of numbness and tingling around toes 2 3 and 4 of the left foot is also noted.  MUSCULOSKELETAL: acceptable muscle strength, tone and stability bilateral.  No gross boney pedal deformities noted.  No pain, crepitus or limitation noted with foot and ankle range of motion bilateral.  Pain with palpation plantar medial aspect of left greater than right heel at the plantar fascial insertion onto the medial calcaneal tubercle is also noted.  DERMATOLOGIC: skin is warm, supple, and dry.  No open lesions noted.  No rash, no pre ulcerative lesions. Digital nails are  asymptomatic.    X-ray evaluation 3 views of the left foot are obtained.  infracalcaneal spur is seen.  No cystic changes within the calcaneus noted. No acute osseous abnormalities are noted no joint dislocation is seen.  Assessment     ICD-10-CM   1. Plantar fasciitis of left foot  M72.2 DG Foot Complete Left    2. Tarsal tunnel syndrome, left  G57.52        Plan  Exam and x-ray findings discussed with patient and his wife.  I recommended a steroid injection into the left heel.  The patient agreed to prepped the skin with alcohol and infiltrated 10 mg of Kenalog with Marcaine mixture into the left heel.  I also recommended trying Lyrica to see if this would help with the tingling pain.  I am starting him out at 50 mg at night and he can slowly increase the dose to 3 times a day as tolerated.    He will be seen back in 3 to 4 weeks for a recheck of this foot if any pedal concerns arise prior to that visit he is instructed to call.

## 2021-05-20 ENCOUNTER — Other Ambulatory Visit: Payer: Self-pay | Admitting: Medical

## 2021-05-20 DIAGNOSIS — R112 Nausea with vomiting, unspecified: Secondary | ICD-10-CM

## 2021-05-21 NOTE — Telephone Encounter (Signed)
Has an appt in december 

## 2021-06-08 ENCOUNTER — Ambulatory Visit: Payer: BC Managed Care – PPO | Admitting: Podiatry

## 2021-06-08 ENCOUNTER — Telehealth: Payer: BC Managed Care – PPO | Admitting: Medical

## 2021-06-08 ENCOUNTER — Other Ambulatory Visit: Payer: Self-pay

## 2021-06-08 ENCOUNTER — Other Ambulatory Visit: Payer: Self-pay | Admitting: Medical

## 2021-06-08 ENCOUNTER — Encounter: Payer: Self-pay | Admitting: Medical

## 2021-06-08 VITALS — Temp 97.5°F | Wt 222.0 lb

## 2021-06-08 DIAGNOSIS — R0602 Shortness of breath: Secondary | ICD-10-CM

## 2021-06-08 DIAGNOSIS — R0609 Other forms of dyspnea: Secondary | ICD-10-CM | POA: Insufficient documentation

## 2021-06-08 DIAGNOSIS — Z20828 Contact with and (suspected) exposure to other viral communicable diseases: Secondary | ICD-10-CM | POA: Diagnosis not present

## 2021-06-08 DIAGNOSIS — R059 Cough, unspecified: Secondary | ICD-10-CM | POA: Diagnosis not present

## 2021-06-08 DIAGNOSIS — J988 Other specified respiratory disorders: Secondary | ICD-10-CM

## 2021-06-08 MED ORDER — HYDROCOD POLST-CPM POLST ER 10-8 MG/5ML PO SUER: 5.0000 mL | Freq: Two times a day (BID) | ORAL | 0 refills | Status: DC | PRN

## 2021-06-08 MED ORDER — AZITHROMYCIN 250 MG PO TABS
ORAL_TABLET | ORAL | 0 refills | Status: DC
Start: 2021-06-08 — End: 2021-08-14

## 2021-06-08 MED ORDER — ALBUTEROL SULFATE HFA 108 (90 BASE) MCG/ACT IN AERS: 2.0000 | INHALATION_SPRAY | Freq: Four times a day (QID) | RESPIRATORY_TRACT | 0 refills | Status: DC | PRN

## 2021-06-08 NOTE — Progress Notes (Signed)
Subjective:     Patient ID: Scott Moss, male   DOB: 12/03/1975, 45 y.o.   MRN: [id number]  This visit type was conducted due to national recommendations for restrictions regarding the COVID-19 Pandemic (e.g. social distancing) in an effort to limit this patient's exposure and mitigate transmission in our community.  Due to their co-morbid illnesses, this patient is at least at moderate risk for complications without adequate follow up.  This format is felt to be most appropriate for this patient at this time.    Documentation for virtual audio and video telecommunications through [place] encounter:  The patient was located at home. The provider was located in the office. The patient did consent to this visit and is aware of possible charges through their insurance for this visit.  The other persons participating in this telemedicine service were none. Time spent on call was 20 minutes and in review of previous records 20 minutes total.  This virtual service is not related to other E/M service within previous 7 days.   HPI Chief Complaint  Patient presents with   Cough    Deep cough, 2 weeks,body aches, chest pain with SOB. Tired- started 2 weeks ago. When laying down his pillow will be soaked wet, feels terrible   Virtual consult for illness.  He notes 2+ weeks of feeling pretty bad.  He notes cough, body aches, fatigue, sweats, chest hurting from all the coughing, runny nose.  He was exposed to RSV 2 weeks ago from his grandchild.  Using some NyQuil over-the-counter.  No fever, no wheezing, no vomiting, no diarrhea, no ear pain or sore throat.  No other exposure.  Non-smoker.  Past Medical History:  Diagnosis Date   Arthritis    Atypical chest pain 07/06/2015   Non-obstructive CAD on [facility] 04/2010.   Chondromalacia of left knee 01/2262   Complication of anesthesia    Family history of cancer    multiple family members, multiple types.   Sees Dr. Marin Moss   Family history of  cancer    Family history of premature CAD    GERD (gastroesophageal reflux disease)    H/O cardiovascular stress test 07/14/2015   myocardial perfusion scan, normal, EF 45-54% mildy reduced LV function, Dr. Oval Moss   H/O echocardiogram 07/18/2015   LV mild hypertrophy, 55-60%EF, Dr. Oval Moss   High cholesterol    Hypertension    Obesity    Overweight    PONV (postoperative nausea and vomiting)    Testicular cancer ([place]) 02/2005   Dr. Marin Moss   Current Outpatient Medications on File Prior to Visit  Medication Sig Dispense Refill   acetaminophen (TYLENOL) 500 MG tablet Take 500 mg by mouth every 6 (six) hours as needed.     aspirin (ASPIRIN LOW DOSE) 81 MG EC tablet TAKE 1 TABLET(81 MG) BY MOUTH DAILY. SWALLOW WHOLE 90 tablet 0   cholecalciferol (VITAMIN D3) 25 MCG (1000 UNIT) tablet Take 1 tablet (1,000 Units total) by mouth daily. 90 tablet 3   famotidine (PEPCID) 20 MG tablet TAKE 1 TABLET(20 MG) BY MOUTH TWICE DAILY 180 tablet 0   ibuprofen (ADVIL) 400 MG tablet Take 400 mg by mouth every 6 (six) hours as needed.     lisinopril-hydrochlorothiazide (ZESTORETIC) 20-12.5 MG tablet TAKE 1 TABLET BY MOUTH DAILY 90 tablet 0   oxybutynin (DITROPAN) 5 MG tablet Take 1 tablet (5 mg total) by mouth 2 (two) times daily. TAKE 1 TABLET(5 MG) BY MOUTH DAILY 180 tablet 3   pregabalin (LYRICA)  50 MG capsule Take 1 capsule (50 mg total) by mouth 3 (three) times daily. Start with 1 capsule at night before bed.  May increase to three times a day if needed. 30 capsule 0   rosuvastatin (CRESTOR) 10 MG tablet TAKE 1 TABLET(10 MG) BY MOUTH DAILY 90 tablet 3   No current facility-administered medications on file prior to visit.     ROS as in subjective   Objective: Temp (!) 97.5 F (36.4 C)   Wt 222 lb (100.7 kg)   BMI 32.78 kg/m   General: Ill-appearing, coughing quite a bit, no wheezing or labored breathing Well-developed, well-nourished, no acute distress Answers questions in complete  sentences   Assessment:     Encounter Diagnoses  Name Primary?   Cough, unspecified type Yes   RSV exposure    SOB (shortness of breath)    Respiratory tract infection        Plan:     We discussed limitations of virtual consult Recommended chest x-ray.  He can do this at [facility] as an outpatient Initially had RSV exposure, but given the timeframe now  and worsening symptoms, begin medication as below, rest, hydrate well, caution with sedation on the cough medicine.  He has used an inhaler before with prior illness. You can use Tylenol for aches or pains or fever If not much improved within the next 48 hours consider call back or recheck  There are no diagnoses linked to this encounter.  [name] was seen today for cough.  Diagnoses and all orders for this visit:  Cough, unspecified type -     DG Chest 2 View; Future  RSV exposure -     DG Chest 2 View; Future  SOB (shortness of breath) -     DG Chest 2 View; Future  Respiratory tract infection  Other orders -     chlorpheniramine-HYDROcodone (TUSSIONEX PENNKINETIC ER) 10-8 MG/5ML SUER; Take 5 mLs by mouth every 12 (twelve) hours as needed for cough. -     albuterol (VENTOLIN HFA) 108 (90 Base) MCG/ACT inhaler; Inhale 2 puffs into the lungs every 6 (six) hours as needed for wheezing or shortness of breath. -     azithromycin (ZITHROMAX) 250 MG tablet; 2 tablets day 1, then 1 tablet days 2-4  F/u prn or pending chest xray

## 2021-07-06 ENCOUNTER — Encounter: Payer: BC Managed Care – PPO | Admitting: Medical

## 2021-07-26 DIAGNOSIS — M25562 Pain in left knee: Secondary | ICD-10-CM | POA: Diagnosis not present

## 2021-08-01 DIAGNOSIS — M25551 Pain in right hip: Secondary | ICD-10-CM | POA: Diagnosis not present

## 2021-08-01 DIAGNOSIS — M25552 Pain in left hip: Secondary | ICD-10-CM | POA: Diagnosis not present

## 2021-08-09 ENCOUNTER — Encounter: Payer: Self-pay | Admitting: Family Medicine

## 2021-08-09 ENCOUNTER — Ambulatory Visit: Payer: BC Managed Care – PPO | Admitting: Family Medicine

## 2021-08-09 ENCOUNTER — Other Ambulatory Visit: Payer: Self-pay

## 2021-08-09 VITALS — BP 128/70 | HR 114 | Temp 98.8°F | Wt 231.4 lb

## 2021-08-09 DIAGNOSIS — J209 Acute bronchitis, unspecified: Secondary | ICD-10-CM

## 2021-08-09 MED ORDER — BENZONATATE 100 MG PO CAPS
200.0000 mg | ORAL_CAPSULE | Freq: Three times a day (TID) | ORAL | 0 refills | Status: DC | PRN
Start: 2021-08-09 — End: 2021-08-14

## 2021-08-09 MED ORDER — AMOXICILLIN-POT CLAVULANATE 875-125 MG PO TABS: 1.0000 | ORAL_TABLET | Freq: Two times a day (BID) | ORAL | 0 refills | Status: DC

## 2021-08-09 NOTE — Progress Notes (Signed)
° °  Subjective:    Patient ID: Scott Moss, male    DOB: 1975/10/05, 46 y.o.   MRN: [id number]  HPI He is here for a recheck.  He was treated November 4 with a Z-Pak and a cough medicine.  He stated the Z-Pak First several days but the coughing has continued.  Earlier today he had a coughing paroxysm and did note some blood.  He has not seen any since then and says the cough is productive cough but no fever, chills, sore throat, earache or shortness of breath.   Review of Systems     Objective:   Physical Exam Alert and in no distress. Tympanic membranes are both abnormal with scarring and questionable retraction on the right.  Canals are normal. Pharyngeal area is normal. Neck is supple without adenopathy or thyromegaly. Cardiac exam shows a regular sinus rhythm without murmurs or gallops. Lungs are clear to auscultation.        Assessment & Plan:  Acute bronchitis, unspecified organism - Plan: amoxicillin-clavulanate (AUGMENTIN) 875-125 MG tablet, benzonatate (TESSALON) 100 MG capsule Take all the antibiotic and if you are not totally back to normal when you finish call You can use Robitussin-DM during the day and NyQuil at night with the Tessalon If he continues to have trouble, we will have him come back for reevaluation and possible x-ray.

## 2021-08-09 NOTE — Patient Instructions (Addendum)
Take all the antibiotic and if you are not totally back to normal when you finish call You can use Robitussin-DM during the day and NyQuil at night with the [facility]

## 2021-08-14 ENCOUNTER — Other Ambulatory Visit: Payer: Self-pay | Admitting: Medical

## 2021-08-14 ENCOUNTER — Encounter: Payer: Self-pay | Admitting: Medical

## 2021-08-14 ENCOUNTER — Ambulatory Visit
Admission: RE | Admit: 2021-08-14 | Discharge: 2021-08-14 | Disposition: A | Discharge status: Home / Self Care | Payer: BC Managed Care – PPO | Source: Ambulatory Visit | Attending: Medical | Admitting: Medical

## 2021-08-14 ENCOUNTER — Ambulatory Visit (INDEPENDENT_AMBULATORY_CARE_PROVIDER_SITE_OTHER): Payer: BC Managed Care – PPO | Admitting: Medical

## 2021-08-14 ENCOUNTER — Other Ambulatory Visit: Payer: Self-pay

## 2021-08-14 VITALS — BP 124/88 | HR 91 | Wt 230.8 lb

## 2021-08-14 DIAGNOSIS — J9811 Atelectasis: Secondary | ICD-10-CM | POA: Diagnosis not present

## 2021-08-14 DIAGNOSIS — Z8249 Family history of ischemic heart disease and other diseases of the circulatory system: Secondary | ICD-10-CM | POA: Diagnosis not present

## 2021-08-14 DIAGNOSIS — K760 Fatty (change of) liver, not elsewhere classified: Secondary | ICD-10-CM

## 2021-08-14 DIAGNOSIS — H6593 Unspecified nonsuppurative otitis media, bilateral: Secondary | ICD-10-CM | POA: Insufficient documentation

## 2021-08-14 DIAGNOSIS — Z87438 Personal history of other diseases of male genital organs: Secondary | ICD-10-CM

## 2021-08-14 DIAGNOSIS — Z809 Family history of malignant neoplasm, unspecified: Secondary | ICD-10-CM

## 2021-08-14 DIAGNOSIS — Z0000 Encounter for general adult medical examination without abnormal findings: Secondary | ICD-10-CM

## 2021-08-14 DIAGNOSIS — J309 Allergic rhinitis, unspecified: Secondary | ICD-10-CM

## 2021-08-14 DIAGNOSIS — Z131 Encounter for screening for diabetes mellitus: Secondary | ICD-10-CM | POA: Insufficient documentation

## 2021-08-14 DIAGNOSIS — Z8547 Personal history of malignant neoplasm of testis: Secondary | ICD-10-CM | POA: Diagnosis not present

## 2021-08-14 DIAGNOSIS — Z125 Encounter for screening for malignant neoplasm of prostate: Secondary | ICD-10-CM | POA: Diagnosis not present

## 2021-08-14 DIAGNOSIS — J988 Other specified respiratory disorders: Secondary | ICD-10-CM | POA: Insufficient documentation

## 2021-08-14 DIAGNOSIS — R61 Generalized hyperhidrosis: Secondary | ICD-10-CM

## 2021-08-14 DIAGNOSIS — R7301 Impaired fasting glucose: Secondary | ICD-10-CM | POA: Insufficient documentation

## 2021-08-14 DIAGNOSIS — R112 Nausea with vomiting, unspecified: Secondary | ICD-10-CM

## 2021-08-14 DIAGNOSIS — R059 Cough, unspecified: Secondary | ICD-10-CM

## 2021-08-14 DIAGNOSIS — I10 Essential (primary) hypertension: Secondary | ICD-10-CM

## 2021-08-14 DIAGNOSIS — E785 Hyperlipidemia, unspecified: Secondary | ICD-10-CM

## 2021-08-14 MED ORDER — ROSUVASTATIN CALCIUM 10 MG PO TABS: ORAL_TABLET | ORAL | 3 refills | Status: DC

## 2021-08-14 MED ORDER — ASPIRIN 81 MG PO TBEC: DELAYED_RELEASE_TABLET | ORAL | 3 refills | Status: DC

## 2021-08-14 MED ORDER — FAMOTIDINE 20 MG PO TABS: 20.0000 mg | ORAL_TABLET | Freq: Every day | ORAL | 1 refills | Status: DC

## 2021-08-14 MED ORDER — ALBUTEROL SULFATE HFA 108 (90 BASE) MCG/ACT IN AERS: 2.0000 | INHALATION_SPRAY | Freq: Four times a day (QID) | RESPIRATORY_TRACT | 0 refills | Status: DC | PRN

## 2021-08-14 MED ORDER — OXYBUTYNIN CHLORIDE 5 MG PO TABS: 5.0000 mg | ORAL_TABLET | Freq: Two times a day (BID) | ORAL | 3 refills | Status: DC

## 2021-08-14 MED ORDER — LISINOPRIL-HYDROCHLOROTHIAZIDE 20-12.5 MG PO TABS: 1.0000 | ORAL_TABLET | Freq: Every day | ORAL | 3 refills | Status: DC

## 2021-08-14 MED ORDER — HYDROCOD POLST-CPM POLST ER 10-8 MG/5ML PO SUER: 5.0000 mL | Freq: Two times a day (BID) | ORAL | 0 refills | Status: DC | PRN

## 2021-08-14 NOTE — Progress Notes (Signed)
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 No known family history of DM   Cough    For 2 months- is starting to cause chest discomfort/ pain. Saw Dr. Redmond School last week, continues to cough up blood    Medical team: See dentist Not currently seeing eye doctor Ortho, Dr. Marchia Bond Dr. Skeet Latch, cardiology 2016 D[facility]atology in [place], [place], Camelia Eng, PA-C here for primary care   Concerns: He was seen recently by Dr. Redmond School here for respiratory tract infection.  He is somewhat improved but still has chest pressure, cough, getting some blood-tinged sputum.  No fever no body aches or chills.  In the note he has been sick in the past week he has had an ongoing cough even since Thanksgiving.  He is a non-smoker.  He is still using amoxicillin and Tessalon Perles.  He had a DOT physical back in the summertime 2022.  At that time his blood sugar was elevated.  He denies polyuria, polydipsia, blurred vision.  Hyperlipidemia-compliant with medication without complaint  Hypertension-compliant with medication without complaint  He had hip surgery bilaterally within recent months.  He has had some intermittent left leg swelling in general not just the lower leg.  Orthopedics put him on prednisone and the swelling improved.  Once the prednisone was done the swelling came back.   Reviewed their medical, surgical, family, social, medication, and allergy history and updated chart as appropriate.  Past Medical History:  Diagnosis Date   Arthritis    Atypical chest pain 07/06/2015   Non-obstructive CAD on [facility] 04/2010.   Chondromalacia of left knee 09/7515   Complication of anesthesia    Family history of cancer    multiple family members, multiple types.   Sees Dr. Marin Olp   Family history of cancer    Family history of premature CAD    GERD (gastroesophageal reflux disease)     H/O cardiovascular stress test 07/14/2015   myocardial perfusion scan, normal, EF 45-54% mildy reduced LV function, Dr. Oval Linsey   H/O echocardiogram 07/18/2015   LV mild hypertrophy, 55-60%EF, Dr. Oval Linsey   High cholesterol    Hypertension    Obesity    Overweight    PONV (postoperative nausea and vomiting)    Testicular cancer ([place]) 02/2005   Dr. Marin Olp    Past Surgical History:  Procedure Laterality Date   ANTERIOR CERVICAL DECOMP/DISCECTOMY FUSION  11/11/2008   C5-6   APPENDECTOMY     CARDIAC CATHETERIZATION  04/13/2010   no disease   CERVICAL SPINE SURGERY  08/05/2013   Dr. Deri Fuelling   COLONOSCOPY     prior, no problems per patient, year unknown, possibly in early 3s.   HIP ARTHROPLASTY Left 03/2020   HIP ARTHROPLASTY Right 04/2020   KNEE ARTHROSCOPY Right    KNEE ARTHROSCOPY Left 10/19/2015   Procedure: LEFT KNEE SCOPE CHONDROPLASTY;  Surgeon: [name], MD;  Location: [place];  Service: Orthopedics;  Laterality: Left;   KNEE ARTHROSCOPY WITH LATERAL RELEASE Left 09/18/2012   Procedure: KNEE ARTHROSCOPY WITH LATERAL RELEASE;  Surgeon: [name], MD;  Location: [place];  Service: Orthopedics;  Laterality: Left;  WITH DEBRIDEMENT AND SHAVING(CHONDROPLASTY), Excision Medial [place]   NASAL SINUS SURGERY  08/05/2012   PARTIAL KNEE ARTHROPLASTY Left 08/15/2016   Procedure: PATELLA FEMORAL REPLACEMENT LEFT KNEE;  Surgeon: Nestor Ramp  Percell Miller, MD;  Location: [place];  Service: Orthopedics;  Laterality: Left;   RADICAL ORCHIECTOMY  02/07/2005   left; with left testicular implant    Social History   Socioeconomic History   Marital status: Married    Spouse name: Not on file   Number of children: 2   Years of education: Not on file   Highest education level: Not on file  Occupational History   Occupation: truck driver  Tobacco Use   Smoking status: Never    Passive exposure: Never   Smokeless tobacco:  Never   Tobacco comments:    never used tobacco  Vaping Use   Vaping Use: Never used  Substance and Sexual Activity   Alcohol use: Yes    Alcohol/week: 3.0 standard drinks    Types: 3 Cans of beer per week    Comment: social   Drug use: No   Sexual activity: Yes  Other Topics Concern   Not on file  Social History Narrative   Married, 2 children, ages 78yo Herbalist, 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   Exercise - not much as of 03/2016      Social Determinants of Health   Financial Resource Strain: Not on file  Food Insecurity: Not on file  Transportation Needs: Not on file  Physical Activity: Not on file  Stress: Not on file  Social Connections: Not on file  Intimate Partner Violence: Not on file    Family History  Problem Relation Age of Onset   Ovarian cancer Mother        uterine, mets   Hypertension Mother    Stroke Mother    Migraines Mother    Cancer Mother    Lung cancer Father    Hypertension Father    Heart attack Father 29   Skin cancer Father        ?   Heart disease Father    Cancer Father        lung   Cancer Sister 16       multiple myeloma   Hypertension Sister    Migraines Sister    Diabetes Maternal Uncle    Colon cancer Maternal Uncle    Esophageal cancer Maternal Uncle    Heart attack Maternal Uncle    Heart attack Paternal Uncle    Stomach cancer Neg Hx    Colon polyps Neg Hx    Rectal cancer Neg Hx      Current Outpatient Medications:    amoxicillin-clavulanate (AUGMENTIN) 875-125 MG tablet, Take 1 tablet by mouth 2 (two) times daily., Disp: 20 tablet, Rfl: 0   acetaminophen (TYLENOL) 500 MG tablet, Take 500 mg by mouth every 6 (six) hours as needed., Disp: , Rfl:    albuterol (VENTOLIN HFA) 108 (90 Base) MCG/ACT inhaler, Inhale 2 puffs into the lungs every 6 (six) hours as needed for wheezing or shortness of breath., Disp: 8 g, Rfl: 0   aspirin (ASPIRIN LOW DOSE) 81 MG EC tablet, TAKE 1  TABLET(81 MG) BY MOUTH DAILY. SWALLOW WHOLE, Disp: 90 tablet, Rfl: 3   chlorpheniramine-HYDROcodone (TUSSIONEX PENNKINETIC ER) 10-8 MG/5ML SUER, Take 5 mLs by mouth every 12 (twelve) hours as needed for cough., Disp: 115 mL, Rfl: 0   famotidine (PEPCID) 20 MG tablet, Take 1 tablet (20 mg total) by mouth daily., Disp: 180 tablet, Rfl: 1   lisinopril-hydrochlorothiazide (ZESTORETIC) 20-12.5 MG tablet, Take 1 tablet by mouth daily., Disp: 90  tablet, Rfl: 3   oxybutynin (DITROPAN) 5 MG tablet, Take 1 tablet (5 mg total) by mouth 2 (two) times daily. TAKE 1 TABLET(5 MG) BY MOUTH DAILY, Disp: 180 tablet, Rfl: 3   rosuvastatin (CRESTOR) 10 MG tablet, TAKE 1 TABLET(10 MG) BY MOUTH DAILY, Disp: 90 tablet, Rfl: 3  Allergies  Allergen Reactions   Benicar [Olmesartan]     Rash, allergic reaction   Nexium [Esomeprazole Magnesium]     Rash, allergic reaction     Review of Systems Constitutional: -fever, -chills, -sweats, -unexpected weight change, -decreased appetite, -fatigue Allergy: -sneezing, -itching, -congestion Dermatology: -changing moles, --rash, -lumps ENT: -runny nose, -ear pain, -sore throat, -hoarseness, -sinus pain, -teeth pain, - ringing in ears, -hearing loss, -nosebleeds Cardiology: -chest pain, -palpitations, -swelling, -difficulty breathing when lying flat, -waking up short of breath Respiratory: -cough, -shortness of breath, -difficulty breathing with exercise or exertion, -wheezing, -coughing up blood Gastroenterology: -abdominal pain, -nausea, -vomiting, -diarrhea, -constipation, -blood in stool, -changes in bowel movement, -difficulty swallowing or eating Hematology: -bleeding, -bruising  Musculoskeletal: -joint aches, -muscle aches, -joint swelling, -back pain, -neck pain, -cramping, -changes in gait Ophthalmology: denies vision changes, eye redness, itching, discharge Urology: -burning with urination, -difficulty urinating, -blood in urine, -urinary frequency, -urgency,  -incontinence Neurology: -headache, -weakness, -tingling, -numbness, -memory loss, -falls, -dizziness Psychology: -depressed mood, -agitation, -sleep problems Male GU: no testicular mass, pain, no lymph nodes swollen, no swelling, no rash.     Objective:  BP 124/88 (BP Location: Right Arm)    Pulse 91    Wt 230 lb 12.8 oz (104.7 kg)    SpO2 97%    BMI 34.08 kg/m   BP Readings from Last 3 Encounters:  08/14/21 124/88  08/09/21 128/70  03/09/21 108/60   Wt Readings from Last 3 Encounters:  08/14/21 230 lb 12.8 oz (104.7 kg)  08/09/21 231 lb 6.4 oz (105 kg)  06/08/21 222 lb (100.7 kg)     General appearance: alert, no distress, WD/WN, Caucasian male Skin: Tattoos bilateral arms, sleeve type tattoo, no worrisome lesions otherwise HEENT: Normocephalic, PERRLA, EOMI, conjunctive are normal, bilateral TMs are retracted and have mucoid coloration behind the TMs Oral: MMM, no lesions, no erythema or tonsillar swelling Neck: supple, no lymphadenopathy, no thyromegaly, no masses, normal ROM, no bruits Chest: non tender, normal shape and expansion Heart: RRR, normal S1, S2, no murmurs Lungs: Scattered rhonchi, somewhat decreased breath sounds in general, no wheezes, no rales Abdomen: +bs, soft, non tender, non distended, no masses, no hepatomegaly, no splenomegaly, no bruits Back: non tender, normal ROM, no scoliosis Musculoskeletal: upper extremities non tender, no obvious deformity, lower extremities non tender, no obvious deformity Extremities: no edema, no cyanosis, no clubbing Pulses: 2+ symmetric, upper and lower extremities, normal cap refill Neurological: alert, oriented x 3, CN2-12 intact, strength normal upper extremities and lower extremities, sensation normal throughout, DTRs 2+ throughout, no cerebellar signs, gait normal Psychiatric: normal affect, behavior normal, pleasant  GU: normal male external genitalia,circumcised, left testicular prosthetic s/p orchectomy, nontender, no  masses, no hernia, no lymphadenopathy Rectal: deferred    Assessment and Plan :   Encounter Diagnoses  Name Primary?   Encounter for health maintenance examination in adult Yes   Family history of cancer    Family history of premature CAD    Fatty liver    H/O testicular cancer    History of prostatitis    Hyperlipidemia, unspecified hyperlipidemia type    Hyperhidrosis    Primary hypertension    Allergic rhinitis,  unspecified seasonality, unspecified trigger    Cough, unspecified type    Screening for prostate cancer    Screening for diabetes mellitus    Nausea and vomiting    Respiratory tract infection    Mucoid otitis media of both ears, unspecified chronicity     Physical exam - discussed and counseled on healthy lifestyle, diet, exercise, preventative care, vaccinations, sick and well care, proper use of emergency dept and after hours care, and addressed their concerns.    Health screening: See your eye doctor yearly for routine vision care. See your dentist yearly for routine dental care including hygiene visits twice yearly.  Vaccines: Immunization History  Administered Date(s) Administered   Janssen (J&J) SARS-COV-2 Vaccination 01/19/2020   PFIZER(Purple Top)SARS-COV-2 Vaccination 02/03/2021   Tdap 04/03/2016    Declines both flu and covid vaccines  Once he is over his current illness I advise he come in in the next month or so and do a pneumococcal 23 vaccine.  He has had several respiratory tract infections in the past year and would likely benefit from pneumococcal vaccine   Cancer screens: I reviewed his August 2022 colonoscopy report  Prostate screening today given history of other cancer and given age  We discussed skin surveillance.    Separate significant issues discussed: Cough, recent respiratory tract infection, otitis media-finished amoxicillin, refilled cough syrup since Tessalon is not helping as much.  Go for chest x-ray.  Excess  sweating/Hyperhidrosis -continue your Oxybutynin   High blood pressure and high cholesterol continue your medicaiton as usual  History of fatty liver disease Eat a low fat diet Exercise regularly limit alcohol Work on losing weight  Impaired fasting glucose, screen for diabetes-labs today  Alby was seen today for annual exam and cough.  Diagnoses and all orders for this visit:  Encounter for health maintenance examination in adult -     Comprehensive metabolic panel -     CBC with Differential/Platelet -     Lipid panel -     PSA -     Urinalysis -     Hemoglobin A1c -     DG Chest 2 View; Future -     VITAMIN D 25 Hydroxy (Vit-D Deficiency, Fractures)  Family history of cancer  Family history of premature CAD  Fatty liver -     Comprehensive metabolic panel  H/O testicular cancer -     PSA  History of prostatitis  Hyperlipidemia, unspecified hyperlipidemia type -     Lipid panel  Hyperhidrosis  Primary hypertension  Allergic rhinitis, unspecified seasonality, unspecified trigger  Cough, unspecified type -     DG Chest 2 View; Future  Screening for prostate cancer -     PSA  Screening for diabetes mellitus -     Hemoglobin A1c  Nausea and vomiting Comments: continue zofran prn; discussed hydration, liquid diet and advance as tolerated Orders: -     famotidine (PEPCID) 20 MG tablet; Take 1 tablet (20 mg total) by mouth daily.  Respiratory tract infection  Mucoid otitis media of both ears, unspecified chronicity  Other orders -     oxybutynin (DITROPAN) 5 MG tablet; Take 1 tablet (5 mg total) by mouth 2 (two) times daily. TAKE 1 TABLET(5 MG) BY MOUTH DAILY -     lisinopril-hydrochlorothiazide (ZESTORETIC) 20-12.5 MG tablet; Take 1 tablet by mouth daily. -     aspirin (ASPIRIN LOW DOSE) 81 MG EC tablet; TAKE 1 TABLET(81 MG) BY MOUTH DAILY. SWALLOW WHOLE -  albuterol (VENTOLIN HFA) 108 (90 Base) MCG/ACT inhaler; Inhale 2 puffs into the lungs  every 6 (six) hours as needed for wheezing or shortness of breath. -     chlorpheniramine-HYDROcodone (TUSSIONEX PENNKINETIC ER) 10-8 MG/5ML SUER; Take 5 mLs by mouth every 12 (twelve) hours as needed for cough. -     rosuvastatin (CRESTOR) 10 MG tablet; TAKE 1 TABLET(10 MG) BY MOUTH DAILY   Follow-up pending labs, yearly for physical

## 2021-08-15 ENCOUNTER — Other Ambulatory Visit: Payer: Self-pay | Admitting: Medical

## 2021-08-15 ENCOUNTER — Other Ambulatory Visit: Payer: Self-pay | Admitting: Internal Medicine

## 2021-08-15 DIAGNOSIS — R059 Cough, unspecified: Secondary | ICD-10-CM

## 2021-08-15 DIAGNOSIS — R9389 Abnormal findings on diagnostic imaging of other specified body structures: Secondary | ICD-10-CM

## 2021-08-15 LAB — COMPREHENSIVE METABOLIC PANEL
ALT: 31 IU/L (ref 0–44)
AST: 26 IU/L (ref 0–40)
Albumin/Globulin Ratio: 1.6 (ref 1.2–2.2)
Albumin: 4.7 g/dL (ref 4.0–5.0)
Alkaline Phosphatase: 107 IU/L (ref 44–121)
BUN/Creatinine Ratio: 14 (ref 9–20)
BUN: 14 mg/dL (ref 6–24)
Bilirubin Total: 0.4 mg/dL (ref 0.0–1.2)
CO2: 20 mmol/L (ref 20–29)
Calcium: 9.9 mg/dL (ref 8.7–10.2)
Chloride: 102 mmol/L (ref 96–106)
Creatinine, Ser: 1.02 mg/dL (ref 0.76–1.27)
Globulin, Total: 2.9 g/dL (ref 1.5–4.5)
Glucose: 84 mg/dL (ref 70–99)
Potassium: 4.7 mmol/L (ref 3.5–5.2)
Sodium: 139 mmol/L (ref 134–144)
Total Protein: 7.6 g/dL (ref 6.0–8.5)
eGFR: 91 mL/min/{1.73_m2} (ref >59)

## 2021-08-15 LAB — URINALYSIS
Bilirubin, UA: NEGATIVE
Glucose, UA: NEGATIVE
Ketones, UA: NEGATIVE
Leukocytes,UA: NEGATIVE
Nitrite, UA: NEGATIVE
Protein,UA: NEGATIVE
RBC, UA: NEGATIVE
Specific Gravity, UA: 1.019 (ref 1.005–1.030)
Urobilinogen, Ur: 0.2 mg/dL (ref 0.2–1.0)
pH, UA: 6 (ref 5.0–7.5)

## 2021-08-15 LAB — CBC WITH DIFFERENTIAL/PLATELET
Basophils Absolute: 0.1 10*3/uL (ref 0.0–0.2)
Basos: 1 %
EOS (ABSOLUTE): 0.1 10*3/uL (ref 0.0–0.4)
Eos: 2 %
Hematocrit: 46.3 % (ref 37.5–51.0)
Hemoglobin: 16.4 g/dL (ref 13.0–17.7)
Immature Grans (Abs): 0 10*3/uL (ref 0.0–0.1)
Immature Granulocytes: 1 %
Lymphocytes Absolute: 1.9 10*3/uL (ref 0.7–3.1)
Lymphs: 23 %
MCH: 31.2 pg (ref 26.6–33.0)
MCHC: 35.4 g/dL (ref 31.5–35.7)
MCV: 88 fL (ref 79–97)
Monocytes Absolute: 0.7 10*3/uL (ref 0.1–0.9)
Monocytes: 9 %
Neutrophils Absolute: 5.4 10*3/uL (ref 1.4–7.0)
Neutrophils: 64 %
Platelets: 367 10*3/uL (ref 150–450)
RBC: 5.26 x10E6/uL (ref 4.14–5.80)
RDW: 13.2 % (ref 11.6–15.4)
WBC: 8.2 10*3/uL (ref 3.4–10.8)

## 2021-08-15 LAB — LIPID PANEL
Chol/HDL Ratio: 3.1 ratio (ref 0.0–5.0)
Cholesterol, Total: 147 mg/dL (ref 100–199)
HDL: 47 mg/dL (ref >39)
LDL Chol Calc (NIH): 84 mg/dL (ref 0–99)
Triglycerides: 83 mg/dL (ref 0–149)
VLDL Cholesterol Cal: 16 mg/dL (ref 5–40)

## 2021-08-15 LAB — PSA: Prostate Specific Ag, Serum: 1.1 ng/mL (ref 0.0–4.0)

## 2021-08-15 LAB — HEMOGLOBIN A1C
Est. average glucose Bld gHb Est-mCnc: 120 mg/dL
Hgb A1c MFr Bld: 5.8 % — ABNORMAL HIGH (ref 4.8–5.6)

## 2021-08-15 LAB — VITAMIN D 25 HYDROXY (VIT D DEFICIENCY, FRACTURES): Vit D, 25-Hydroxy: 33 ng/mL (ref 30.0–100.0)

## 2021-08-15 MED ORDER — OXYBUTYNIN CHLORIDE 5 MG PO TABS: 5.0000 mg | ORAL_TABLET | Freq: Two times a day (BID) | ORAL | 3 refills | Status: DC

## 2021-08-18 ENCOUNTER — Other Ambulatory Visit: Payer: Self-pay | Admitting: Medical

## 2021-08-18 DIAGNOSIS — R112 Nausea with vomiting, unspecified: Secondary | ICD-10-CM

## 2021-09-05 ENCOUNTER — Other Ambulatory Visit: Payer: Self-pay

## 2021-09-05 ENCOUNTER — Other Ambulatory Visit (HOSPITAL_COMMUNITY): Payer: Self-pay | Admitting: Orthopedic Surgery

## 2021-09-05 ENCOUNTER — Ambulatory Visit (HOSPITAL_COMMUNITY)
Admission: RE | Admit: 2021-09-05 | Discharge: 2021-09-05 | Disposition: A | Discharge status: Home / Self Care | Payer: BC Managed Care – PPO | Source: Ambulatory Visit | Attending: Orthopedic Surgery | Admitting: Orthopedic Surgery

## 2021-09-05 DIAGNOSIS — M79605 Pain in left leg: Secondary | ICD-10-CM | POA: Diagnosis not present

## 2021-09-05 DIAGNOSIS — M7989 Other specified soft tissue disorders: Secondary | ICD-10-CM

## 2021-09-05 DIAGNOSIS — M25562 Pain in left knee: Secondary | ICD-10-CM | POA: Diagnosis not present

## 2021-09-05 DIAGNOSIS — T8484XA Pain due to internal orthopedic prosthetic devices, implants and grafts, initial encounter: Secondary | ICD-10-CM | POA: Diagnosis not present

## 2021-09-05 NOTE — Progress Notes (Signed)
Left lower extremity venous duplex has been completed. Preliminary results can be found in CV Proc through chart review.  Results were given to Merlene Pulling PA.  09/05/21 10:16 AM Scott Moss RVT

## 2021-09-28 ENCOUNTER — Ambulatory Visit: Payer: BC Managed Care – PPO | Admitting: Internal Medicine

## 2021-09-28 ENCOUNTER — Encounter: Payer: Self-pay | Admitting: Internal Medicine

## 2021-09-28 ENCOUNTER — Other Ambulatory Visit: Payer: Self-pay

## 2021-09-28 DIAGNOSIS — R059 Cough, unspecified: Secondary | ICD-10-CM | POA: Diagnosis not present

## 2021-09-28 DIAGNOSIS — I10 Essential (primary) hypertension: Secondary | ICD-10-CM

## 2021-09-28 DIAGNOSIS — R0609 Other forms of dyspnea: Secondary | ICD-10-CM | POA: Diagnosis not present

## 2021-09-28 MED ORDER — VALSARTAN-HYDROCHLOROTHIAZIDE 160-12.5 MG PO TABS: 1.0000 | ORAL_TABLET | Freq: Every day | ORAL | 11 refills | Status: DC

## 2021-09-28 NOTE — Progress Notes (Signed)
Scott Moss, male    DOB: [birth year],   MRN: [id number]   Brief patient profile:  67   yowm never smoker s/p orchiectomy on L and chemo Ennever 2006 and released  referred to pulmonary clinic in [facility]  09/28/2021 by Scott Moss  for refractory cough s/p covid  08/24/20 with doe then cough and sore throat since 04/2021.    History of Present Illness  09/28/2021  Pulmonary/ 1st office eval/ Scott Moss /  Office / on ACEi  Chief Complaint  Patient presents with   Consult    Cough and abnormal CXR.  Cough- several months   Dyspnea:  mb and back uphill around 50 ft and stops on arrival  Cough: mostly dry but intensity severe and peaks at hs s pnds / quiets down overnight then back with activity in am  Sleep: bed is flat, 4-5 pillows  SABA use: not using, did not help  No obvious day to day or daytime variability or assoc excess/ purulent sputum or mucus plugs or hemoptysis or cp or chest tightness, subjective wheeze or overt sinus or hb symptoms.   Sleeping most nights without nocturnal  or early am exacerbation  of respiratory  c/o's or need for noct saba. Also denies any obvious fluctuation of symptoms with weather or environmental changes or other aggravating or alleviating factors except as outlined above   No unusual exposure hx or h/o childhood pna/ asthma or knowledge of premature birth.  Current Allergies, Complete Past Medical History, Past Surgical History, Family History, and Social History were reviewed in [employer] record.  ROS  The following are not active complaints unless bolded Hoarseness, sore throat, dysphagia, dental problems, itching, sneezing,  nasal congestion or discharge of excess mucus or purulent secretions, ear ache,   fever, chills, sweats, unintended wt loss or wt gain, classically pleuritic or exertional cp,  orthopnea pnd or arm/hand swelling  or leg swelling, presyncope, palpitations, abdominal pain, anorexia, nausea,  vomiting, diarrhea  or change in bowel habits or change in bladder habits, change in stools or change in urine, dysuria, hematuria,  rash, arthralgias, visual complaints, headache, numbness, weakness or ataxia or problems with walking or coordination,  change in mood or  memory.             Past Medical History:  Diagnosis Date   Arthritis    Atypical chest pain 07/06/2015   Non-obstructive CAD on [facility] 04/2010.   Chondromalacia of left knee 08/7614   Complication of anesthesia    Family history of cancer    multiple family members, multiple types.   Sees Dr. Marin Olp   Family history of cancer    Family history of premature CAD    GERD (gastroesophageal reflux disease)    H/O cardiovascular stress test 07/14/2015   myocardial perfusion scan, normal, EF 45-54% mildy reduced LV function, Dr. Oval Linsey   H/O echocardiogram 07/18/2015   LV mild hypertrophy, 55-60%EF, Dr. Oval Linsey   High cholesterol    Hypertension    Obesity    Overweight    PONV (postoperative nausea and vomiting)    Testicular cancer ([place]) 02/2005   Dr. Marin Olp    Outpatient Medications Prior to Visit  Medication Sig Dispense Refill   acetaminophen (TYLENOL) 500 MG tablet Take 500 mg by mouth every 6 (six) hours as needed.     albuterol (VENTOLIN HFA) 108 (90 Base) MCG/ACT inhaler Inhale 2 puffs into the lungs every 6 (six) hours as needed for wheezing  or shortness of breath. 8 g 0   amoxicillin-clavulanate (AUGMENTIN) 875-125 MG tablet Take 1 tablet by mouth 2 (two) times daily. 20 tablet 0   aspirin (ASPIRIN LOW DOSE) 81 MG EC tablet TAKE 1 TABLET(81 MG) BY MOUTH DAILY. SWALLOW WHOLE 90 tablet 3   chlorpheniramine-HYDROcodone (TUSSIONEX PENNKINETIC ER) 10-8 MG/5ML SUER Take 5 mLs by mouth every 12 (twelve) hours as needed for cough. 115 mL 0   famotidine (PEPCID) 20 MG tablet Take 1 tablet (20 mg total) by mouth daily. 180 tablet 1   lisinopril-hydrochlorothiazide (ZESTORETIC) 20-12.5 MG tablet Take 1 tablet by mouth  daily. 90 tablet 3   oxybutynin (DITROPAN) 5 MG tablet Take 1 tablet (5 mg total) by mouth 2 (two) times daily. 180 tablet 3   rosuvastatin (CRESTOR) 10 MG tablet TAKE 1 TABLET(10 MG) BY MOUTH DAILY 90 tablet 3   No facility-administered medications prior to visit.     Objective:     BP 132/80 (BP Location: Left Arm, Patient Position: Sitting)    Pulse 90    Temp 98.7 F (37.1 C) (Temporal)    Ht 5\' 11"  (1.803 m)    Wt 225 lb 12.8 oz (102.4 kg)    SpO2 98% Comment: ra   BMI 31.49 kg/m   SpO2: 98 % (ra)   muscular/ fit appearing  pleasant wm nad   HEENT : pt wearing mask not removed for exam due to covid -19 concerns.    NECK :  without JVD/Nodes/TM/ nl carotid upstrokes bilaterally   LUNGS: no acc muscle use,  Nl contour chest which is clear to A and P bilaterally without cough on insp or exp maneuvers   CV:  RRR  no s3 or murmur or increase in P2, and no edema   ABD:  soft and nontender with nl inspiratory excursion in the supine position. No bruits or organomegaly appreciated, bowel sounds nl  MS:  Nl gait/ ext warm without deformities, calf tenderness, cyanosis or clubbing. No obvious joint restrictions   SKIN: warm and dry without lesions    NEURO:  alert, approp, nl sensorium with  no motor or cerebellar deficits apparent.    I personally reviewed images and agree with radiology impression as follows:  CXR:   pa and lateral  08/14/21 Low lung volumes, without plain film evidence of acute cardiopulmonary disease     Assessment   Cough Onset 04/2021 assoc with ST on ACEi > d/c 09/28/2021   The most common causes of chronic cough in immunocompetent adults include the following: upper airway cough syndrome (UACS), previously referred to as postnasal drip syndrome (PNDS), which is caused by variety of rhinosinus conditions; (2) asthma; (3) GERD; (4) chronic bronchitis from cigarette smoking or other inhaled environmental irritants; (5) nonasthmatic eosinophilic  bronchitis; and (6) bronchiectasis.   These conditions, singly or in combination, have accounted for up to 94% of the causes of chronic cough in prospective studies.   Other conditions have constituted no >6% of the causes in prospective studies These have included bronchogenic carcinoma, chronic interstitial pneumonia, sarcoidosis, left ventricular failure, ACEI-induced cough, and aspiration from a condition associated with pharyngeal dysfunction.    Chronic cough is often simultaneously caused by more than one condition. A single cause has been found from 38 to 82% of the time, multiple causes from 18 to 62%. Multiply caused cough has been the result of three diseases up to 42% of the time.    I strongly suspect ACEi in pt with  tendency to Upper airway cough syndrome (previously labeled PNDS),  is so named because it's frequently impossible to sort out how much is  CR/sinusitis with freq throat clearing (which can be related to primary GERD)   vs  causing  secondary (" extra esophageal")  GERD from wide swings in gastric pressure that occur with throat clearing, often  promoting self use of mint and menthol lozenges that reduce the lower esophageal sphincter tone and exacerbate the problem further in a cyclical fashion.   These are the same pts (now being labeled as having "irritable larynx syndrome" by some cough centers) who not infrequently have a history of having failed to tolerate ace inhibitors,  dry powder inhalers or biphosphonates or report having atypical/extraesophageal reflux symptoms that don't respond to standard doses of PPI  and are easily confused as having aecopd or asthma flares by even experienced allergists/ pulmonologists (myself included).   rec max rx for gerd (s ppi as listed as rash) with pepcid/ diet and off acei x 6 weeks then return to clinic to regroup  Advised: The standardized cough guidelines published in Chest by Lissa Morales in 2006 are still the best available  and consist of a multiple step process (up to 12!) , not a single office visit,  and are intended  to address this problem logically,  with an alogrithm dependent on response to empiric treatment at  each progressive step  to determine a specific diagnosis with  minimal addtional testing needed. Therefore if adherence is an issue or can't be accurately verified,  it's very unlikely the standard evaluation and treatment will be successful here.    Furthermore, response to therapy (other than acute cough suppression, which should only be used short term with avoidance of narcotic containing cough syrups if possible), can be a gradual process for which the patient is not likely to  perceive immediate benefit.  Unlike going to an eye doctor where the best perscription is almost always the first one and is immediately effective, this is almost never the case in the management of chronic cough syndromes. Therefore the patient needs to commit up front to consistently adhere to recommendations  for up to 6 weeks of therapy directed at the likely underlying problem(s) before the response can be reasonably evaluated.        Essential hypertension D/c acei 09/28/2021 due to refractory cough   In the best review of chronic cough to date ( NEJM 2016 375 4627-0350) ,  ACEi are now felt to cause cough in up to  20% of pts which is a 4 fold increase from previous reports and does not include the variety of non-specific complaints we see in pulmonary clinic in pts on ACEi but previously attributed to another dx like  Copd/asthma and  include PNDS, throat and chest congestion, "bronchitis", unexplained dyspnea and noct "strangling" sensations, and hoarseness, but also  atypical /refractory GERD symptoms like dysphagia and "bad heartburn"   The only way I know  to prove this is not an "ACEi Case" is a trial off ACEi x a minimum of 6 weeks then regroup.   Developed rash while on ppi and ARB so suspect the problem was the  former and should be able to tol dioavan 160-12.5 one daily and if not change to Beta blocker in this middle aged wm.  DOE (dyspnea on exertion) Onset 08/24/20 p covid 19 - 09/28/2021   Walked on RA  x  3  lap(s) =  approx  450  ft  @ fast pace, stopped due to end of study min sob last lap with lowest 02 sats 95%  cxr is not impressive at all for any kind of post covid complication and exam is clear.    Suspect this is just a conditioning issue vs effects of gerd or ACEi so will focus on cough and regroup in 6 weeks for additional w/u if needed      Christinia Gully, MD 09/28/2021

## 2021-09-28 NOTE — Assessment & Plan Note (Signed)
D/c acei 09/28/2021 due to refractory cough   In the best review of chronic cough to date ( NEJM 2016 375 8299-3716) ,  ACEi are now felt to cause cough in up to  20% of pts which is a 4 fold increase from previous reports and does not include the variety of non-specific complaints we see in pulmonary clinic in pts on ACEi but previously attributed to another dx like  Copd/asthma and  include PNDS, throat and chest congestion, "bronchitis", unexplained dyspnea and noct "strangling" sensations, and hoarseness, but also  atypical /refractory GERD symptoms like dysphagia and "bad heartburn"   The only way I know  to prove this is not an "ACEi Case" is a trial off ACEi x a minimum of 6 weeks then regroup.   Developed rash while on ppi and ARB so suspect the problem was the former and should be able to tol dioavan 160-12.5 one daily and if not change to Beta blocker in this middle aged wm.

## 2021-09-28 NOTE — Assessment & Plan Note (Signed)
Onset 04/2021 assoc with ST on ACEi > d/c 09/28/2021   The most common causes of chronic cough in immunocompetent adults include the following: upper airway cough syndrome (UACS), previously referred to as postnasal drip syndrome (PNDS), which is caused by variety of rhinosinus conditions; (2) asthma; (3) GERD; (4) chronic bronchitis from cigarette smoking or other inhaled environmental irritants; (5) nonasthmatic eosinophilic bronchitis; and (6) bronchiectasis.   These conditions, singly or in combination, have accounted for up to 94% of the causes of chronic cough in prospective studies.   Other conditions have constituted no >6% of the causes in prospective studies These have included bronchogenic carcinoma, chronic interstitial pneumonia, sarcoidosis, left ventricular failure, ACEI-induced cough, and aspiration from a condition associated with pharyngeal dysfunction.    Chronic cough is often simultaneously caused by more than one condition. A single cause has been found from 38 to 82% of the time, multiple causes from 18 to 62%. Multiply caused cough has been the result of three diseases up to 42% of the time.    I strongly suspect ACEi in pt with tendency to Upper airway cough syndrome (previously labeled PNDS),  is so named because it's frequently impossible to sort out how much is  CR/sinusitis with freq throat clearing (which can be related to primary GERD)   vs  causing  secondary (" extra esophageal")  GERD from wide swings in gastric pressure that occur with throat clearing, often  promoting self use of mint and menthol lozenges that reduce the lower esophageal sphincter tone and exacerbate the problem further in a cyclical fashion.   These are the same pts (now being labeled as having "irritable larynx syndrome" by some cough centers) who not infrequently have a history of having failed to tolerate ace inhibitors,  dry powder inhalers or biphosphonates or report having  atypical/extraesophageal reflux symptoms that don't respond to standard doses of PPI  and are easily confused as having aecopd or asthma flares by even experienced allergists/ pulmonologists (myself included).   rec max rx for gerd (s ppi as listed as rash) with pepcid/ diet and off acei x 6 weeks then return to clinic to regroup  Advised: The standardized cough guidelines published in Chest by Lissa Morales in 2006 are still the best available and consist of a multiple step process (up to 12!) , not a single office visit,  and are intended  to address this problem logically,  with an alogrithm dependent on response to empiric treatment at  each progressive step  to determine a specific diagnosis with  minimal addtional testing needed. Therefore if adherence is an issue or can't be accurately verified,  it's very unlikely the standard evaluation and treatment will be successful here.    Furthermore, response to therapy (other than acute cough suppression, which should only be used short term with avoidance of narcotic containing cough syrups if possible), can be a gradual process for which the patient is not likely to  perceive immediate benefit.  Unlike going to an eye doctor where the best perscription is almost always the first one and is immediately effective, this is almost never the case in the management of chronic cough syndromes. Therefore the patient needs to commit up front to consistently adhere to recommendations  for up to 6 weeks of therapy directed at the likely underlying problem(s) before the response can be reasonably evaluated.

## 2021-09-28 NOTE — Patient Instructions (Addendum)
Increase  Pepcid (famotidine)  to take  20 mg after breakfast and supper until return to office - this is the best way to tell whether stomach acid is contributing to your problem.     GERD (REFLUX)  is an extremely common cause of respiratory symptoms just like yours , many times with no obvious heartburn at all.    It can be treated with medication, but also with lifestyle changes including elevation of the head of your bed (ideally with 6 -8inch blocks under the headboard of your bed),  Smoking cessation, avoidance of late meals, excessive alcohol, and avoid fatty foods, chocolate, peppermint, colas, red wine, and acidic juices such as orange juice.  NO MINT OR MENTHOL PRODUCTS SO NO COUGH DROPS  USE SUGARLESS CANDY INSTEAD (Jolley ranchers or Stover's or Life Savers) or even ice chips will also do - the key is to swallow to prevent all throat clearing. NO OIL BASED VITAMINS - use powdered substitutes.  Avoid fish oil when coughing.   Stop lisinopirl and start  valsartan 160-12.5 one daily in its place   Please schedule a follow up office visit in 6 weeks, call sooner if needed

## 2021-09-28 NOTE — Assessment & Plan Note (Addendum)
Onset 08/24/20 p covid 19 - 09/28/2021   Walked on RA  x  3  lap(s) =  approx 450  ft  @ fast pace, stopped due to end of study min sob last lap with lowest 02 sats 95%  cxr is not impressive at all for any kind of post covid complication and exam is clear.    Suspect this is just a conditioning issue vs effects of gerd or ACEi so will focus on cough and regroup in 6 weeks for additional w/u if needed    Each maintenance medication was reviewed in detail including emphasizing most importantly the difference between maintenance and prns and under what circumstances the prns are to be triggered using an action plan format where appropriate.  Total time for H and P, chart review, counseling,  directly observing portions of ambulatory 02 saturation study/ and generating customized AVS unique to this office visit / same day charting  > 45 min

## 2021-11-07 ENCOUNTER — Ambulatory Visit: Payer: BC Managed Care – PPO | Admitting: Internal Medicine

## 2021-12-28 ENCOUNTER — Ambulatory Visit
Admission: EM | Admit: 2021-12-28 | Discharge: 2021-12-28 | Disposition: A | Discharge status: Home / Self Care | Payer: BC Managed Care – PPO

## 2021-12-28 DIAGNOSIS — G8929 Other chronic pain: Secondary | ICD-10-CM | POA: Diagnosis not present

## 2021-12-28 DIAGNOSIS — R1011 Right upper quadrant pain: Secondary | ICD-10-CM

## 2021-12-28 NOTE — ED Triage Notes (Signed)
Pt reports having sharp stabbing  pain from the navel to to the right flank, nausea, vomiting  x 1 day.

## 2021-12-28 NOTE — ED Provider Notes (Signed)
[facility]    CSN: [id number] Arrival date & time: 12/28/21  0815      History   Chief Complaint Chief Complaint  Patient presents with   Abdominal Pain    HPI Scott Moss is a 46 y.o. male.   The patient is a 47 year old male who presents with abdominal pain.  Patient states symptoms started 1 day ago after he coughed.  Patient states since that time he has been experiencing a "sharp/stabbing pain" that extends from the epigastric region into the right upper quadrant.  Patient states pain comes and goes.  He also complains of nausea and vomiting.  Patient states he has vomited twice.  Patient states he has not eaten since his symptoms started.  He denies fever, chills, urinary symptoms.  His last bowel movement was today, which she states was normal.  Patient does have a history of reflux disease.  Patient states he has not taken any medication for his symptoms.  Patient has a history of an appendectomy.    Past Medical History:  Diagnosis Date   Arthritis    Atypical chest pain 07/06/2015   Non-obstructive CAD on [facility] 04/2010.   Chondromalacia of left knee 12/8097   Complication of anesthesia    Family history of cancer    multiple family members, multiple types.   Sees Dr. Marin Olp   Family history of cancer    Family history of premature CAD    GERD (gastroesophageal reflux disease)    H/O cardiovascular stress test 07/14/2015   myocardial perfusion scan, normal, EF 45-54% mildy reduced LV function, Dr. Oval Linsey   H/O echocardiogram 07/18/2015   LV mild hypertrophy, 55-60%EF, Dr. Oval Linsey   High cholesterol    Hypertension    Obesity    Overweight    PONV (postoperative nausea and vomiting)    Testicular cancer ([place]) 02/2005   Dr. Marin Olp    Patient Active Problem List   Diagnosis Date Noted   Mucoid otitis media of both ears 08/14/2021   Respiratory tract infection 08/14/2021   Screening for diabetes mellitus 08/14/2021   Screening for prostate  cancer 08/14/2021   Impaired fasting blood sugar 08/14/2021   DOE (dyspnea on exertion) 06/08/2021   Allergic rhinitis 11/07/2020   History of COVID-19 11/07/2020   Cough 08/22/2020   History of prostatitis 12/16/2019   Deformity of hip joint 10/16/2018   Fatigue 06/11/2017   Hyperlipidemia 01/29/2017   Fatty liver 01/29/2017   Encounter for health maintenance examination in adult 04/03/2016   Family history of premature CAD 04/03/2016   Family history of cancer 04/03/2016   Obesity 04/03/2016   Hyperhidrosis 04/03/2016   Influenza vaccination declined 04/03/2016   Abnormal hearing screen 04/03/2016   H/O testicular cancer 12/02/2011   Essential hypertension     Past Surgical History:  Procedure Laterality Date   ANTERIOR CERVICAL DECOMP/DISCECTOMY FUSION  11/11/2008   C5-6   APPENDECTOMY     CARDIAC CATHETERIZATION  04/13/2010   no disease   CERVICAL SPINE SURGERY  08/05/2013   Dr. Deri Fuelling   COLONOSCOPY     prior, no problems per patient, year unknown, possibly in early 35s.   HIP ARTHROPLASTY Left 03/2020   HIP ARTHROPLASTY Right 04/2020   KNEE ARTHROSCOPY Right    KNEE ARTHROSCOPY Left 10/19/2015   Procedure: LEFT KNEE SCOPE CHONDROPLASTY;  Surgeon: [name], MD;  Location: [place];  Service: Orthopedics;  Laterality: Left;   KNEE ARTHROSCOPY WITH LATERAL RELEASE  Left 09/18/2012   Procedure: KNEE ARTHROSCOPY WITH LATERAL RELEASE;  Surgeon: [name], MD;  Location: [place];  Service: Orthopedics;  Laterality: Left;  WITH DEBRIDEMENT AND SHAVING(CHONDROPLASTY), Excision Medial Plica   NASAL SINUS SURGERY  08/05/2012   PARTIAL KNEE ARTHROPLASTY Left 08/15/2016   Procedure: PATELLA FEMORAL REPLACEMENT LEFT KNEE;  Surgeon: [name], MD;  Location: [place];  Service: Orthopedics;  Laterality: Left;   RADICAL ORCHIECTOMY  02/07/2005   left; with left testicular implant       Home Medications     Prior to Admission medications   Medication Sig Start Date End Date Taking? Authorizing Provider  acetaminophen (TYLENOL) 500 MG tablet Take 500 mg by mouth every 6 (six) hours as needed.    [provider]  aspirin (ASPIRIN LOW DOSE) 81 MG EC tablet TAKE 1 TABLET(81 MG) BY MOUTH DAILY. SWALLOW WHOLE 08/14/21   Tysinger, Camelia Eng, PA-C  chlorpheniramine-HYDROcodone (TUSSIONEX PENNKINETIC ER) 10-8 MG/5ML SUER Take 5 mLs by mouth every 12 (twelve) hours as needed for cough. 08/14/21   Tysinger, Camelia Eng, PA-C  famotidine (PEPCID) 20 MG tablet Take 1 tablet (20 mg total) by mouth daily. 08/14/21   Tysinger, Camelia Eng, PA-C  oxybutynin (DITROPAN) 5 MG tablet Take 1 tablet (5 mg total) by mouth 2 (two) times daily. 08/15/21   Tysinger, Camelia Eng, PA-C  rosuvastatin (CRESTOR) 10 MG tablet TAKE 1 TABLET(10 MG) BY MOUTH DAILY 08/14/21   Tysinger, Camelia Eng, PA-C  valsartan-hydrochlorothiazide (DIOVAN HCT) 160-12.5 MG tablet Take 1 tablet by mouth daily. 09/28/21   Tanda Rockers, MD    Family History Family History  Problem Relation Age of Onset   Ovarian cancer Mother        uterine, mets   Hypertension Mother    Stroke Mother    Migraines Mother    Cancer Mother    Lung cancer Father    Hypertension Father    Heart attack Father 52   Skin cancer Father        ?   Heart disease Father    Cancer Father        lung   Cancer Sister 31       multiple myeloma   Hypertension Sister    Migraines Sister    Diabetes Maternal Uncle    Colon cancer Maternal Uncle    Esophageal cancer Maternal Uncle    Heart attack Maternal Uncle    Heart attack Paternal Uncle    Stomach cancer Neg Hx    Colon polyps Neg Hx    Rectal cancer Neg Hx     Social History Social History   Tobacco Use   Smoking status: Never    Passive exposure: Never   Smokeless tobacco: Never   Tobacco comments:    never used tobacco  Vaping Use   Vaping Use: Never used  Substance Use Topics   Alcohol use: Yes     Alcohol/week: 3.0 standard drinks    Types: 3 Cans of beer per week    Comment: social   Drug use: No     Allergies   Benicar [olmesartan] and Nexium [esomeprazole magnesium]   Review of Systems Review of Systems PER HPI  Physical Exam Triage Vital Signs ED Triage Vitals  Enc Vitals Group     BP 12/28/21 0904 135/88     Pulse Rate 12/28/21 0904 77     Resp 12/28/21 0904 18  Temp 12/28/21 0904 97.9 F (36.6 C)     Temp Source 12/28/21 0904 Oral     SpO2 12/28/21 0904 95 %     Weight --      Height --      Head Circumference --      Peak Flow --      Pain Score 12/28/21 0903 8     Pain Loc --      Pain Edu? --      Excl. in [place]? --    No data found.  Updated Vital Signs BP 135/88 (BP Location: Right Arm)   Pulse 77   Temp 97.9 F (36.6 C) (Oral)   Resp 18   SpO2 95%   Visual Acuity Right Eye Distance:   Left Eye Distance:   Bilateral Distance:    Right Eye Near:   Left Eye Near:    Bilateral Near:     Physical Exam Vitals and nursing note reviewed.  Constitutional:      General: He is in acute distress (appears uncomfortable).     Appearance: He is well-developed.  HENT:     Head: Normocephalic.  Cardiovascular:     Rate and Rhythm: Normal rate and regular rhythm.     Heart sounds: Normal heart sounds.  Pulmonary:     Effort: Pulmonary effort is normal.     Breath sounds: Normal breath sounds.  Abdominal:     Tenderness: There is abdominal tenderness in the right upper quadrant and epigastric area. There is rebound. There is no right CVA tenderness or left CVA tenderness.     Comments: firm  Skin:    General: Skin is warm and dry.  Neurological:     General: No focal deficit present.     Mental Status: He is alert and oriented to person, place, and time.  Psychiatric:        Mood and Affect: Mood normal.        Behavior: Behavior normal.     UC Treatments / Results  Labs (all labs ordered are listed, but only abnormal results are  displayed) Labs Reviewed - No data to display  EKG   Radiology No results found.  Procedures Procedures (including critical care time)  Medications Ordered in UC Medications - No data to display  Initial Impression / Assessment and Plan / UC Course  I have reviewed the triage vital signs and the nursing notes.  Pertinent labs & imaging results that were available during my care of the patient were reviewed by me and considered in my medical decision making (see chart for details).  The patient is a 46 year old male who presents with abdominal pain.  Symptoms have been present for 1 day.  On exam, patient has moderate abdominal tenderness from the epigastric region that extends into the right upper quadrant.  Patient states pain has been coming and going, describes the pain as "stabbing and sharp".  Patient patient's symptoms, patient would most likely benefit from advanced imaging.  Patient returns for referred to the ER for further evaluation. Final Clinical Impressions(s) / UC Diagnoses   Final diagnoses:  Abdominal pain, chronic, right upper quadrant     Discharge Instructions      Based on your symptoms, please go to the ER for further evaluation.     ED Prescriptions   None    PDMP not reviewed this encounter.   Tish Men, NP 12/28/21 [phone]

## 2021-12-28 NOTE — Discharge Instructions (Addendum)
Based on your symptoms, please go to the ER for further evaluation.

## 2021-12-29 ENCOUNTER — Ambulatory Visit
Admission: EM | Admit: 2021-12-29 | Discharge: 2021-12-29 | Disposition: A | Discharge status: Home / Self Care | Payer: BC Managed Care – PPO | Attending: Nurse Practitioner | Admitting: Nurse Practitioner

## 2021-12-29 ENCOUNTER — Emergency Department (HOSPITAL_COMMUNITY)
Admission: EM | Admit: 2021-12-29 | Discharge: 2021-12-29 | Disposition: A | Discharge status: Home / Self Care | Payer: BC Managed Care – PPO | Attending: Emergency Medicine | Admitting: Emergency Medicine

## 2021-12-29 ENCOUNTER — Emergency Department (HOSPITAL_COMMUNITY): Payer: BC Managed Care – PPO

## 2021-12-29 ENCOUNTER — Encounter (HOSPITAL_COMMUNITY): Payer: Self-pay | Admitting: *Deleted

## 2021-12-29 ENCOUNTER — Ambulatory Visit (INDEPENDENT_AMBULATORY_CARE_PROVIDER_SITE_OTHER): Payer: BC Managed Care – PPO

## 2021-12-29 ENCOUNTER — Encounter: Payer: Self-pay | Admitting: Emergency Medicine

## 2021-12-29 ENCOUNTER — Other Ambulatory Visit: Payer: Self-pay

## 2021-12-29 DIAGNOSIS — R1010 Upper abdominal pain, unspecified: Secondary | ICD-10-CM | POA: Diagnosis not present

## 2021-12-29 DIAGNOSIS — R109 Unspecified abdominal pain: Secondary | ICD-10-CM

## 2021-12-29 DIAGNOSIS — Z79899 Other long term (current) drug therapy: Secondary | ICD-10-CM | POA: Insufficient documentation

## 2021-12-29 DIAGNOSIS — S39011A Strain of muscle, fascia and tendon of abdomen, initial encounter: Secondary | ICD-10-CM | POA: Insufficient documentation

## 2021-12-29 DIAGNOSIS — R1031 Right lower quadrant pain: Secondary | ICD-10-CM | POA: Diagnosis not present

## 2021-12-29 DIAGNOSIS — R1030 Lower abdominal pain, unspecified: Secondary | ICD-10-CM | POA: Diagnosis not present

## 2021-12-29 DIAGNOSIS — R1011 Right upper quadrant pain: Secondary | ICD-10-CM

## 2021-12-29 DIAGNOSIS — R112 Nausea with vomiting, unspecified: Secondary | ICD-10-CM | POA: Diagnosis not present

## 2021-12-29 DIAGNOSIS — S3991XA Unspecified injury of abdomen, initial encounter: Secondary | ICD-10-CM | POA: Diagnosis not present

## 2021-12-29 DIAGNOSIS — I10 Essential (primary) hypertension: Secondary | ICD-10-CM | POA: Insufficient documentation

## 2021-12-29 DIAGNOSIS — Z7982 Long term (current) use of aspirin: Secondary | ICD-10-CM | POA: Insufficient documentation

## 2021-12-29 DIAGNOSIS — X58XXXA Exposure to other specified factors, initial encounter: Secondary | ICD-10-CM | POA: Insufficient documentation

## 2021-12-29 DIAGNOSIS — R197 Diarrhea, unspecified: Secondary | ICD-10-CM

## 2021-12-29 LAB — CBC WITH DIFFERENTIAL/PLATELET
Abs Immature Granulocytes: 0.02 10*3/uL (ref 0.00–0.07)
Basophils Absolute: 0 10*3/uL (ref 0.0–0.1)
Basophils Relative: 0 %
Eosinophils Absolute: 0.2 10*3/uL (ref 0.0–0.5)
Eosinophils Relative: 3 %
HCT: 43.4 % (ref 39.0–52.0)
Hemoglobin: 15 g/dL (ref 13.0–17.0)
Immature Granulocytes: 0 %
Lymphocytes Relative: 24 %
Lymphs Abs: 1.7 10*3/uL (ref 0.7–4.0)
MCH: 30.9 pg (ref 26.0–34.0)
MCHC: 34.6 g/dL (ref 30.0–36.0)
MCV: 89.5 fL (ref 80.0–100.0)
Monocytes Absolute: 0.8 10*3/uL (ref 0.1–1.0)
Monocytes Relative: 11 %
Neutro Abs: 4.5 10*3/uL (ref 1.7–7.7)
Neutrophils Relative %: 62 %
Platelets: 274 10*3/uL (ref 150–400)
RBC: 4.85 MIL/uL (ref 4.22–5.81)
RDW: 12.5 % (ref 11.5–15.5)
WBC: 7.2 10*3/uL (ref 4.0–10.5)
nRBC: 0 % (ref 0.0–0.2)

## 2021-12-29 LAB — COMPREHENSIVE METABOLIC PANEL
ALT: 30 U/L (ref 0–44)
AST: 24 U/L (ref 15–41)
Albumin: 4.1 g/dL (ref 3.5–5.0)
Alkaline Phosphatase: 79 U/L (ref 38–126)
Anion gap: 4 — ABNORMAL LOW (ref 5–15)
BUN: 12 mg/dL (ref 6–20)
CO2: 25 mmol/L (ref 22–32)
Calcium: 8.8 mg/dL — ABNORMAL LOW (ref 8.9–10.3)
Chloride: 109 mmol/L (ref 98–111)
Creatinine, Ser: 0.88 mg/dL (ref 0.61–1.24)
GFR, Estimated: >60 mL/min (ref >60)
Glucose, Bld: 97 mg/dL (ref 70–99)
Potassium: 3.8 mmol/L (ref 3.5–5.1)
Sodium: 138 mmol/L (ref 135–145)
Total Bilirubin: 0.7 mg/dL (ref 0.3–1.2)
Total Protein: 6.9 g/dL (ref 6.5–8.1)

## 2021-12-29 LAB — LIPASE, BLOOD: Lipase: 34 U/L (ref 11–51)

## 2021-12-29 MED ORDER — NAPROXEN 500 MG PO TABS: 500.0000 mg | ORAL_TABLET | Freq: Two times a day (BID) | ORAL | 0 refills | Status: DC

## 2021-12-29 MED ORDER — IOHEXOL 300 MG/ML  SOLN
100.0000 mL | Freq: Once | INTRAMUSCULAR | Status: AC | PRN
  Administered 2021-12-29: 100 mL via INTRAVENOUS

## 2021-12-29 MED ORDER — METHOCARBAMOL 500 MG PO TABS: 500.0000 mg | ORAL_TABLET | Freq: Two times a day (BID) | ORAL | 0 refills | Status: DC | PRN

## 2021-12-29 MED ORDER — MORPHINE SULFATE (PF) 4 MG/ML IV SOLN
4.0000 mg | Freq: Once | INTRAVENOUS | Status: AC
  Administered 2021-12-29: 4 mg via INTRAVENOUS
  Filled 2021-12-29: qty 1

## 2021-12-29 MED ORDER — ONDANSETRON HCL 4 MG/2ML IJ SOLN
INTRAMUSCULAR | Status: AC
  Filled 2021-12-29: qty 2

## 2021-12-29 MED ORDER — ONDANSETRON HCL 4 MG/2ML IJ SOLN
4.0000 mg | Freq: Once | INTRAMUSCULAR | Status: AC
  Administered 2021-12-29: 4 mg via INTRAVENOUS

## 2021-12-29 NOTE — ED Triage Notes (Signed)
States was seen here yesterday.  C/o mid ABD pain that radiates to right side.  States he went to ED and the line was so long, he left.  States it hurts to cough and bend over.  Wants and x-ray done.  States area is tender to touch.

## 2021-12-29 NOTE — ED Triage Notes (Addendum)
Pt with abd pain since Wednesday after coughing.  + nausea, diarrhea this morning Pt sent from UC.

## 2021-12-29 NOTE — Discharge Instructions (Addendum)
Please take Naprosyn, '500mg'$  by mouth twice daily as needed for pain - this in an antiinflammatory medicine (NSAID) and is similar to ibuprofen - many people feel that it is stronger than ibuprofen and it is easier to take since it is a smaller pill.  Please use this only for 1 week - if your pain persists, you will need to follow up with your doctor in the office for ongoing guidance and pain control.  Please take Robaxin, 500 mg up to 2 or 3 times a day as needed for muscle spasm, this is a muscle relaxer, it may cause generalized weakness, sleepiness and you should not drive or do important things while taking this medication.  This includes driving a vehicle or taking care of young children, these things should not be done while taking this medication.  Thank you for allowing [place] to treat you in the emergency department today.  After reviewing your examination and potential testing that was done it appears that you are safe to go home.  I would like for you to follow-up with your doctor within the next several days, have them obtain your results and follow-up with them to review all of these tests.  If you should develop severe or worsening symptoms return to the emergency department immediately

## 2021-12-29 NOTE — ED Provider Notes (Signed)
[facility]    CSN: [id number] Arrival date & time: 12/29/21  1607      History   Chief Complaint No chief complaint on file.   HPI Scott Moss is a 46 y.o. male.   The patient is a 46 year old male who presents with abdominal pain. Patient was seen on 5/26 and was referred to the ER. Xray was also unavailable in this clinic. Patient states he went to the ER, but the wait was too long. Today he returns with the same symptoms. Since one day ago, he has developed diarrhea. He continues to experience a "sharp/stabbing pain" that extends from the epigastric region into the right upper quadrant.  Patient states pain comes and goes.  He also complains of nausea and vomiting.  Patient states he has not eaten since his symptoms started.  He denies fever, chills, urinary symptoms. Patient does have a history of reflux disease.  Patient states he has not taken any medication for his symptoms.  Patient has a history of an appendectomy.  Patient is requesting an x-ray of his abdomen today.  The history is provided by the patient and the spouse.   Past Medical History:  Diagnosis Date   Arthritis    Atypical chest pain 07/06/2015   Non-obstructive CAD on [facility] 04/2010.   Chondromalacia of left knee 10/7104   Complication of anesthesia    Family history of cancer    multiple family members, multiple types.   Sees Dr. Marin Olp   Family history of cancer    Family history of premature CAD    GERD (gastroesophageal reflux disease)    H/O cardiovascular stress test 07/14/2015   myocardial perfusion scan, normal, EF 45-54% mildy reduced LV function, Dr. Oval Linsey   H/O echocardiogram 07/18/2015   LV mild hypertrophy, 55-60%EF, Dr. Oval Linsey   High cholesterol    Hypertension    Obesity    Overweight    PONV (postoperative nausea and vomiting)    Testicular cancer ([place]) 02/2005   Dr. Marin Olp    Patient Active Problem List   Diagnosis Date Noted   Mucoid otitis media of both ears  08/14/2021   Respiratory tract infection 08/14/2021   Screening for diabetes mellitus 08/14/2021   Screening for prostate cancer 08/14/2021   Impaired fasting blood sugar 08/14/2021   DOE (dyspnea on exertion) 06/08/2021   Allergic rhinitis 11/07/2020   History of COVID-19 11/07/2020   Cough 08/22/2020   History of prostatitis 12/16/2019   Deformity of hip joint 10/16/2018   Fatigue 06/11/2017   Hyperlipidemia 01/29/2017   Fatty liver 01/29/2017   Encounter for health maintenance examination in adult 04/03/2016   Family history of premature CAD 04/03/2016   Family history of cancer 04/03/2016   Obesity 04/03/2016   Hyperhidrosis 04/03/2016   Influenza vaccination declined 04/03/2016   Abnormal hearing screen 04/03/2016   H/O testicular cancer 12/02/2011   Essential hypertension     Past Surgical History:  Procedure Laterality Date   ANTERIOR CERVICAL DECOMP/DISCECTOMY FUSION  11/11/2008   C5-6   APPENDECTOMY     CARDIAC CATHETERIZATION  04/13/2010   no disease   CERVICAL SPINE SURGERY  08/05/2013   Dr. Deri Fuelling   COLONOSCOPY     prior, no problems per patient, year unknown, possibly in early 73s.   HIP ARTHROPLASTY Left 03/2020   HIP ARTHROPLASTY Right 04/2020   KNEE ARTHROSCOPY Right    KNEE ARTHROSCOPY Left 10/19/2015   Procedure: LEFT KNEE SCOPE CHONDROPLASTY;  Surgeon:  [name], MD;  Location: [place];  Service: Orthopedics;  Laterality: Left;   KNEE ARTHROSCOPY WITH LATERAL RELEASE Left 09/18/2012   Procedure: KNEE ARTHROSCOPY WITH LATERAL RELEASE;  Surgeon: [name], MD;  Location: [place];  Service: Orthopedics;  Laterality: Left;  WITH DEBRIDEMENT AND SHAVING(CHONDROPLASTY), Excision Medial Plica   NASAL SINUS SURGERY  08/05/2012   PARTIAL KNEE ARTHROPLASTY Left 08/15/2016   Procedure: PATELLA FEMORAL REPLACEMENT LEFT KNEE;  Surgeon: [name], MD;  Location: [place];  Service:  Orthopedics;  Laterality: Left;   RADICAL ORCHIECTOMY  02/07/2005   left; with left testicular implant       Home Medications    Prior to Admission medications   Medication Sig Start Date End Date Taking? Authorizing Provider  acetaminophen (TYLENOL) 500 MG tablet Take 500 mg by mouth every 6 (six) hours as needed.    [provider]  aspirin (ASPIRIN LOW DOSE) 81 MG EC tablet TAKE 1 TABLET(81 MG) BY MOUTH DAILY. SWALLOW WHOLE 08/14/21   Tysinger, Camelia Eng, PA-C  chlorpheniramine-HYDROcodone (TUSSIONEX PENNKINETIC ER) 10-8 MG/5ML SUER Take 5 mLs by mouth every 12 (twelve) hours as needed for cough. 08/14/21   Tysinger, Camelia Eng, PA-C  famotidine (PEPCID) 20 MG tablet Take 1 tablet (20 mg total) by mouth daily. 08/14/21   Tysinger, Camelia Eng, PA-C  oxybutynin (DITROPAN) 5 MG tablet Take 1 tablet (5 mg total) by mouth 2 (two) times daily. 08/15/21   Tysinger, Camelia Eng, PA-C  rosuvastatin (CRESTOR) 10 MG tablet TAKE 1 TABLET(10 MG) BY MOUTH DAILY 08/14/21   Tysinger, Camelia Eng, PA-C  valsartan-hydrochlorothiazide (DIOVAN HCT) 160-12.5 MG tablet Take 1 tablet by mouth daily. 09/28/21   Tanda Rockers, MD    Family History Family History  Problem Relation Age of Onset   Ovarian cancer Mother        uterine, mets   Hypertension Mother    Stroke Mother    Migraines Mother    Cancer Mother    Lung cancer Father    Hypertension Father    Heart attack Father 45   Skin cancer Father        ?   Heart disease Father    Cancer Father        lung   Cancer Sister 90       multiple myeloma   Hypertension Sister    Migraines Sister    Diabetes Maternal Uncle    Colon cancer Maternal Uncle    Esophageal cancer Maternal Uncle    Heart attack Maternal Uncle    Heart attack Paternal Uncle    Stomach cancer Neg Hx    Colon polyps Neg Hx    Rectal cancer Neg Hx     Social History Social History   Tobacco Use   Smoking status: Never    Passive exposure: Never   Smokeless tobacco: Never    Tobacco comments:    never used tobacco  Vaping Use   Vaping Use: Never used  Substance Use Topics   Alcohol use: Yes    Alcohol/week: 3.0 standard drinks    Types: 3 Cans of beer per week    Comment: social   Drug use: No     Allergies   Benicar [olmesartan] and Nexium [esomeprazole magnesium]   Review of Systems Review of Systems PER HPI  Physical Exam Triage Vital Signs ED Triage Vitals  Enc Vitals Group     BP 12/28/21  0904 135/88     Pulse Rate 12/28/21 0904 77     Resp 12/28/21 0904 18     Temp 12/28/21 0904 97.9 F (36.6 C)     Temp Source 12/28/21 0904 Oral     SpO2 12/28/21 0904 95 %     Weight --      Height --      Head Circumference --      Peak Flow --      Pain Score 12/28/21 0903 8     Pain Loc --      Pain Edu? --      Excl. in [place]? --    No data found.  Updated Vital Signs BP 113/76 (BP Location: Right Arm)   Pulse 81   Temp 97.9 F (36.6 C) (Oral)   Resp 18   SpO2 96%   Visual Acuity Right Eye Distance:   Left Eye Distance:   Bilateral Distance:    Right Eye Near:   Left Eye Near:    Bilateral Near:     Physical Exam Vitals and nursing note reviewed.  Constitutional:      General: He is in acute distress (appears uncomfortable).     Appearance: He is well-developed.  HENT:     Head: Normocephalic.  Cardiovascular:     Rate and Rhythm: Normal rate and regular rhythm.     Heart sounds: Normal heart sounds.  Pulmonary:     Effort: Pulmonary effort is normal.     Breath sounds: Normal breath sounds.  Abdominal:     Tenderness: There is abdominal tenderness in the right upper quadrant and epigastric area. There is rebound. There is no right CVA tenderness or left CVA tenderness.     Comments: firm  Skin:    General: Skin is warm and dry.  Neurological:     General: No focal deficit present.     Mental Status: He is alert and oriented to person, place, and time.  Psychiatric:        Mood and Affect: Mood normal.         Behavior: Behavior normal.     UC Treatments / Results  Labs (all labs ordered are listed, but only abnormal results are displayed) Labs Reviewed - No data to display  EKG   Radiology DG Abd 1 View  Result Date: 12/29/2021 CLINICAL DATA:  Right upper quadrant abdominal pain for 1 week with nausea, vomiting and diarrhea. Previous appendectomy. EXAM: ABDOMEN - 1 VIEW COMPARISON:  Abdomen and pelvis CT dated 03/05/2018 FINDINGS: Normal bowel gas pattern. Bilateral total hip prostheses. Minimal levoconvex thoracolumbar rotary scoliosis. IMPRESSION: No acute abnormality. Electronically Signed   By: Claudie Revering M.D.   On: 12/29/2021 10:44    Procedures Procedures (including critical care time)  Medications Ordered in UC Medications - No data to display  Initial Impression / Assessment and Plan / UC Course  I have reviewed the triage vital signs and the nursing notes.  Pertinent labs & imaging results that were available during my care of the patient were reviewed by me and considered in my medical decision making (see chart for details).  The patient is a 46 year old male who presents with abdominal pain.  On exam, patient continues to have moderate abdominal tenderness from the epigastric region that extends into the right upper quadrant.  X-ray was performed at the patient's request, which was negative for any abnormality.  Patient patient's symptoms, patient would most likely benefit from advanced imaging.  Patient was referred to the ER for further evaluation Final Clinical Impressions(s) / UC Diagnoses   Final diagnoses:  Right upper quadrant abdominal pain     Discharge Instructions      Go to the ER for worsening symptoms and for further evaluation.       ED Prescriptions   None    PDMP not reviewed this encounter.   Tish Men, NP 12/29/21 1056    Sylina Henion-Warren, Alda Lea, NP 12/29/21 1056

## 2021-12-29 NOTE — ED Notes (Signed)
Patient is being discharged from the Urgent Care and sent to the Emergency Department via private vehicle . Per NP, patient is in need of higher level of care due to ABD pain. Patient is aware and verbalizes understanding of plan of care.  Vitals:   12/29/21 1023  BP: 113/76  Pulse: 81  Resp: 18  Temp: 97.9 F (36.6 C)  SpO2: 96%

## 2021-12-29 NOTE — Discharge Instructions (Addendum)
Go to the ER for worsening symptoms and for further evaluation.

## 2021-12-29 NOTE — ED Provider Notes (Signed)
[facility] EMERGENCY DEPARTMENT Provider Note   CSN: [id number] Arrival date & time: 12/29/21  1106     History  Chief Complaint  Patient presents with   Abdominal Pain    BRISTON LAX is a 46 y.o. male.   Abdominal Pain  This patient is a 46 year old male, he has a prior history of an appendectomy in the 90s, but no other abdominal surgical history.  He presents to the hospital today from the urgent care where he had been seen because of abdominal pain.  An x-ray was ultimately performed, there is no acute findings and he was referred to the emergency department for advanced imaging.  The patient reports to me that his abdominal pain started about 4 days ago.  He had a very hard cough that happened and during the cough spell he felt acute onset of this sharp stabbing burning pain in the periumbilical region which seems to radiate into the right side.  It does not go to his back and it has not been associated with any vomiting.  He did have 1 soft stool this morning but no watery stools and no bloody stools.  He has no prior obstructive bowel history, no prior hernias.  Symptoms are persistent and worse with palpation or trying to do a change in position such as sitting up.  Home Medications Prior to Admission medications   Medication Sig Start Date End Date Taking? Authorizing Provider  methocarbamol (ROBAXIN) 500 MG tablet Take 1 tablet (500 mg total) by mouth 2 (two) times daily as needed for muscle spasms. 12/29/21  Yes Noemi Chapel, MD  naproxen (NAPROSYN) 500 MG tablet Take 1 tablet (500 mg total) by mouth 2 (two) times daily with a meal. 12/29/21  Yes Noemi Chapel, MD  acetaminophen (TYLENOL) 500 MG tablet Take 500 mg by mouth every 6 (six) hours as needed.    [provider]  aspirin (ASPIRIN LOW DOSE) 81 MG EC tablet TAKE 1 TABLET(81 MG) BY MOUTH DAILY. SWALLOW WHOLE 08/14/21   Tysinger, Camelia Eng, PA-C  chlorpheniramine-HYDROcodone (TUSSIONEX PENNKINETIC ER) 10-8 MG/5ML  SUER Take 5 mLs by mouth every 12 (twelve) hours as needed for cough. 08/14/21   Tysinger, Camelia Eng, PA-C  famotidine (PEPCID) 20 MG tablet Take 1 tablet (20 mg total) by mouth daily. 08/14/21   Tysinger, Camelia Eng, PA-C  oxybutynin (DITROPAN) 5 MG tablet Take 1 tablet (5 mg total) by mouth 2 (two) times daily. 08/15/21   Tysinger, Camelia Eng, PA-C  rosuvastatin (CRESTOR) 10 MG tablet TAKE 1 TABLET(10 MG) BY MOUTH DAILY 08/14/21   Tysinger, Camelia Eng, PA-C  valsartan-hydrochlorothiazide (DIOVAN HCT) 160-12.5 MG tablet Take 1 tablet by mouth daily. 09/28/21   Tanda Rockers, MD      Allergies    Benicar [olmesartan] and Nexium [esomeprazole magnesium]    Review of Systems   Review of Systems  Gastrointestinal:  Positive for abdominal pain.  All other systems reviewed and are negative.  Physical Exam Updated Vital Signs BP 105/72   Pulse 69   Temp 97.7 F (36.5 C) (Oral)   Resp 15   Ht 1.803 m ('5\' 11"'$ )   Wt 104.3 kg   SpO2 93%   BMI 32.08 kg/m  Physical Exam Vitals and nursing note reviewed.  Constitutional:      General: He is not in acute distress.    Appearance: He is well-developed.  HENT:     Head: Normocephalic and atraumatic.     Mouth/Throat:  Pharynx: No oropharyngeal exudate.  Eyes:     General: No scleral icterus.       Right eye: No discharge.        Left eye: No discharge.     Conjunctiva/sclera: Conjunctivae normal.     Pupils: Pupils are equal, round, and reactive to light.  Neck:     Thyroid: No thyromegaly.     Vascular: No JVD.  Cardiovascular:     Rate and Rhythm: Normal rate and regular rhythm.     Heart sounds: Normal heart sounds. No murmur heard.   No friction rub. No gallop.  Pulmonary:     Effort: Pulmonary effort is normal. No respiratory distress.     Breath sounds: Normal breath sounds. No wheezing or rales.  Abdominal:     General: Bowel sounds are normal. There is no distension.     Palpations: Abdomen is soft. There is no mass.      Tenderness: There is no abdominal tenderness.     Comments: Tender to palpation in the periumbilical region, no obvious hernia is visualized or palpated, there is no Murphy sign, no pain in the lower abdomen, no tenderness in the lower abdomen, very soft, nonperitoneal  Musculoskeletal:        General: No tenderness. Normal range of motion.     Cervical back: Normal range of motion and neck supple.  Lymphadenopathy:     Cervical: No cervical adenopathy.  Skin:    General: Skin is warm and dry.     Findings: No erythema or rash.  Neurological:     Mental Status: He is alert.     Coordination: Coordination normal.  Psychiatric:        Behavior: Behavior normal.    ED Results / Procedures / Treatments   Labs (all labs ordered are listed, but only abnormal results are displayed) Labs Reviewed  COMPREHENSIVE METABOLIC PANEL - Abnormal; Notable for the following components:      Result Value   Calcium 8.8 (*)    Anion gap 4 (*)    All other components within normal limits  CBC WITH DIFFERENTIAL/PLATELET  LIPASE, BLOOD    EKG None  Radiology DG Abd 1 View  Result Date: 12/29/2021 CLINICAL DATA:  Right upper quadrant abdominal pain for 1 week with nausea, vomiting and diarrhea. Previous appendectomy. EXAM: ABDOMEN - 1 VIEW COMPARISON:  Abdomen and pelvis CT dated 03/05/2018 FINDINGS: Normal bowel gas pattern. Bilateral total hip prostheses. Minimal levoconvex thoracolumbar rotary scoliosis. IMPRESSION: No acute abnormality. Electronically Signed   By: Claudie Revering M.D.   On: 12/29/2021 10:44   CT ABDOMEN PELVIS W CONTRAST  Result Date: 12/29/2021 CLINICAL DATA:  Severe mid to lower abdominal pain for 4 days. Coughing. Nausea. Diarrhea this morning. History of testicular carcinoma. EXAM: CT ABDOMEN AND PELVIS WITH CONTRAST TECHNIQUE: Multidetector CT imaging of the abdomen and pelvis was performed using the standard protocol following bolus administration of intravenous contrast.  RADIATION DOSE REDUCTION: This exam was performed according to the departmental dose-optimization program which includes automated exposure control, adjustment of the mA and/or kV according to patient size and/or use of iterative reconstruction technique. CONTRAST:  198m OMNIPAQUE IOHEXOL 300 MG/ML  SOLN COMPARISON:  03/05/2018. FINDINGS: Lower chest: No acute abnormality. Hepatobiliary: Liver normal in size. Diffuse decreased liver attenuation consistent with fatty infiltration. No mass or focal lesion. No gallstones, gallbladder wall thickening, or biliary dilatation. Pancreas: Unremarkable. No pancreatic ductal dilatation or surrounding inflammatory changes. Spleen: Normal in size without  focal abnormality. Adrenals/Urinary Tract: Adrenal glands are unremarkable. Kidneys are normal, without renal calculi, focal lesion, or hydronephrosis. Bladder is unremarkable. Stomach/Bowel: Normal stomach. Small bowel and colon are normal in caliber. No wall thickening. No inflammation. Vascular/Lymphatic: No significant vascular findings are present. No enlarged abdominal or pelvic lymph nodes. Reproductive: Unremarkable. Other: Minimal fat containing umbilical hernia, unchanged from the prior CT. No bowel enters this. No other hernia. No ascites. Musculoskeletal: Bilateral total hip arthroplasties appear well seated and aligned. No fracture or acute finding. No bone lesion. IMPRESSION: 1. No acute findings within the abdomen or pelvis. No bowel obstruction or inflammation. 2. Hepatic steatosis. 3. Minimal fat containing umbilical hernia stable from the 2019 CT scan. Electronically Signed   By: Lajean Manes M.D.   On: 12/29/2021 13:06    Procedures Procedures    Medications Ordered in ED Medications  ondansetron (ZOFRAN) 4 MG/2ML injection (has no administration in time range)  morphine (PF) 4 MG/ML injection 4 mg (4 mg Intravenous Given 12/29/21 1150)  ondansetron (ZOFRAN) injection 4 mg (4 mg Intravenous Given  12/29/21 1149)  iohexol (OMNIPAQUE) 300 MG/ML solution 100 mL (100 mLs Intravenous Contrast Given 12/29/21 1242)    ED Course/ Medical Decision Making/ A&P                           Medical Decision Making Amount and/or Complexity of Data Reviewed Labs: ordered. Radiology: ordered.  Risk Prescription drug management.   This patient presents to the ED for concern of abdominal pain, this involves an extensive number of treatment options, and is a complaint that carries with it a high risk of complications and morbidity.  The differential diagnosis includes incarcerated abdominal hernia, abdominal wall muscular strain or tear, possible bowel obstruction, less likely to be cholecystitis given the onset of the pain with coughing   Co morbidities that complicate the patient evaluation  Overweight, hypertension   Additional history obtained:  Additional history obtained from electronic medical record External records from outside source obtained and reviewed including urgent care notes, prior imaging   Lab Tests:  I Ordered, and personally interpreted labs.  The pertinent results include: CBC metabolic panel and lipase, they show no leukocytosis, normal metabolic panel and lipase   Imaging Studies ordered:  I ordered imaging studies including CT scan of the abdomen and pelvis I independently visualized and interpreted imaging which showed no acute findings other than a small piece of fat-containing umbilical hernia, there is no bowel in the hernia and there is no bowel obstruction I agree with the radiologist interpretation   Cardiac Monitoring: / EKG:  The patient was maintained on a cardiac monitor.  I personally viewed and interpreted the cardiac monitored which showed an underlying rhythm of: Normal sinus rhythm   Consultations Obtained:  No surgical consultation needed based on patient's exam and CT scan findings  Problem List / ED Course / Critical interventions /  Medication management  The patient ultimately had some improvement with pain medications, I ordered medication including morphine and IV fluids for abdominal pain Reevaluation of the patient after these medicines showed that the patient improved I have reviewed the patients home medicines and have made adjustments as needed   Social Determinants of Health:  None   Test / Admission - Considered:  Considered admission but the patient's pain is improved, vital signs are normal and there is no surgical pathology on the CT scan, I suspect that he has had  a muscle wall strain   I have discussed with the patient at the bedside the results, and the meaning of these results.  They have expressed her understanding to the need for follow-up with primary care physician         Final Clinical Impression(s) / ED Diagnoses Final diagnoses:  Abdominal pain, unspecified abdominal location  Abdominal wall strain, initial encounter    Rx / DC Orders ED Discharge Orders          Ordered    naproxen (NAPROSYN) 500 MG tablet  2 times daily with meals        12/29/21 1321    methocarbamol (ROBAXIN) 500 MG tablet  2 times daily PRN        12/29/21 1321              Noemi Chapel, MD 12/29/21 1322

## 2022-01-01 ENCOUNTER — Telehealth: Payer: Self-pay | Admitting: Medical

## 2022-01-01 NOTE — Telephone Encounter (Signed)
Pt was called concerning recent er visit. Pt states that he is a lot better and does not need follow up. Pt was advised to call back if situation changes.

## 2022-03-22 ENCOUNTER — Ambulatory Visit: Payer: BC Managed Care – PPO | Admitting: Medical

## 2022-03-22 VITALS — BP 122/84 | HR 71 | Temp 98.7°F | Wt 241.2 lb

## 2022-03-22 DIAGNOSIS — J209 Acute bronchitis, unspecified: Secondary | ICD-10-CM | POA: Diagnosis not present

## 2022-03-22 DIAGNOSIS — H66003 Acute suppurative otitis media without spontaneous rupture of ear drum, bilateral: Secondary | ICD-10-CM

## 2022-03-22 DIAGNOSIS — H9201 Otalgia, right ear: Secondary | ICD-10-CM

## 2022-03-22 MED ORDER — AMOXICILLIN-POT CLAVULANATE 875-125 MG PO TABS: 1.0000 | ORAL_TABLET | Freq: Two times a day (BID) | ORAL | 0 refills | Status: DC

## 2022-03-22 NOTE — Patient Instructions (Signed)
Bilateral ear infections Begin Augmentin antibiotic twice daily for 10 days Begin over the counter Flonase nasal spray twice daily for a week Begin mucinex samples, 1 tablet twice daily until you finish them Increase water intake You can use over the counter tylenol for pain Recheck next week if not improving If you feel better next week, then plan to recheck ear in 2-3 weeks to make sure this is back to normal

## 2022-03-22 NOTE — Progress Notes (Signed)
Subjective:  Scott Moss is a 46 y.o. male who presents for Chief Complaint  Patient presents with   right ear pain    Right ear pain, x 1 month. No wax in there but yellow will start drainage out of it     Here for right ear pain x1 month.  If he lays on his right side he gets a little bit of seepage.  He has been having ongoing pain and decreased urine.  No fever no body aches or chills.  No cough, no runny nose or sneezing, no other respiratory complaints. No other aggravating or relieving factors.    No other c/o.  Past Medical History:  Diagnosis Date   Arthritis    Atypical chest pain 07/06/2015   Non-obstructive CAD on [facility] 04/2010.   Chondromalacia of left knee 0/9233   Complication of anesthesia    Family history of cancer    multiple family members, multiple types.   Sees Dr. Marin Olp   Family history of cancer    Family history of premature CAD    GERD (gastroesophageal reflux disease)    H/O cardiovascular stress test 07/14/2015   myocardial perfusion scan, normal, EF 45-54% mildy reduced LV function, Dr. Oval Linsey   H/O echocardiogram 07/18/2015   LV mild hypertrophy, 55-60%EF, Dr. Oval Linsey   High cholesterol    Hypertension    Obesity    Overweight    PONV (postoperative nausea and vomiting)    Testicular cancer ([place]) 02/2005   Dr. Marin Olp   Current Outpatient Medications on File Prior to Visit  Medication Sig Dispense Refill   acetaminophen (TYLENOL) 500 MG tablet Take 500 mg by mouth every 6 (six) hours as needed for mild pain.     aspirin (ASPIRIN LOW DOSE) 81 MG EC tablet TAKE 1 TABLET(81 MG) BY MOUTH DAILY. SWALLOW WHOLE (Patient taking differently: Take 81 mg by mouth daily. TAKE 1 TABLET(81 MG) BY MOUTH DAILY. SWALLOW WHOLE) 90 tablet 3   famotidine (PEPCID) 20 MG tablet Take 1 tablet (20 mg total) by mouth daily. 180 tablet 1   fexofenadine (ALLEGRA) 180 MG tablet Take 180 mg by mouth daily.     naproxen (NAPROSYN) 500 MG tablet Take 1 tablet (500 mg  total) by mouth 2 (two) times daily with a meal. 30 tablet 0   oxybutynin (DITROPAN) 5 MG tablet Take 1 tablet (5 mg total) by mouth 2 (two) times daily. 180 tablet 3   rosuvastatin (CRESTOR) 10 MG tablet TAKE 1 TABLET(10 MG) BY MOUTH DAILY (Patient taking differently: Take 10 mg by mouth daily. TAKE 1 TABLET(10 MG) BY MOUTH DAILY) 90 tablet 3   valsartan-hydrochlorothiazide (DIOVAN HCT) 160-12.5 MG tablet Take 1 tablet by mouth daily. 30 tablet 11   No current facility-administered medications on file prior to visit.     The following portions of the patient's history were reviewed and updated as appropriate: allergies, current medications, past family history, past medical history, past social history, past surgical history and problem list.  ROS Otherwise as in subjective above  Objective: BP 122/84   Pulse 71   Temp 98.7 F (37.1 C)   Wt 241 lb 3.2 oz (109.4 kg)   BMI 33.64 kg/m   General appearance: alert, no distress, well developed, well nourished HEENT: normocephalic, sclerae anicteric, conjunctiva pink and moist, bilateral TMs with erythema and retraction, effusion behind both eardrums, otherwise normal ears, nares patent, no discharge or erythema, pharynx normal Oral cavity: MMM, no lesions Neck: supple, no  lymphadenopathy, no thyromegaly, no masses   Assessment: Encounter Diagnoses  Name Primary?   Acute suppurative otitis media of both ears without spontaneous rupture of tympanic membranes, recurrence not specified Yes   Ear pain, right    Acute bronchitis, unspecified organism      Plan: Significant ear infection bilaterally.  Begin medication below.  Begin Mucinex samples and Flonase over-the-counter.  Hydrate well.  Recheck in 2 weeks or sooner as needed.  Possibly referral to ENT if not looking back to normal completely.  [name] was seen today for right ear pain.  Diagnoses and all orders for this visit:  Acute suppurative otitis media of both ears without  spontaneous rupture of tympanic membranes, recurrence not specified  Ear pain, right  Acute bronchitis, unspecified organism Comments: error Orders: -     amoxicillin-clavulanate (AUGMENTIN) 875-125 MG tablet; Take 1 tablet by mouth 2 (two) times daily.    Follow up: 2 weeks

## 2022-04-10 ENCOUNTER — Encounter: Payer: Self-pay | Admitting: Internal Medicine

## 2022-04-12 ENCOUNTER — Ambulatory Visit: Payer: BC Managed Care – PPO | Admitting: Medical

## 2022-04-12 VITALS — BP 120/70 | HR 88 | Wt 240.2 lb

## 2022-04-12 DIAGNOSIS — Z6833 Body mass index (BMI) 33.0-33.9, adult: Secondary | ICD-10-CM | POA: Diagnosis not present

## 2022-04-12 DIAGNOSIS — H6690 Otitis media, unspecified, unspecified ear: Secondary | ICD-10-CM

## 2022-04-12 DIAGNOSIS — H7393 Unspecified disorder of tympanic membrane, bilateral: Secondary | ICD-10-CM | POA: Diagnosis not present

## 2022-04-12 MED ORDER — CEFUROXIME AXETIL 500 MG PO TABS: 500.0000 mg | ORAL_TABLET | Freq: Two times a day (BID) | ORAL | 0 refills | Status: AC

## 2022-04-12 MED ORDER — PREDNISONE 10 MG PO TABS: ORAL_TABLET | ORAL | 0 refills | Status: DC

## 2022-04-12 MED ORDER — WEGOVY 0.25 MG/0.5ML ~~LOC~~ SOAJ: 0.2500 mg | SUBCUTANEOUS | 0 refills | Status: DC

## 2022-04-12 NOTE — Progress Notes (Signed)
Subjective:  Scott Moss is a 46 y.o. male who presents for Chief Complaint  Patient presents with   follow-up on ear    Follow-up on ears. Feeling better. Declines flu shot and covid     Here for recheck on ears.  I saw him recently for bilat ear infection.  He has completed Augment and feels better.  Here to recheck on abnormal appearing ears.  No more ear drainage since last visit.  No current respiratory c/o  Has cut out bread, has cut back on tea and beer.  Is walking, exercising, trying to eat healthy and not having any luck losing weight.  He and wife walk every eve ing for 30+ minutes at the track.  They don't eat after 6:30pm.   No prior weight loss medications.   Doesn't normally eat breakfast.    No other aggravating or relieving factors.    No other c/o.  Past Medical History:  Diagnosis Date   Arthritis    Atypical chest pain 07/06/2015   Non-obstructive CAD on [facility] 04/2010.   Chondromalacia of left knee 08/6107   Complication of anesthesia    Family history of cancer    multiple family members, multiple types.   Sees Dr. Marin Olp   Family history of cancer    Family history of premature CAD    GERD (gastroesophageal reflux disease)    H/O cardiovascular stress test 07/14/2015   myocardial perfusion scan, normal, EF 45-54% mildy reduced LV function, Dr. Oval Linsey   H/O echocardiogram 07/18/2015   LV mild hypertrophy, 55-60%EF, Dr. Oval Linsey   High cholesterol    Hypertension    Obesity    Overweight    PONV (postoperative nausea and vomiting)    Testicular cancer ([place]) 02/2005   Dr. Marin Olp   Current Outpatient Medications on File Prior to Visit  Medication Sig Dispense Refill   acetaminophen (TYLENOL) 500 MG tablet Take 500 mg by mouth every 6 (six) hours as needed for mild pain.     aspirin (ASPIRIN LOW DOSE) 81 MG EC tablet TAKE 1 TABLET(81 MG) BY MOUTH DAILY. SWALLOW WHOLE (Patient taking differently: Take 81 mg by mouth daily. TAKE 1 TABLET(81 MG) BY MOUTH  DAILY. SWALLOW WHOLE) 90 tablet 3   famotidine (PEPCID) 20 MG tablet Take 1 tablet (20 mg total) by mouth daily. 180 tablet 1   fexofenadine (ALLEGRA) 180 MG tablet Take 180 mg by mouth daily.     naproxen (NAPROSYN) 500 MG tablet Take 1 tablet (500 mg total) by mouth 2 (two) times daily with a meal. 30 tablet 0   oxybutynin (DITROPAN) 5 MG tablet Take 1 tablet (5 mg total) by mouth 2 (two) times daily. 180 tablet 3   rosuvastatin (CRESTOR) 10 MG tablet TAKE 1 TABLET(10 MG) BY MOUTH DAILY (Patient taking differently: Take 10 mg by mouth daily. TAKE 1 TABLET(10 MG) BY MOUTH DAILY) 90 tablet 3   valsartan-hydrochlorothiazide (DIOVAN HCT) 160-12.5 MG tablet Take 1 tablet by mouth daily. 30 tablet 11   No current facility-administered medications on file prior to visit.     The following portions of the patient's history were reviewed and updated as appropriate: allergies, current medications, past family history, past medical history, past social history, past surgical history and problem list.  ROS Otherwise as in subjective above  Objective: BP 120/70   Pulse 88   Wt 240 lb 3.2 oz (109 kg)   BMI 33.50 kg/m   Wt Readings from Last 3 Encounters:  04/12/22 240 lb 3.2 oz (109 kg)  03/22/22 241 lb 3.2 oz (109.4 kg)  12/29/21 230 lb (104.3 kg)   General appearance: alert, no distress, well developed, well nourished Heart rrr, normal s1, s2, on murmurs Lungs clear Ext: no edema HENT unremarkable except for abnormal appearing bilat TMs with mild erythema right TM. Psych pleasant, good eye contact, answers question appropriately    Assessment: Encounter Diagnoses  Name Primary?   Tympanic membrane disorder, bilateral Yes   Chronic otitis media, unspecified otitis media type    BMI 33.0-33.9,adult       Plan: Recent ear infection much improved but the ear drum still look abnormal.  Referral to ENT.  If any new symptoms before he sees ENT he will use a round of Ceftin and  prednisone as below.  Obesity-we discussed diet, exercise, efforts to lose weight.  Begin trial of Wegovy.  Discussed risk and benefits and proper use of medication.  Instruction given on medication.  Follow-up in 6 to 8 weeks   [name] was seen today for follow-up on ear.  Diagnoses and all orders for this visit:  Tympanic membrane disorder, bilateral -     Ambulatory referral to ENT  Chronic otitis media, unspecified otitis media type -     Ambulatory referral to ENT  BMI 33.0-33.9,adult  Other orders -     cefUROXime (CEFTIN) 500 MG tablet; Take 1 tablet (500 mg total) by mouth 2 (two) times daily with a meal for 10 days. -     predniSONE (DELTASONE) 10 MG tablet; 6 tablets all together day 1, 5 tablets day 2, 4 tablets day 3, 3 tablets day 4, 2 tablets day 5, 1 tablet day 6. -     Semaglutide-Weight Management (WEGOVY) 0.25 MG/0.5ML SOAJ; Inject 0.25 mg into the skin once a week.     Follow up: 6 wk

## 2022-04-24 ENCOUNTER — Telehealth: Payer: Self-pay | Admitting: Internal Medicine

## 2022-04-24 NOTE — Telephone Encounter (Signed)
Pt was calling to find out the status of the PA for his wegovy. Pt has not heard from anyone about this

## 2022-04-25 NOTE — Telephone Encounter (Signed)
I haven't received any notice of P.A. needed.  I called pharmacy and they ran it and needs P.A.  asked them to fax rejection.

## 2022-05-04 ENCOUNTER — Telehealth: Payer: Self-pay | Admitting: Medical

## 2022-05-04 NOTE — Telephone Encounter (Signed)
Wegovy approved til 08/28/22, sent mychart message

## 2022-05-04 NOTE — Telephone Encounter (Signed)
P,A, WEGOVY completed

## 2022-05-04 NOTE — Telephone Encounter (Signed)
P,A, Scott Moss

## 2022-05-14 ENCOUNTER — Encounter: Payer: Self-pay | Admitting: Internal Medicine

## 2022-05-21 ENCOUNTER — Telehealth: Payer: Self-pay | Admitting: Licensed Clinical Social Worker

## 2022-05-21 NOTE — Patient Outreach (Signed)
  Care Coordination   Initial Visit Note   05/21/2022 Name: Scott Moss MRN: [id number] DOB: 08-13-1975  Scott Moss is a 46 y.o. year old male who sees Tysinger, Camelia Eng, PA-C for primary care. I spoke with  Scott Moss by phone today.  What matters to the patients health and wellness today?  Care Coordination    Goals Addressed             This Visit's Progress    COMPLETED: Care Coordination Activities-No Follow Up Required       Care Coordination Interventions: Active listening / Reflection utilized  LCSW informed patient of care coordination services. Pt is not interested at this time and agreed to contact PCP, should needs arise  LCSW reviewed upcoming appts LCSW informed pt of vaccine availability at PCP office. Pt is not interested in flu vaccine          SDOH assessments and interventions completed:  No     Care Coordination Interventions Activated:  Yes  Care Coordination Interventions:  Yes, provided   Follow up plan: No further intervention required.   Encounter Outcome:  Pt. Refused   Christa See, MSW, [email or web address] Phone [phone] 10:11 AM

## 2022-05-21 NOTE — Patient Instructions (Signed)
Visit Information  Thank you for taking time to visit with me today. Please don't hesitate to contact me if I can be of assistance to you.   Following are the goals we discussed today:   Goals Addressed             This Visit's Progress    COMPLETED: Care Coordination Activities-No Follow Up Required       Care Coordination Interventions: Active listening / Reflection utilized  LCSW informed patient of care coordination services. Pt is not interested at this time and agreed to contact PCP, should needs arise  LCSW reviewed upcoming appts LCSW informed pt of vaccine availability at PCP office. Pt is not interested in flu vaccine         If you are experiencing a Mental Health or [place] or need someone to talk to, please call the Suicide and Crisis Lifeline: 988 call 911   Patient verbalizes understanding of instructions and care plan provided today and agrees to view in [place]. Active MyChart status and patient understanding of how to access instructions and care plan via MyChart confirmed with patient.     No further follow up required:    Christa See, MSW, [email or web address] Phone [phone] 10:12 AM

## 2022-07-24 ENCOUNTER — Telehealth: Payer: BC Managed Care – PPO | Admitting: Medical

## 2022-07-24 ENCOUNTER — Ambulatory Visit (INDEPENDENT_AMBULATORY_CARE_PROVIDER_SITE_OTHER): Payer: BC Managed Care – PPO

## 2022-07-24 ENCOUNTER — Ambulatory Visit
Admission: EM | Admit: 2022-07-24 | Discharge: 2022-07-24 | Disposition: A | Discharge status: Home / Self Care | Payer: BC Managed Care – PPO | Attending: Family Medicine | Admitting: Family Medicine

## 2022-07-24 ENCOUNTER — Encounter: Payer: Self-pay | Admitting: Medical

## 2022-07-24 VITALS — Ht 69.0 in | Wt 224.0 lb

## 2022-07-24 DIAGNOSIS — R051 Acute cough: Secondary | ICD-10-CM | POA: Diagnosis not present

## 2022-07-24 DIAGNOSIS — R058 Other specified cough: Secondary | ICD-10-CM

## 2022-07-24 DIAGNOSIS — J22 Unspecified acute lower respiratory infection: Secondary | ICD-10-CM

## 2022-07-24 DIAGNOSIS — J988 Other specified respiratory disorders: Secondary | ICD-10-CM | POA: Diagnosis not present

## 2022-07-24 DIAGNOSIS — R059 Cough, unspecified: Secondary | ICD-10-CM | POA: Diagnosis not present

## 2022-07-24 DIAGNOSIS — R0989 Other specified symptoms and signs involving the circulatory and respiratory systems: Secondary | ICD-10-CM

## 2022-07-24 MED ORDER — HYDROCODONE BIT-HOMATROP MBR 5-1.5 MG/5ML PO SOLN: 5.0000 mL | Freq: Three times a day (TID) | ORAL | 0 refills | Status: AC | PRN

## 2022-07-24 MED ORDER — CEFUROXIME AXETIL 500 MG PO TABS: 500.0000 mg | ORAL_TABLET | Freq: Two times a day (BID) | ORAL | 0 refills | Status: DC

## 2022-07-24 NOTE — Progress Notes (Signed)
Subjective:     Patient ID: Scott Moss, male   DOB: 22-Aug-1975, 46 y.o.   MRN: [id number]  This visit type was conducted due to national recommendations for restrictions regarding the COVID-19 Pandemic (e.g. social distancing) in an effort to limit this patient's exposure and mitigate transmission in our community.  Due to their co-morbid illnesses, this patient is at least at moderate risk for complications without adequate follow up.  This format is felt to be most appropriate for this patient at this time.    Documentation for virtual audio and video telecommunications through [place] encounter:  The patient was located at home. The provider was located in the office. The patient did consent to this visit and is aware of possible charges through their insurance for this visit.  The other persons participating in this telemedicine service were none. Time spent on call was 20 minutes and in review of previous records 20 minutes total.  This virtual service is not related to other E/M service within previous 7 days.   HPI Chief Complaint  Patient presents with   OTHER    Cold symptoms for two weeks, cough, chest congestion, headache, no fever, can't stop coughing, brown mucous,    Virtual for illness.  He notes 2 weeks, cough, chest congestion, headache, has some sinus pressure, head is pounding from the coughing.  No NVD.  Feels tight in chest but no wheezing.   No sore throat, no ear pain. Wife not feeling well, but not like his illness.  No other sick contacts.  Using Nyquil, dayquil.  Using some coricidin HBP as well.  Had negative covid test last week and today.  No other aggravating or relieving factors. No other complaint.   Past Medical History:  Diagnosis Date   Arthritis    Atypical chest pain 07/06/2015   Non-obstructive CAD on [facility] 04/2010.   Chondromalacia of left knee 04/3266   Complication of anesthesia    Family history of cancer    multiple family members,  multiple types.   Sees Dr. Marin Olp   Family history of cancer    Family history of premature CAD    GERD (gastroesophageal reflux disease)    H/O cardiovascular stress test 07/14/2015   myocardial perfusion scan, normal, EF 45-54% mildy reduced LV function, Dr. Oval Linsey   H/O echocardiogram 07/18/2015   LV mild hypertrophy, 55-60%EF, Dr. Oval Linsey   High cholesterol    Hypertension    Obesity    Overweight    PONV (postoperative nausea and vomiting)    Testicular cancer ([place]) 02/2005   Dr. Marin Olp   Current Outpatient Medications on File Prior to Visit  Medication Sig Dispense Refill   aspirin (ASPIRIN LOW DOSE) 81 MG EC tablet TAKE 1 TABLET(81 MG) BY MOUTH DAILY. SWALLOW WHOLE (Patient taking differently: Take 81 mg by mouth daily. TAKE 1 TABLET(81 MG) BY MOUTH DAILY. SWALLOW WHOLE) 90 tablet 3   famotidine (PEPCID) 20 MG tablet Take 1 tablet (20 mg total) by mouth daily. 180 tablet 1   oxybutynin (DITROPAN) 5 MG tablet Take 1 tablet (5 mg total) by mouth 2 (two) times daily. 180 tablet 3   rosuvastatin (CRESTOR) 10 MG tablet TAKE 1 TABLET(10 MG) BY MOUTH DAILY (Patient taking differently: Take 10 mg by mouth daily. TAKE 1 TABLET(10 MG) BY MOUTH DAILY) 90 tablet 3   valsartan-hydrochlorothiazide (DIOVAN HCT) 160-12.5 MG tablet Take 1 tablet by mouth daily. 30 tablet 11   acetaminophen (TYLENOL) 500 MG tablet Take 500  mg by mouth every 6 (six) hours as needed for mild pain. (Patient not taking: Reported on 07/24/2022)     fexofenadine (ALLEGRA) 180 MG tablet Take 180 mg by mouth daily. (Patient not taking: Reported on 07/24/2022)     naproxen (NAPROSYN) 500 MG tablet Take 1 tablet (500 mg total) by mouth 2 (two) times daily with a meal. (Patient not taking: Reported on 07/24/2022) 30 tablet 0   predniSONE (DELTASONE) 10 MG tablet 6 tablets all together day 1, 5 tablets day 2, 4 tablets day 3, 3 tablets day 4, 2 tablets day 5, 1 tablet day 6. (Patient not taking: Reported on 07/24/2022) 21  tablet 0   Semaglutide-Weight Management (WEGOVY) 0.25 MG/0.5ML SOAJ Inject 0.25 mg into the skin once a week. (Patient not taking: Reported on 07/24/2022) 2 mL 0   No current facility-administered medications on file prior to visit.    Review of Systems As in subjective    Objective:   Physical Exam Due to coronavirus pandemic stay at home measures, patient visit was virtual and they were not examined in person.   Ht '5\' 9"'$  (1.753 m)   Wt 224 lb (101.6 kg)   BMI 33.08 kg/m   Gen: wd, wn ,nad Coughing quite a bit, somewhat ill appearing      Assessment:     Encounter Diagnoses  Name Primary?   Acute cough Yes   Respiratory tract infection    Cough productive of purulent sputum        Plan:     Discussed symptoms and concerns.  Begin medications below, can continue Coricidin during day, rest ,hydrate.  Go for chest xray.  If not seeing considerable improvement in the next 72 hours, call back.  On next visit here plan pneumococcal vaccine, PFT given prior respiratory infections in the past year.   Of note, lifetime nonsmoker.  Scott Moss was seen today for other.  Diagnoses and all orders for this visit:  Acute cough -     DG Chest 2 View; Future  Respiratory tract infection  Cough productive of purulent sputum  Other orders -     HYDROcodone bit-homatropine (HYCODAN) 5-1.5 MG/5ML syrup; Take 5 mLs by mouth every 8 (eight) hours as needed for up to 5 days for cough. -     cefUROXime (CEFTIN) 500 MG tablet; Take 1 tablet (500 mg total) by mouth 2 (two) times daily with a meal for 10 days.    F/u soon in person

## 2022-07-24 NOTE — ED Provider Notes (Signed)
[facility]    CSN: [id number] Arrival date & time: 07/24/22  1621      History   Chief Complaint No chief complaint on file.   HPI Scott Moss is a 46 y.o. male.   Patient presenting today with 2-week history of headache, chest soreness due to coughing, productive cough of brown mucus, shortness of breath at times.  Denies known fever, chills, abdominal pain, nausea vomiting or diarrhea.  States he had a virtual visit with his PCP today who prescribed him Ceftin but wanted him to come here and get a chest x-ray performed.  No known history of chronic pulmonary disease.    Past Medical History:  Diagnosis Date   Arthritis    Atypical chest pain 07/06/2015   Non-obstructive CAD on [facility] 04/2010.   Chondromalacia of left knee 08/7492   Complication of anesthesia    Family history of cancer    multiple family members, multiple types.   Sees Dr. Marin Olp   Family history of cancer    Family history of premature CAD    GERD (gastroesophageal reflux disease)    H/O cardiovascular stress test 07/14/2015   myocardial perfusion scan, normal, EF 45-54% mildy reduced LV function, Dr. Oval Linsey   H/O echocardiogram 07/18/2015   LV mild hypertrophy, 55-60%EF, Dr. Oval Linsey   High cholesterol    Hypertension    Obesity    Overweight    PONV (postoperative nausea and vomiting)    Testicular cancer ([place]) 02/2005   Dr. Marin Olp    Patient Active Problem List   Diagnosis Date Noted   Ear pain, right 03/22/2022   Acute suppurative otitis media of both ears without spontaneous rupture of tympanic membranes 03/22/2022   Mucoid otitis media of both ears 08/14/2021   Respiratory tract infection 08/14/2021   Screening for diabetes mellitus 08/14/2021   Screening for prostate cancer 08/14/2021   Impaired fasting blood sugar 08/14/2021   DOE (dyspnea on exertion) 06/08/2021   Allergic rhinitis 11/07/2020   History of COVID-19 11/07/2020   Cough 08/22/2020   History of  prostatitis 12/16/2019   Deformity of hip joint 10/16/2018   Fatigue 06/11/2017   Hyperlipidemia 01/29/2017   Fatty liver 01/29/2017   Encounter for health maintenance examination in adult 04/03/2016   Family history of premature CAD 04/03/2016   Family history of cancer 04/03/2016   Obesity 04/03/2016   Hyperhidrosis 04/03/2016   Influenza vaccination declined 04/03/2016   Abnormal hearing screen 04/03/2016   H/O testicular cancer 12/02/2011   Essential hypertension     Past Surgical History:  Procedure Laterality Date   ANTERIOR CERVICAL DECOMP/DISCECTOMY FUSION  11/11/2008   C5-6   APPENDECTOMY     CARDIAC CATHETERIZATION  04/13/2010   no disease   CERVICAL SPINE SURGERY  08/05/2013   Dr. Deri Fuelling   COLONOSCOPY     prior, no problems per patient, year unknown, possibly in early 23s.   HIP ARTHROPLASTY Left 03/2020   HIP ARTHROPLASTY Right 04/2020   KNEE ARTHROSCOPY Right    KNEE ARTHROSCOPY Left 10/19/2015   Procedure: LEFT KNEE SCOPE CHONDROPLASTY;  Surgeon: [name], MD;  Location: [facility];  Service: Orthopedics;  Laterality: Left;   KNEE ARTHROSCOPY WITH LATERAL RELEASE Left 09/18/2012   Procedure: KNEE ARTHROSCOPY WITH LATERAL RELEASE;  Surgeon: [name], MD;  Location: [facility];  Service: Orthopedics;  Laterality: Left;  WITH DEBRIDEMENT AND SHAVING(CHONDROPLASTY), Excision Medial [place]   NASAL SINUS SURGERY  08/05/2012   PARTIAL KNEE ARTHROPLASTY Left 08/15/2016   Procedure: PATELLA FEMORAL REPLACEMENT LEFT KNEE;  Surgeon: [name], MD;  Location: [facility];  Service: Orthopedics;  Laterality: Left;   RADICAL ORCHIECTOMY  02/07/2005   left; with left testicular implant       Home Medications    Prior to Admission medications   Medication Sig Start Date End Date Taking? Authorizing Provider  acetaminophen (TYLENOL) 500 MG tablet Take 500 mg by mouth every 6 (six) hours as needed for mild  pain. Patient not taking: Reported on 07/24/2022    [provider]  aspirin (ASPIRIN LOW DOSE) 81 MG EC tablet TAKE 1 TABLET(81 MG) BY MOUTH DAILY. SWALLOW WHOLE Patient taking differently: Take 81 mg by mouth daily. TAKE 1 TABLET(81 MG) BY MOUTH DAILY. SWALLOW WHOLE 08/14/21   Tysinger, Camelia Eng, PA-C  cefUROXime (CEFTIN) 500 MG tablet Take 1 tablet (500 mg total) by mouth 2 (two) times daily with a meal for 10 days. 07/24/22 08/03/22  Tysinger, Camelia Eng, PA-C  famotidine (PEPCID) 20 MG tablet Take 1 tablet (20 mg total) by mouth daily. 08/14/21   Tysinger, Camelia Eng, PA-C  fexofenadine (ALLEGRA) 180 MG tablet Take 180 mg by mouth daily. Patient not taking: Reported on 07/24/2022    [provider]  HYDROcodone bit-homatropine (HYCODAN) 5-1.5 MG/5ML syrup Take 5 mLs by mouth every 8 (eight) hours as needed for up to 5 days for cough. 07/24/22 07/29/22  Tysinger, Camelia Eng, PA-C  naproxen (NAPROSYN) 500 MG tablet Take 1 tablet (500 mg total) by mouth 2 (two) times daily with a meal. Patient not taking: Reported on 07/24/2022 12/29/21   Noemi Chapel, MD  oxybutynin (DITROPAN) 5 MG tablet Take 1 tablet (5 mg total) by mouth 2 (two) times daily. 08/15/21   Tysinger, Camelia Eng, PA-C  predniSONE (DELTASONE) 10 MG tablet 6 tablets all together day 1, 5 tablets day 2, 4 tablets day 3, 3 tablets day 4, 2 tablets day 5, 1 tablet day 6. Patient not taking: Reported on 07/24/2022 04/12/22   Tysinger, Camelia Eng, PA-C  rosuvastatin (CRESTOR) 10 MG tablet TAKE 1 TABLET(10 MG) BY MOUTH DAILY Patient taking differently: Take 10 mg by mouth daily. TAKE 1 TABLET(10 MG) BY MOUTH DAILY 08/14/21   Tysinger, Camelia Eng, PA-C  Semaglutide-Weight Management (WEGOVY) 0.25 MG/0.5ML SOAJ Inject 0.25 mg into the skin once a week. Patient not taking: Reported on 07/24/2022 04/12/22   Tysinger, Camelia Eng, PA-C  valsartan-hydrochlorothiazide (DIOVAN HCT) 160-12.5 MG tablet Take 1 tablet by mouth daily. 09/28/21   Tanda Rockers, MD     Family History Family History  Problem Relation Age of Onset   Ovarian cancer Mother        uterine, mets   Hypertension Mother    Stroke Mother    Migraines Mother    Cancer Mother    Lung cancer Father    Hypertension Father    Heart attack Father 8   Skin cancer Father        ?   Heart disease Father    Cancer Father        lung   Cancer Sister 75       multiple myeloma   Hypertension Sister    Migraines Sister    Diabetes Maternal Uncle    Colon cancer Maternal Uncle    Esophageal cancer Maternal Uncle    Heart attack Maternal Uncle    Heart attack Paternal Uncle  Stomach cancer Neg Hx    Colon polyps Neg Hx    Rectal cancer Neg Hx     Social History Social History   Tobacco Use   Smoking status: Never    Passive exposure: Never   Smokeless tobacco: Never   Tobacco comments:    never used tobacco  Vaping Use   Vaping Use: Never used  Substance Use Topics   Alcohol use: Yes    Alcohol/week: 3.0 standard drinks of alcohol    Types: 3 Cans of beer per week    Comment: social   Drug use: No     Allergies   Benicar [olmesartan] and Nexium [esomeprazole magnesium]   Review of Systems Review of Systems Per HPI  Physical Exam Triage Vital Signs ED Triage Vitals  Enc Vitals Group     BP 07/24/22 1809 124/82     Pulse Rate 07/24/22 1809 77     Resp 07/24/22 1809 18     Temp 07/24/22 1809 97.6 F (36.4 C)     Temp Source 07/24/22 1809 Oral     SpO2 07/24/22 1809 96 %     Weight --      Height --      Head Circumference --      Peak Flow --      Pain Score 07/24/22 1812 0     Pain Loc --      Pain Edu? --      Excl. in [place]? --    No data found.  Updated Vital Signs BP 124/82 (BP Location: Right Arm)   Pulse 77   Temp 97.6 F (36.4 C) (Oral)   Resp 18   SpO2 96%   Visual Acuity Right Eye Distance:   Left Eye Distance:   Bilateral Distance:    Right Eye Near:   Left Eye Near:    Bilateral Near:     Physical Exam Vitals  and nursing note reviewed.  Constitutional:      Appearance: He is well-developed.  HENT:     Head: Atraumatic.     Right Ear: External ear normal.     Left Ear: External ear normal.     Nose: Congestion present.     Mouth/Throat:     Pharynx: Posterior oropharyngeal erythema present. No oropharyngeal exudate.  Eyes:     Conjunctiva/sclera: Conjunctivae normal.     Pupils: Pupils are equal, round, and reactive to light.  Cardiovascular:     Rate and Rhythm: Normal rate and regular rhythm.  Pulmonary:     Effort: Pulmonary effort is normal. No respiratory distress.     Breath sounds: No wheezing or rales.  Musculoskeletal:        General: Normal range of motion.     Cervical back: Normal range of motion and neck supple.  Lymphadenopathy:     Cervical: No cervical adenopathy.  Skin:    General: Skin is warm and dry.  Neurological:     Mental Status: He is alert and oriented to person, place, and time.  Psychiatric:        Behavior: Behavior normal.      UC Treatments / Results  Labs (all labs ordered are listed, but only abnormal results are displayed) Labs Reviewed - No data to display  EKG   Radiology DG Chest 2 View  Result Date: 07/24/2022 CLINICAL DATA:  Productive cough and congestion EXAM: CHEST - 2 VIEW COMPARISON:  Chest x-ray 08/14/2021 FINDINGS: The heart size and mediastinal  contours are within normal limits. Both lungs are clear. The no acute fractures are seen. Cervical spinal fusion plate is present. IMPRESSION: No active cardiopulmonary disease. Electronically Signed   By: Ronney Asters M.D.   On: 07/24/2022 19:07    Procedures Procedures (including critical care time)  Medications Ordered in UC Medications - No data to display  Initial Impression / Assessment and Plan / UC Course  I have reviewed the triage vital signs and the nursing notes.  Pertinent labs & imaging results that were available during my care of the patient were reviewed by me  and considered in my medical decision making (see chart for details).     Vitals and exam overall very reassuring today, chest x-ray without evidence of acute cardiopulmonary abnormality.  He has already been sent an antibiotic and cough suppressant, discussed to proceed per PCP recommendations and follow-up if worsening or not improving.  Final Clinical Impressions(s) / UC Diagnoses   Final diagnoses:  Lower respiratory infection   Discharge Instructions   None    ED Prescriptions   None    PDMP not reviewed this encounter.   Volney American, [place] 07/24/22 1943

## 2022-07-24 NOTE — ED Triage Notes (Signed)
Pt reports he has ben feeling bad x 2 weeks. Severe headache, chest pains due to coughing, coughing up brown mucus. Pt states doctor wants him to have a chest x ray to exclude pneumonia. Took nyquil, Oscillococcinum, elderberry tabs,  dayquil but no relief.  Hasn't picked up med that doc gave him today

## 2022-08-05 ENCOUNTER — Other Ambulatory Visit: Payer: Self-pay | Admitting: Medical

## 2022-08-05 DIAGNOSIS — R112 Nausea with vomiting, unspecified: Secondary | ICD-10-CM

## 2022-08-08 ENCOUNTER — Other Ambulatory Visit: Payer: Self-pay | Admitting: Medical

## 2022-08-09 ENCOUNTER — Telehealth: Payer: Self-pay | Admitting: Medical

## 2022-08-09 MED ORDER — VALSARTAN-HYDROCHLOROTHIAZIDE 160-12.5 MG PO TABS
1.0000 | ORAL_TABLET | Freq: Every day | ORAL | 3 refills | Status: DC
Start: 2022-08-09 — End: 2022-08-16

## 2022-08-09 NOTE — Telephone Encounter (Signed)
Ferdinand called and stated the new bp medicine he was put on (he wasn't sure what it was called) needs to be sent in for 30 days because his insurance wont cover 90 days.

## 2022-08-09 NOTE — Telephone Encounter (Signed)
Refilled the valsartan-hctz for 30 days

## 2022-08-15 ENCOUNTER — Encounter: Payer: BC Managed Care – PPO | Admitting: Medical

## 2022-08-16 ENCOUNTER — Ambulatory Visit: Payer: BC Managed Care – PPO | Admitting: Medical

## 2022-08-16 ENCOUNTER — Encounter: Payer: Self-pay | Admitting: Medical

## 2022-08-16 ENCOUNTER — Other Ambulatory Visit: Payer: Self-pay | Admitting: *Deleted

## 2022-08-16 VITALS — BP 130/86 | HR 80 | Ht 69.0 in | Wt 236.0 lb

## 2022-08-16 DIAGNOSIS — Z125 Encounter for screening for malignant neoplasm of prostate: Secondary | ICD-10-CM

## 2022-08-16 DIAGNOSIS — Z131 Encounter for screening for diabetes mellitus: Secondary | ICD-10-CM | POA: Diagnosis not present

## 2022-08-16 DIAGNOSIS — Z0000 Encounter for general adult medical examination without abnormal findings: Secondary | ICD-10-CM | POA: Diagnosis not present

## 2022-08-16 DIAGNOSIS — R61 Generalized hyperhidrosis: Secondary | ICD-10-CM

## 2022-08-16 DIAGNOSIS — R5383 Other fatigue: Secondary | ICD-10-CM | POA: Diagnosis not present

## 2022-08-16 DIAGNOSIS — R7301 Impaired fasting glucose: Secondary | ICD-10-CM | POA: Diagnosis not present

## 2022-08-16 DIAGNOSIS — Z23 Encounter for immunization: Secondary | ICD-10-CM | POA: Diagnosis not present

## 2022-08-16 DIAGNOSIS — Z8547 Personal history of malignant neoplasm of testis: Secondary | ICD-10-CM

## 2022-08-16 DIAGNOSIS — M791 Myalgia, unspecified site: Secondary | ICD-10-CM

## 2022-08-16 DIAGNOSIS — E785 Hyperlipidemia, unspecified: Secondary | ICD-10-CM

## 2022-08-16 DIAGNOSIS — I10 Essential (primary) hypertension: Secondary | ICD-10-CM

## 2022-08-16 DIAGNOSIS — R112 Nausea with vomiting, unspecified: Secondary | ICD-10-CM

## 2022-08-16 LAB — LIPID PANEL

## 2022-08-16 LAB — POCT URINALYSIS DIP (PROADVANTAGE DEVICE)
Bilirubin, UA: NEGATIVE
Blood, UA: NEGATIVE
Glucose, UA: NEGATIVE mg/dL
Ketones, POC UA: NEGATIVE mg/dL
Leukocytes, UA: NEGATIVE
Nitrite, UA: NEGATIVE
Protein Ur, POC: NEGATIVE mg/dL
Specific Gravity, Urine: 1.015
Urobilinogen, Ur: 0.2
pH, UA: 6 (ref 5.0–8.0)

## 2022-08-16 MED ORDER — WEGOVY 0.25 MG/0.5ML ~~LOC~~ SOAJ: 0.2500 mg | SUBCUTANEOUS | 0 refills | Status: DC

## 2022-08-16 MED ORDER — FAMOTIDINE 20 MG PO TABS: 20.0000 mg | ORAL_TABLET | Freq: Every day | ORAL | 3 refills | Status: DC

## 2022-08-16 MED ORDER — VALSARTAN-HYDROCHLOROTHIAZIDE 160-12.5 MG PO TABS: 1.0000 | ORAL_TABLET | Freq: Every day | ORAL | 3 refills | Status: DC

## 2022-08-16 MED ORDER — OXYBUTYNIN CHLORIDE 5 MG PO TABS: ORAL_TABLET | ORAL | 1 refills | Status: DC

## 2022-08-16 MED ORDER — ASPIRIN 81 MG PO TBEC: DELAYED_RELEASE_TABLET | ORAL | 3 refills | Status: DC

## 2022-08-16 NOTE — Progress Notes (Signed)
Subjective: Chief Complaint  Patient presents with   Annual Exam    Fasting annual exam. Hips and legs ache all the time, a soreness. He feels weak and does not have a lot of energy.     Medical team: Dentist [facility] doctor Ortho, Dr. Marchia Bond Dr. Skeet Latch, cardiology [facility] in [place], [place], Camelia Eng, PA-C here for primary care   Concerns: Bones in legs and muscles feel sore all the time.  Still feels fatigued a lot.    No prior sleep study.   Wife doesn't mention snoring or apnea.    Hyperlipidemia-compliant with medication without complaint  Hypertension-compliant with medication without complaint  Reviewed their medical, surgical, family, social, medication, and allergy history and updated chart as appropriate.  Past Medical History:  Diagnosis Date   Arthritis    Atypical chest pain 07/06/2015   Non-obstructive CAD on [facility] 04/2010.   Chondromalacia of left knee 03/1447   Complication of anesthesia    Family history of cancer    multiple family members, multiple types.   Sees Dr. Marin Olp   Family history of cancer    Family history of premature CAD    GERD (gastroesophageal reflux disease)    H/O cardiovascular stress test 07/14/2015   myocardial perfusion scan, normal, EF 45-54% mildy reduced LV function, Dr. Oval Linsey   H/O echocardiogram 07/18/2015   LV mild hypertrophy, 55-60%EF, Dr. Oval Linsey   High cholesterol    Hypertension    Obesity    Overweight    PONV (postoperative nausea and vomiting)    Testicular cancer ([place]) 02/2005   Dr. Marin Olp    Past Surgical History:  Procedure Laterality Date   ANTERIOR CERVICAL DECOMP/DISCECTOMY FUSION  11/11/2008   C5-6   APPENDECTOMY     CARDIAC CATHETERIZATION  04/13/2010   no disease   CERVICAL SPINE SURGERY  08/05/2013   Dr. Deri Fuelling   COLONOSCOPY     prior, no problems per patient, year unknown, possibly in early 47s.   HIP ARTHROPLASTY Left 03/2020   HIP ARTHROPLASTY Right  04/2020   KNEE ARTHROSCOPY Right    KNEE ARTHROSCOPY Left 10/19/2015   Procedure: LEFT KNEE SCOPE CHONDROPLASTY;  Surgeon: [name], MD;  Location: [place];  Service: Orthopedics;  Laterality: Left;   KNEE ARTHROSCOPY WITH LATERAL RELEASE Left 09/18/2012   Procedure: KNEE ARTHROSCOPY WITH LATERAL RELEASE;  Surgeon: [name], MD;  Location: [place];  Service: Orthopedics;  Laterality: Left;  WITH DEBRIDEMENT AND SHAVING(CHONDROPLASTY), Excision Medial Plica   NASAL SINUS SURGERY  08/05/2012   PARTIAL KNEE ARTHROPLASTY Left 08/15/2016   Procedure: PATELLA FEMORAL REPLACEMENT LEFT KNEE;  Surgeon: [name], MD;  Location: [place];  Service: Orthopedics;  Laterality: Left;   RADICAL ORCHIECTOMY  02/07/2005   left; with left testicular implant    Social History   Socioeconomic History   Marital status: Married    Spouse name: Not on file   Number of children: 2   Years of education: Not on file   Highest education level: Not on file  Occupational History   Occupation: truck driver  Tobacco Use   Smoking status: Never    Passive exposure: Never   Smokeless tobacco: Never   Tobacco comments:    never used tobacco  Vaping Use   Vaping Use: Never used  Substance and Sexual Activity   Alcohol use: Yes    Alcohol/week: 3.0 standard drinks of alcohol  Types: 3 Cans of beer per week    Comment: social   Drug use: No   Sexual activity: Yes  Other Topics Concern   Not on file  Social History Narrative   Married, 2 children, is a Administrator for [employer].   08/2022      Social Determinants of Health   Financial Resource Strain: Not on file  Food Insecurity: Not on file  Transportation Needs: Not on file  Physical Activity: Not on file  Stress: Not on file  Social Connections: Not on file  Intimate Partner Violence: Not on file    Family History  Problem Relation Age of Onset   Ovarian cancer Mother         uterine, mets   Hypertension Mother    Stroke Mother    Migraines Mother    Cancer Mother    Lung cancer Father    Hypertension Father    Heart attack Father 41   Skin cancer Father        ?   Heart disease Father    Cancer Father        lung   Cancer Sister 46       multiple myeloma   Hypertension Sister    Migraines Sister    Diabetes Maternal Uncle    Colon cancer Maternal Uncle    Esophageal cancer Maternal Uncle    Heart attack Maternal Uncle    Heart attack Paternal Uncle    Stomach cancer Neg Hx    Colon polyps Neg Hx    Rectal cancer Neg Hx      Current Outpatient Medications:    rosuvastatin (CRESTOR) 10 MG tablet, TAKE 1 TABLET(10 MG) BY MOUTH DAILY, Disp: 90 tablet, Rfl: 0   acetaminophen (TYLENOL) 500 MG tablet, Take 500 mg by mouth every 6 (six) hours as needed for mild pain. (Patient not taking: Reported on 07/24/2022), Disp: , Rfl:    aspirin EC (ASPIRIN LOW DOSE) 81 MG tablet, TAKE 1 TABLET(81 MG) BY MOUTH DAILY. SWALLOW WHOLE, Disp: 90 tablet, Rfl: 3   famotidine (PEPCID) 20 MG tablet, Take 1 tablet (20 mg total) by mouth daily., Disp: 180 tablet, Rfl: 3   fexofenadine (ALLEGRA) 180 MG tablet, Take 180 mg by mouth daily. (Patient not taking: Reported on 07/24/2022), Disp: , Rfl:    oxybutynin (DITROPAN) 5 MG tablet, TAKE 1 TABLET(5 MG) BY MOUTH TWICE DAILY, Disp: 180 tablet, Rfl: 1   Semaglutide-Weight Management (WEGOVY) 0.25 MG/0.5ML SOAJ, Inject 0.25 mg into the skin once a week., Disp: 2 mL, Rfl: 0   valsartan-hydrochlorothiazide (DIOVAN HCT) 160-12.5 MG tablet, Take 1 tablet by mouth daily., Disp: 90 tablet, Rfl: 3  Allergies  Allergen Reactions   Benicar [Olmesartan]     Rash, allergic reaction   Nexium [Esomeprazole Magnesium]     Rash, allergic reaction    Review of Systems  Constitutional:  Positive for malaise/fatigue. Negative for chills, fever and weight loss.  HENT:  Negative for congestion, ear pain, hearing loss, sore throat and  tinnitus.   Eyes:  Negative for blurred vision, pain and redness.  Respiratory:  Negative for cough, hemoptysis and shortness of breath.   Cardiovascular:  Negative for chest pain, palpitations, orthopnea, claudication and leg swelling.  Gastrointestinal:  Negative for abdominal pain, blood in stool, constipation, diarrhea, nausea and vomiting.  Genitourinary:  Negative for dysuria, flank pain, frequency, hematuria and urgency.  Musculoskeletal:  Positive for myalgias. Negative for falls and joint pain.  Skin:  Negative for itching and rash.  Neurological:  Negative for dizziness, tingling, speech change, weakness and headaches.  Endo/Heme/Allergies:  Negative for polydipsia. Does not bruise/bleed easily.  Psychiatric/Behavioral:  Negative for depression and memory loss. The patient is not nervous/anxious and does not have insomnia.         Objective:  BP 130/86   Pulse 80   Ht '5\' 9"'$  (1.753 m)   Wt 236 lb (107 kg)   BMI 34.85 kg/m   BP Readings from Last 3 Encounters:  08/16/22 130/86  07/24/22 124/82  04/12/22 120/70   Wt Readings from Last 3 Encounters:  08/16/22 236 lb (107 kg)  07/24/22 224 lb (101.6 kg)  04/12/22 240 lb 3.2 oz (109 kg)    General appearance: alert, no distress, WD/WN, Caucasian male Skin: Tattoos bilateral arms, sleeve type tattoo, no worrisome lesions otherwise HEENT: Normocephalic, PERRLA, EOMI, conjunctive are normal, normal TMs, normal pharnaxy Oral: MMM, no lesions, no erythema or tonsillar swelling Neck: supple, no lymphadenopathy, no thyromegaly, no masses, normal ROM, no bruits Chest: non tender, normal shape and expansion Heart: RRR, normal S1, S2, no murmurs Lungs: clear, no wheezes, no rales Abdomen: +bs, soft, non tender, non distended, no masses, no hepatomegaly, no splenomegaly, no bruits Back: non tender, normal ROM, no scoliosis Musculoskeletal: upper extremities non tender, no obvious deformity, lower extremities non tender, no  obvious deformity Extremities: no edema, no cyanosis, no clubbing Pulses: 2+ symmetric, upper and lower extremities, normal cap refill Neurological: alert, oriented x 3, CN2-12 intact, strength normal upper extremities and lower extremities, sensation normal throughout, DTRs 2+ throughout, no cerebellar signs, gait normal Psychiatric: normal affect, behavior normal, pleasant  GU: normal male external genitalia,circumcised, left testicular prosthetic s/p orchectomy, nontender, no masses, no hernia, no lymphadenopathy Rectal: deferred    Assessment and Plan :   Encounter Diagnoses  Name Primary?   Annual physical exam Yes   Need for pneumococcal vaccination    Other fatigue    Myalgia    Essential hypertension    Impaired fasting blood sugar    Hyperhidrosis    Screening for prostate cancer    Screening for diabetes mellitus    Hyperlipidemia, unspecified hyperlipidemia type    H/O testicular cancer    Nausea and vomiting     Health screening: See your eye doctor yearly for routine vision care. See your dentist yearly for routine dental care including hygiene visits twice yearly.   Vaccines: Immunization History  Administered Date(s) Administered   Janssen (J&J) SARS-COV-2 Vaccination 01/19/2020   PFIZER(Purple Top)SARS-COV-2 Vaccination 02/03/2021   Pneumococcal Polysaccharide-23 08/16/2022   Tdap 04/03/2016   Declines both flu and covid vaccines  Counseled on the pneumococcal vaccine.  Vaccine information sheet given.  Pneumococcal vaccine PPSV23 given after consent obtained.    Cancer screens: I reviewed his August 2022 colonoscopy report  Prostate screening today given history of other cancer and given age  We discussed skin surveillance.    Separate significant issues discussed: Excess sweating/Hyperhidrosis -continue your Oxybutynin, does ok on this   High blood pressure and high cholesterol continue your medicaiton as usual  History of fatty liver  disease Eat a low fat diet Exercise regularly limit alcohol Work on losing weight  Impaired fasting glucose, screen for diabetes-labs today  BMI >30-begin trial of Wegovy along with healthy diet and exercise.  He has had a hard time finding this medicine before this visit.  We will try different pharmacy.  Myalgias-we discussed possible causes.  Labs today.  He will temporarily stop statin Crestor and see if the pain resolves.  If so we will need to find a different regimen.  Statin myopathy is a possibility here  Chronic fatigue-additional labs today.  We discussed potential causes of fatigue   Mikhail was seen today for annual exam.  Diagnoses and all orders for this visit:  Annual physical exam -     Visual acuity screening -     POCT Urinalysis DIP (Proadvantage Device) -     Comprehensive metabolic panel -     CBC with Differential/Platelet -     Lipid panel -     Hemoglobin A1c -     Testosterone -     TSH -     CK -     PSA  Need for pneumococcal vaccination -     Pneumococcal polysaccharide vaccine 23-valent greater than or equal to 2yo subcutaneous/IM  Other fatigue -     Testosterone -     TSH -     CK  Myalgia -     CK  Essential hypertension  Impaired fasting blood sugar -     Hemoglobin A1c  Hyperhidrosis  Screening for prostate cancer -     PSA  Screening for diabetes mellitus -     Hemoglobin A1c  Hyperlipidemia, unspecified hyperlipidemia type -     Lipid panel  H/O testicular cancer  Nausea and vomiting Comments: continue zofran prn; discussed hydration, liquid diet and advance as tolerated Orders: -     famotidine (PEPCID) 20 MG tablet; Take 1 tablet (20 mg total) by mouth daily.  Other orders -     aspirin EC (ASPIRIN LOW DOSE) 81 MG tablet; TAKE 1 TABLET(81 MG) BY MOUTH DAILY. SWALLOW WHOLE -     oxybutynin (DITROPAN) 5 MG tablet; TAKE 1 TABLET(5 MG) BY MOUTH TWICE DAILY -     Semaglutide-Weight Management (WEGOVY) 0.25 MG/0.5ML  SOAJ; Inject 0.25 mg into the skin once a week.   Follow-up pending labs, yearly for physical

## 2022-08-17 LAB — COMPREHENSIVE METABOLIC PANEL
ALT: 47 IU/L — ABNORMAL HIGH (ref 0–44)
AST: 39 IU/L (ref 0–40)
Albumin/Globulin Ratio: 1.8 (ref 1.2–2.2)
Albumin: 4.8 g/dL (ref 4.1–5.1)
Alkaline Phosphatase: 98 IU/L (ref 44–121)
BUN/Creatinine Ratio: 13 (ref 9–20)
BUN: 14 mg/dL (ref 6–24)
Bilirubin Total: 0.7 mg/dL (ref 0.0–1.2)
CO2: 23 mmol/L (ref 20–29)
Calcium: 10 mg/dL (ref 8.7–10.2)
Chloride: 103 mmol/L (ref 96–106)
Creatinine, Ser: 1.05 mg/dL (ref 0.76–1.27)
Globulin, Total: 2.7 g/dL (ref 1.5–4.5)
Glucose: 87 mg/dL (ref 70–99)
Potassium: 4.5 mmol/L (ref 3.5–5.2)
Sodium: 141 mmol/L (ref 134–144)
Total Protein: 7.5 g/dL (ref 6.0–8.5)
eGFR: 88 mL/min/{1.73_m2} (ref >59)

## 2022-08-17 LAB — CBC WITH DIFFERENTIAL/PLATELET
Basophils Absolute: 0 10*3/uL (ref 0.0–0.2)
Basos: 1 %
EOS (ABSOLUTE): 0.2 10*3/uL (ref 0.0–0.4)
Eos: 3 %
Hematocrit: 48.8 % (ref 37.5–51.0)
Hemoglobin: 16.4 g/dL (ref 13.0–17.7)
Immature Grans (Abs): 0 10*3/uL (ref 0.0–0.1)
Immature Granulocytes: 0 %
Lymphocytes Absolute: 1.8 10*3/uL (ref 0.7–3.1)
Lymphs: 28 %
MCH: 30.8 pg (ref 26.6–33.0)
MCHC: 33.6 g/dL (ref 31.5–35.7)
MCV: 92 fL (ref 79–97)
Monocytes Absolute: 0.7 10*3/uL (ref 0.1–0.9)
Monocytes: 11 %
Neutrophils Absolute: 3.6 10*3/uL (ref 1.4–7.0)
Neutrophils: 57 %
Platelets: 352 10*3/uL (ref 150–450)
RBC: 5.33 x10E6/uL (ref 4.14–5.80)
RDW: 12.9 % (ref 11.6–15.4)
WBC: 6.4 10*3/uL (ref 3.4–10.8)

## 2022-08-17 LAB — TSH: TSH: 0.647 u[IU]/mL (ref 0.450–4.500)

## 2022-08-17 LAB — LIPID PANEL
Chol/HDL Ratio: 3 ratio (ref 0.0–5.0)
Cholesterol, Total: 148 mg/dL (ref 100–199)
HDL: 49 mg/dL (ref >39)
LDL Chol Calc (NIH): 79 mg/dL (ref 0–99)
Triglycerides: 109 mg/dL (ref 0–149)
VLDL Cholesterol Cal: 20 mg/dL (ref 5–40)

## 2022-08-17 LAB — TESTOSTERONE: Testosterone: 366 ng/dL (ref 264–916)

## 2022-08-17 LAB — HEMOGLOBIN A1C
Est. average glucose Bld gHb Est-mCnc: 128 mg/dL
Hgb A1c MFr Bld: 6.1 % — ABNORMAL HIGH (ref 4.8–5.6)

## 2022-08-17 LAB — CK: Total CK: 258 U/L (ref 49–439)

## 2022-08-17 LAB — PSA: Prostate Specific Ag, Serum: 1.1 ng/mL (ref 0.0–4.0)

## 2022-08-19 ENCOUNTER — Other Ambulatory Visit: Payer: Self-pay | Admitting: Medical

## 2022-08-19 NOTE — Progress Notes (Signed)
Results sent through MyChart

## 2022-09-09 ENCOUNTER — Telehealth: Payer: Self-pay | Admitting: Internal Medicine

## 2022-09-09 NOTE — Telephone Encounter (Signed)
Pt called and states that he is Scott Moss and last Tuesday on the way down [place] [employer] [place]. Around 4am he started having diarrhea. Thought it was food posioning but he can not keep anything in his stomach. Everything he eats or drinks goes through him. He weight a couple weeks ago at 236 and he's now 224. He has tried everything he can think of over the counter to help with stopping diarrhea. He doesn't feel bad but diarrhea is straight water. He is coming in tomorrow for a visit. He can not do virtual or visit today as he is in [facility]

## 2022-09-09 NOTE — Telephone Encounter (Signed)
Pt is already doing this and nothing is working

## 2022-09-10 ENCOUNTER — Ambulatory Visit (HOSPITAL_BASED_OUTPATIENT_CLINIC_OR_DEPARTMENT_OTHER)
Admission: RE | Admit: 2022-09-10 | Discharge: 2022-09-10 | Disposition: A | Discharge status: Home / Self Care | Payer: BC Managed Care – PPO | Source: Ambulatory Visit | Attending: Medical | Admitting: Medical

## 2022-09-10 ENCOUNTER — Encounter: Payer: Self-pay | Admitting: Medical

## 2022-09-10 ENCOUNTER — Other Ambulatory Visit: Payer: Self-pay | Admitting: Medical

## 2022-09-10 ENCOUNTER — Ambulatory Visit: Payer: BC Managed Care – PPO | Admitting: Medical

## 2022-09-10 VITALS — BP 130/88 | HR 67 | Temp 97.3°F | Wt 235.0 lb

## 2022-09-10 DIAGNOSIS — R197 Diarrhea, unspecified: Secondary | ICD-10-CM | POA: Diagnosis not present

## 2022-09-10 DIAGNOSIS — R112 Nausea with vomiting, unspecified: Secondary | ICD-10-CM

## 2022-09-10 DIAGNOSIS — R1011 Right upper quadrant pain: Secondary | ICD-10-CM | POA: Insufficient documentation

## 2022-09-10 DIAGNOSIS — R109 Unspecified abdominal pain: Secondary | ICD-10-CM | POA: Diagnosis not present

## 2022-09-10 LAB — CBC WITH DIFFERENTIAL/PLATELET
Basophils Absolute: 0 10*3/uL (ref 0.0–0.2)
Basos: 0 %
EOS (ABSOLUTE): 0.1 10*3/uL (ref 0.0–0.4)
Eos: 3 %
Hematocrit: 45.2 % (ref 37.5–51.0)
Hemoglobin: 15.5 g/dL (ref 13.0–17.7)
Lymphocytes Absolute: 1.7 10*3/uL (ref 0.7–3.1)
Lymphs: 31 %
MCH: 30.5 pg (ref 26.6–33.0)
MCHC: 34.3 g/dL (ref 31.5–35.7)
MCV: 89 fL (ref 79–97)
Monocytes Absolute: 0.5 10*3/uL (ref 0.1–0.9)
Monocytes: 10 %
Neutrophils Absolute: 3 10*3/uL (ref 1.4–7.0)
Neutrophils: 56 %
Platelets: 330 10*3/uL (ref 150–450)
RBC: 5.08 x10E6/uL (ref 4.14–5.80)
RDW: 13.7 % (ref 11.6–15.4)
WBC: 5.4 10*3/uL (ref 3.4–10.8)

## 2022-09-10 LAB — COMPREHENSIVE METABOLIC PANEL
ALT: 49 IU/L — ABNORMAL HIGH (ref 0–44)
AST: 24 IU/L (ref 0–40)
Albumin/Globulin Ratio: 2 (ref 1.2–2.2)
Albumin: 4.3 g/dL (ref 4.1–5.1)
Alkaline Phosphatase: 94 IU/L (ref 44–121)
BUN/Creatinine Ratio: 12 (ref 9–20)
BUN: 12 mg/dL (ref 6–24)
Bilirubin Total: 0.6 mg/dL (ref 0.0–1.2)
CO2: 25 mmol/L (ref 20–29)
Calcium: 8.9 mg/dL (ref 8.7–10.2)
Chloride: 104 mmol/L (ref 96–106)
Creatinine, Ser: 1 mg/dL (ref 0.76–1.27)
Globulin, Total: 2.1 g/dL (ref 1.5–4.5)
Glucose: 99 mg/dL (ref 70–99)
Potassium: 4.2 mmol/L (ref 3.5–5.2)
Sodium: 139 mmol/L (ref 134–144)
Total Protein: 6.4 g/dL (ref 6.0–8.5)
eGFR: 93 mL/min/{1.73_m2} (ref >59)

## 2022-09-10 LAB — LIPASE: Lipase: 45 U/L (ref 13–78)

## 2022-09-10 MED ORDER — METRONIDAZOLE 500 MG PO TABS: 500.0000 mg | ORAL_TABLET | Freq: Two times a day (BID) | ORAL | 0 refills | Status: AC

## 2022-09-10 MED ORDER — ONDANSETRON 4 MG PO TBDP: 4.0000 mg | ORAL_TABLET | Freq: Three times a day (TID) | ORAL | 0 refills | Status: DC | PRN

## 2022-09-10 MED ORDER — OXYCODONE-ACETAMINOPHEN 7.5-325 MG PO TABS: 1.0000 | ORAL_TABLET | ORAL | 0 refills | Status: DC | PRN

## 2022-09-10 NOTE — Progress Notes (Signed)
Results sent through MyChart

## 2022-09-10 NOTE — Progress Notes (Signed)
Ultrasound fortunately does not show abnormality of the gallbladder but there is diffuse fatty liver findings.  I did send an antibiotic Flagyl to begin for possible bowel infection.  Consider a probiotic over-the-counter as well such as IBgard or align  I also recommend he do a follow-up with Dr. Bryan Lemma his gastrointestinal doctor given the fatty liver disease  Use the Zofran as needed for nausea, pain medicine as needed  Only use clear fluids next few days and small portions of bland foods such as bananas, rice, applesauce, toast  Call in a few days to let me know if improving or not

## 2022-09-10 NOTE — Progress Notes (Signed)
Subjective: Chief Complaint  Patient presents with   Diarrhea    last Tuesday on the way down  to [facility] for work he [place] and [place]. Around 4am he started having diarrhea. Everything he eats or drinks goes through him. Lost 12 pounds recently. He has tried everything he can think of over the counter to help with stopping diarrhea. He doesn't feel bad but diarrhea is straight water. Having severe side pain   Here for not feeling well, diarrhea.  Started a week ago.  He ate at a [place] but he has eaten at [employer] numerous times before.  He denies eating undercooked foods or raw food.  No recent camping or travel.  No recent new animal exposure.  No sick contacts with similar.  Having diarrhea daily x 1 week, watery, between a few to up to 10 times daily.  Yellow looking water.   No blood in stool.  No fever.   Vomited started yesterday x 1.  Is nauseated but no vomiting yet this morning.  He does note right upper quadrant pain, pain after eating going on pretty much all the past week.  No recent alcohol use.  No other abdominal pain.  No urinary complaints.  No blood in the urine or stool.  No other aggravating or relieving factors. No other complaint.   Past Medical History:  Diagnosis Date   Arthritis    Atypical chest pain 07/06/2015   Non-obstructive CAD on [facility] 04/2010.   Chondromalacia of left knee 11/1658   Complication of anesthesia    Family history of cancer    multiple family members, multiple types.   Sees Dr. Marin Olp   Family history of cancer    Family history of premature CAD    GERD (gastroesophageal reflux disease)    H/O cardiovascular stress test 07/14/2015   myocardial perfusion scan, normal, EF 45-54% mildy reduced LV function, Dr. Oval Linsey   H/O echocardiogram 07/18/2015   LV mild hypertrophy, 55-60%EF, Dr. Oval Linsey   High cholesterol    Hypertension    Obesity    Overweight    PONV (postoperative nausea and vomiting)     Testicular cancer ([place]) 02/2005   Dr. Marin Olp   Past Surgical History:  Procedure Laterality Date   ANTERIOR CERVICAL DECOMP/DISCECTOMY FUSION  11/11/2008   C5-6   APPENDECTOMY     CARDIAC CATHETERIZATION  04/13/2010   no disease   CERVICAL SPINE SURGERY  08/05/2013   Dr. Deri Fuelling   COLONOSCOPY     prior, no problems per patient, year unknown, possibly in early 37s.   HIP ARTHROPLASTY Left 03/2020   HIP ARTHROPLASTY Right 04/2020   KNEE ARTHROSCOPY Right    KNEE ARTHROSCOPY Left 10/19/2015   Procedure: LEFT KNEE SCOPE CHONDROPLASTY;  Surgeon: [name], MD;  Location: [place];  Service: Orthopedics;  Laterality: Left;   KNEE ARTHROSCOPY WITH LATERAL RELEASE Left 09/18/2012   Procedure: KNEE ARTHROSCOPY WITH LATERAL RELEASE;  Surgeon: [name], MD;  Location: [place];  Service: Orthopedics;  Laterality: Left;  WITH DEBRIDEMENT AND SHAVING(CHONDROPLASTY), Excision Medial Plica   NASAL SINUS SURGERY  08/05/2012   PARTIAL KNEE ARTHROPLASTY Left 08/15/2016   Procedure: PATELLA FEMORAL REPLACEMENT LEFT KNEE;  Surgeon: [name], MD;  Location: [place];  Service: Orthopedics;  Laterality: Left;   RADICAL ORCHIECTOMY  02/07/2005   left; with left testicular implant   Current Outpatient Medications on File  Prior to Visit  Medication Sig Dispense Refill   acetaminophen (TYLENOL) 500 MG tablet Take 500 mg by mouth every 6 (six) hours as needed for mild pain.     aspirin EC (ASPIRIN LOW DOSE) 81 MG tablet TAKE 1 TABLET(81 MG) BY MOUTH DAILY. SWALLOW WHOLE 90 tablet 3   famotidine (PEPCID) 20 MG tablet Take 1 tablet (20 mg total) by mouth daily. 180 tablet 3   fexofenadine (ALLEGRA) 180 MG tablet Take 180 mg by mouth daily.     oxybutynin (DITROPAN) 5 MG tablet TAKE 1 TABLET(5 MG) BY MOUTH TWICE DAILY 180 tablet 1   rosuvastatin (CRESTOR) 10 MG tablet TAKE 1 TABLET(10 MG) BY MOUTH DAILY 90 tablet 0    valsartan-hydrochlorothiazide (DIOVAN HCT) 160-12.5 MG tablet Take 1 tablet by mouth daily. 90 tablet 3   Semaglutide-Weight Management (WEGOVY) 0.25 MG/0.5ML SOAJ Inject 0.25 mg into the skin once a week. (Patient not taking: Reported on 09/10/2022) 2 mL 0   No current facility-administered medications on file prior to visit.    ROS as in subjective    Objective: BP 130/88   Pulse 67   Temp (!) 97.3 F (36.3 C)   Wt 235 lb (106.6 kg)   BMI 34.70 kg/m   Wt Readings from Last 3 Encounters:  09/10/22 235 lb (106.6 kg)  08/16/22 236 lb (107 kg)  07/24/22 224 lb (101.6 kg)   Gen: wd, wn, ill appearing, holding emesis bag Lungs clear Heart rrr, normal s1, s2, no murmurs Abdomen: Positive bowel sounds, soft, right upper quadrant tender, otherwise nontender, nondistended, no guarding Back nontender Pulses normal Extremities without edema    Assessment: Encounter Diagnoses  Name Primary?   RUQ pain Yes   Nausea and vomiting, unspecified vomiting type    Diarrhea, unspecified type     Plan: We discussed possible etiologies.  Given his acute findings and symptoms I am concerned about gallbladder issues versus infectious diarrhea versus other.  Stat labs today.  We will pursue stat ultrasound lower quadrant.  In the meantime can use medications below.  Follow-up pending labs and imaging.  Go to the emergency department if worsening throughout the day  Scott Moss was seen today for diarrhea.  Diagnoses and all orders for this visit:  RUQ pain -     CBC with Differential/Platelet -     Comprehensive metabolic panel -     Lipase -     US Abdomen Complete; Future  Nausea and vomiting, unspecified vomiting type -     CBC with Differential/Platelet -     Comprehensive metabolic panel -     Lipase -     US Abdomen Complete; Future  Diarrhea, unspecified type -     CBC with Differential/Platelet -     Comprehensive metabolic panel -     Lipase -     US Abdomen Complete;  Future  Other orders -     ondansetron (ZOFRAN-ODT) 4 MG disintegrating tablet; Take 1 tablet (4 mg total) by mouth every 8 (eight) hours as needed for nausea or vomiting. -     oxyCODONE-acetaminophen (PERCOCET) 7.5-325 MG tablet; Take 1 tablet by mouth every 4 (four) hours as needed for severe pain.   F/u pending labs

## 2022-10-25 ENCOUNTER — Telehealth: Payer: BC Managed Care – PPO | Admitting: Nurse Practitioner

## 2022-10-25 ENCOUNTER — Encounter: Payer: Self-pay | Admitting: Nurse Practitioner

## 2022-10-25 VITALS — Temp 101.0°F | Wt 233.0 lb

## 2022-10-25 DIAGNOSIS — J111 Influenza due to unidentified influenza virus with other respiratory manifestations: Secondary | ICD-10-CM | POA: Diagnosis not present

## 2022-10-25 MED ORDER — BENZONATATE 200 MG PO CAPS: 200.0000 mg | ORAL_CAPSULE | Freq: Two times a day (BID) | ORAL | 0 refills | Status: DC | PRN

## 2022-10-25 MED ORDER — HYDROCODONE BIT-HOMATROP MBR 5-1.5 MG/5ML PO SOLN: 5.0000 mL | Freq: Three times a day (TID) | ORAL | 0 refills | Status: DC | PRN

## 2022-10-25 MED ORDER — OSELTAMIVIR PHOSPHATE 75 MG PO CAPS: 75.0000 mg | ORAL_CAPSULE | Freq: Two times a day (BID) | ORAL | 0 refills | Status: DC

## 2022-10-25 NOTE — Progress Notes (Signed)
Virtual Visit Encounter mychart visit.   I connected with  Scott Moss on 11/08/22 at  4:00 PM EDT by secure video and audio telemedicine application. I verified that I am speaking with the correct person using two identifiers.   I introduced myself as a Publishing rights manager with the practice. The limitations of evaluation and management by telemedicine discussed with the patient and the availability of in person appointments. The patient expressed verbal understanding and consent to proceed.  Participating parties in this visit include: Myself and patient  The patient is: Patient Location: Home I am: Provider Location: Home Office Subjective:    CC and HPI: Scott Moss is a 47 y.o. year old male presenting for new evaluation and treatment of URI symptoms. Patient reports the following:  Scott Moss reports a cough, sore throat, fever, body aches, and generalized feeling of unwell that started about 3 days ago. The symptoms came on suddenly. He has had a negative COVID test. He is not having shortness of breath. Nyquil has not been helpful.   Past medical history, Surgical history, Family history not pertinant except as noted below, Social history, Allergies, and medications have been entered into the medical record, reviewed, and corrections made.   Review of Systems:  All review of systems negative except what is listed in the HPI  Objective:    Alert and oriented x 4 Ill appearing Speaking in clear sentences with no shortness of breath. No distress.  Impression and Recommendations:    Problem List Items Addressed This Visit     Influenza - Primary    Symptoms and presentation consistent with influenza. Given the negative COVID test and length of time he has had symptoms, I do not feel that he needs to come into the office for further testing as this could delay treatment. He is right on the cusp of the time frame for tamiflu. Discussed treatment options, recommendations for staying  out of work, and emergency protocols.  Plan: - start tamiflu today - rest and stay hydrated - stay out of work until fever free for at least 24 hours without medication to help lower temperature - seek emergency care if breathing difficulties present.       Relevant Medications   oseltamivir (TAMIFLU) 75 MG capsule   HYDROcodone bit-homatropine (HYCODAN) 5-1.5 MG/5ML syrup   benzonatate (TESSALON) 200 MG capsule    orders and follow up as documented in EMR I discussed the assessment and treatment plan with the patient. The patient was provided an opportunity to ask questions and all were answered. The patient agreed with the plan and demonstrated an understanding of the instructions.   The patient was advised to call back or seek an in-person evaluation if the symptoms worsen or if the condition fails to improve as anticipated.  Follow-Up: prn  I provided 16 minutes of non-face-to-face interaction with this non face-to-face encounter including intake, same-day documentation, and chart review.   Tollie Eth, NP , DNP, AGNP-c [facility]

## 2022-10-28 ENCOUNTER — Telehealth: Payer: Self-pay | Admitting: Medical

## 2022-10-28 NOTE — Telephone Encounter (Signed)
You did a virtual Friday with Scott Moss and he called this morning wanting to know if the dates can be changed on the letter you wrote for him to return to work. He is asking if he can also be out of work Tuesday 10/29/22. He is still not feeling well, would this be ok?

## 2022-10-30 ENCOUNTER — Telehealth: Payer: Self-pay | Admitting: Medical

## 2022-10-30 MED ORDER — WEGOVY 0.25 MG/0.5ML ~~LOC~~ SOAJ: 0.2500 mg | SUBCUTANEOUS | 0 refills | Status: DC

## 2022-10-30 NOTE — Telephone Encounter (Signed)
Pt called and is requesting a refill on his wegocy Please send to the [facility], [place]

## 2022-10-30 NOTE — Telephone Encounter (Signed)
OK for patient to remain out of work as long as he is still having symptoms. Adam, can you call to make sure he is feeling better? It is ok to rewrite the note for him.

## 2022-11-04 ENCOUNTER — Telehealth: Payer: Self-pay | Admitting: Medical

## 2022-11-04 DIAGNOSIS — Z6834 Body mass index (BMI) 34.0-34.9, adult: Secondary | ICD-10-CM

## 2022-11-04 MED ORDER — WEGOVY 0.5 MG/0.5ML ~~LOC~~ SOAJ: 0.5000 mg | SUBCUTANEOUS | 1 refills | Status: DC

## 2022-11-04 NOTE — Telephone Encounter (Signed)
Medication sent for 0.5mg  dosing for 4 weeks with 1 refill.

## 2022-11-04 NOTE — Telephone Encounter (Signed)
Pt called in and said pharmacy wont refill his wegovy because the dosage was not increased. He is is asking for an increase and to be sent to [facility] [place]

## 2022-11-05 NOTE — Telephone Encounter (Signed)
Working on [employer]A for wegovy 0.5mg  through covermymeds. Waiting on response

## 2022-11-07 NOTE — Telephone Encounter (Signed)
[place] has denied wegovy but I have completed an appeal as pt has HTN, cholesterol, and BMI over 30

## 2022-11-08 ENCOUNTER — Encounter: Payer: Self-pay | Admitting: Nurse Practitioner

## 2022-11-08 DIAGNOSIS — J111 Influenza due to unidentified influenza virus with other respiratory manifestations: Secondary | ICD-10-CM | POA: Insufficient documentation

## 2022-11-08 NOTE — Assessment & Plan Note (Signed)
Symptoms and presentation consistent with influenza. Given the negative COVID test and length of time he has had symptoms, I do not feel that he needs to come into the office for further testing as this could delay treatment. He is right on the cusp of the time frame for tamiflu. Discussed treatment options, recommendations for staying out of work, and emergency protocols.  Plan: - start tamiflu today - rest and stay hydrated - stay out of work until fever free for at least 24 hours without medication to help lower temperature - seek emergency care if breathing difficulties present.

## 2022-11-08 NOTE — Telephone Encounter (Signed)
Appeal can take up until April 10th for a decision

## 2022-11-09 ENCOUNTER — Other Ambulatory Visit: Payer: Self-pay | Admitting: Medical

## 2022-11-11 ENCOUNTER — Telehealth: Payer: Self-pay

## 2022-11-11 NOTE — Telephone Encounter (Signed)
Received fax from pharmacy requesting refill for rosuvastatin 10 mg tabs. Prescription already sent.

## 2022-11-13 NOTE — Telephone Encounter (Signed)
Approved 11/05/22-03/11/23   Approved for all strengths of titration   Pt was notified

## 2022-12-03 ENCOUNTER — Other Ambulatory Visit: Payer: Self-pay | Admitting: Medical

## 2022-12-03 ENCOUNTER — Telehealth: Payer: Self-pay | Admitting: Medical

## 2022-12-03 MED ORDER — WEGOVY 1 MG/0.5ML ~~LOC~~ SOAJ: 1.0000 mg | SUBCUTANEOUS | 0 refills | Status: DC

## 2022-12-03 NOTE — Telephone Encounter (Signed)
Pt called for refill of the next dose of Wegovy. Please send to [facility]. Pt can be reached at [phone].

## 2022-12-11 ENCOUNTER — Ambulatory Visit: Payer: BC Managed Care – PPO | Admitting: Medical

## 2022-12-11 VITALS — BP 122/80 | HR 90 | Temp 97.7°F | Wt 225.0 lb

## 2022-12-11 DIAGNOSIS — K760 Fatty (change of) liver, not elsewhere classified: Secondary | ICD-10-CM

## 2022-12-11 DIAGNOSIS — R1031 Right lower quadrant pain: Secondary | ICD-10-CM

## 2022-12-11 DIAGNOSIS — I10 Essential (primary) hypertension: Secondary | ICD-10-CM | POA: Diagnosis not present

## 2022-12-11 DIAGNOSIS — R1011 Right upper quadrant pain: Secondary | ICD-10-CM

## 2022-12-11 DIAGNOSIS — R195 Other fecal abnormalities: Secondary | ICD-10-CM | POA: Diagnosis not present

## 2022-12-11 MED ORDER — AMOXICILLIN-POT CLAVULANATE 875-125 MG PO TABS: 1.0000 | ORAL_TABLET | Freq: Two times a day (BID) | ORAL | 0 refills | Status: DC

## 2022-12-11 NOTE — Patient Instructions (Signed)
Expect a phone call about the HIDA scan gallbladder test  For the next 24 hours please eat no solid food.  Just use clear fluids only such as water, soup broth, ginger ale, other clear fluids.  You can use Tylenol for pain 2-3 times a day if needed  Begin Augmentin antibiotic for possible bowel infection  If your symptoms improved significantly in the next 24 hours you can add back some other small portions of solid foods such as bananas, rice, applesauce, toast, soup.  If much worse in the next 48 hours get reevaluated as you may need labs and a CT scan  But for now we will assume that you could have potential gallbladder disease or possible bowel infection.  Diverticulitis  Diverticulitis happens when poop (stool) and bacteria get trapped in small pouches in the colon called diverticula. These pouches may form if you have a condition called diverticulosis. When the poop and bacteria get trapped, it can cause an infection and inflammation. Diverticulitis may cause severe stomach pain and diarrhea. It can also lead to tissue damage in your colon. This can cause bleeding or blockage. In some cases, the diverticula may burst (rupture). This can cause infected poop to go into other parts of your abdomen. What are the causes? This condition is caused by poop getting trapped in the diverticula. This allows bacteria to grow. It can lead to inflammation and infection. What increases the risk? You are more likely to get this condition if you have diverticulosis. You are also more at risk if: You are overweight or obese. You do not get enough exercise. You drink alcohol. You smoke. You eat a lot of red meat, such as beef, pork, or lamb. You do not get enough fiber. Foods high in fiber include fruits, vegetables, beans, nuts, and whole grains. You are over 56 years of age. What are the signs or symptoms? Symptoms of this condition may include: Pain and tenderness in the abdomen. This pain is  often felt on the left side but may occur in other spots. Fever and chills. Nausea and vomiting. Cramping. Bloating. Changes in how often you poop. Blood in your poop. How is this diagnosed? This condition is diagnosed based on your medical history and a physical exam. You may also have tests done to make sure there is nothing else causing your condition. These tests may include: Blood tests. Tests done on your pee (urine). A CT scan of the abdomen. You may need to have a colonoscopy. This is an exam to look at your whole large intestine. During the exam, a tube is put into the opening of your butt (anus) and then moved into your rectum, colon, and other parts of the large intestine. This exam is done to look at the diverticula. It can also see if there is something else that may be causing your symptoms. How is this treated? Most cases are mild and can be treated at home. You may be told to: Take over-the-counter pain medicine. Only eat and drink clear liquids. Take antibiotics. Rest. More severe cases may need to be treated at a hospital. Treatment may include: Not eating or drinking. Taking pain medicines. Getting antibiotics through an IV. Getting fluids and nutrition through an IV. Surgery. Follow these instructions at home: Medicines Take over-the-counter and prescription medicines only as told by your health care provider. These include fiber supplements, probiotics, and medicines to soften your poop (stool softeners). If you were prescribed antibiotics, take them as told by  your provider. Do not stop using the antibiotic even if you start to feel better. Ask your provider if the medicine prescribed to you requires you to avoid driving or using machinery. Eating and drinking  Follow the diet told by your provider. You may need to only eat and drink liquids. After your symptoms get better, you may be able to return to a more normal diet. You may be told to eat at least 25  grams (25 g) of fiber each day. Fiber makes it easier to poop. Healthy sources of fiber include: Berries. One cup has 4-8 g of fiber. Beans or lentils. One-half cup has 5-8 g of fiber. Green vegetables. One cup has 4 g of fiber. Avoid eating red meat. General instructions Do not use any products that contain nicotine or tobacco. These products include cigarettes, chewing tobacco, and vaping devices, such as e-cigarettes. If you need help quitting, ask your provider. Exercise for at least 30 minutes, 3 times a week. Exercise hard enough to raise your heart rate and break a sweat. Contact a health care provider if: Your pain gets worse. Your pooping does not go back to normal. Your symptoms do not get better with treatment. Your symptoms get worse all of a sudden. You have a fever. You vomit more than one time. Your poop is bloody, black, or tarry. This information is not intended to replace advice given to you by your health care provider. Make sure you discuss any questions you have with your health care provider. Document Revised: 04/18/2022 Document Reviewed: 04/18/2022 Elsevier Patient Education  2023 [employer]      Cholecystitis Cholecystitis is irritation and swelling (inflammation) of the gallbladder. The gallbladder: Is an organ that is shaped like a pear. Is under the liver on the right side of the body. Stores bile. Bile helps the body break down (digest) the fats in food. This condition can occur all of a sudden. It needs to be treated. What are the causes? This condition may be caused by stones or lumps that form in the gallbladder (gallstones). Gallstones can block the tube (duct) that carries bile out of your gallbladder. Other causes include: Damage to the gallbladder due to less blood flow. Germs in the bile ducts. Scars, kinks, or adhesions in the bile ducts. Abnormal growths (tumors) in the liver, pancreas, or gallbladder. What increases the risk? You are  more likely to develop this condition if: You are male and between the ages of 34-62. You take birth control pills. You use estrogen. You take certain medicines that make you more likely to develop gallstones. You are overweight (obese). You have a very bad reaction to an infection (sepsis). You have been hospitalized due to a serious condition, such as a burn or illness. You have not eaten or drank for a long time. What are the signs or symptoms? Symptoms of this condition include: Pain in the upper right part of the belly (abdomen). A lump over the gallbladder. Bloating in the belly. Feeling sick to your stomach (nauseous). Vomiting. Fever. Chills. How is this treated? This condition may be treated with: Medicines to treat pain. Giving fluids through an IV tube. Not eating or drinking (fasting). Antibiotic medicines. Surgery to take out your gallbladder. Gallbladder drainage. Follow these instructions at home: Medicines  Take over-the-counter and prescription medicines only as told by your doctor. If you were prescribed an antibiotic medicine, take it as told by your doctor. Do not stop taking it even if you start  to feel better. General instructions Follow instructions from your doctor about what to eat or drink. Do not eat or drink anything that makes you sick again. Do not smoke or use any products that contain nicotine or tobacco. If you need help quitting, ask your doctor. Keep all follow-up visits. Contact a doctor if: You have pain and your medicine does not help. You have a fever. Get help right away if: Your pain moves to: Another part of your belly. Your back. Your symptoms do not go away. You have new symptoms. These symptoms may be an emergency. Get help right away. Call 911. Do not wait to see if the symptoms will go away. Do not drive yourself to the hospital. Summary This condition may be caused by stones or lumps that form in the gallbladder  (gallstones). A common symptom is pain in your belly. This condition may be treated with surgery to take out your gallbladder. Follow instructions from your doctor about what to eat or drink. This information is not intended to replace advice given to you by your health care provider. Make sure you discuss any questions you have with your health care provider. Document Revised: 01/23/2021 Document Reviewed: 01/23/2021 Elsevier Patient Education  2023 [employer].

## 2022-12-11 NOTE — Progress Notes (Signed)
Subjective:  Scott Moss is a 47 y.o. male who presents for Chief Complaint  Patient presents with   still having right side    Sharp right side pain x 2 days ago. Eating or drinking anything will come back up     Here for right-sided belly pain.  Started 2 days ago.  Gets pain right after eating particular in the right side.  Eating or drinking anything will make him want to throw up.  He has had loose stool for the last 2 days.  3 loose stools this morning and by the third the stool was just clear yellow liquid.  He had loose stool and pain after eating cereal this morning.  Appetites been down since his pain came back on.  I saw him in February for similar symptoms.  Back in February he had pain after eating [place] food and right after eating.  He also has noticed this for a week at that time.  No urinary complaints today.  No blood in the stool or urine.  No fever.  No body aches or chills.  No other aggravating or relieving factors.    No other c/o.   Past Medical History:  Diagnosis Date   Arthritis    Atypical chest pain 07/06/2015   Non-obstructive CAD on [facility] 04/2010.   Chondromalacia of left knee 09/2012   Complication of anesthesia    Family history of cancer    multiple family members, multiple types.   Sees Dr. Myna Moss   Family history of cancer    Family history of premature CAD    GERD (gastroesophageal reflux disease)    H/O cardiovascular stress test 07/14/2015   myocardial perfusion scan, normal, EF 45-54% mildy reduced LV function, Dr. Duke Moss   H/O echocardiogram 07/18/2015   LV mild hypertrophy, 55-60%EF, Dr. Duke Moss   High cholesterol    Hypertension    Obesity    Overweight    PONV (postoperative nausea and vomiting)    Testicular cancer (HCC) 02/2005   Dr. Myna Moss   Current Outpatient Medications on File Prior to Visit  Medication Sig Dispense Refill   aspirin EC (ASPIRIN LOW DOSE) 81 MG tablet TAKE 1 TABLET(81 MG) BY MOUTH DAILY. SWALLOW WHOLE  90 tablet 3   famotidine (PEPCID) 20 MG tablet Take 1 tablet (20 mg total) by mouth daily. 180 tablet 3   fexofenadine (ALLEGRA) 180 MG tablet Take 180 mg by mouth daily.     oxybutynin (DITROPAN) 5 MG tablet TAKE 1 TABLET(5 MG) BY MOUTH TWICE DAILY 180 tablet 1   rosuvastatin (CRESTOR) 10 MG tablet TAKE 1 TABLET(10 MG) BY MOUTH DAILY 90 tablet 1   Semaglutide-Weight Management (WEGOVY) 0.5 MG/0.5ML SOAJ Inject 0.5 mg into the skin once a week. 2 mL 1   valsartan-hydrochlorothiazide (DIOVAN HCT) 160-12.5 MG tablet Take 1 tablet by mouth daily. 90 tablet 3   acetaminophen (TYLENOL) 500 MG tablet Take 500 mg by mouth every 6 (six) hours as needed for mild pain.     ondansetron (ZOFRAN-ODT) 4 MG disintegrating tablet Take 1 tablet (4 mg total) by mouth every 8 (eight) hours as needed for nausea or vomiting. (Patient not taking: Reported on 12/11/2022) 20 tablet 0   Semaglutide-Weight Management (WEGOVY) 1 MG/0.5ML SOAJ Inject 1 mg into the skin once a week. (Patient not taking: Reported on 12/11/2022) 2 mL 0   No current facility-administered medications on file prior to visit.   Past Surgical History:  Procedure Laterality Date  ANTERIOR CERVICAL DECOMP/DISCECTOMY FUSION  11/11/2008   C5-6   APPENDECTOMY     CARDIAC CATHETERIZATION  04/13/2010   no disease   CERVICAL SPINE SURGERY  08/05/2013   Dr. Lelon Moss   COLONOSCOPY     prior, no problems per patient, year unknown, possibly in early 30s.   HIP ARTHROPLASTY Left 03/2020   HIP ARTHROPLASTY Right 04/2020   KNEE ARTHROSCOPY Right    KNEE ARTHROSCOPY Left 10/19/2015   Procedure: LEFT KNEE SCOPE CHONDROPLASTY;  Surgeon: Scott Ave, MD;  Location: [facility];  Service: Orthopedics;  Laterality: Left;   KNEE ARTHROSCOPY WITH LATERAL RELEASE Left 09/18/2012   Procedure: KNEE ARTHROSCOPY WITH LATERAL RELEASE;  Surgeon: Scott Ave, MD;  Location: [facility];  Service: Orthopedics;  Laterality: Left;  WITH  DEBRIDEMENT AND SHAVING(CHONDROPLASTY), Excision Medial [place]   NASAL SINUS SURGERY  08/05/2012   PARTIAL KNEE ARTHROPLASTY Left 08/15/2016   Procedure: PATELLA FEMORAL REPLACEMENT LEFT KNEE;  Surgeon: Scott Ave, MD;  Location: [facility];  Service: Orthopedics;  Laterality: Left;   RADICAL ORCHIECTOMY  02/07/2005   left; with left testicular implant     The following portions of the patient's history were reviewed and updated as appropriate: allergies, current medications, past family history, past medical history, past social history, past surgical history and problem list.  ROS Otherwise as in subjective above  Objective: BP 122/80   Pulse 90   Temp 97.7 F (36.5 C)   Wt 225 lb (102.1 kg)   BMI 33.23 kg/m   General appearance: alert, no distress, well developed, well nourished Heart: RRR, normal S1, S2, no murmurs Lungs: CTA bilaterally, no wheezes, rhonchi, or rales Abdomen: +bs, soft, tender RUQ, RLQ, and LLQ but milder in LLQ, non tender, non distended, no masses, no hepatomegaly, no splenomegaly Pulses: 2+ radial pulses, 2+ pedal pulses, normal cap refill Ext: no edema   Assessment: Encounter Diagnoses  Name Primary?   RUQ pain Yes   Loose stools    Abdominal pain, right lower quadrant    Essential hypertension    Fatty liver      Plan: We discussed his symptoms and findings now and from the February 2024 visit as well.  He had an ultrasound of abdomen 09/10/2022 showing diffuse increased echogenicity in the liver consistent with hepatic steatosis, but otherwise normal.  He does have a history of diverticulosis on prior colonoscopy from 2 years ago.  Differential today includes gallbladder disease, diverticulitis, other bowel infection or other.  I still have strong suspicion of gallbladder disease.  We will set him up for HIDA scan  Begin Augmentin for possible diverticulitis.  He has prior colonoscopy showed diverticular disease of the  sigmoid and descending colon.  Advise clear fluids for the next 24 hours and no solid food.  Tylenol if needed.  If worse in the next few days call back or get reevaluated   Scott Moss was seen today for still having right side.  Diagnoses and all orders for this visit:  RUQ pain -     NM Hepato W/EF; Future  Loose stools -     NM Hepato W/EF; Future  Abdominal pain, right lower quadrant -     NM Hepato W/EF; Future  Essential hypertension -     NM Hepato W/EF; Future  Fatty liver -     NM Hepato W/EF; Future  Other orders -     amoxicillin-clavulanate (AUGMENTIN) 875-125 MG tablet; Take  1 tablet by mouth 2 (two) times daily.    Follow up: pending HIDA scan

## 2022-12-12 ENCOUNTER — Telehealth: Payer: Self-pay | Admitting: Family Medicine

## 2022-12-12 NOTE — Telephone Encounter (Signed)
PT states referral is 5/21 and he can't wait that long.  Please call him back and advise help [phone]

## 2022-12-13 ENCOUNTER — Other Ambulatory Visit: Payer: Self-pay | Admitting: Medical

## 2022-12-13 ENCOUNTER — Ambulatory Visit (HOSPITAL_COMMUNITY)
Admission: RE | Admit: 2022-12-13 | Discharge: 2022-12-13 | Disposition: A | Discharge status: Home / Self Care | Payer: BC Managed Care – PPO | Source: Ambulatory Visit | Attending: Medical | Admitting: Medical

## 2022-12-13 DIAGNOSIS — R112 Nausea with vomiting, unspecified: Secondary | ICD-10-CM

## 2022-12-13 DIAGNOSIS — R1011 Right upper quadrant pain: Secondary | ICD-10-CM | POA: Diagnosis not present

## 2022-12-13 MED ORDER — OMEPRAZOLE 40 MG PO CPDR: 40.0000 mg | DELAYED_RELEASE_CAPSULE | Freq: Every day | ORAL | 1 refills | Status: DC

## 2022-12-13 MED ORDER — TECHNETIUM TC 99M MEBROFENIN IV KIT
5.3000 | PACK | Freq: Once | INTRAVENOUS | Status: AC
  Administered 2022-12-13: 5.3 via INTRAVENOUS

## 2022-12-13 NOTE — Telephone Encounter (Signed)
Working on stat order

## 2022-12-13 NOTE — Progress Notes (Signed)
Results sent through MyChart

## 2022-12-16 NOTE — Progress Notes (Signed)
Called pt and went over Shane's notes & results

## 2022-12-17 ENCOUNTER — Emergency Department (HOSPITAL_COMMUNITY)
Admission: EM | Admit: 2022-12-17 | Discharge: 2022-12-17 | Disposition: A | Discharge status: Home / Self Care | Payer: BC Managed Care – PPO | Attending: Emergency Medicine | Admitting: Emergency Medicine

## 2022-12-17 ENCOUNTER — Emergency Department (HOSPITAL_COMMUNITY): Payer: BC Managed Care – PPO

## 2022-12-17 ENCOUNTER — Other Ambulatory Visit: Payer: Self-pay

## 2022-12-17 DIAGNOSIS — R1110 Vomiting, unspecified: Secondary | ICD-10-CM | POA: Insufficient documentation

## 2022-12-17 DIAGNOSIS — R1011 Right upper quadrant pain: Secondary | ICD-10-CM | POA: Diagnosis not present

## 2022-12-17 DIAGNOSIS — D72829 Elevated white blood cell count, unspecified: Secondary | ICD-10-CM | POA: Diagnosis not present

## 2022-12-17 DIAGNOSIS — N281 Cyst of kidney, acquired: Secondary | ICD-10-CM | POA: Diagnosis not present

## 2022-12-17 DIAGNOSIS — Z7982 Long term (current) use of aspirin: Secondary | ICD-10-CM | POA: Insufficient documentation

## 2022-12-17 DIAGNOSIS — R1084 Generalized abdominal pain: Secondary | ICD-10-CM | POA: Diagnosis not present

## 2022-12-17 DIAGNOSIS — R109 Unspecified abdominal pain: Secondary | ICD-10-CM | POA: Diagnosis not present

## 2022-12-17 LAB — CBC
HCT: 49.1 % (ref 39.0–52.0)
Hemoglobin: 16.6 g/dL (ref 13.0–17.0)
MCH: 30.3 pg (ref 26.0–34.0)
MCHC: 33.8 g/dL (ref 30.0–36.0)
MCV: 89.6 fL (ref 80.0–100.0)
Platelets: 320 10*3/uL (ref 150–400)
RBC: 5.48 MIL/uL (ref 4.22–5.81)
RDW: 13 % (ref 11.5–15.5)
WBC: 15.3 10*3/uL — ABNORMAL HIGH (ref 4.0–10.5)
nRBC: 0 % (ref 0.0–0.2)

## 2022-12-17 LAB — COMPREHENSIVE METABOLIC PANEL
ALT: 35 U/L (ref 0–44)
AST: 26 U/L (ref 15–41)
Albumin: 4.5 g/dL (ref 3.5–5.0)
Alkaline Phosphatase: 86 U/L (ref 38–126)
Anion gap: 10 (ref 5–15)
BUN: 18 mg/dL (ref 6–20)
CO2: 24 mmol/L (ref 22–32)
Calcium: 8.9 mg/dL (ref 8.9–10.3)
Chloride: 101 mmol/L (ref 98–111)
Creatinine, Ser: 1.04 mg/dL (ref 0.61–1.24)
GFR, Estimated: >60 mL/min (ref >60)
Glucose, Bld: 92 mg/dL (ref 70–99)
Potassium: 3.8 mmol/L (ref 3.5–5.1)
Sodium: 135 mmol/L (ref 135–145)
Total Bilirubin: 0.8 mg/dL (ref 0.3–1.2)
Total Protein: 8.1 g/dL (ref 6.5–8.1)

## 2022-12-17 LAB — URINALYSIS, ROUTINE W REFLEX MICROSCOPIC
Bilirubin Urine: NEGATIVE
Glucose, UA: NEGATIVE mg/dL
Hgb urine dipstick: NEGATIVE
Ketones, ur: 20 mg/dL — AB
Leukocytes,Ua: NEGATIVE
Nitrite: NEGATIVE
Protein, ur: NEGATIVE mg/dL
Specific Gravity, Urine: 1.015 (ref 1.005–1.030)
pH: 5 (ref 5.0–8.0)

## 2022-12-17 LAB — LIPASE, BLOOD: Lipase: 46 U/L (ref 11–51)

## 2022-12-17 MED ORDER — HYDROMORPHONE HCL 1 MG/ML IJ SOLN
1.0000 mg | Freq: Once | INTRAMUSCULAR | Status: AC
  Administered 2022-12-17: 1 mg via INTRAVENOUS
  Filled 2022-12-17: qty 1

## 2022-12-17 MED ORDER — IOHEXOL 300 MG/ML  SOLN
100.0000 mL | Freq: Once | INTRAMUSCULAR | Status: AC | PRN
  Administered 2022-12-17: 100 mL via INTRAVENOUS

## 2022-12-17 MED ORDER — ONDANSETRON 4 MG PO TBDP: 4.0000 mg | ORAL_TABLET | Freq: Three times a day (TID) | ORAL | 0 refills | Status: DC | PRN

## 2022-12-17 MED ORDER — ONDANSETRON HCL 4 MG/2ML IJ SOLN
4.0000 mg | Freq: Once | INTRAMUSCULAR | Status: AC
  Administered 2022-12-17: 4 mg via INTRAVENOUS
  Filled 2022-12-17: qty 2

## 2022-12-17 NOTE — Discharge Instructions (Addendum)
Return if any problems. Schedule to see the Gi doctor for evaluation  

## 2022-12-17 NOTE — ED Triage Notes (Signed)
Pain with right flank pain that moves around to his back.  Vomiting started today and has been non stop since about 1530 today.  Has had a few mild episodes of same since first of the year.  Had his gallbladder tested recently per wife and stated that was okay.  Pt also had had diarrhea. No fever/chills.  Pain moves into epigastric area.

## 2022-12-17 NOTE — ED Provider Notes (Signed)
[name] EMERGENCY DEPARTMENT AT [facility] Provider Note   CSN: [id number] Arrival date & time: 12/17/22  1817     History  Chief Complaint  Patient presents with   Flank Pain   Emesis    Scott Moss is a 47 y.o. male.  Complains of severe right-sided upper abdominal pain and right flank pain.  Patient reports he began vomiting today at 3:00.  Patient has recently had an ultrasound and nuclear medicine scan for evaluation of his gallbladder which were normal.  Patient denies any fever or chills he denies any diarrhea.  Patient reports he has been having multiple episodes of abdominal pain recently in the same area  The history is provided by the patient. No language interpreter was used.  Flank Pain This is a new problem. The problem occurs constantly. Pertinent negatives include no chest pain. Nothing aggravates the symptoms. Nothing relieves the symptoms. He has tried nothing for the symptoms. The treatment provided no relief.  Emesis      Home Medications Prior to Admission medications   Medication Sig Start Date End Date Taking? Authorizing Provider  ondansetron (ZOFRAN-ODT) 4 MG disintegrating tablet Take 1 tablet (4 mg total) by mouth every 8 (eight) hours as needed for nausea or vomiting. 12/17/22  Yes Elson Areas, PA-C  acetaminophen (TYLENOL) 500 MG tablet Take 500 mg by mouth every 6 (six) hours as needed for mild pain.    [provider]  aspirin EC (ASPIRIN LOW DOSE) 81 MG tablet TAKE 1 TABLET(81 MG) BY MOUTH DAILY. SWALLOW WHOLE 08/16/22   Tysinger, Kermit Balo, PA-C  famotidine (PEPCID) 20 MG tablet Take 1 tablet (20 mg total) by mouth daily. 08/16/22   Tysinger, Kermit Balo, PA-C  fexofenadine (ALLEGRA) 180 MG tablet Take 180 mg by mouth daily.    [provider]  omeprazole (PRILOSEC) 40 MG capsule TAKE 1 CAPSULE(40 MG) BY MOUTH DAILY 12/16/22   Tysinger, Kermit Balo, PA-C  oxybutynin (DITROPAN) 5 MG tablet TAKE 1 TABLET(5 MG) BY MOUTH TWICE  DAILY 08/16/22   Tysinger, Kermit Balo, PA-C  rosuvastatin (CRESTOR) 10 MG tablet TAKE 1 TABLET(10 MG) BY MOUTH DAILY 11/11/22   Tysinger, Kermit Balo, PA-C  Semaglutide-Weight Management (WEGOVY) 0.5 MG/0.5ML SOAJ Inject 0.5 mg into the skin once a week. 11/04/22   Tollie Eth, NP  Semaglutide-Weight Management (WEGOVY) 1 MG/0.5ML SOAJ Inject 1 mg into the skin once a week. Patient not taking: Reported on 12/11/2022 12/03/22   Tysinger, Kermit Balo, PA-C  valsartan-hydrochlorothiazide (DIOVAN HCT) 160-12.5 MG tablet Take 1 tablet by mouth daily. 08/16/22   Tysinger, Kermit Balo, PA-C      Allergies    Benicar [olmesartan]    Review of Systems   Review of Systems  Cardiovascular:  Negative for chest pain.  Gastrointestinal:  Positive for vomiting.  Genitourinary:  Positive for flank pain.  All other systems reviewed and are negative.   Physical Exam Updated Vital Signs BP 124/88 (BP Location: Right Arm)   Pulse 96   Temp 97.6 F (36.4 C) (Oral)   Resp 18   SpO2 97%  Physical Exam Vitals and nursing note reviewed.  Constitutional:      Appearance: He is well-developed.  HENT:     Head: Normocephalic.     Mouth/Throat:     Mouth: Mucous membranes are moist.  Eyes:     Pupils: Pupils are equal, round, and reactive to light.  Cardiovascular:     Rate and Rhythm: Normal  rate.  Pulmonary:     Effort: Pulmonary effort is normal.  Abdominal:     General: There is no distension.     Palpations: Abdomen is soft.     Tenderness: There is abdominal tenderness.  Musculoskeletal:        General: Normal range of motion.     Cervical back: Normal range of motion.  Skin:    General: Skin is warm.  Neurological:     General: No focal deficit present.     Mental Status: He is alert and oriented to person, place, and time.     ED Results / Procedures / Treatments   Labs (all labs ordered are listed, but only abnormal results are displayed) Labs Reviewed  CBC - Abnormal; Notable for the following  components:      Result Value   WBC 15.3 (*)    All other components within normal limits  URINALYSIS, ROUTINE W REFLEX MICROSCOPIC - Abnormal; Notable for the following components:   Ketones, ur 20 (*)    All other components within normal limits  LIPASE, BLOOD  COMPREHENSIVE METABOLIC PANEL    EKG None  Radiology CT ABDOMEN PELVIS W CONTRAST  Result Date: 12/17/2022 CLINICAL DATA:  Right flank pain. EXAM: CT ABDOMEN AND PELVIS WITH CONTRAST TECHNIQUE: Multidetector CT imaging of the abdomen and pelvis was performed using the standard protocol following bolus administration of intravenous contrast. RADIATION DOSE REDUCTION: This exam was performed according to the departmental dose-optimization program which includes automated exposure control, adjustment of the mA and/or kV according to patient size and/or use of iterative reconstruction technique. CONTRAST:  OMNIPAQUE IOHEXOL 300 MG/ML  SOLN COMPARISON:  Dec 24, 2021 FINDINGS: Lower chest: No acute abnormality. Hepatobiliary: No focal liver abnormality is seen. No gallstones, gallbladder wall thickening, or biliary dilatation. Pancreas: Unremarkable. No pancreatic ductal dilatation or surrounding inflammatory changes. Spleen: Normal in size without focal abnormality. Adrenals/Urinary Tract: Adrenal glands are unremarkable. Kidneys are normal in size, without renal calculi or hydronephrosis. A 2.4 cm x 1.6 cm simple cyst is seen within the medial aspect of the mid left kidney. The urinary bladder is limited in evaluation secondary to overlying streak artifact and is otherwise unremarkable. Stomach/Bowel: Stomach is within normal limits. The appendix is surgically absent no evidence of bowel wall thickening, distention, or inflammatory changes. Vascular/Lymphatic: Aortic atherosclerosis. No enlarged abdominal or pelvic lymph nodes. Reproductive: Prostate is unremarkable. Other: No abdominal wall hernia or abnormality. No abdominopelvic  ascites. Musculoskeletal: Bilateral hip replacements are seen with associated streak artifact and subsequently limited evaluation of the adjacent osseous and soft tissue structures. No acute osseous abnormalities are identified. IMPRESSION: 1. No acute or active process within the abdomen or pelvis. 2. Bilateral hip replacements. 3. Aortic atherosclerosis. Aortic Atherosclerosis (ICD10-I70.0). Electronically Signed   By: Aram Candela M.D.   On: 12/17/2022 20:39    Procedures Procedures    Medications Ordered in ED Medications  HYDROmorphone (DILAUDID) injection 1 mg (1 mg Intravenous Given 12/17/22 1947)  ondansetron (ZOFRAN) injection 4 mg (4 mg Intravenous Given 12/17/22 1947)  iohexol (OMNIPAQUE) 300 MG/ML solution 100 mL (100 mLs Intravenous Contrast Given 12/17/22 2015)    ED Course/ Medical Decision Making/ A&P                             Medical Decision Making Patient complains of right upper quadrant abdominal pain and pain in his right flank  Amount and/or Complexity of  Data Reviewed Independent Historian: spouse    Details: Patient is here with his wife who is supportive External Data Reviewed: notes.    Details: Primary care notes reviewed Ultrasound report  Labs: ordered. Decision-making details documented in ED Course.    Details: Labs ordered reviewed and interpreted patient is with elevated white blood cell count of 15.3.  Patient's lipase is normal. Radiology: ordered.    Details: CT scan abdomen and pelvis shows no acute abnormality  Risk Prescription drug management. Risk Details: Patient reports pain relief with Zofran and Dilaudid.  Patient is counseled on results he has been given a prescription for Zofran.  Advised patient to call and schedule follow-up with a gastroenterologist due to his recurring abdominal pain.  Pt is advised to discuss possible side effects of Wegovy with his primary care physician           Final Clinical Impression(s) / ED  Diagnoses Final diagnoses:  Generalized abdominal pain    Rx / DC Orders ED Discharge Orders          Ordered    ondansetron (ZOFRAN-ODT) 4 MG disintegrating tablet  Every 8 hours PRN        12/17/22 2247           An After Visit Summary was printed and given to the patient.    Osie Cheeks 12/17/22 2252    Cathren Laine, MD 12/18/22 [phone]

## 2022-12-18 ENCOUNTER — Encounter (INDEPENDENT_AMBULATORY_CARE_PROVIDER_SITE_OTHER): Payer: Self-pay

## 2022-12-24 ENCOUNTER — Encounter (HOSPITAL_COMMUNITY): Admission: RE | Admit: 2022-12-24 | Payer: BC Managed Care – PPO | Source: Ambulatory Visit

## 2022-12-25 ENCOUNTER — Telehealth: Payer: Self-pay | Admitting: Family Medicine

## 2022-12-25 DIAGNOSIS — R1031 Right lower quadrant pain: Secondary | ICD-10-CM

## 2022-12-25 DIAGNOSIS — R1011 Right upper quadrant pain: Secondary | ICD-10-CM

## 2022-12-25 DIAGNOSIS — R112 Nausea with vomiting, unspecified: Secondary | ICD-10-CM

## 2022-12-25 DIAGNOSIS — R195 Other fecal abnormalities: Secondary | ICD-10-CM

## 2022-12-25 NOTE — Telephone Encounter (Signed)
Scott Moss is working on stat order.  Per Vincenza Hews note, if he was not better then we can refer him back to GI, I do not see pt ever called back until today about a GI appt. I have put in STAT referral to GI.   Pt was notified that this is being done and would call when we schedule can get an appt

## 2022-12-25 NOTE — Telephone Encounter (Signed)
Pt called not doing well.  He had so much pain this morning couldn't barely get out of bed.  Infection somewhere as WBC 16. The antibiotics and other meds not doing anything.  He is not taking Wygovey.  CT scan showed nothing.  He needs to see GI soon and wants status on the referral.  Please call pt [phone]

## 2022-12-25 NOTE — Telephone Encounter (Signed)
Vincenza Hews- Per [employer] when she called [facility], they said he has to go the ED or Urgent care, their nurses are already aware of his situation and they don't have anything available.  Pt did call another GI office and since he has been seeing [facility] he had to go back there. Please advise. Not sure if you can call and speak with a provider there.

## 2022-12-26 ENCOUNTER — Other Ambulatory Visit: Payer: Self-pay | Admitting: Medical

## 2022-12-26 MED ORDER — SUCRALFATE 1 G PO TABS: 1.0000 g | ORAL_TABLET | Freq: Three times a day (TID) | ORAL | 0 refills | Status: DC

## 2022-12-26 MED ORDER — ONDANSETRON 4 MG PO TBDP: 4.0000 mg | ORAL_TABLET | Freq: Three times a day (TID) | ORAL | 0 refills | Status: DC | PRN

## 2022-12-26 NOTE — Telephone Encounter (Signed)
I called and advised patient of message below. Patient states he will run out of the antibiotic today. Yesterday the pain woke him up from his sleep (right side pain). Patient says the diarrhea ad vomiting has resolved, but the pain is severe at times which makes him nauseous. Pain comes an goes randomly. Patient says he did stop the [facility], but these symptoms started before he was prescribed Wegovy. Symptoms started in February, and patient started taking Wegovy in March.

## 2022-12-31 NOTE — Telephone Encounter (Signed)
Patient is rescheduled for an office visit with Quentin Mulling, PA on 01/11/23 at 8:30. Patient made aware.

## 2023-01-03 NOTE — Progress Notes (Unsigned)
01/06/2023 Scott Moss [id number] 1975/10/16  Referring provider: Jac Canavan, PA-C Primary GI doctor: Dr. Barron Alvine  ASSESSMENT AND PLAN:   47 year old male with right upper quadrant/flank intermittent discomfort since February associate with diarrhea, nausea, and vomiting, started [facility] in April has stopped without any help in his symptoms.   He does have history of GERD, occ dysphagia to even liquids.  Has had nocturnal symptoms waking him up, persistent leukocytosis. Unremarkable CT abdomen pelvis, abdominal ultrasound abdomen fatty liver, negative HIDA. Colonoscopy unremarkable 2022, no biopsies done Will check for H. pylori and C. difficile/infection in the stool with [facility] Will start on hyoscyamine, continue Zofran. Avoid milk products. Will plan on checking celiac, CRP and sed rate. Will plan for upper endoscopy with possible dilatation with Dr. Barron Alvine at the [facility]. I discussed risks of EGD with patient today, including risk of sedation, bleeding or perforation.  Patient provides understanding and gave verbal consent to proceed. Pending lab findings may want to discuss with Dr. Barron Alvine repeat colonoscopy versus flex sigmoidoscopy taken biopsies. If endoscopy unremarkable will likely need to consider barium swallow with abnormal swallowing liquids. If everything is unremarkable question sphincter of Oddi dysfunction with episodic right upper quadrant pain nausea vomiting.  Fatty liver --Continue to work on risk factor modification including diet exercise and control of risk factors including blood sugars. - monitor q 6 months.      Patient Care Team: Tysinger, Kermit Balo, PA-C as PCP - General (Family Medicine)  HISTORY OF PRESENT ILLNESS: 47 y.o. male with a past medical history of hypertension, fatty liver, obesity, hyperlipidemia, history of testicular cancer and others listed below presents for evaluation of vomiting.   03/09/2021 colonoscopy with  good prep diverticula, otherwise unremarkable no specimens collected recall 10 years 09/2022 AB [place] fatty liver 12/13/2022 HIDA negative 12/17/2022 ER visit for generalized abdominal pain, flank pain and emesis CBC showed leukocytosis 15.3, no anemia normal platelets lipase negative, normal kidney liver CT abdomen pelvis with contrast showed no acute or active process within abdomen or pelvis, bilateral hip replacement aortic atherosclerosis.,  Urine with ketones but negative nitrates, leukocytes, protein, and blood.  Patient was recently started on Wegovy in April.  Symptoms started in Feb, he started to have RUQ/flank pain associated with diarrhea, worse with eating.  His WBC was elevated at that time and sent in ABX with some help.  Then restarted in March.  He states he has been having issues once a month, RUQ pain associated with diarrhea and most recent in May was having emesis.  Most recent episode was the worse and went to the ER.   States within 5 mins of eating anything will have RUQ pain into his flank/lower back, will last for 10-15 mins and start to relax.  States has had it woke him up at 3 AM, with Ab pain, nausea, and diarrhea.  Having diarrhea 3-4 times a day, no melena no hematochezia, has had weight loss with wegovy went from 234 down to 229.  Has not taken for 2 weeks, still having issues.  Has had GERD since chemotherapy, on omeprazole just started in the past month, and pepcid has been on for several years.  Intermittent trouble swallowing, occ has difficult swallowing water.  No fever, chills.  Mom with history of gallstones.  Patient denies family history of colon cancer or other gastrointestinal malignancies. He denies blood thinner use.  He reports NSAID use, rare once a month or less. He reports ETOH use,  1-2 beers 2-3 x a week.  He denies tobacco use.  He denies drug use.    He  reports that he has never smoked. He has never been exposed to tobacco smoke. He has  never used smokeless tobacco. He reports current alcohol use of about 3.0 standard drinks of alcohol per week. He reports that he does not use drugs.  RELEVANT LABS AND IMAGING: CBC    Component Value Date/Time   WBC 15.3 (H) 12/17/2022 1855   RBC 5.48 12/17/2022 1855   HGB 16.6 12/17/2022 1855   HGB 15.5 09/10/2022 0835   HGB 16.4 06/20/2014 1026   HGB 16.4 06/04/2007 0755   HCT 49.1 12/17/2022 1855   HCT 45.2 09/10/2022 0835   HCT 47.1 06/20/2014 1026   HCT 46.3 06/04/2007 0755   PLT 320 12/17/2022 1855   PLT 330 09/10/2022 0835   MCV 89.6 12/17/2022 1855   MCV 89 09/10/2022 0835   MCV 87 06/20/2014 1026   MCV 88.2 06/04/2007 0755   MCH 30.3 12/17/2022 1855   MCHC 33.8 12/17/2022 1855   RDW 13.0 12/17/2022 1855   RDW 13.7 09/10/2022 0835   RDW 11.9 06/20/2014 1026   RDW 13.0 06/04/2007 0755   LYMPHSABS 1.7 09/10/2022 0835   LYMPHSABS 2.1 06/20/2014 1026   LYMPHSABS 1.4 06/04/2007 0755   MONOABS 0.8 12/29/2021 1143   MONOABS 0.4 06/04/2007 0755   EOSABS 0.1 09/10/2022 0835   EOSABS 0.2 06/20/2014 1026   BASOSABS 0.0 09/10/2022 0835   BASOSABS 0.1 06/20/2014 1026   BASOSABS 0.0 06/04/2007 0755   Recent Labs    08/16/22 1408 09/10/22 0835 12/17/22 1855  HGB 16.4 15.5 16.6    CMP     Component Value Date/Time   NA 135 12/17/2022 1855   NA 139 09/10/2022 0835   K 3.8 12/17/2022 1855   CL 101 12/17/2022 1855   CO2 24 12/17/2022 1855   GLUCOSE 92 12/17/2022 1855   BUN 18 12/17/2022 1855   BUN 12 09/10/2022 0835   CREATININE 1.04 12/17/2022 1855   CREATININE 1.02 02/06/2017 0659   CALCIUM 8.9 12/17/2022 1855   PROT 8.1 12/17/2022 1855   PROT 6.4 09/10/2022 0835   ALBUMIN 4.5 12/17/2022 1855   ALBUMIN 4.3 09/10/2022 0835   AST 26 12/17/2022 1855   ALT 35 12/17/2022 1855   ALKPHOS 86 12/17/2022 1855   BILITOT 0.8 12/17/2022 1855   BILITOT 0.6 09/10/2022 0835   GFRNONAA >60 12/17/2022 1855   GFRAA 106 12/16/2019 1106      Latest Ref Rng & Units  12/17/2022    6:55 PM 09/10/2022    8:35 AM 08/16/2022    2:08 PM  Hepatic Function  Total Protein 6.5 - 8.1 g/dL 8.1  6.4  7.5   Albumin 3.5 - 5.0 g/dL 4.5  4.3  4.8   AST 15 - 41 U/L 26  24  39   ALT 0 - 44 U/L 35  49  47   Alk Phosphatase 38 - 126 U/L 86  94  98   Total Bilirubin 0.3 - 1.2 mg/dL 0.8  0.6  0.7       Latest Ref Rng & Units 04/09/2013    9:14 AM 04/08/2014    7:51 AM 06/20/2014   10:26 AM  Hepatitis C  AFP <6.1 ng/mL 0.0 - 8.0 ng/mL 1.3  1.7  1.9    <1.3     Current Medications:    Current Outpatient Medications (Cardiovascular):  rosuvastatin (CRESTOR) 10 MG tablet, TAKE 1 TABLET(10 MG) BY MOUTH DAILY   valsartan-hydrochlorothiazide (DIOVAN HCT) 160-12.5 MG tablet, Take 1 tablet by mouth daily.  Current Outpatient Medications (Respiratory):    fexofenadine (ALLEGRA) 180 MG tablet, Take 180 mg by mouth daily.  Current Outpatient Medications (Analgesics):    acetaminophen (TYLENOL) 500 MG tablet, Take 500 mg by mouth every 6 (six) hours as needed for mild pain.   aspirin EC (ASPIRIN LOW DOSE) 81 MG tablet, TAKE 1 TABLET(81 MG) BY MOUTH DAILY. SWALLOW WHOLE   Current Outpatient Medications (Other):    famotidine (PEPCID) 20 MG tablet, Take 1 tablet (20 mg total) by mouth daily.   hyoscyamine (LEVSIN SL) 0.125 MG SL tablet, Place 1 tablet (0.125 mg total) under the tongue every 6 (six) hours as needed for cramping (nausea, diarrhea).   omeprazole (PRILOSEC) 40 MG capsule, TAKE 1 CAPSULE(40 MG) BY MOUTH DAILY   ondansetron (ZOFRAN-ODT) 4 MG disintegrating tablet, Take 1 tablet (4 mg total) by mouth every 8 (eight) hours as needed for nausea or vomiting.   oxybutynin (DITROPAN) 5 MG tablet, TAKE 1 TABLET(5 MG) BY MOUTH TWICE DAILY   Semaglutide-Weight Management (WEGOVY) 1 MG/0.5ML SOAJ, Inject 1 mg into the skin once a week. (Patient not taking: Reported on 12/11/2022)  Medical History:  Past Medical History:  Diagnosis Date   Arthritis    Atypical chest pain  07/06/2015   Non-obstructive CAD on [facility] 04/2010.   Chondromalacia of left knee 09/2012   Complication of anesthesia    Family history of cancer    multiple family members, multiple types.   Sees Dr. Myna Hidalgo   Family history of cancer    Family history of premature CAD    GERD (gastroesophageal reflux disease)    H/O cardiovascular stress test 07/14/2015   myocardial perfusion scan, normal, EF 45-54% mildy reduced LV function, Dr. Duke Salvia   H/O echocardiogram 07/18/2015   LV mild hypertrophy, 55-60%EF, Dr. Duke Salvia   High cholesterol    Hypertension    Obesity    Overweight    PONV (postoperative nausea and vomiting)    Testicular cancer (HCC) 02/2005   Dr. Myna Hidalgo   Allergies:  Allergies  Allergen Reactions   Benicar [Olmesartan]     Rash, allergic reaction     Surgical History:  He  has a past surgical history that includes Radical orchiectomy (02/07/2005); Anterior cervical decomp/discectomy fusion (11/11/2008); Knee arthroscopy (Right); Appendectomy; Cardiac catheterization (04/13/2010); Knee arthroscopy with lateral release (Left, 09/18/2012); Cervical spine surgery (08/05/2013); Colonoscopy; Nasal sinus surgery (08/05/2012); Knee arthroscopy (Left, 10/19/2015); Partial knee arthroplasty (Left, 08/15/2016); Hip Arthroplasty (Left, 03/2020); and Hip Arthroplasty (Right, 04/2020). Family History:  His family history includes Cancer in his father and mother; Cancer (age of onset: 6) in his sister; Colon cancer in his maternal uncle; Diabetes in his maternal uncle; Esophageal cancer in his maternal uncle; Heart attack in his maternal uncle and paternal uncle; Heart attack (age of onset: 36) in his father; Heart disease in his father; Hypertension in his father, mother, and sister; Lung cancer in his father; Migraines in his mother and sister; Ovarian cancer in his mother; Skin cancer in his father; Stroke in his mother.  REVIEW OF SYSTEMS  : All other systems reviewed and negative  except where noted in the History of Present Illness.  PHYSICAL EXAM: BP 122/74   Pulse 77   Ht 5\' 9"  (1.753 m)   Wt 228 lb (103.4 kg)   BMI 33.67 kg/m  General  Appearance: Well nourished, in no apparent distress. Head:   Normocephalic and atraumatic. Eyes:  sclerae anicteric,conjunctive pink  Respiratory: Respiratory effort normal, BS equal bilaterally without rales, rhonchi, wheezing. Cardio: RRR with no MRGs. Peripheral pulses intact.  Abdomen: Soft,  Non-distended ,active bowel sounds. Mild to mod tenderness in the RUQ and in the right flank, no rash, negative murphysWith guarding and Without rebound. No masses. Rectal: Not evaluated Musculoskeletal: Full ROM, Normal gait. Without edema. Skin:  Dry and intact without significant lesions or rashes Neuro: Alert and  oriented x4;  No focal deficits. Psych:  Cooperative. Normal mood and affect.    Doree Albee, PA-C 8:49 AM

## 2023-01-06 ENCOUNTER — Ambulatory Visit: Payer: BC Managed Care – PPO | Admitting: Physician Assistant

## 2023-01-06 ENCOUNTER — Encounter: Payer: Self-pay | Admitting: Physician Assistant

## 2023-01-06 ENCOUNTER — Other Ambulatory Visit (INDEPENDENT_AMBULATORY_CARE_PROVIDER_SITE_OTHER): Payer: BC Managed Care – PPO

## 2023-01-06 VITALS — BP 122/74 | HR 77 | Ht 69.0 in | Wt 228.0 lb

## 2023-01-06 DIAGNOSIS — R1011 Right upper quadrant pain: Secondary | ICD-10-CM

## 2023-01-06 DIAGNOSIS — K760 Fatty (change of) liver, not elsewhere classified: Secondary | ICD-10-CM | POA: Diagnosis not present

## 2023-01-06 DIAGNOSIS — K219 Gastro-esophageal reflux disease without esophagitis: Secondary | ICD-10-CM

## 2023-01-06 DIAGNOSIS — R1319 Other dysphagia: Secondary | ICD-10-CM

## 2023-01-06 DIAGNOSIS — R112 Nausea with vomiting, unspecified: Secondary | ICD-10-CM | POA: Diagnosis not present

## 2023-01-06 DIAGNOSIS — A09 Infectious gastroenteritis and colitis, unspecified: Secondary | ICD-10-CM

## 2023-01-06 LAB — COMPREHENSIVE METABOLIC PANEL
ALT: 27 U/L (ref 0–53)
AST: 23 U/L (ref 0–37)
Albumin: 4.6 g/dL (ref 3.5–5.2)
Alkaline Phosphatase: 83 U/L (ref 39–117)
BUN: 15 mg/dL (ref 6–23)
CO2: 26 mEq/L (ref 19–32)
Calcium: 9.3 mg/dL (ref 8.4–10.5)
Chloride: 103 mEq/L (ref 96–112)
Creatinine, Ser: 0.95 mg/dL (ref 0.40–1.50)
GFR: 94.61 mL/min (ref >60.00)
Glucose, Bld: 96 mg/dL (ref 70–99)
Potassium: 3.9 mEq/L (ref 3.5–5.1)
Sodium: 138 mEq/L (ref 135–145)
Total Bilirubin: 0.5 mg/dL (ref 0.2–1.2)
Total Protein: 7.5 g/dL (ref 6.0–8.3)

## 2023-01-06 LAB — CBC WITH DIFFERENTIAL/PLATELET
Basophils Absolute: 0 10*3/uL (ref 0.0–0.1)
Basophils Relative: 0.6 % (ref 0.0–3.0)
Eosinophils Absolute: 0.4 10*3/uL (ref 0.0–0.7)
Eosinophils Relative: 6.9 % — ABNORMAL HIGH (ref 0.0–5.0)
HCT: 47.1 % (ref 39.0–52.0)
Hemoglobin: 15.5 g/dL (ref 13.0–17.0)
Lymphocytes Relative: 25.8 % (ref 12.0–46.0)
Lymphs Abs: 1.5 10*3/uL (ref 0.7–4.0)
MCHC: 32.9 g/dL (ref 30.0–36.0)
MCV: 90.3 fl (ref 78.0–100.0)
Monocytes Absolute: 0.6 10*3/uL (ref 0.1–1.0)
Monocytes Relative: 10.7 % (ref 3.0–12.0)
Neutro Abs: 3.3 10*3/uL (ref 1.4–7.7)
Neutrophils Relative %: 56 % (ref 43.0–77.0)
Platelets: 316 10*3/uL (ref 150.0–400.0)
RBC: 5.21 Mil/uL (ref 4.22–5.81)
RDW: 14 % (ref 11.5–15.5)
WBC: 5.9 10*3/uL (ref 4.0–10.5)

## 2023-01-06 LAB — HIGH SENSITIVITY CRP: CRP, High Sensitivity: 0.95 mg/L (ref 0.000–5.000)

## 2023-01-06 LAB — TSH: TSH: 0.49 u[IU]/mL (ref 0.35–5.50)

## 2023-01-06 LAB — SEDIMENTATION RATE: Sed Rate: 7 mm/hr (ref 0–15)

## 2023-01-06 MED ORDER — HYOSCYAMINE SULFATE 0.125 MG SL SUBL: 0.1250 mg | SUBLINGUAL_TABLET | Freq: Four times a day (QID) | SUBLINGUAL | 1 refills | Status: DC | PRN

## 2023-01-06 NOTE — Patient Instructions (Addendum)
Your provider has requested that you go to the basement level for lab work before leaving today. Press "B" on the elevator. The lab is located at the first door on the left as you exit the elevator.  - Drink a lot of liquids that have water, salt, and sugar. Good choices are water mixed with juice, flavored soda, and soup broth. If you are drinking enough, your urine will be light yellow or almost clear.  - Try to eat a little food. Good choices are potatoes, noodles, rice, oatmeal, crackers, bananas, soup, and boiled vegetables.  - Avoid high fat foods, as they can make diarrhea worse.  - Dairy products (except yogurt) may be difficult to digest when you have diarrhea. I recommend that you temporarily avoid lactose-containing foods.  - Can try loperamide 4 mg initially, then 2 mg after each unformed stool for =2 days, with a maximum of 16 mg/day.  - If loperamide is not working, you could try bismuth salicylate (Pepto-Bismol) 30 mL or two tablets every 30 minutes for eight doses. Pepto-Bismol may make your stools black.   Go to the ER if any severe abdominal pain, fever, or weakness   Your provider has ordered "[facility]" stool testing for you. You have received a kit from our office today containing all necessary supplies to complete this test. Please carefully read the stool collection instructions provided in the kit before opening the accompanying materials. In addition, be sure to place the label from the top left corner of the laboratory request sheet onto the "puritan opti-swab" tube that is supplied in the kit. This label should include your full name and date of birth. After completing the test, you should secure the purtian tube into the specimen biohazard bag. The laboratory request information sheet (including date and time of specimen collection) should be placed into the outside pocket of the specimen biohazard bag and returned to the [facility] lab with 2 days of collection.    _______________________________________________________  If your blood pressure at your visit was 140/90 or greater, please contact your primary care physician to follow up on this.  _______________________________________________________  If you are age 95 or older, your body mass index should be between 23-30. Your Body mass index is 33.67 kg/m. If this is out of the aforementioned range listed, please consider follow up with your Primary Care Provider.  If you are age 98 or younger, your body mass index should be between 19-25. Your Body mass index is 33.67 kg/m. If this is out of the aformentioned range listed, please consider follow up with your Primary Care Provider.   ________________________________________________________  The [facility] providers would like to encourage you to use [facility] to communicate with providers for non-urgent requests or questions.  Due to long hold times on the telephone, sending your provider a message by [facility] may be a faster and more efficient way to get a response.  Please allow 48 business hours for a response.  Please remember that this is for non-urgent requests.  _______________________________________________________ It was a pleasure to see you today!  Thank you for trusting me with your gastrointestinal care!

## 2023-01-07 LAB — IGA: Immunoglobulin A: 238 mg/dL (ref 47–310)

## 2023-01-07 LAB — TISSUE TRANSGLUTAMINASE, IGA: (tTG) Ab, IgA: <1 U/mL

## 2023-01-08 ENCOUNTER — Telehealth: Payer: Self-pay | Admitting: Physician Assistant

## 2023-01-08 NOTE — Telephone Encounter (Signed)
[facility] labs called regarding patients recent lab results. Stated results have been faxed over. Please advise, thank you.

## 2023-01-09 ENCOUNTER — Telehealth: Payer: Self-pay

## 2023-01-09 DIAGNOSIS — A09 Infectious gastroenteritis and colitis, unspecified: Secondary | ICD-10-CM

## 2023-01-09 DIAGNOSIS — R112 Nausea with vomiting, unspecified: Secondary | ICD-10-CM

## 2023-01-09 DIAGNOSIS — R1011 Right upper quadrant pain: Secondary | ICD-10-CM

## 2023-01-09 DIAGNOSIS — K219 Gastro-esophageal reflux disease without esophagitis: Secondary | ICD-10-CM

## 2023-01-09 MED ORDER — SUTAB 1479-225-188 MG PO TABS: ORAL_TABLET | ORAL | 0 refills | Status: DC

## 2023-01-09 NOTE — Progress Notes (Signed)
Agree with the assessment and plan as outlined by Quentin Mulling, PA-C.  Given the degree of reported anemia, if labs unremarkable, I agree that adding a colonoscopy to be done on the same day as EGD would be reasonable, with random and directed biopsies to evaluate for IBD, Microscopic Colitis, etc.  Doristine Locks, DO, [facility]

## 2023-01-09 NOTE — Telephone Encounter (Signed)
Noted  

## 2023-01-09 NOTE — Telephone Encounter (Signed)
-----   Message from [facility] V, DO sent at 01/09/2023  1:37 PM EDT ----- With the normal labs, I think it would be reasonable to get a repeat colonoscopy with biopsies done on the same day as his scheduled EGD. ----- Message ----- From: Doree Albee, PA-C Sent: 01/06/2023   9:00 AM EDT To: Shellia Cleverly, DO  He is having diarrhea too, may need flex sig or colon for biopsies pending these labs.

## 2023-01-09 NOTE — Telephone Encounter (Signed)
Rescheduled procedure on 01/22/23 due to add on colonoscopy. Patient made aware. Sutab prep sent to patient's pharmacy. Instruction sent to Via MyChart and mail. Advised patient to call us with questions. Stated he just had this procedure done on 2022 with Sutab, and he feels comfortable. I went over instruction over the phone. Patient verbalized understanding. Ambulatory referral placed in EPIC.

## 2023-01-13 ENCOUNTER — Telehealth: Payer: Self-pay | Admitting: Physician Assistant

## 2023-01-13 DIAGNOSIS — A0472 Enterocolitis due to Clostridium difficile, not specified as recurrent: Secondary | ICD-10-CM

## 2023-01-13 MED ORDER — VANCOMYCIN HCL 125 MG PO CAPS: 125.0000 mg | ORAL_CAPSULE | Freq: Four times a day (QID) | ORAL | 0 refills | Status: AC

## 2023-01-13 NOTE — Telephone Encounter (Signed)
Colon is cancelled/

## 2023-01-13 NOTE — Telephone Encounter (Signed)
Diatherx came back with positive Cdiff toxin B gene, otherwise negative, negative H pylori.   Will need to cancel colonoscopy, likely this is the cause of his diarrhea. Discussed with patient, can continue with EGD only.   -Patient with positive Cdiff on labs, will send in vancomycin 125gm 4 x a day for 14 days #56, add on over the counter florastor 250mg  BID for 1 month.  -Please remind patient of hand washing with soap, wipe down surfaces to avoid spread.   -Follow up 4 weeks in the office for evaluation, call sooner if symptoms not improving or symptoms return quickly - Continue to hydrate, monitor fluid volume status and replace electrolytes as needed. -Go to the ER if bloody stools,not peeing regularly, unable to take oral fluids, vomiting, high fever, severe weakness, abdominal pain, shortness of breath or chest pain.

## 2023-01-22 ENCOUNTER — Other Ambulatory Visit: Payer: Self-pay | Admitting: Gastroenterology

## 2023-01-22 ENCOUNTER — Encounter: Payer: Self-pay | Admitting: Gastroenterology

## 2023-01-22 ENCOUNTER — Ambulatory Visit (AMBULATORY_SURGERY_CENTER): Payer: BC Managed Care – PPO | Admitting: Gastroenterology

## 2023-01-22 VITALS — BP 142/69 | HR 75 | Temp 98.2°F | Resp 14 | Ht 69.0 in | Wt 228.0 lb

## 2023-01-22 DIAGNOSIS — K267 Chronic duodenal ulcer without hemorrhage or perforation: Secondary | ICD-10-CM | POA: Diagnosis not present

## 2023-01-22 DIAGNOSIS — R197 Diarrhea, unspecified: Secondary | ICD-10-CM

## 2023-01-22 DIAGNOSIS — R112 Nausea with vomiting, unspecified: Secondary | ICD-10-CM

## 2023-01-22 DIAGNOSIS — K2100 Gastro-esophageal reflux disease with esophagitis, without bleeding: Secondary | ICD-10-CM

## 2023-01-22 DIAGNOSIS — K222 Esophageal obstruction: Secondary | ICD-10-CM

## 2023-01-22 DIAGNOSIS — K2980 Duodenitis without bleeding: Secondary | ICD-10-CM | POA: Diagnosis not present

## 2023-01-22 DIAGNOSIS — K3189 Other diseases of stomach and duodenum: Secondary | ICD-10-CM

## 2023-01-22 DIAGNOSIS — R1011 Right upper quadrant pain: Secondary | ICD-10-CM

## 2023-01-22 DIAGNOSIS — R1319 Other dysphagia: Secondary | ICD-10-CM

## 2023-01-22 DIAGNOSIS — K449 Diaphragmatic hernia without obstruction or gangrene: Secondary | ICD-10-CM

## 2023-01-22 MED ORDER — PANTOPRAZOLE SODIUM 40 MG PO TBEC: DELAYED_RELEASE_TABLET | ORAL | 3 refills | Status: DC

## 2023-01-22 MED ORDER — SODIUM CHLORIDE 0.9 % IV SOLN
500.0000 mL | INTRAVENOUS | Status: DC
Start: 2023-01-22 — End: 2023-01-22

## 2023-01-22 NOTE — Progress Notes (Signed)
To pacu, VSS. Report to [email or web address].tb 

## 2023-01-22 NOTE — Progress Notes (Signed)
Called to room to assist during endoscopic procedure.  Patient ID and intended procedure confirmed with present staff. Received instructions for my participation in the procedure from the performing physician.  

## 2023-01-22 NOTE — Patient Instructions (Signed)
Please read handouts provided. Continue present medications. Await pathology results. Soft diet today then advanced as tolerated to regular diet. Protonix ( pantoprazole ) 40 mg twice daily for 4 weeks then reduce to 40 mg daily. Return to GI clinic at appointment to be scheduled.   YOU HAD AN ENDOSCOPIC PROCEDURE TODAY AT THE [facility]:   Refer to the procedure report that was given to you for any specific questions about what was found during the examination.  If the procedure report does not answer your questions, please call your gastroenterologist to clarify.  If you requested that your care partner not be given the details of your procedure findings, then the procedure report has been included in a sealed envelope for you to review at your convenience later.  YOU SHOULD EXPECT: Some feelings of bloating in the abdomen. Passage of more gas than usual.  Walking can help get rid of the air that was put into your GI tract during the procedure and reduce the bloating. If you had a lower endoscopy (such as a colonoscopy or flexible sigmoidoscopy) you may notice spotting of blood in your stool or on the toilet paper. If you underwent a bowel prep for your procedure, you may not have a normal bowel movement for a few days.  Please Note:  You might notice some irritation and congestion in your nose or some drainage.  This is from the oxygen used during your procedure.  There is no need for concern and it should clear up in a day or so.  SYMPTOMS TO REPORT IMMEDIATELY:  Following upper endoscopy (EGD)  Vomiting of blood or coffee ground material  New chest pain or pain under the shoulder blades  Painful or persistently difficult swallowing  New shortness of breath  Fever of 100F or higher  Black, tarry-looking stools  For urgent or emergent issues, a gastroenterologist can be reached at any hour by calling [phone]. Do not use MyChart messaging for urgent concerns.     DIET:  We do recommend a small meal at first, but then you may proceed to your regular diet.  Drink plenty of fluids but you should avoid alcoholic beverages for 24 hours.  ACTIVITY:  You should plan to take it easy for the rest of today and you should NOT DRIVE or use heavy machinery until tomorrow (because of the sedation medicines used during the test).    FOLLOW UP: Our staff will call the number listed on your records the next business day following your procedure.  We will call around 7:15- 8:00 am to check on you and address any questions or concerns that you may have regarding the information given to you following your procedure. If we do not reach you, we will leave a message.     If any biopsies were taken you will be contacted by phone or by letter within the next 1-3 weeks.  Please call us at [phone] if you have not heard about the biopsies in 3 weeks.    SIGNATURES/CONFIDENTIALITY: You and/or your care partner have signed paperwork which will be entered into your electronic medical record.  These signatures attest to the fact that that the information above on your After Visit Summary has been reviewed and is understood.  Full responsibility of the confidentiality of this discharge information lies with you and/or your care-partner.

## 2023-01-22 NOTE — Progress Notes (Signed)
GASTROENTEROLOGY PROCEDURE H&P NOTE   Primary Care Physician: Jac Canavan, PA-C    Reason for Procedure:   RUQ pain, diarrhea, nausea/vomiting, GERD, dysphagia  Plan:    EGD  Patient is appropriate for endoscopic procedure(s) in the ambulatory ([facility]) setting.  The nature of the procedure, as well as the risks, benefits, and alternatives were carefully and thoroughly reviewed with the patient. Ample time for discussion and questions allowed. The patient understood, was satisfied, and agreed to proceed.     HPI: Scott Moss is a 47 y.o. male who presents for EGD for evaluation of RUQ pain, n/v, dysphagia, diarrhea, and known hx of GERD .  Patient was most recently seen in the Gastroenterology Clinic on 01/06/2023 by Quentin Mulling. Recent C diff posiutive and treated with vancomycin and florastor.   Normal CBC, CMP, TSH, Celiac labs, and ESR/CRP.  Otherwise, no interval change in medical history since that appointment. Please refer to that note for full details regarding GI history and clinical presentation.      Latest Ref Rng & Units 01/06/2023    9:03 AM 12/17/2022    6:55 PM 09/10/2022    8:35 AM  CBC  WBC 4.0 - 10.5 K/uL 5.9  15.3  5.4   Hemoglobin 13.0 - 17.0 g/dL 16.1  09.6  04.5   Hematocrit 39.0 - 52.0 % 47.1  49.1  45.2   Platelets 150.0 - 400.0 K/uL 316.0  320  330       Latest Ref Rng & Units 01/06/2023    9:03 AM 12/17/2022    6:55 PM 09/10/2022    8:35 AM  CMP  Glucose 70 - 99 mg/dL 96  92  99   BUN 6 - 23 mg/dL 15  18  12    Creatinine 0.40 - 1.50 mg/dL 4.09  8.11  9.14   Sodium 135 - 145 mEq/L 138  135  139   Potassium 3.5 - 5.1 mEq/L 3.9  3.8  4.2   Chloride 96 - 112 mEq/L 103  101  104   CO2 19 - 32 mEq/L 26  24  25    Calcium 8.4 - 10.5 mg/dL 9.3  8.9  8.9   Total Protein 6.0 - 8.3 g/dL 7.5  8.1  6.4   Total Bilirubin 0.2 - 1.2 mg/dL 0.5  0.8  0.6   Alkaline Phos 39 - 117 U/L 83  86  94   AST 0 - 37 U/L 23  26  24    ALT 0 - 53 U/L 27  35  49       Past Medical History:  Diagnosis Date   Allergy    Arthritis    Atypical chest pain 07/06/2015   Non-obstructive CAD on [facility] 04/2010.   Chondromalacia of left knee 09/2012   Complication of anesthesia    Family history of cancer    multiple family members, multiple types.   Sees Dr. Myna Hidalgo   Family history of cancer    Family history of premature CAD    GERD (gastroesophageal reflux disease)    H/O cardiovascular stress test 07/14/2015   myocardial perfusion scan, normal, EF 45-54% mildy reduced LV function, Dr. Duke Salvia   H/O echocardiogram 07/18/2015   LV mild hypertrophy, 55-60%EF, Dr. Duke Salvia   High cholesterol    Hypertension    Obesity    Overweight    PONV (postoperative nausea and vomiting)    Testicular cancer (HCC) 02/2005   Dr. Myna Hidalgo  Past Surgical History:  Procedure Laterality Date   ANTERIOR CERVICAL DECOMP/DISCECTOMY FUSION  11/11/2008   C5-6   APPENDECTOMY     CARDIAC CATHETERIZATION  04/13/2010   no disease   CERVICAL SPINE SURGERY  08/05/2013   Dr. Lelon Perla   COLONOSCOPY     prior, no problems per patient, year unknown, possibly in early 30s.   HIP ARTHROPLASTY Left 03/2020   HIP ARTHROPLASTY Right 04/2020   KNEE ARTHROSCOPY Right    KNEE ARTHROSCOPY Left 10/19/2015   Procedure: LEFT KNEE SCOPE CHONDROPLASTY;  Surgeon: Loreta Ave, MD;  Location: [facility];  Service: Orthopedics;  Laterality: Left;   KNEE ARTHROSCOPY WITH LATERAL RELEASE Left 09/18/2012   Procedure: KNEE ARTHROSCOPY WITH LATERAL RELEASE;  Surgeon: Loreta Ave, MD;  Location: [facility];  Service: Orthopedics;  Laterality: Left;  WITH DEBRIDEMENT AND SHAVING(CHONDROPLASTY), Excision Medial [place]   NASAL SINUS SURGERY  08/05/2012   PARTIAL KNEE ARTHROPLASTY Left 08/15/2016   Procedure: PATELLA FEMORAL REPLACEMENT LEFT KNEE;  Surgeon: Loreta Ave, MD;  Location: [facility];  Service: Orthopedics;  Laterality: Left;    RADICAL ORCHIECTOMY  02/07/2005   left; with left testicular implant    Prior to Admission medications   Medication Sig Start Date End Date Taking? Authorizing Provider  acetaminophen (TYLENOL) 500 MG tablet Take 500 mg by mouth every 6 (six) hours as needed for mild pain.   Yes [provider]  aspirin EC (ASPIRIN LOW DOSE) 81 MG tablet TAKE 1 TABLET(81 MG) BY MOUTH DAILY. SWALLOW WHOLE 08/16/22  Yes Tysinger, Kermit Balo, PA-C  famotidine (PEPCID) 20 MG tablet Take 1 tablet (20 mg total) by mouth daily. 08/16/22  Yes Tysinger, Kermit Balo, PA-C  fexofenadine (ALLEGRA) 180 MG tablet Take 180 mg by mouth daily.   Yes [provider]  rosuvastatin (CRESTOR) 10 MG tablet TAKE 1 TABLET(10 MG) BY MOUTH DAILY 11/11/22  Yes Tysinger, Kermit Balo, PA-C  valsartan-hydrochlorothiazide (DIOVAN HCT) 160-12.5 MG tablet Take 1 tablet by mouth daily. 08/16/22  Yes Tysinger, Kermit Balo, PA-C  vancomycin (VANCOCIN) 125 MG capsule Take 1 capsule (125 mg total) by mouth 4 (four) times daily for 14 days. 01/13/23 01/27/23 Yes Doree Albee, PA-C  hyoscyamine (LEVSIN SL) 0.125 MG SL tablet Place 1 tablet (0.125 mg total) under the tongue every 6 (six) hours as needed for cramping (nausea, diarrhea). 01/06/23   Doree Albee, PA-C  ondansetron (ZOFRAN-ODT) 4 MG disintegrating tablet Take 1 tablet (4 mg total) by mouth every 8 (eight) hours as needed for nausea or vomiting. 12/26/22   Tysinger, Kermit Balo, PA-C  oxybutynin (DITROPAN) 5 MG tablet TAKE 1 TABLET(5 MG) BY MOUTH TWICE DAILY 08/16/22   Tysinger, Kermit Balo, PA-C    Current Outpatient Medications  Medication Sig Dispense Refill   acetaminophen (TYLENOL) 500 MG tablet Take 500 mg by mouth every 6 (six) hours as needed for mild pain.     aspirin EC (ASPIRIN LOW DOSE) 81 MG tablet TAKE 1 TABLET(81 MG) BY MOUTH DAILY. SWALLOW WHOLE 90 tablet 3   famotidine (PEPCID) 20 MG tablet Take 1 tablet (20 mg total) by mouth daily. 180 tablet 3   fexofenadine (ALLEGRA) 180 MG  tablet Take 180 mg by mouth daily.     rosuvastatin (CRESTOR) 10 MG tablet TAKE 1 TABLET(10 MG) BY MOUTH DAILY 90 tablet 1   valsartan-hydrochlorothiazide (DIOVAN HCT) 160-12.5 MG tablet Take 1 tablet by mouth daily. 90 tablet 3  vancomycin (VANCOCIN) 125 MG capsule Take 1 capsule (125 mg total) by mouth 4 (four) times daily for 14 days. 56 capsule 0   hyoscyamine (LEVSIN SL) 0.125 MG SL tablet Place 1 tablet (0.125 mg total) under the tongue every 6 (six) hours as needed for cramping (nausea, diarrhea). 50 tablet 1   ondansetron (ZOFRAN-ODT) 4 MG disintegrating tablet Take 1 tablet (4 mg total) by mouth every 8 (eight) hours as needed for nausea or vomiting. 20 tablet 0   oxybutynin (DITROPAN) 5 MG tablet TAKE 1 TABLET(5 MG) BY MOUTH TWICE DAILY 180 tablet 1   Current Facility-Administered Medications  Medication Dose Route Frequency Provider Last Rate Last Admin   0.9 %  sodium chloride infusion  500 mL Intravenous Continuous Koula Venier V, DO        Allergies as of 01/22/2023 - Review Complete 01/22/2023  Allergen Reaction Noted   Benicar [olmesartan]  01/19/2015    Family History  Problem Relation Age of Onset   Ovarian cancer Mother        uterine, mets   Hypertension Mother    Stroke Mother    Migraines Mother    Cancer Mother    Lung cancer Father    Hypertension Father    Heart attack Father 9   Skin cancer Father        ?   Heart disease Father    Cancer Father        lung   Cancer Sister 74       multiple myeloma   Hypertension Sister    Migraines Sister    Diabetes Maternal Uncle    Colon cancer Maternal Uncle    Esophageal cancer Maternal Uncle    Heart attack Maternal Uncle    Heart attack Paternal Uncle    Stomach cancer Neg Hx    Colon polyps Neg Hx    Rectal cancer Neg Hx     Social History   Socioeconomic History   Marital status: Married    Spouse name: Not on file   Number of children: 2   Years of education: Not on file   Highest  education level: Not on file  Occupational History   Occupation: truck driver  Tobacco Use   Smoking status: Never    Passive exposure: Never   Smokeless tobacco: Never   Tobacco comments:    never used tobacco  Vaping Use   Vaping Use: Never used  Substance and Sexual Activity   Alcohol use: Yes    Alcohol/week: 3.0 standard drinks of alcohol    Types: 3 Cans of beer per week    Comment: social   Drug use: No   Sexual activity: Yes  Other Topics Concern   Not on file  Social History Narrative   Married, 2 children, is a Naval architect for [employer].   08/2022      Social Determinants of Health   Financial Resource Strain: Not on file  Food Insecurity: Not on file  Transportation Needs: Not on file  Physical Activity: Not on file  Stress: Not on file  Social Connections: Not on file  Intimate Partner Violence: Not on file    Physical Exam: Vital signs in last 24 hours: @BP  138/84   Pulse 74   Temp 98.2 F (36.8 C) (Skin)   Ht 5\' 9"  (1.753 m)   Wt 228 lb (103.4 kg)   SpO2 97%   BMI 33.67 kg/m  GEN: NAD EYE: Sclerae anicteric ENT:  MMM CV: Non-tachycardic Pulm: CTA b/l GI: Soft, NT/ND NEURO:  Alert & Oriented x 3   Doristine Locks, DO [facility]   01/22/2023 2:35 PM

## 2023-01-22 NOTE — Op Note (Signed)
[facility] Patient Name: Scott Moss Procedure Date: 01/22/2023 2:29 PM MRN: [id number] Endoscopist: Doristine Locks , MD, [id number] Age: 47 Referring MD:  Date of Birth: [birth year] Gender: Male Account #: [id number] Procedure:                Upper GI endoscopy Indications:              Abdominal pain in the right upper quadrant,                            Dysphagia, Esophageal reflux, Diarrhea Medicines:                Monitored Anesthesia Care Procedure:                Pre-Anesthesia Assessment:                           - Prior to the procedure, a History and Physical                            was performed, and patient medications and                            allergies were reviewed. The patient's tolerance of                            previous anesthesia was also reviewed. The risks                            and benefits of the procedure and the sedation                            options and risks were discussed with the patient.                            All questions were answered, and informed consent                            was obtained. Prior Anticoagulants: The patient has                            taken no anticoagulant or antiplatelet agents. ASA                            Grade Assessment: II - A patient with mild systemic                            disease. After reviewing the risks and benefits,                            the patient was deemed in satisfactory condition to                            undergo the procedure.  After obtaining informed consent, the endoscope was                            passed under direct vision. Throughout the                            procedure, the patient's blood pressure, pulse, and                            oxygen saturations were monitored continuously. The                            Olympus scope [id number] was introduced through the                            mouth, and advanced to  the third part of duodenum.                            The upper GI endoscopy was accomplished without                            difficulty. The patient tolerated the procedure                            well. Scope In: Scope Out: Findings:                 One benign-appearing, intrinsic mild stenosis was                            found 40 cm from the incisors. This stenosis                            measured 1 cm (in length). The stenosis was                            traversed. A TTS dilator was passed through the                            scope. Dilation with an 18-19-20 mm balloon dilator                            was performed to 20 mm. The dilation site was                            examined and showed mild mucosal disruption.                            Biopsies were then taken with a cold forceps for                            dual purposes of further fracturing of the ring and  histology. Estimated blood loss was minimal.                           LA Grade A (one or more mucosal breaks less than 5                            mm, not extending between tops of 2 mucosal folds)                            esophagitis with no bleeding was found in the lower                            third of the esophagus.                           A 3 cm sliding type hiatal hernia was present.                           The entire examined stomach was normal. Biopsies                            were taken with a cold forceps for Helicobacter                            pylori testing. Estimated blood loss was minimal.                           A few localized erosions without bleeding and                            localized erythema were found in the duodenal bulb.                            Biopsies were taken with a cold forceps for                            histology. Estimated blood loss was minimal.                           The second portion of the duodenum and  third                            portion of the duodenum were normal. Biopsies for                            histology were taken with a cold forceps for                            evaluation of celiac disease. Estimated blood loss                            was minimal. Complications:            No  immediate complications. Estimated Blood Loss:     Estimated blood loss was minimal. Impression:               - Benign-appearing esophageal stenosis. Dilated                            with 20 mm TTS balloon then fractured with forceps.                            Biopsied.                           - LA Grade A reflux esophagitis with no bleeding.                           - 3 cm hiatal hernia.                           - Normal stomach. Biopsied.                           - Duodenal erosions without bleeding and localized                            erythema was found in the duodenal bulb. Biopsied.                           - Normal second portion of the duodenum and third                            portion of the duodenum. Biopsied. Recommendation:           - Patient has a contact number available for                            emergencies. The signs and symptoms of potential                            delayed complications were discussed with the                            patient. Return to normal activities tomorrow.                            Written discharge instructions were provided to the                            patient.                           - Soft diet today, then advance as tolerated per                            post dilation protocol.                           -  Await pathology results.                           - Use Protonix (pantoprazole) 40 mg PO BID for 4                            weeks to promote mucosal healing, then reduce to 40                            mg daily.                           - Return to GI clinic at appointment to be                             scheduled. Doristine Locks, MD 01/22/2023 3:04:20 PM

## 2023-01-22 NOTE — Progress Notes (Signed)
Patient states there have been no changes to medical or surgical history since time of pre-visit. 

## 2023-01-23 ENCOUNTER — Telehealth: Payer: Self-pay | Admitting: *Deleted

## 2023-01-23 NOTE — Telephone Encounter (Signed)
  Follow up Call-     01/22/2023    1:40 PM 03/09/2021   10:06 AM  Call back number  Post procedure Call Back phone  # [phone]  Permission to leave phone message Yes Yes     Patient questions:  Do you have a fever, pain , or abdominal swelling? yes Pain Score  3  States that "it was bad last night."  Did not call anybody.  Ok now."  Have you tolerated food without any problems? Yes.    Have you been able to return to your normal activities? Yes.    Do you have any questions about your discharge instructions: Diet   No. Medications  No. Follow up visit  No.  Do you have questions or concerns about your Care? Yes.    Actions: * If pain score is 4 or above: No action needed, pain <4.

## 2023-01-24 ENCOUNTER — Encounter: Payer: BC Managed Care – PPO | Admitting: Gastroenterology

## 2023-01-29 ENCOUNTER — Other Ambulatory Visit: Payer: Self-pay

## 2023-01-29 MED ORDER — FIDAXOMICIN 200 MG PO TABS: 200.0000 mg | ORAL_TABLET | Freq: Two times a day (BID) | ORAL | 0 refills | Status: DC

## 2023-01-29 NOTE — Telephone Encounter (Unsigned)
I have spoken to patient who says that he continues to have frequent liquid stools along with RLQ abdominal pain that radiates into the ribcage. He denies any blood in the stool. Patient stats that he does not feel that the diarrhea has improved at all since finishing the vancomycin and the hyoscyamine for cramping has also been ineffective. I advised that we will send dificid to his pharmacy for him to take twice daily x 12 days. Advised he should let us know if symptoms continue to be unimproved after several days of this medication. In addition, advised patient of ER precautions. He verbalizes understanding of this information.

## 2023-02-19 ENCOUNTER — Ambulatory Visit: Payer: BC Managed Care – PPO | Admitting: Gastroenterology

## 2023-02-28 ENCOUNTER — Ambulatory Visit (HOSPITAL_BASED_OUTPATIENT_CLINIC_OR_DEPARTMENT_OTHER): Payer: BC Managed Care – PPO | Admitting: Student

## 2023-02-28 ENCOUNTER — Encounter: Payer: Self-pay | Admitting: Nurse Practitioner

## 2023-02-28 ENCOUNTER — Ambulatory Visit (HOSPITAL_BASED_OUTPATIENT_CLINIC_OR_DEPARTMENT_OTHER): Payer: BC Managed Care – PPO

## 2023-02-28 ENCOUNTER — Ambulatory Visit (INDEPENDENT_AMBULATORY_CARE_PROVIDER_SITE_OTHER): Payer: BC Managed Care – PPO | Admitting: Nurse Practitioner

## 2023-02-28 ENCOUNTER — Encounter (HOSPITAL_BASED_OUTPATIENT_CLINIC_OR_DEPARTMENT_OTHER): Payer: Self-pay | Admitting: Student

## 2023-02-28 VITALS — BP 112/68 | HR 75 | Wt 236.8 lb

## 2023-02-28 DIAGNOSIS — G8929 Other chronic pain: Secondary | ICD-10-CM | POA: Diagnosis not present

## 2023-02-28 DIAGNOSIS — R600 Localized edema: Secondary | ICD-10-CM | POA: Insufficient documentation

## 2023-02-28 DIAGNOSIS — M25462 Effusion, left knee: Secondary | ICD-10-CM

## 2023-02-28 DIAGNOSIS — M25562 Pain in left knee: Secondary | ICD-10-CM

## 2023-02-28 DIAGNOSIS — M79605 Pain in left leg: Secondary | ICD-10-CM | POA: Insufficient documentation

## 2023-02-28 DIAGNOSIS — M1712 Unilateral primary osteoarthritis, left knee: Secondary | ICD-10-CM | POA: Diagnosis not present

## 2023-02-28 NOTE — Assessment & Plan Note (Signed)
Significant swelling of the left lower extremity from the thigh to the knee most notable. There is no erythema present or pinpoint tenderness, which is reassuring, however, I am concerned at the pain and pressure he is experiencing about the knee and the recurrent nature of this. I would prefer for him to be seen by orthopedics for further evaluation to ensure no issues with septic arthritis or other etiology presenting. I was able to reach Dr. Steward Drone with [facility] at [facility] and they can see him in the walk-in clinic today.

## 2023-02-28 NOTE — Patient Instructions (Signed)
[address] 2nd Floor- Ortho Care

## 2023-02-28 NOTE — Progress Notes (Signed)
Chief Complaint: Left knee pain and swelling     History of Present Illness:    Scott Moss is a 47 y.o. male referred to our clinic by Enid Skeens, NP for evaluation of pain and swelling of his left knee.  He does have a history of a patellofemoral partial knee replacement back in 2018 with Dr Richardson Landry.  Patient states that he began having swelling in the knee 2 weeks ago without any known injury.  His knee has become swollen and stiff as result, and it hurts to bend.  This has been keeping him up at night.  He has had swelling intermittently on and off for the past few years and had his knee drained 3 times previously.  Denies redness, fever, or chills.  Has been taking ibuprofen which has helped minimally.  He works as a Medical laboratory scientific officer.   Surgical History:   Left partial knee replacement 2018  PMH/PSH/Family History/Social History/Meds/Allergies:    Past Medical History:  Diagnosis Date   Allergy    Arthritis    Atypical chest pain 07/06/2015   Non-obstructive CAD on [facility] 04/2010.   Chondromalacia of left knee 09/2012   Complication of anesthesia    Family history of cancer    multiple family members, multiple types.   Sees Dr. Myna Hidalgo   Family history of cancer    Family history of premature CAD    GERD (gastroesophageal reflux disease)    H/O cardiovascular stress test 07/14/2015   myocardial perfusion scan, normal, EF 45-54% mildy reduced LV function, Dr. Duke Salvia   H/O echocardiogram 07/18/2015   LV mild hypertrophy, 55-60%EF, Dr. Duke Salvia   High cholesterol    Hypertension    Obesity    Overweight    PONV (postoperative nausea and vomiting)    Testicular cancer (HCC) 02/2005   Dr. Myna Hidalgo   Past Surgical History:  Procedure Laterality Date   ANTERIOR CERVICAL DECOMP/DISCECTOMY FUSION  11/11/2008   C5-6   APPENDECTOMY     CARDIAC CATHETERIZATION  04/13/2010   no disease   CERVICAL SPINE SURGERY   08/05/2013   Dr. Lelon Perla   COLONOSCOPY     prior, no problems per patient, year unknown, possibly in early 30s.   HIP ARTHROPLASTY Left 03/2020   HIP ARTHROPLASTY Right 04/2020   KNEE ARTHROSCOPY Right    KNEE ARTHROSCOPY Left 10/19/2015   Procedure: LEFT KNEE SCOPE CHONDROPLASTY;  Surgeon: Loreta Ave, MD;  Location: [facility];  Service: Orthopedics;  Laterality: Left;   KNEE ARTHROSCOPY WITH LATERAL RELEASE Left 09/18/2012   Procedure: KNEE ARTHROSCOPY WITH LATERAL RELEASE;  Surgeon: Loreta Ave, MD;  Location: [facility];  Service: Orthopedics;  Laterality: Left;  WITH DEBRIDEMENT AND SHAVING(CHONDROPLASTY), Excision Medial Plica   NASAL SINUS SURGERY  08/05/2012   PARTIAL KNEE ARTHROPLASTY Left 08/15/2016   Procedure: PATELLA FEMORAL REPLACEMENT LEFT KNEE;  Surgeon: Loreta Ave, MD;  Location:  SURGERY CENTER;  Service: Orthopedics;  Laterality: Left;   RADICAL ORCHIECTOMY  02/07/2005   left; with left testicular implant   Social History   Socioeconomic History   Marital status: Married    Spouse name: Not on file   Number of children: 2   Years of education: Not on file   Highest  education level: Not on file  Occupational History   Occupation: truck driver  Tobacco Use   Smoking status: Never    Passive exposure: Never   Smokeless tobacco: Never   Tobacco comments:    never used tobacco  Vaping Use   Vaping status: Never Used  Substance and Sexual Activity   Alcohol use: Yes    Alcohol/week: 3.0 standard drinks of alcohol    Types: 3 Cans of beer per week    Comment: social   Drug use: No   Sexual activity: Yes  Other Topics Concern   Not on file  Social History Narrative   Married, 2 children, is a Naval architect for [employer].   08/2022      Social Determinants of Health   Financial Resource Strain: Not on file  Food Insecurity: Not on file  Transportation Needs: Not on file  Physical Activity: Not on  file  Stress: Not on file  Social Connections: Not on file   Family History  Problem Relation Age of Onset   Ovarian cancer Mother        uterine, mets   Hypertension Mother    Stroke Mother    Migraines Mother    Cancer Mother    Lung cancer Father    Hypertension Father    Heart attack Father 63   Skin cancer Father        ?   Heart disease Father    Cancer Father        lung   Cancer Sister 38       multiple myeloma   Hypertension Sister    Migraines Sister    Diabetes Maternal Uncle    Colon cancer Maternal Uncle    Esophageal cancer Maternal Uncle    Heart attack Maternal Uncle    Heart attack Paternal Uncle    Stomach cancer Neg Hx    Colon polyps Neg Hx    Rectal cancer Neg Hx    Allergies  Allergen Reactions   Benicar [Olmesartan]     Rash, allergic reaction   Current Outpatient Medications  Medication Sig Dispense Refill   acetaminophen (TYLENOL) 500 MG tablet Take 500 mg by mouth every 6 (six) hours as needed for mild pain.     aspirin EC (ASPIRIN LOW DOSE) 81 MG tablet TAKE 1 TABLET(81 MG) BY MOUTH DAILY. SWALLOW WHOLE 90 tablet 3   famotidine (PEPCID) 20 MG tablet Take 1 tablet (20 mg total) by mouth daily. 180 tablet 3   fexofenadine (ALLEGRA) 180 MG tablet Take 180 mg by mouth daily.     fidaxomicin (DIFICID) 200 MG TABS tablet Take 1 tablet (200 mg total) by mouth 2 (two) times daily. 24 tablet 0   hyoscyamine (LEVSIN SL) 0.125 MG SL tablet Place 1 tablet (0.125 mg total) under the tongue every 6 (six) hours as needed for cramping (nausea, diarrhea). (Patient not taking: Reported on 02/28/2023) 50 tablet 1   ondansetron (ZOFRAN-ODT) 4 MG disintegrating tablet Take 1 tablet (4 mg total) by mouth every 8 (eight) hours as needed for nausea or vomiting. (Patient not taking: Reported on 02/28/2023) 20 tablet 0   oxybutynin (DITROPAN) 5 MG tablet TAKE 1 TABLET(5 MG) BY MOUTH TWICE DAILY 180 tablet 1   pantoprazole (PROTONIX) 40 MG tablet Protonix 40 mg twice  daily then reduce to 40 mg daily. 90 tablet 3   rosuvastatin (CRESTOR) 10 MG tablet TAKE 1 TABLET(10 MG) BY MOUTH DAILY 90 tablet 1  valsartan-hydrochlorothiazide (DIOVAN HCT) 160-12.5 MG tablet Take 1 tablet by mouth daily. 90 tablet 3   No current facility-administered medications for this visit.   No results found.  Review of Systems:   A ROS was performed including pertinent positives and negatives as documented in the HPI.  Physical Exam :   Constitutional: NAD and appears stated age Neurological: Alert and oriented Psych: Appropriate affect and cooperative There were no vitals taken for this visit.   Comprehensive Musculoskeletal Exam:    No erythema or obvious deformity over the left knee.  There is swelling with a large effusion and palpable.  Mild generalized warmth.  No medial or lateral joint line tenderness.  Range of motion from 0 to 100 degrees limited by stiffness.  No instability to varus or valgus stress.  No redness or tenderness of the calf.  Neurosensory exam is intact.  Imaging:   Xray (left knee 4 views) Normal appearing patellofemoral replacement component. Joint effusion present.  Assessment:   47 y.o. male with history of left patellofemoral partial knee replacement presenting with atraumatic swelling and stiffness.  On exam he does have a significant effusion, so I have recommended aspiration which I believe will help with symptoms.  This was a large effusion as I successfully aspirated 72 mL of clear synovial fluid without complication.  Based on appearance of his knee as well as the fluid, I do have low suspicion for gout or septic arthritis.  Patient reports that after previous aspirations he has gone long periods of relief.  Should fluid return acutely, I would likely consider sending for further analysis.  Otherwise can plan to see him back as needed.  Plan :    -Successful aspiration of left knee today -Return to clinic as needed    Procedure  Note  Patient: Scott Moss             Date of Birth: 12-25-1975           MRN: [id number]             Visit Date: 02/28/2023  Procedures: Visit Diagnoses:  1. Effusion, left knee   2. Chronic pain of left knee     Large Joint Inj: L knee on 02/28/2023 4:43 PM Indications: pain and joint swelling Details: 22 G 1.5 in needle, superolateral approach Aspirate: 72 mL clear and serous Outcome: tolerated well, no immediate complications Consent was given by the patient. Immediately prior to procedure a time out was called to verify the correct patient, procedure, equipment, support staff and site/side marked as required. Patient was prepped and draped in the usual sterile fashion.       I personally saw and evaluated the patient, and participated in the management and treatment plan.  Hazle Nordmann, PA-C Orthopedics  This document was dictated using Conservation officer, historic buildings. A reasonable attempt at proof reading has been made to minimize errors.

## 2023-02-28 NOTE — Progress Notes (Signed)
  Tollie Eth, DNP, AGNP-c [facility] [address] [place], [place] [address] [phone]   ACUTE VISIT- ESTABLISHED PATIENT  Blood pressure 112/68, pulse 75, weight 236 lb 12.8 oz (107.4 kg).  Subjective:  HPI KAYODE KORNFELD is a 47 y.o. male presents to day for evaluation of: Left leg swelling.  Renae Fickle tells me he has had left leg swelling on and off since having surgery on his knee and hips, but over the past two weeks he has experienced significant swelling in the left knee and thigh. The swelling is now extending down into the calf. He tells me there is significant pressure behind the knee cap and "it feels like it is going to bust". He has been taking NSAIDs to help with the pain and walking the floors. He has had the knee swell in the past, but has not had any swelling all the way up into the thigh as it is right now. This is the first time he has had swelling since he had his hip replaced. He is not sure if that has anything to do with that.    PMH, Medications, and Allergies reviewed and updated in chart as appropriate.   ROS negative except for what is listed in HPI. Objective:  Physical Exam Vitals and nursing note reviewed.  Constitutional:      Appearance: Normal appearance.  Eyes:     Conjunctiva/sclera: Conjunctivae normal.  Cardiovascular:     Rate and Rhythm: Normal rate and regular rhythm.     Heart sounds: Normal heart sounds.  Musculoskeletal:        General: Swelling and tenderness present.     Left lower leg: Tenderness present. Edema present.     Comments: Significant edema of the left thigh and knee with generalized warmth present. No erythema noted.   Skin:    General: Skin is warm and dry.     Capillary Refill: Capillary refill takes less than 2 seconds.  Neurological:     General: No focal deficit present.     Mental Status: He is alert.  Psychiatric:        Mood and Affect: Mood normal.         Assessment & Plan:   Problem  List Items Addressed This Visit     Pain of left lower extremity - Primary   Relevant Orders   Ambulatory referral to Orthopedic Surgery   Edema leg    Significant swelling of the left lower extremity from the thigh to the knee most notable. There is no erythema present or pinpoint tenderness, which is reassuring, however, I am concerned at the pain and pressure he is experiencing about the knee and the recurrent nature of this. I would prefer for him to be seen by orthopedics for further evaluation to ensure no issues with septic arthritis or other etiology presenting. I was able to reach Dr. Steward Drone with Cyn[facility] Skeeters at [facility] and they can see him in the walk-in clinic today.          Time: 10 minutes, >50% spent counseling, care coordination, chart review, and documentation.    Tollie Eth, DNP, AGNP-c   History, Medications, Surgery, SDOH, and Family History reviewed and updated as appropriate.

## 2023-03-07 ENCOUNTER — Ambulatory Visit (HOSPITAL_BASED_OUTPATIENT_CLINIC_OR_DEPARTMENT_OTHER): Payer: BC Managed Care – PPO | Admitting: Student

## 2023-03-28 ENCOUNTER — Encounter: Payer: Self-pay | Admitting: Medical

## 2023-03-28 ENCOUNTER — Ambulatory Visit: Payer: BC Managed Care – PPO | Admitting: Medical

## 2023-03-28 ENCOUNTER — Other Ambulatory Visit (INDEPENDENT_AMBULATORY_CARE_PROVIDER_SITE_OTHER): Payer: BC Managed Care – PPO

## 2023-03-28 VITALS — BP 110/72 | HR 80 | Temp 97.1°F | Wt 235.0 lb

## 2023-03-28 DIAGNOSIS — J029 Acute pharyngitis, unspecified: Secondary | ICD-10-CM

## 2023-03-28 DIAGNOSIS — R051 Acute cough: Secondary | ICD-10-CM

## 2023-03-28 DIAGNOSIS — H7393 Unspecified disorder of tympanic membrane, bilateral: Secondary | ICD-10-CM

## 2023-03-28 DIAGNOSIS — J988 Other specified respiratory disorders: Secondary | ICD-10-CM

## 2023-03-28 DIAGNOSIS — H6690 Otitis media, unspecified, unspecified ear: Secondary | ICD-10-CM | POA: Diagnosis not present

## 2023-03-28 LAB — POC COVID19 BINAXNOW: SARS Coronavirus 2 Ag: NEGATIVE

## 2023-03-28 LAB — POCT INFLUENZA A/B
Influenza A, POC: NEGATIVE
Influenza B, POC: NEGATIVE

## 2023-03-28 MED ORDER — PSEUDOEPHEDRINE HCL 30 MG PO TABS: ORAL_TABLET | ORAL | 0 refills | Status: DC

## 2023-03-28 MED ORDER — PREDNISONE 20 MG PO TABS: 20.0000 mg | ORAL_TABLET | Freq: Every day | ORAL | 0 refills | Status: DC

## 2023-03-28 MED ORDER — AMOXICILLIN-POT CLAVULANATE 875-125 MG PO TABS: 1.0000 | ORAL_TABLET | Freq: Two times a day (BID) | ORAL | 0 refills | Status: DC

## 2023-03-28 NOTE — Progress Notes (Signed)
Subjective:  Scott Moss is a 47 y.o. male who presents for Chief Complaint  Patient presents with   URI    Ear pain, some sore throat, cough and drainage, symptoms started last Saturday, had some chills. Got really hot on Wednesday at job that [employer] [facility] [employer] [employer] said he turned purple     Here for illness. Started 6 days ago.   Has ear pain sore throat, cough, drainage, some chills.  Started in right ear.  Now both ears hurt.   No fever, no NVD,maybe a little SOB.   Cough is mild.  Has some headache in front between eyes.  Did some ibuprofen.  No sick contacts.  Using nothing for symptoms otherwise. No other aggravating or relieving factors.    No other c/o.  Past Medical History:  Diagnosis Date   Allergy    Arthritis    Atypical chest pain 07/06/2015   Non-obstructive CAD on [facility] 04/2010.   Chondromalacia of left knee 09/2012   Complication of anesthesia    Family history of cancer    multiple family members, multiple types.   Sees Dr. Myna Hidalgo   Family history of cancer    Family history of premature CAD    GERD (gastroesophageal reflux disease)    H/O cardiovascular stress test 07/14/2015   myocardial perfusion scan, normal, EF 45-54% mildy reduced LV function, Dr. Duke Salvia   H/O echocardiogram 07/18/2015   LV mild hypertrophy, 55-60%EF, Dr. Duke Salvia   High cholesterol    Hypertension    Obesity    Overweight    PONV (postoperative nausea and vomiting)    Testicular cancer (HCC) 02/2005   Dr. Myna Hidalgo   Current Outpatient Medications on File Prior to Visit  Medication Sig Dispense Refill   acetaminophen (TYLENOL) 500 MG tablet Take 500 mg by mouth every 6 (six) hours as needed for mild pain.     aspirin EC (ASPIRIN LOW DOSE) 81 MG tablet TAKE 1 TABLET(81 MG) BY MOUTH DAILY. SWALLOW WHOLE 90 tablet 3   famotidine (PEPCID) 20 MG tablet Take 1 tablet (20 mg total) by mouth daily. 180 tablet 3   fexofenadine (ALLEGRA) 180 MG tablet Take 180 mg by mouth daily.     oxybutynin (DITROPAN)  5 MG tablet TAKE 1 TABLET(5 MG) BY MOUTH TWICE DAILY 180 tablet 1   pantoprazole (PROTONIX) 40 MG tablet Protonix 40 mg twice daily then reduce to 40 mg daily. 90 tablet 3   rosuvastatin (CRESTOR) 10 MG tablet TAKE 1 TABLET(10 MG) BY MOUTH DAILY 90 tablet 1   valsartan-hydrochlorothiazide (DIOVAN HCT) 160-12.5 MG tablet Take 1 tablet by mouth daily. 90 tablet 3   No current facility-administered medications on file prior to visit.     The following portions of the patient's history were reviewed and updated as appropriate: allergies, current medications, past family history, past medical history, past social history, past surgical history and problem list.  ROS Otherwise as in subjective above    Objective: BP 110/72   Pulse 80   Temp (!) 97.1 F (36.2 C)   Wt 235 lb (106.6 kg)   SpO2 97%   BMI 34.70 kg/m   General appearance: alert, no distress, well developed, well nourished HEENT: normocephalic, sclerae anicteric, conjunctiva pink and moist, bilateral TMs with erythema, retraction, air-fluid levels, nares patent, no discharge or erythema, pharynx with mild erythema and uvula slightly swollen  Oral cavity: MMM, no lesions Neck: supple, no lymphadenopathy, no thyromegaly, no masses Heart: RRR, normal S1, S2, no murmurs  Lungs: CTA bilaterally, no wheezes, rhonchi, or rales    Assessment: Encounter Diagnoses  Name Primary?   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  Begin medications below.  Discussed risk and benefits of medications.  Advise he monitor blood pressures the next few days while on the Sudafed.  If elevated blood pressure cut back to half tablet twice daily on the Sudafed.  If not much improved over the next week then let me know.  I will have him follow-up with ENT given his TM findings similar to a year ago as  well  Scott "Renae Moss" was seen today for uri.  Diagnoses and all orders for this visit:  Acute otitis media, unspecified otitis media type -     Ambulatory referral to ENT  Respiratory tract infection -     Ambulatory referral to ENT  Acute cough -     Ambulatory referral to ENT  Disorder of tympanic membrane of both ears -     Ambulatory referral to ENT  Other orders -     pseudoephedrine (SUDAFED) 30 MG tablet; 1/2 -1 tablet twice daily for 5 days -     amoxicillin-clavulanate (AUGMENTIN) 875-125 MG tablet; Take 1 tablet by mouth 2 (two) times daily. -     predniSONE (DELTASONE) 20 MG tablet; Take 1 tablet (20 mg total) by mouth daily with breakfast.    Follow up: pending ENT

## 2023-05-01 ENCOUNTER — Ambulatory Visit: Payer: BC Managed Care – PPO | Admitting: Family Medicine

## 2023-05-01 ENCOUNTER — Ambulatory Visit (INDEPENDENT_AMBULATORY_CARE_PROVIDER_SITE_OTHER): Payer: BC Managed Care – PPO | Admitting: Otolaryngology

## 2023-05-01 ENCOUNTER — Encounter (INDEPENDENT_AMBULATORY_CARE_PROVIDER_SITE_OTHER): Payer: Self-pay | Admitting: Otolaryngology

## 2023-05-01 VITALS — BP 128/74 | HR 86 | Ht 69.0 in | Wt 231.0 lb

## 2023-05-01 DIAGNOSIS — H938X3 Other specified disorders of ear, bilateral: Secondary | ICD-10-CM | POA: Diagnosis not present

## 2023-05-01 DIAGNOSIS — H6993 Unspecified Eustachian tube disorder, bilateral: Secondary | ICD-10-CM

## 2023-05-01 DIAGNOSIS — H7391 Unspecified disorder of tympanic membrane, right ear: Secondary | ICD-10-CM | POA: Diagnosis not present

## 2023-05-01 DIAGNOSIS — H9193 Unspecified hearing loss, bilateral: Secondary | ICD-10-CM

## 2023-05-01 MED ORDER — OFLOXACIN 0.3 % OP SOLN: 4.0000 [drp] | Freq: Two times a day (BID) | OPHTHALMIC | 0 refills | Status: AC

## 2023-05-01 MED ORDER — FLUTICASONE PROPIONATE 50 MCG/ACT NA SUSP: 2.0000 | Freq: Two times a day (BID) | NASAL | 6 refills | Status: DC

## 2023-05-01 NOTE — Progress Notes (Signed)
Dear Dr. Aleen Campi, Here is my assessment for our mutual patient, Scott Moss. Thank you for allowing me the opportunity to care for your patient. Please do not hesitate to contact me should you have any other questions. Sincerely, Dr. Jovita Kussmaul  Otolaryngology Clinic Note Referring provider: Dr. Aleen Campi HPI:  Scott Moss is a 47 y.o. male kindly referred by Dr. Aleen Campi for evaluation of bilateral ear discomfort. He says he has had some bilateral ear discomfort for several months on and off. But most recently had URI type symptoms about a month ago and then had recurrence of discomfort. He reports that he has been feeling like there some fluid behind his ears, some muffed hearing. Right ear drained about 6 weeks ago (yellow ish stuff), but it stopped. He still has some discomfort, no drainage or vertigo. He reports these happen about 2-3 times per year, was diagnosed with ear infections.   He does report having ear surgery as a child (has a postauricular scar on left). None on right. Denies other eustachian tube dysfunction symptoms like symptoms with pressure changes. No popping/crackling. He does have some nasal congestion but no nasal symptoms. No hearing change.  He was placed on augmentin and prednisone by his PCP for these symptoms and he reports he is kind of back to normal now. No barotrauma, vestibular suppressant use.  Drives tractor trailer.  Most recent hearing test was years ago.   PMHx: Allergy (takes Zyrtec), denies CAD   PMH/Meds/All/SocHx/FamHx/ROS:   Past Medical History:  Diagnosis Date   Allergy    Arthritis    Atypical chest pain 07/06/2015   Non-obstructive CAD on [facility] 04/2010.   Chondromalacia of left knee 09/2012   Complication of anesthesia    Family history of cancer    multiple family members, multiple types.   Sees Dr. Myna Hidalgo   Family history of cancer    Family history of premature CAD    GERD (gastroesophageal reflux disease)    H/O cardiovascular  stress test 07/14/2015   myocardial perfusion scan, normal, EF 45-54% mildy reduced LV function, Dr. Duke Salvia   H/O echocardiogram 07/18/2015   LV mild hypertrophy, 55-60%EF, Dr. Duke Salvia   High cholesterol    Hypertension    Obesity    Overweight    PONV (postoperative nausea and vomiting)    Testicular cancer (HCC) 02/2005   Dr. Myna Hidalgo     Past Surgical History:  Procedure Laterality Date   ANTERIOR CERVICAL DECOMP/DISCECTOMY FUSION  11/11/2008   C5-6   APPENDECTOMY     CARDIAC CATHETERIZATION  04/13/2010   no disease   CERVICAL SPINE SURGERY  08/05/2013   Dr. Lelon Perla   COLONOSCOPY     prior, no problems per patient, year unknown, possibly in early 30s.   HIP ARTHROPLASTY Left 03/2020   HIP ARTHROPLASTY Right 04/2020   KNEE ARTHROSCOPY Right    KNEE ARTHROSCOPY Left 10/19/2015   Procedure: LEFT KNEE SCOPE CHONDROPLASTY;  Surgeon: Loreta Ave, MD;  Location: [facility];  Service: Orthopedics;  Laterality: Left;   KNEE ARTHROSCOPY WITH LATERAL RELEASE Left 09/18/2012   Procedure: KNEE ARTHROSCOPY WITH LATERAL RELEASE;  Surgeon: Loreta Ave, MD;  Location: [facility];  Service: Orthopedics;  Laterality: Left;  WITH DEBRIDEMENT AND SHAVING(CHONDROPLASTY), Excision Medial Plica   NASAL SINUS SURGERY  08/05/2012   PARTIAL KNEE ARTHROPLASTY Left 08/15/2016   Procedure: PATELLA FEMORAL REPLACEMENT LEFT KNEE;  Surgeon: Loreta Ave, MD;  Location: [facility];  Service: Orthopedics;  Laterality: Left;   RADICAL ORCHIECTOMY  02/07/2005   left; with left testicular implant   Family History  Problem Relation Age of Onset   Ovarian cancer Mother        uterine, mets   Hypertension Mother    Stroke Mother    Migraines Mother    Cancer Mother    Lung cancer Father    Hypertension Father    Heart attack Father 55   Skin cancer Father        ?   Heart disease Father    Cancer Father        lung   Cancer Sister 37        multiple myeloma   Hypertension Sister    Migraines Sister    Diabetes Maternal Uncle    Colon cancer Maternal Uncle    Esophageal cancer Maternal Uncle    Heart attack Maternal Uncle    Heart attack Paternal Uncle    Stomach cancer Neg Hx    Colon polyps Neg Hx    Rectal cancer Neg Hx    No family history of bleeding disorders or difficulty with anesthesia  Social Connections: Not on file    Tobacco: denies   Current Outpatient Medications:    acetaminophen (TYLENOL) 500 MG tablet, Take 500 mg by mouth every 6 (six) hours as needed for mild pain., Disp: , Rfl:    aspirin EC (ASPIRIN LOW DOSE) 81 MG tablet, TAKE 1 TABLET(81 MG) BY MOUTH DAILY. SWALLOW WHOLE, Disp: 90 tablet, Rfl: 3   famotidine (PEPCID) 20 MG tablet, Take 1 tablet (20 mg total) by mouth daily., Disp: 180 tablet, Rfl: 3   fexofenadine (ALLEGRA) 180 MG tablet, Take 180 mg by mouth daily., Disp: , Rfl:    oxybutynin (DITROPAN) 5 MG tablet, TAKE 1 TABLET(5 MG) BY MOUTH TWICE DAILY, Disp: 180 tablet, Rfl: 1   pantoprazole (PROTONIX) 40 MG tablet, Protonix 40 mg twice daily then reduce to 40 mg daily., Disp: 90 tablet, Rfl: 3   rosuvastatin (CRESTOR) 10 MG tablet, TAKE 1 TABLET(10 MG) BY MOUTH DAILY, Disp: 90 tablet, Rfl: 1   amoxicillin-clavulanate (AUGMENTIN) 875-125 MG tablet, Take 1 tablet by mouth 2 (two) times daily. (Patient not taking: Reported on 05/01/2023), Disp: 20 tablet, Rfl: 0   predniSONE (DELTASONE) 20 MG tablet, Take 1 tablet (20 mg total) by mouth daily with breakfast. (Patient not taking: Reported on 05/01/2023), Disp: 3 tablet, Rfl: 0   pseudoephedrine (SUDAFED) 30 MG tablet, 1/2 -1 tablet twice daily for 5 days (Patient not taking: Reported on 05/01/2023), Disp: 10 tablet, Rfl: 0   valsartan-hydrochlorothiazide (DIOVAN HCT) 160-12.5 MG tablet, Take 1 tablet by mouth daily. (Patient not taking: Reported on 05/01/2023), Disp: 90 tablet, Rfl: 3  A 20-point ROS was performed with pertinent positives/negatives  noted in the HPI   Physical Exam:   BP 128/74 (BP Location: Right Arm, Patient Position: Sitting, Cuff Size: Large)   Pulse 86   Ht 5\' 9"  (1.753 m)   Wt 231 lb (104.8 kg)   SpO2 97%   BMI 34.11 kg/m    Salient findings:  CN II-XII intact Left: EAC with no significant cerumen; postauricular scar (appears for mastoidectomy). Otherwise Able to visualize TM - modest retraction, inferior myringosclerosis. Possible mild Type 3 myringoincudostapediopexy? - unlikely. No evidence of obvious cholesteatoma Right: EAC clear with some cerumen inferiorly which was cleaned. There is cerumen deep in EAC especially posteriorly over TM. anteriorly, landmarks are visible with  small air fluid level and myringosclerosis. There is a retraction posteriorly - the posteriormost part is difficult to visualize but there is some air in ME space there. TM intact - no areas with obvious cholesteatoma. Weber 512: midline Rinne 512: AC > BC b/l  Rine 1024: AC > BC b/l  Anterior rhinoscopy: Septum relatively midline; bilateral inferior turbinates without significant hypertrophy No lesions of oral cavity/oropharynx; dentition fair No obviously palpable neck masses/lymphadenopathy/thyromegaly No respiratory distress or stridor  Independent Review of Additional Tests or Records:  PCP notes reviewed  Procedures:  Procedure: Bilateral ear microscopy using microscope (CPT 92504) Pre-procedure diagnosis: bilateral eustachian tube dysfunction; history of ear surgery, TM retraction Post-procedure diagnosis: same Indication: Concern for cholesteatoma given bilateral TM retraction; given patient's otologic complaints and history, for improved and comprehensive examination of external ear and tympanic membrane, bilateral otologic examination using microscope was performed  Procedure: Patient was placed semi-recumbent. Both ear canals were examined using the microscope with findings above. Some cerumen removed on left using  suction and currette. See findings above for detailed examination of TM afterwards.  Patient tolerated the procedure well.   Impression & Plans:  Scott Moss is a 47 y.o. male kindly referred by Dr. Aleen Campi for evaluation of bilateral ear discomfort with symptoms consistent with bilateral eustachian tube dysfunction. He does seem to have frequent exacerbations related to his ears and on the left, clearly has a post-surgical ear with mild retraction. Interestingly, his symptoms are on the right with some cerumen deep right on TM and posterior based retraction. Given his symptoms, he likely has chronic ETD and with his history of ear problems, will pursue medical management and further workup.  Bilateral ETD S/p post-auricular surgery on left (as a child) Bilateral ear fullness Right TM retraction - Flonase 2 puffs BID until next visit - Ofloxacin for cerumen 4 drops BID x10d - audiogram - f/u 6-8 weeks; he's only a month out after treatment for b/l AOM   Thank you for allowing me the opportunity to care for your patient. Please do not hesitate to contact me should you have any other questions.  Sincerely, Jovita Kussmaul, MD Otolarynoglogist (ENT), [facility] Phone: [phone] Fax: [phone]  05/01/2023, 8:25 AM

## 2023-05-08 ENCOUNTER — Other Ambulatory Visit: Payer: Self-pay | Admitting: Medical

## 2023-05-15 ENCOUNTER — Ambulatory Visit: Payer: BC Managed Care – PPO | Admitting: Audiology

## 2023-06-09 ENCOUNTER — Ambulatory Visit: Payer: BC Managed Care – PPO | Attending: Otolaryngology | Admitting: Audiology

## 2023-06-09 DIAGNOSIS — H90A32 Mixed conductive and sensorineural hearing loss, unilateral, left ear with restricted hearing on the contralateral side: Secondary | ICD-10-CM | POA: Diagnosis not present

## 2023-06-09 NOTE — Procedures (Signed)
Outpatient Audiology and [facility] [address] [place], [place]  [address] [phone]  AUDIOLOGICAL  EVALUATION  NAME: Scott Moss     DOB:   1975-12-16      MRN: [id number]                                                                                     DATE: 06/09/2023     REFERENT: Jac Canavan, PA-C STATUS: Outpatient DIAGNOSIS: Mixed hearing loss of the left ear and conductive hearing loss in the right ear  History: Author was seen for an audiological evaluation due to bilateral ear discomfort.  Scott Moss reports he has had bilateral ear discomfort for several months.  Scott Moss reports he sometimes feels like his ears have fluid in them.  He has a history of ear infections as an adult.  Scott Moss reports a history of ear surgery in the left ear as a child.  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  He denies concerns regarding his hearing sensitivity.  Scott Moss denies otalgia today, tinnitus, and dizziness and vertigo. Scott Moss has been seen by Dr. Allena Katz, Otolaryngologist, at [facility].  He was last seen by Dr. Allena Katz on May 01, 2023 at which time Dr. Allena Katz noted mild retraction.  Dr. Allena Katz also diagnosed chronic eustachian tube dysfunction.  Scott Moss has a scheduled follow-up appointment with Dr. Allena Katz.   Evaluation:  Otoscopy showed a clear view of the tympanic membranes, bilaterally Tympanometry results were consistent in the right ear with normal middle ear pressure and a hyper mobile tympanic membrane (Type Ad).  Tympanometry results were consistent in the left ear with normal middle ear pressure and reduced tympanic membrane mobility (Type As).  Audiometric testing was completed using Conventional Audiometry techniques with insert earphones and TDH headphones. Test results are consistent in the right ear with a mild hearing loss with conductive components noted.  Test results are consistent in the left ear with normal  hearing sensitivity sloping to a severe hearing loss with conductive components noted with a mixed component noted at 4000 Hz.  Speech Recognition Thresholds were obtained at 30 dB HL in the right ear and at 25 dB HL in the left ear. Word Recognition Testing was completed at 70 dB HL masked and Scott Moss scored 100% bilaterally.    Results:  The test results were reviewed with Scott Moss.  Today's test results from tympanometry show a hypermobile tympanic membrane in the right ear and reduced tympanic membrane mobility in the left ear.  Audiometric test results are consistent in the right ear with a mild hearing loss with conductive components noted.  Test results are consistent in the left ear with normal hearing sensitivity sloping to a severe hearing loss with conductive components noted with a mixed component noted at 4000 Hz.  There is an asymmetry noted at 4000 Hz to 8000 Hz, worse in the left ear. Scott Moss will have hearing and communication difficulty in many listening environments.  He will benefit from the use of good communication strategies.  Recommendations: 1.   Follow-up with Dr. Allena Katz on June 26, 2023 for further ENT management  30 minutes spent testing and counseling on results.   If you have any questions please feel free to contact me at [phone].  Audiogram can be found under the media tab.  Scott Moss Audiologist, Au.D., CCC-A 06/09/2023  3:12 PM  Cc: Jac Canavan, PA-C

## 2023-06-24 ENCOUNTER — Ambulatory Visit: Payer: BC Managed Care – PPO | Admitting: Medical

## 2023-06-24 ENCOUNTER — Encounter: Payer: Self-pay | Admitting: Medical

## 2023-06-24 VITALS — BP 120/70 | HR 75 | Temp 97.3°F | Wt 229.0 lb

## 2023-06-24 DIAGNOSIS — J029 Acute pharyngitis, unspecified: Secondary | ICD-10-CM

## 2023-06-24 DIAGNOSIS — J988 Other specified respiratory disorders: Secondary | ICD-10-CM

## 2023-06-24 DIAGNOSIS — R051 Acute cough: Secondary | ICD-10-CM

## 2023-06-24 LAB — POCT INFLUENZA A/B
Influenza A, POC: NEGATIVE
Influenza B, POC: NEGATIVE

## 2023-06-24 LAB — POC COVID19 BINAXNOW: SARS Coronavirus 2 Ag: NEGATIVE

## 2023-06-24 MED ORDER — HYDROCODONE BIT-HOMATROP MBR 5-1.5 MG/5ML PO SOLN
5.0000 mL | Freq: Three times a day (TID) | ORAL | 0 refills | Status: AC | PRN
Start: 2023-06-24 — End: 2023-06-29

## 2023-06-24 MED ORDER — CEFUROXIME AXETIL 500 MG PO TABS: 500.0000 mg | ORAL_TABLET | Freq: Two times a day (BID) | ORAL | 0 refills | Status: AC

## 2023-06-24 NOTE — Progress Notes (Signed)
Subjective:  Scott Moss is a 47 y.o. male who presents for Chief Complaint  Patient presents with   URI    Sore throat, cough, ear pain, body aches, chills x last Thursday. Dayquil during the day, nquil at night     Here for illness.  Has bad sore throat, throat on fire, bad head pressure, ears hurt, not feeling well, chills.  Using dayquil and nyquil.   symptoms x 5 days.  Coughing quite a bit, worse at night.  No production with cough or sinuses. No nausea or vomiting, but has had some diarrhea.  No fever.   No sick contacts.    Saw ENT recently for ear issues, decreased hearing, was referred for hearing test.  Left eardrum not moving at all.  Goes back soon to see ENT in follow up.  No other aggravating or relieving factors.    No other c/o.  The following portions of the patient's history were reviewed and updated as appropriate: allergies, current medications, past family history, past medical history, past social history, past surgical history and problem list.  ROS Otherwise as in subjective above  Objective: BP 120/70   Pulse 75   Temp (!) 97.3 F (36.3 C)   Wt 229 lb (103.9 kg)   SpO2 98%   BMI 33.82 kg/m   General appearance: alert, no distress, well developed, well nourished, somewhat ill appearing HEENT: normocephalic, sclerae anicteric, conjunctiva pink and moist, TMs abnormal chronic appearing, retracted but no erythema,  nares patent, no discharge , + erythema, pharynx with erythema, no exudate or asymmetry Oral cavity: MMM, no lesions Neck: supple, shoddy anterior tender nodes, otherwise no thyromegaly, no masses Heart: RRR, normal S1, S2, no murmurs Lungs: CTA bilaterally, no wheezes, rhonchi, or rales    Assessment: Encounter Diagnoses  Name Primary?   Sore throat Yes   Acute cough    Respiratory tract infection      Plan: Advise rest, hydration, salt water gargles, sore throat spray for comfort, can use analgesics over-the-counter for comfort.   Begin Ceftin antibiotic.  Can use cough syrup as below but caution on sedation.  If not much improved over the next 5 to 7 days and let me know.  Follow-up with ENT soon for ear issues, recurrent ear infection, decreased hearing.  [name] "Renae Fickle" was seen today for uri.  Diagnoses and all orders for this visit:  Sore throat -     POC COVID-19 -     Influenza A/B  Acute cough -     POC COVID-19 -     Influenza A/B  Respiratory tract infection -     POC COVID-19 -     Influenza A/B  Other orders -     cefUROXime (CEFTIN) 500 MG tablet; Take 1 tablet (500 mg total) by mouth 2 (two) times daily with a meal for 10 days. -     HYDROcodone bit-homatropine (HYCODAN) 5-1.5 MG/5ML syrup; Take 5 mLs by mouth every 8 (eight) hours as needed for up to 5 days for cough.    Follow up: prn

## 2023-06-26 ENCOUNTER — Encounter (INDEPENDENT_AMBULATORY_CARE_PROVIDER_SITE_OTHER): Payer: Self-pay

## 2023-06-26 ENCOUNTER — Ambulatory Visit (INDEPENDENT_AMBULATORY_CARE_PROVIDER_SITE_OTHER): Payer: BC Managed Care – PPO | Admitting: Otolaryngology

## 2023-06-26 VITALS — Ht 69.0 in | Wt 229.0 lb

## 2023-06-26 DIAGNOSIS — H90A32 Mixed conductive and sensorineural hearing loss, unilateral, left ear with restricted hearing on the contralateral side: Secondary | ICD-10-CM

## 2023-06-26 DIAGNOSIS — H938X3 Other specified disorders of ear, bilateral: Secondary | ICD-10-CM | POA: Diagnosis not present

## 2023-06-26 DIAGNOSIS — H6993 Unspecified Eustachian tube disorder, bilateral: Secondary | ICD-10-CM

## 2023-06-26 DIAGNOSIS — R0981 Nasal congestion: Secondary | ICD-10-CM

## 2023-06-26 DIAGNOSIS — J343 Hypertrophy of nasal turbinates: Secondary | ICD-10-CM

## 2023-06-26 DIAGNOSIS — J328 Other chronic sinusitis: Secondary | ICD-10-CM | POA: Diagnosis not present

## 2023-06-26 DIAGNOSIS — J342 Deviated nasal septum: Secondary | ICD-10-CM

## 2023-06-26 DIAGNOSIS — H90A21 Sensorineural hearing loss, unilateral, right ear, with restricted hearing on the contralateral side: Secondary | ICD-10-CM | POA: Diagnosis not present

## 2023-06-26 DIAGNOSIS — H9193 Unspecified hearing loss, bilateral: Secondary | ICD-10-CM

## 2023-06-26 DIAGNOSIS — H903 Sensorineural hearing loss, bilateral: Secondary | ICD-10-CM

## 2023-06-26 NOTE — Patient Instructions (Addendum)
Use flonase twice per day Zyrtec every day  Lloyd Huger Med Nasal Saline Rinse - use every day ---- stop flushing the nose 3 days before your CT scan of sinuses  I have ordered an imaging study (CT and MRI) for you to complete prior to your next visit. Please call [facility] Scheduling at [phone] to schedule your imaging if you have not received a call within 24 hours. If you are unable to complete your imaging study prior to your next scheduled visit please call our office to let us know.   Get the CT scan and MRI about 4 weeks after you finish the antibiotics and steroids  - start nasal saline rinses with NeilMed Bottle available over the counter    Nasal Saline Irrigation instructions: If you choose to make your own salt water solution, You will need: Salt (kosher, canning, or pickling salt) Baking soda Nasal irrigation bottle (i.e. Lloyd Huger Med Sinus Rinse) Measuring spoon ( teaspoon) Distilled / boiled water   Mix solution Mix 1 teaspoon of salt, 1/2 teaspoon of baking soda and 1 cup of water into irrigation bottle ** May use saline packet instead of homemade recipe for this step if you prefer If medicine was prescribed to be mixed with solution, place this into bottle Examples 2 inches of 2% mupirocin ointment Budesonide solution Position your head: Lean over sink (about 45 degrees) Rotate head (about 45 degrees) so that one nostril is above the other Irrigate Insert tip of irrigation bottle into upper nostril so it forms a comfortable seal Irrigate while breathing through your mouth May remove the straw from the bottle in order to irrigate the entire solution (important if medicine was added) Exhale through nose when finished and blow nose as necessary  Repeat on opposite side with other 1/2 of solution (120 mL) or remake solution if all 240 mL was used on first side Wash irrigation bottle regularly, replace every 3 months

## 2023-06-26 NOTE — Progress Notes (Signed)
Dear Dr. Aleen Campi, Here is my assessment for our mutual patient, Scott Moss. Thank you for allowing me the opportunity to care for your patient. Please do not hesitate to contact me should you have any other questions. Sincerely, Dr. Jovita Kussmaul  Otolaryngology Clinic Note Referring provider: Dr. Aleen Campi HPI:  Scott Moss is a 47 y.o. male kindly referred by Dr. Aleen Campi for evaluation of bilateral ear discomfort.  Initial visit (04/2023): He says he has had some bilateral ear discomfort for several months on and off. But most recently had URI type symptoms about a month ago and then had recurrence of discomfort. He reports that he has been feeling like there some fluid behind his ears, some muffed hearing. Right ear drained about 6 weeks ago (yellow ish stuff), but it stopped. He still has some discomfort, no drainage or vertigo. He reports these happen about 2-3 times per year, was diagnosed with ear infections.   Denies other eustachian tube dysfunction symptoms like symptoms with pressure changes. No popping/crackling. He does have some nasal congestion but no nasal symptoms. No hearing change.  He was placed on augmentin and prednisone by his PCP for these symptoms and he reports he is kind of back to normal now. No barotrauma, vestibular suppressant use.  Follow up (06/26/2023): Reports he had a URI last week - sinus infection, prescribed antibiotics and steroids (ceftin, prednisone). This significantly improved his symptoms. Throat was sore as well. Right ear is ok afterwards, left ear with some persistent discomfort. Normally right ear is problem ear, now it was left ear this time. No drainage or vertigo. No tinnitus. No fullness. Audiogram was done in 06/2023 and reviewed independently  He also reports 4-5 sinus infections this year. When he has an infection, symptoms include --- post nasal drip, ear pressure, no discolored drainage, sense of smell is ok. No recent CT sinus. No allergy  symptoms -- claritin. No prior allergy testing  Drives tractor trailer. PSHx: He does report having ear surgery as a child (has a postauricular scar on left)  Most recent hearing test was years ago.   PMHx: Allergy (takes Zyrtec), HTN denies CAD  AP/AC: ASA 81  PSHx/H&N Sx: Nasal surgery (4-5 years ago he reports but more for his breathing - did not help).   PMH/Meds/All/SocHx/FamHx/ROS:   Past Medical History:  Diagnosis Date   Allergy    Arthritis    Atypical chest pain 07/06/2015   Non-obstructive CAD on [facility] 04/2010.   Chondromalacia of left knee 09/2012   Complication of anesthesia    Family history of cancer    multiple family members, multiple types.   Sees Dr. Myna Hidalgo   Family history of cancer    Family history of premature CAD    GERD (gastroesophageal reflux disease)    H/O cardiovascular stress test 07/14/2015   myocardial perfusion scan, normal, EF 45-54% mildy reduced LV function, Dr. Duke Salvia   H/O echocardiogram 07/18/2015   LV mild hypertrophy, 55-60%EF, Dr. Duke Salvia   High cholesterol    Hypertension    Obesity    Overweight    PONV (postoperative nausea and vomiting)    Testicular cancer (HCC) 02/2005   Dr. Myna Hidalgo     Past Surgical History:  Procedure Laterality Date   ANTERIOR CERVICAL DECOMP/DISCECTOMY FUSION  11/11/2008   C5-6   APPENDECTOMY     CARDIAC CATHETERIZATION  04/13/2010   no disease   CERVICAL SPINE SURGERY  08/05/2013   Dr. Lelon Perla   COLONOSCOPY  prior, no problems per patient, year unknown, possibly in early 30s.   HIP ARTHROPLASTY Left 03/2020   HIP ARTHROPLASTY Right 04/2020   KNEE ARTHROSCOPY Right    KNEE ARTHROSCOPY Left 10/19/2015   Procedure: LEFT KNEE SCOPE CHONDROPLASTY;  Surgeon: Loreta Ave, MD;  Location: [facility];  Service: Orthopedics;  Laterality: Left;   KNEE ARTHROSCOPY WITH LATERAL RELEASE Left 09/18/2012   Procedure: KNEE ARTHROSCOPY WITH LATERAL RELEASE;  Surgeon: Loreta Ave, MD;  Location: [facility];  Service: Orthopedics;  Laterality: Left;  WITH DEBRIDEMENT AND SHAVING(CHONDROPLASTY), Excision Medial Plica   NASAL SINUS SURGERY  08/05/2012   PARTIAL KNEE ARTHROPLASTY Left 08/15/2016   Procedure: PATELLA FEMORAL REPLACEMENT LEFT KNEE;  Surgeon: Loreta Ave, MD;  Location: [facility];  Service: Orthopedics;  Laterality: Left;   RADICAL ORCHIECTOMY  02/07/2005   left; with left testicular implant   Family History  Problem Relation Age of Onset   Ovarian cancer Mother        uterine, mets   Hypertension Mother    Stroke Mother    Migraines Mother    Cancer Mother    Lung cancer Father    Hypertension Father    Heart attack Father 2   Skin cancer Father        ?   Heart disease Father    Cancer Father        lung   Cancer Sister 75       multiple myeloma   Hypertension Sister    Migraines Sister    Diabetes Maternal Uncle    Colon cancer Maternal Uncle    Esophageal cancer Maternal Uncle    Heart attack Maternal Uncle    Heart attack Paternal Uncle    Stomach cancer Neg Hx    Colon polyps Neg Hx    Rectal cancer Neg Hx    No family history of bleeding disorders or difficulty with anesthesia  Social Connections: Not on file    Tobacco: denies   Current Outpatient Medications:    acetaminophen (TYLENOL) 500 MG tablet, Take 500 mg by mouth every 6 (six) hours as needed for mild pain., Disp: , Rfl:    aspirin EC (ASPIRIN LOW DOSE) 81 MG tablet, TAKE 1 TABLET(81 MG) BY MOUTH DAILY. SWALLOW WHOLE, Disp: 90 tablet, Rfl: 3   cefUROXime (CEFTIN) 500 MG tablet, Take 1 tablet (500 mg total) by mouth 2 (two) times daily with a meal for 10 days., Disp: 20 tablet, Rfl: 0   famotidine (PEPCID) 20 MG tablet, Take 1 tablet (20 mg total) by mouth daily., Disp: 180 tablet, Rfl: 3   fexofenadine (ALLEGRA) 180 MG tablet, Take 180 mg by mouth daily., Disp: , Rfl:    fluticasone (FLONASE) 50 MCG/ACT nasal spray, Place 2  sprays into both nostrils in the morning and at bedtime., Disp: 16 g, Rfl: 6   HYDROcodone bit-homatropine (HYCODAN) 5-1.5 MG/5ML syrup, Take 5 mLs by mouth every 8 (eight) hours as needed for up to 5 days for cough., Disp: 75 mL, Rfl: 0   oxybutynin (DITROPAN) 5 MG tablet, TAKE 1 TABLET(5 MG) BY MOUTH TWICE DAILY, Disp: 180 tablet, Rfl: 0   pantoprazole (PROTONIX) 40 MG tablet, Protonix 40 mg twice daily then reduce to 40 mg daily., Disp: 90 tablet, Rfl: 3   rosuvastatin (CRESTOR) 10 MG tablet, TAKE 1 TABLET(10 MG) BY MOUTH DAILY, Disp: 90 tablet, Rfl: 0   valsartan-hydrochlorothiazide (DIOVAN HCT) 160-12.5 MG tablet,  Take 1 tablet by mouth daily., Disp: 90 tablet, Rfl: 3  A 20-point ROS was performed with pertinent positives/negatives noted in the HPI   Physical Exam:   Ht 5\' 9"  (1.753 m)   Wt 229 lb (103.9 kg)   BMI 33.82 kg/m    Salient findings:  CN II-XII intact Left: EAC with no significant cerumen; postauricular scar (appears for mastoidectomy). Otherwise Able to visualize TM - modest retraction, inferior myringosclerosis with Type 3 myringoincudostapediopexy. No evidence of obvious cholesteatoma Right: EAC clear. Anteriorly, landmarks are visible with minimal myringosclerosis. There is a retraction posteriorly. TM intact - no areas with obvious cholesteatoma. Weber 512: midline Rinne 512: AC > BC b/l  Rine 1024: AC > BC b/l  Anterior rhinoscopy: Septum dev left, narrow anteriorly; bilateral inferior turbinate hypertrophy; given nasal complaints, nasal endoscopy was indicated for evaluation and is documented below No lesions of oral cavity/oropharynx; dentition fair No obviously palpable neck masses/lymphadenopathy/thyromegaly No respiratory distress or stridor  Independent Review of Additional Tests or Records:  PCP notes reviewed 06/2023 Audiogram was independently reviewed and interpreted by me and I agree with read; there is asymmetry with worse hearing thresholds on left  starting at 4000 Hz; in addition, some mixed component on right at lower frequencies, and <10dB on left AD tymp: Ad AS tymp: As WRT 100% AU 70 dB  SNHL= Sensorineural hearing loss    CT Neck 2015 independently reviewed: B/l max mucosal thickening, b/l ITH, otherwise paranasal sinuses clear Mastoid air cells clear b/l; left CWU mastoidectomy, ossicles generally appear unremarkable and bony labyrinth as well; cuts are quite thick and suboptimal however; ME aerated  Procedures:  PROCEDURE: Bilateral Diagnostic Rigid Nasal Endoscopy Pre-procedure diagnosis: Concern for chronic sinusitis Post-procedure diagnosis: same Indication: See pre-procedure diagnosis and physical exam above Complications: None apparent EBL: 0 mL Anesthesia: Lidocaine 4% and topical decongestant was topically sprayed in each nasal cavity  Description of Procedure:  Patient was identified. A rigid 30 degree endoscope was utilized to evaluate the sinonasal cavities, mucosa, sinus ostia and turbinates and septum.  Overall, mild mucosal inflammation noted.  Also noted are septal deviation and bilateral inferior turbinate hypertrophy.  No mucopurulence, polyps, or masses noted. Left max accessory ostia Right Middle meatus: clear Right SE Recess: clear Left MM: clear Left SE Recess: clear   Photodocumentation was obtained.  CPT CODE -- 13086 - Mod 25   Impression & Plans:  Scott Moss is a 47 y.o. male kindly referred for:  Bilateral ETD S/p post-auricular surgery on left (as a child) Bilateral ear fullness - right worse than left Bilateral TM retraction Asymmetric SNHL Bilateral hearing loss (left mixed, right SNHL) - Flonase 2 puffs BID continue until next visit - No effusion today but he continues to have ETD symptoms - Given asymmetry, will obtain MRI IAC to r/o retrocochlear lesion - consider tubes at next visit  7. Nasal obstruction 8. Nasal septal deviation 9. Bilateral inferior turbinate  hypertrophy 10. Bilateral chronic other sinusitis - Will have him start rinses with [facility] sinus rinses - Continue Daily Zyrtec - Already getting abx so will do a post-treatment scan given prior CT showing max thickening and frequent exacerbations  Please see below for meds and exact dosages prescribed this encounter: No orders of the defined types were placed in this encounter.  - f/u 8 weeks   Thank you for allowing me the opportunity to care for your patient. Please do not hesitate to contact me should you have any other  questions.  Sincerely, Jovita Kussmaul, MD Otolarynoglogist (ENT), [facility] Phone: [phone] Fax: [phone]  06/26/2023, 8:57 AM   I have personally spent 46 minutes involved in face-to-face and non-face-to-face activities for this patient on the day of the visit.  Professional time spent includes the following activities, in addition to those noted in the documentation: preparing to see the patient (review of outside documentation and results), performing a medically appropriate examination and/or evaluation, counseling and educating the patient/family/caregiver, ordering medications, performing procedures (endoscopy), referring and communicating with other healthcare professionals, documenting clinical information in the electronic or other health record, independently interpreting results and communicating results with the patient.

## 2023-07-11 ENCOUNTER — Ambulatory Visit (HOSPITAL_COMMUNITY)
Admission: RE | Admit: 2023-07-11 | Discharge: 2023-07-11 | Disposition: A | Discharge status: Home / Self Care | Payer: BC Managed Care – PPO | Source: Ambulatory Visit | Attending: Otolaryngology | Admitting: Otolaryngology

## 2023-07-11 ENCOUNTER — Ambulatory Visit (HOSPITAL_COMMUNITY)
Admission: RE | Admit: 2023-07-11 | Discharge: 2023-07-11 | Disposition: A | Discharge status: Home / Self Care | Payer: BC Managed Care – PPO | Source: Ambulatory Visit | Attending: Otolaryngology

## 2023-07-11 DIAGNOSIS — H9193 Unspecified hearing loss, bilateral: Secondary | ICD-10-CM | POA: Insufficient documentation

## 2023-07-11 DIAGNOSIS — H903 Sensorineural hearing loss, bilateral: Secondary | ICD-10-CM | POA: Insufficient documentation

## 2023-07-11 DIAGNOSIS — J341 Cyst and mucocele of nose and nasal sinus: Secondary | ICD-10-CM | POA: Diagnosis not present

## 2023-07-11 DIAGNOSIS — J328 Other chronic sinusitis: Secondary | ICD-10-CM | POA: Insufficient documentation

## 2023-07-11 DIAGNOSIS — J329 Chronic sinusitis, unspecified: Secondary | ICD-10-CM | POA: Diagnosis not present

## 2023-07-11 DIAGNOSIS — H9190 Unspecified hearing loss, unspecified ear: Secondary | ICD-10-CM | POA: Diagnosis not present

## 2023-07-11 DIAGNOSIS — H938X3 Other specified disorders of ear, bilateral: Secondary | ICD-10-CM | POA: Insufficient documentation

## 2023-07-11 MED ORDER — GADOBUTROL 1 MMOL/ML IV SOLN
10.0000 mL | Freq: Once | INTRAVENOUS | Status: AC | PRN
  Administered 2023-07-11: 10 mL via INTRAVENOUS

## 2023-07-29 DIAGNOSIS — R509 Fever, unspecified: Secondary | ICD-10-CM | POA: Diagnosis not present

## 2023-07-29 DIAGNOSIS — J111 Influenza due to unidentified influenza virus with other respiratory manifestations: Secondary | ICD-10-CM | POA: Diagnosis not present

## 2023-08-05 ENCOUNTER — Other Ambulatory Visit: Payer: Self-pay | Admitting: Medical

## 2023-08-08 ENCOUNTER — Other Ambulatory Visit: Payer: Self-pay | Admitting: Medical

## 2023-08-18 ENCOUNTER — Ambulatory Visit: Payer: BC Managed Care – PPO | Admitting: Medical

## 2023-08-18 VITALS — BP 118/62 | HR 112 | Temp 97.8°F | Resp 18 | Wt 228.0 lb

## 2023-08-18 DIAGNOSIS — H579 Unspecified disorder of eye and adnexa: Secondary | ICD-10-CM | POA: Diagnosis not present

## 2023-08-18 DIAGNOSIS — J3489 Other specified disorders of nose and nasal sinuses: Secondary | ICD-10-CM | POA: Diagnosis not present

## 2023-08-18 DIAGNOSIS — H938X9 Other specified disorders of ear, unspecified ear: Secondary | ICD-10-CM

## 2023-08-18 DIAGNOSIS — R509 Fever, unspecified: Secondary | ICD-10-CM

## 2023-08-18 DIAGNOSIS — H0520 Unspecified exophthalmos: Secondary | ICD-10-CM

## 2023-08-18 MED ORDER — CLARITHROMYCIN 500 MG PO TABS: 500.0000 mg | ORAL_TABLET | Freq: Two times a day (BID) | ORAL | 0 refills | Status: DC

## 2023-08-18 MED ORDER — PREDNISONE 20 MG PO TABS: ORAL_TABLET | ORAL | 0 refills | Status: DC

## 2023-08-18 NOTE — Progress Notes (Signed)
 Subjective:  Scott Moss is a 48 y.o. male who presents for Chief Complaint  Patient presents with   Influenza    No better from the flu since Christmas, body aches, chest hurts really bad pain level 6/10. SOB. Low grade fever around 99. Left eye is budging out more than right. Feels a lot of pressure behind the left eye. Taken tamiflu  and cough medication and no relief     Here for ongoing cough and congestion.  He was diagnosed with influenza around Christmas.  He went to urgent care Christmas Eve, diagnosed with flu A.  He initially was on cough medicine Tamiflu  but the cough medicine made him inflamed and red in the face so he stopped taking that.  He never really got better though.  He still has cough, chest congestion, discomfort across his chest, body aches, sore throat, ear pressure, still having some low-grade fever and some chills.  He gets some nausea from the cough.  Using DayQuil and NyQuil.  In addition to the symptoms he is left eye that seems to be bulging out more than the right.  Sometimes it will come back down to this has been going on for the last couple weeks and he feels pressure behind the left eye.  No vision change though, no blurry vision, no cloudy vision, no particular eye pain just pressure.   Saw ENT recently for ear issues, decreased hearing, was referred for hearing test.  Left eardrum not moving at all.  Goes back soon to see ENT in follow up.  No other aggravating or relieving factors.    No other c/o.  Past Medical History:  Diagnosis Date   Allergy    Arthritis    Atypical chest pain 07/06/2015   Non-obstructive CAD on [facility] 04/2010.   Chondromalacia of left knee 09/2012   Complication of anesthesia    Family history of cancer    multiple family members, multiple types.   Sees Dr. Timmy   Family history of cancer    Family history of premature CAD    GERD (gastroesophageal reflux disease)    H/O cardiovascular stress test 07/14/2015   myocardial  perfusion scan, normal, EF 45-54% mildy reduced LV function, Dr. Raford   H/O echocardiogram 07/18/2015   LV mild hypertrophy, 55-60%EF, Dr. Raford   High cholesterol    Hypertension    Obesity    Overweight    PONV (postoperative nausea and vomiting)    Testicular cancer (HCC) 02/2005   Dr. Timmy   Current Outpatient Medications on File Prior to Visit  Medication Sig Dispense Refill   acetaminophen  (TYLENOL ) 500 MG tablet Take 500 mg by mouth every 6 (six) hours as needed for mild pain.     aspirin  EC (ASPIRIN  LOW DOSE) 81 MG tablet TAKE 1 TABLET(81 MG) BY MOUTH DAILY. SWALLOW WHOLE 90 tablet 3   famotidine  (PEPCID ) 20 MG tablet Take 1 tablet (20 mg total) by mouth daily. 180 tablet 3   fexofenadine (ALLEGRA) 180 MG tablet Take 180 mg by mouth daily.     fluticasone  (FLONASE ) 50 MCG/ACT nasal spray Place 2 sprays into both nostrils in the morning and at bedtime. 16 g 6   oxybutynin  (DITROPAN ) 5 MG tablet TAKE 1 TABLET(5 MG) BY MOUTH TWICE DAILY 180 tablet 0   pantoprazole  (PROTONIX ) 40 MG tablet Protonix  40 mg twice daily then reduce to 40 mg daily. 90 tablet 3   rosuvastatin  (CRESTOR ) 10 MG tablet TAKE 1 TABLET(10 MG) BY  MOUTH DAILY 90 tablet 0   valsartan -hydrochlorothiazide  (DIOVAN -HCT) 160-12.5 MG tablet TAKE 1 TABLET BY MOUTH DAILY 90 tablet 0   No current facility-administered medications on file prior to visit.     The following portions of the patient's history were reviewed and updated as appropriate: allergies, current medications, past family history, past medical history, past social history, past surgical history and problem list.  ROS Otherwise as in subjective above  Objective: BP 118/62   Pulse (!) 112   Temp 97.8 F (36.6 C)   Resp 18   Wt 228 lb (103.4 kg)   SpO2 98%   BMI 33.67 kg/m   General appearance: alert, no distress, well developed, well nourished, somewhat ill appearing, congested sounding HEENT: normocephalic, sclerae anicteric,  conjunctiva pink and moist, TMs abnormal chronic appearing, retracted but no erythema,  nares patent, positive mucoid discharge , + erythema, pharynx with erythema, no exudate or asymmetry Oral cavity: MMM, no lesions Neck: supple, shoddy anterior tender nodes, otherwise no thyromegaly, no masses Heart: RRR, normal S1, S2, no murmurs Lungs: CTA bilaterally, no wheezes, rhonchi, or rales Left eye asymmetrically seems to be protruding a little bit suggesting exophthalmos compared to the right eye, but PERRLA, EOMI DTRs normal, neurological exam otherwise normal   MRI brain from 07/11/23 IMPRESSION: 1. Unremarkable MRI appearance of the brain for age. 2. No cerebellopontine angle or internal auditory canal mass. 3. 3.6 cm left maxillary sinus mucous retention cyst. 4. Small-volume fluid within the right mastoid air cells.    Assessment: Encounter Diagnoses  Name Primary?   Sinus pressure Yes   Fever, unspecified fever cause    Pressure sensation in ear, unspecified laterality    Purulent nasal discharge    Eye pressure    Exophthalmos       Plan: Sinus pressure, ear pressure, fever, purulent discharge-begin antibiotic Biaxin  below.  Hold off on your cholesterol medicine for the next week while on the antibiotic.  Begin round of prednisone .  Be good about water intake, rest.  If not much improved in 5 days by the end of this week then call back  Exophthalmos, eye pressure-unclear etiology.  I reviewed recent MRI brain he just had in December 2024.  Pending labs we will likely call radiology to go back over the head scan he just had recently and discussed next steps  [name] was seen today for influenza.  Diagnoses and all orders for this visit:  Sinus pressure -     CBC with Differential/Platelet -     Sedimentation rate  Fever, unspecified fever cause -     CBC with Differential/Platelet -     Sedimentation rate  Pressure sensation in ear, unspecified laterality -      Sedimentation rate  Purulent nasal discharge -     Sedimentation rate  Eye pressure -     Sedimentation rate  Exophthalmos -     TSH + free T4 -     Sedimentation rate  Other orders -     predniSONE  (DELTASONE ) 20 MG tablet; 3 tablets daily for 3 days, then 2 tablets daily for 3 days, then 1 tablet daily for 3 days, then 1/2 tablet daily for 3 days. -     clarithromycin  (BIAXIN ) 500 MG tablet; Take 1 tablet (500 mg total) by mouth 2 (two) times daily.     Follow up: Pending labs and call back

## 2023-08-19 ENCOUNTER — Other Ambulatory Visit: Payer: Self-pay | Admitting: Medical

## 2023-08-19 ENCOUNTER — Telehealth: Payer: Self-pay | Admitting: Medical

## 2023-08-19 DIAGNOSIS — R7989 Other specified abnormal findings of blood chemistry: Secondary | ICD-10-CM

## 2023-08-19 DIAGNOSIS — H0520 Unspecified exophthalmos: Secondary | ICD-10-CM

## 2023-08-19 LAB — TSH+FREE T4: Free T4: 3.14 ng/dL — ABNORMAL HIGH (ref 0.82–1.77)

## 2023-08-19 LAB — CBC WITH DIFFERENTIAL/PLATELET
Basophils Absolute: 0 10*3/uL (ref 0.0–0.2)
Basos: 1 %
EOS (ABSOLUTE): 0.1 10*3/uL (ref 0.0–0.4)
Eos: 2 %
Hematocrit: 44.9 % (ref 37.5–51.0)
Hemoglobin: 15 g/dL (ref 13.0–17.7)
Immature Grans (Abs): 0 10*3/uL (ref 0.0–0.1)
Immature Granulocytes: 0 %
Lymphocytes Absolute: 1.7 10*3/uL (ref 0.7–3.1)
Lymphs: 29 %
MCH: 28.7 pg (ref 26.6–33.0)
MCHC: 33.4 g/dL (ref 31.5–35.7)
MCV: 86 fL (ref 79–97)
Monocytes Absolute: 0.6 10*3/uL (ref 0.1–0.9)
Monocytes: 11 %
Neutrophils Absolute: 3.4 10*3/uL (ref 1.4–7.0)
Neutrophils: 57 %
Platelets: 331 10*3/uL (ref 150–450)
RBC: 5.22 x10E6/uL (ref 4.14–5.80)
RDW: 12.2 % (ref 11.6–15.4)
WBC: 5.9 10*3/uL (ref 3.4–10.8)

## 2023-08-19 LAB — SEDIMENTATION RATE: Sed Rate: 11 mm/h (ref 0–15)

## 2023-08-19 MED ORDER — METHIMAZOLE 5 MG PO TABS: 5.0000 mg | ORAL_TABLET | Freq: Two times a day (BID) | ORAL | 1 refills | Status: DC

## 2023-08-19 NOTE — Progress Notes (Signed)
 Labs show normal blood count and marker of inflammation but thyroid  is definitely abnormal suggesting overactive thyroid  which could be related to autoimmune disease or even triggered by acute illness  See if he can check his pulse today and find out what his pulse is today.  It was elevated yesterday  I called the radiologist and this cyst should not be an issue causing the bulging of the.  The bulging is most likely exophthalmos which is related to thyroid  condition  I recommend referral to endocrinology for consult on this issue  I also recommend initiating a medication called methimazole  which is used to help with hyperactivity of the thyroid   If his pulse rate is still elevated we might even need to add a medicine such as metoprolol which is a beta-blocker to slow down the heart rate if he is feeling anxious or palpitations  Let me know his response to these recommendations.  Plan follow-up with me within 2 weeks unless we are able to get him in with endocrinology within that timeframe

## 2023-08-19 NOTE — Telephone Encounter (Signed)
 Pt called about the new medication , issues at the pharmacy.  Called [facility] s/w [place], Tapazole went thru ins with no issues, they are filling now,  called pt & informed.

## 2023-08-20 ENCOUNTER — Telehealth (INDEPENDENT_AMBULATORY_CARE_PROVIDER_SITE_OTHER): Payer: Self-pay | Admitting: Otolaryngology

## 2023-08-20 ENCOUNTER — Telehealth: Payer: Self-pay | Admitting: Medical

## 2023-08-20 ENCOUNTER — Ambulatory Visit (INDEPENDENT_AMBULATORY_CARE_PROVIDER_SITE_OTHER): Payer: BC Managed Care – PPO | Admitting: Medical

## 2023-08-20 VITALS — BP 122/80 | HR 120

## 2023-08-20 DIAGNOSIS — Z8547 Personal history of malignant neoplasm of testis: Secondary | ICD-10-CM

## 2023-08-20 DIAGNOSIS — Z125 Encounter for screening for malignant neoplasm of prostate: Secondary | ICD-10-CM

## 2023-08-20 DIAGNOSIS — R002 Palpitations: Secondary | ICD-10-CM | POA: Diagnosis not present

## 2023-08-20 DIAGNOSIS — N401 Enlarged prostate with lower urinary tract symptoms: Secondary | ICD-10-CM | POA: Diagnosis not present

## 2023-08-20 DIAGNOSIS — R7301 Impaired fasting glucose: Secondary | ICD-10-CM

## 2023-08-20 DIAGNOSIS — I10 Essential (primary) hypertension: Secondary | ICD-10-CM | POA: Diagnosis not present

## 2023-08-20 DIAGNOSIS — K760 Fatty (change of) liver, not elsewhere classified: Secondary | ICD-10-CM

## 2023-08-20 DIAGNOSIS — E785 Hyperlipidemia, unspecified: Secondary | ICD-10-CM

## 2023-08-20 DIAGNOSIS — R350 Frequency of micturition: Secondary | ICD-10-CM | POA: Diagnosis not present

## 2023-08-20 DIAGNOSIS — R7989 Other specified abnormal findings of blood chemistry: Secondary | ICD-10-CM

## 2023-08-20 DIAGNOSIS — R61 Generalized hyperhidrosis: Secondary | ICD-10-CM

## 2023-08-20 DIAGNOSIS — R3911 Hesitancy of micturition: Secondary | ICD-10-CM | POA: Insufficient documentation

## 2023-08-20 DIAGNOSIS — Z1211 Encounter for screening for malignant neoplasm of colon: Secondary | ICD-10-CM | POA: Insufficient documentation

## 2023-08-20 DIAGNOSIS — Z0000 Encounter for general adult medical examination without abnormal findings: Secondary | ICD-10-CM | POA: Diagnosis not present

## 2023-08-20 DIAGNOSIS — Z131 Encounter for screening for diabetes mellitus: Secondary | ICD-10-CM | POA: Diagnosis not present

## 2023-08-20 DIAGNOSIS — R9431 Abnormal electrocardiogram [ECG] [EKG]: Secondary | ICD-10-CM | POA: Insufficient documentation

## 2023-08-20 LAB — POCT URINALYSIS DIP (PROADVANTAGE DEVICE)
Bilirubin, UA: NEGATIVE
Blood, UA: NEGATIVE
Glucose, UA: NEGATIVE mg/dL
Ketones, POC UA: NEGATIVE mg/dL
Leukocytes, UA: NEGATIVE
Nitrite, UA: NEGATIVE
Protein Ur, POC: NEGATIVE mg/dL
Specific Gravity, Urine: 1.01
Urobilinogen, Ur: NEGATIVE
pH, UA: 6.5 (ref 5.0–8.0)

## 2023-08-20 MED ORDER — ATENOLOL 25 MG PO TABS: 25.0000 mg | ORAL_TABLET | Freq: Every day | ORAL | 2 refills | Status: DC

## 2023-08-20 MED ORDER — DILTIAZEM HCL ER COATED BEADS 120 MG PO CP24: 120.0000 mg | ORAL_CAPSULE | Freq: Every day | ORAL | 1 refills | Status: DC

## 2023-08-20 NOTE — Telephone Encounter (Signed)
 Reminder Call:  Date: 08/21/2023 Status: Sch  Time: 8:00 AM [address] [place], [place] [address] Confirmed

## 2023-08-20 NOTE — Telephone Encounter (Signed)
 Please call pt, he said thyroid  specialist can not see him until 2/11

## 2023-08-20 NOTE — Progress Notes (Signed)
 Subjective: Chief Complaint  Patient presents with   Annual Exam    Fasting cpe. Pulse was 111-150.     Patient Care Team: Avary Pitsenbarger, Christiane Cowing, PA-C as PCP - General (Family Medicine) Annis Kinder, DO as Consulting Physician (Gastroenterology) Evelina Hippo, MD as Consulting Physician (Otolaryngology) Allan Arab, AUD (Audiology) Kathlene Paradise as Physician Assistant (Orthopedic Surgery) Maudine Sos, MD as Attending Physician (Cardiology) Osa Blase, MD as Consulting Physician (Orthopedic Surgery) Annis Kinder, DO as Consulting Physician (Gastroenterology)    Concerns: I saw him earlier this week for respiratory tract infection and exophthalmos.  Unfortunately his labs came back showing hyperactive thyroid .  h when e did start methimazole  yesterday.  He feels anxious and having headaches.  His pulse rate has been running between 112 and 150s at rest  Family history includes sister with goiter but no history of thyroid  cancer in the family or hyperactive thyroid   Hyperlipidemia-compliant with medication without complaint, Crestor  10 mg and aspirin  81 mg daily  Hypertension-compliant with medication without complaint, valsartan  HCT 160/12.5 mg daily  Continues on oxybutynin  for sweating, hyperhidrosis  Reviewed their medical, surgical, family, social, medication, and allergy history and updated chart as appropriate.  Past Medical History:  Diagnosis Date   Allergy    Arthritis    Atypical chest pain 07/06/2015   Non-obstructive CAD on [facility] 04/2010.   Chondromalacia of left knee 09/2012   Complication of anesthesia    Family history of cancer    multiple family members, multiple types.   Sees Dr. Maria Shiner   Family history of cancer    Family history of premature CAD    GERD (gastroesophageal reflux disease)    H/O cardiovascular stress test 07/14/2015   myocardial perfusion scan, normal, EF 45-54% mildy reduced LV function, Dr. Theodis Fiscal    H/O echocardiogram 07/18/2015   LV mild hypertrophy, 55-60%EF, Dr. Theodis Fiscal   High cholesterol    Hypertension    Obesity    Overweight    PONV (postoperative nausea and vomiting)    Testicular cancer (HCC) 02/2005   Dr. Maria Shiner    Past Surgical History:  Procedure Laterality Date   ANTERIOR CERVICAL DECOMP/DISCECTOMY FUSION  11/11/2008   C5-6   APPENDECTOMY     CARDIAC CATHETERIZATION  04/13/2010   no disease   CERVICAL SPINE SURGERY  08/05/2013   Dr. Jaycee Metro   COLONOSCOPY     prior, no problems per patient, year unknown, possibly in early 30s.   HIP ARTHROPLASTY Left 03/2020   HIP ARTHROPLASTY Right 04/2020   KNEE ARTHROSCOPY Right    KNEE ARTHROSCOPY Left 10/19/2015   Procedure: LEFT KNEE SCOPE CHONDROPLASTY;  Surgeon: Ferd Householder, MD;  Location: [facility];  Service: Orthopedics;  Laterality: Left;   KNEE ARTHROSCOPY WITH LATERAL RELEASE Left 09/18/2012   Procedure: KNEE ARTHROSCOPY WITH LATERAL RELEASE;  Surgeon: Ferd Householder, MD;  Location: [facility];  Service: Orthopedics;  Laterality: Left;  WITH DEBRIDEMENT AND SHAVING(CHONDROPLASTY), Excision Medial [place]lica   NASAL SINUS SURGERY  08/05/2012   PARTIAL KNEE ARTHROPLASTY Left 08/15/2016   Procedure: PATELLA FEMORAL REPLACEMENT LEFT KNEE;  Surgeon: Ferd Householder, MD;  Location: [facility];  Service: Orthopedics;  Laterality: Left;   RADICAL ORCHIECTOMY  02/07/2005   left; with left testicular implant    Social History   Socioeconomic History   Marital status: Married    Spouse name: Not on file   Number of children: 2  Years of education: Not on file   Highest education level: Not on file  Occupational History   Occupation: truck driver  Tobacco Use   Smoking status: Never    Passive exposure: Never   Smokeless tobacco: Never   Tobacco comments:    never used tobacco  Vaping Use   Vaping status: Never Used  Substance and Sexual Activity   Alcohol  use: Yes    Alcohol/week: 3.0 standard drinks of alcohol    Types: 3 Cans of beer per week    Comment: social   Drug use: No   Sexual activity: Yes  Other Topics Concern   Not on file  Social History Narrative   Married, 2 children, is a Naval architect for [employer].   08/2022      Social Drivers of Corporate investment banker Strain: Not on file  Food Insecurity: Not on file  Transportation Needs: Not on file  Physical Activity: Not on file  Stress: Not on file  Social Connections: Not on file  Intimate Partner Violence: Not on file    Family History  Problem Relation Age of Onset   Ovarian cancer Mother        uterine, mets   Hypertension Mother    Stroke Mother    Migraines Mother    Cancer Mother    Lung cancer Father    Hypertension Father    Heart attack Father 54   Skin cancer Father        ?   Heart disease Father    Cancer Father        lung   Cancer Sister 59       multiple myeloma   Hypertension Sister    Migraines Sister    Diabetes Maternal Uncle    Colon cancer Maternal Uncle    Esophageal cancer Maternal Uncle    Heart attack Maternal Uncle    Heart attack Paternal Uncle    Stomach cancer Neg Hx    Colon polyps Neg Hx    Rectal cancer Neg Hx      Current Outpatient Medications:    acetaminophen  (TYLENOL ) 500 MG tablet, Take 500 mg by mouth every 6 (six) hours as needed for mild pain., Disp: , Rfl:    aspirin  EC (ASPIRIN  LOW DOSE) 81 MG tablet, TAKE 1 TABLET(81 MG) BY MOUTH DAILY. SWALLOW WHOLE, Disp: 90 tablet, Rfl: 3   clarithromycin  (BIAXIN ) 500 MG tablet, Take 1 tablet (500 mg total) by mouth 2 (two) times daily., Disp: 20 tablet, Rfl: 0   diltiazem  (CARDIZEM  CD) 120 MG 24 hr capsule, Take 1 capsule (120 mg total) by mouth daily., Disp: 30 capsule, Rfl: 1   famotidine  (PEPCID ) 20 MG tablet, Take 1 tablet (20 mg total) by mouth daily., Disp: 180 tablet, Rfl: 3   fexofenadine (ALLEGRA) 180 MG tablet, Take 180 mg by mouth daily., Disp: , Rfl:     fluticasone  (FLONASE ) 50 MCG/ACT nasal spray, Place 2 sprays into both nostrils in the morning and at bedtime., Disp: 16 g, Rfl: 6   methimazole  (TAPAZOLE ) 5 MG tablet, Take 1 tablet (5 mg total) by mouth 2 (two) times daily., Disp: 60 tablet, Rfl: 1   oxybutynin  (DITROPAN ) 5 MG tablet, TAKE 1 TABLET(5 MG) BY MOUTH TWICE DAILY, Disp: 180 tablet, Rfl: 0   pantoprazole  (PROTONIX ) 40 MG tablet, Protonix  40 mg twice daily then reduce to 40 mg daily., Disp: 90 tablet, Rfl: 3   predniSONE  (DELTASONE ) 20 MG tablet,  3 tablets daily for 3 days, then 2 tablets daily for 3 days, then 1 tablet daily for 3 days, then 1/2 tablet daily for 3 days., Disp: 21 tablet, Rfl: 0   rosuvastatin  (CRESTOR ) 10 MG tablet, TAKE 1 TABLET(10 MG) BY MOUTH DAILY, Disp: 90 tablet, Rfl: 0   valsartan -hydrochlorothiazide  (DIOVAN -HCT) 160-12.5 MG tablet, TAKE 1 TABLET BY MOUTH DAILY, Disp: 90 tablet, Rfl: 0  Allergies  Allergen Reactions   Benicar  [Olmesartan ]     Rash, allergic reaction    Review of Systems  Constitutional:  Negative for chills, fever, malaise/fatigue and weight loss.  HENT:  Positive for sinus pain. Negative for congestion, ear pain, hearing loss, sore throat and tinnitus.   Eyes:  Negative for blurred vision, pain and redness.  Respiratory:  Positive for cough. Negative for hemoptysis and shortness of breath.   Cardiovascular:  Positive for palpitations. Negative for chest pain, orthopnea, claudication and leg swelling.  Gastrointestinal:  Negative for abdominal pain, blood in stool, constipation, diarrhea, nausea and vomiting.  Genitourinary:  Negative for dysuria, flank pain, frequency, hematuria and urgency.  Musculoskeletal:  Negative for falls, joint pain and myalgias.  Skin:  Negative for itching and rash.  Neurological:  Negative for dizziness, tingling, tremors, speech change, weakness and headaches.  Endo/Heme/Allergies:  Negative for polydipsia. Does not bruise/bleed easily.   Psychiatric/Behavioral:  Negative for depression and memory loss. The patient is nervous/anxious. The patient does not have insomnia.          Objective:  BP 122/80   Pulse (!) 120   SpO2 98%   BP Readings from Last 3 Encounters:  08/20/23 122/80  08/18/23 118/62  06/24/23 120/70   Wt Readings from Last 3 Encounters:  08/18/23 228 lb (103.4 kg)  06/26/23 229 lb (103.9 kg)  06/24/23 229 lb (103.9 kg)    General appearance: alert, no distress, WD/WN, Caucasian male Skin: Tattoos bilateral arms, sleeve type tattoo, tattoo across upper abdomen, no worrisome lesions otherwise HEENT: Normocephalic, PERRLA, EOMI, conjunctive are normal, abnormal TMs, normal pharynx Oral: MMM, no lesions, no erythema or tonsillar swelling Neck: supple, no lymphadenopathy, no thyromegaly, no masses, normal ROM, no bruits Chest: non tender, normal shape and expansion Heart: RRR, normal S1, S2, no murmurs Lungs: clear, no wheezes, no rales Abdomen: +bs, soft, non tender, non distended, no masses, no hepatomegaly, no splenomegaly, no bruits Back: non tender, normal ROM, no scoliosis Musculoskeletal: upper extremities non tender, no obvious deformity, lower extremities non tender, no obvious deformity Extremities: no edema, no cyanosis, no clubbing Pulses: 2+ symmetric, upper and lower extremities, normal cap refill Neurological: alert, oriented x 3, CN2-12 intact, strength normal upper extremities and lower extremities, sensation normal throughout, DTRs 2+ throughout, no cerebellar signs, gait normal Psychiatric: normal affect, behavior normal, pleasant  GU: deferred Rectal: deferred  EKG reviewed   Assessment and Plan :   Encounter Diagnoses  Name Primary?   Encounter for health maintenance examination in adult Yes   Essential hypertension    Screening for prostate cancer    Screening for diabetes mellitus    Impaired fasting blood sugar    Hyperlipidemia, unspecified hyperlipidemia type     Hyperhidrosis    H/O testicular cancer    Fatty liver    Urinary hesitancy    Benign prostatic hyperplasia with urinary frequency    Palpitation    Abnormal thyroid  blood test    Screening for colon cancer    Abnormal EKG     Health screening: See your  eye doctor yearly for routine vision care. See your dentist yearly for routine dental care including hygiene visits twice yearly.   Vaccines: Immunization History  Administered Date(s) Administered   Janssen (J&J) SARS-COV-2 Vaccination 01/19/2020   PFIZER(Purple Top)SARS-COV-2 Vaccination 02/03/2021   Pneumococcal Polysaccharide-23 08/16/2022   Tdap 04/03/2016   Declines both flu and covid vaccines   Cancer screens: I reviewed his August 2022 colonoscopy report.  Will do FIT testing.  Sent home with test.  Prostate screening today given history of other cancer and given age  We discussed skin surveillance.    Separate significant issues discussed: Abnormal thyroid  labs, hyperactive thyroid -just started methimazole  yesterday.  Add Diltiazem  medication daily today with palpitations and tachycardia. Begin 325mg  Aspirin  as of today.  Chad2DS2 score of 1. Discussed risks/benefits of aspirin  vs xarelto or other.   He wants to stick with aspirin  for now.   Referral has already been placed to endocrinology, urgent request.  Lets see you back next week regarding the heart rate and arhythmia and new medicaiton  Sinus issues - follow up with ENT, stop your Biaxin  antibiotic Sunday if improved.  Stop the prednisone  for now.  Excess sweating/Hyperhidrosis -continue your Oxybutynin , does ok on this   Hyperlipidemia-continue Crestor  10 mg daily after you finish the biaxin  antibiotic.  Labs today  Hypertension- Valsartan  HCT 160/12.5 mg in daily   History of fatty liver disease Eat a low fat diet Exercise regularly limit alcohol Work on losing weight  Impaired fasting glucose, screen for diabetes-labs today  BPH, urinary  symptoms-labs and urinalysis today  Abnormal EKG - afib findings related to new hyperthyroid issue.  Discussed possible complications.   Begin diltiazem .  f/u next week.    Discussed case, recommendations with PCP Dr. Flint Hummer "Scott Moss" was seen today for annual exam.  Diagnoses and all orders for this visit:  Encounter for health maintenance examination in adult -     Hepatitis C antibody -     Comprehensive metabolic panel -     Lipid panel -     Hemoglobin A1c -     PSA -     EKG 12-Lead -     POCT Urinalysis DIP (Proadvantage Device)  Essential hypertension  Screening for prostate cancer -     PSA  Screening for diabetes mellitus -     Hemoglobin A1c  Impaired fasting blood sugar -     Hemoglobin A1c  Hyperlipidemia, unspecified hyperlipidemia type -     Lipid panel  Hyperhidrosis  H/O testicular cancer  Fatty liver  Urinary hesitancy -     PSA -     POCT Urinalysis DIP (Proadvantage Device)  Benign prostatic hyperplasia with urinary frequency -     PSA -     POCT Urinalysis DIP (Proadvantage Device)  Palpitation -     EKG 12-Lead  Abnormal thyroid  blood test -     EKG 12-Lead  Screening for colon cancer -     Fecal occult blood, imunochemical  Abnormal EKG  Other orders -     Discontinue: atenolol  (TENORMIN ) 25 MG tablet; Take 1 tablet (25 mg total) by mouth daily. -     diltiazem  (CARDIZEM  CD) 120 MG 24 hr capsule; Take 1 capsule (120 mg total) by mouth daily.   Follow-up pending labs, yearly for physical

## 2023-08-20 NOTE — Patient Instructions (Signed)
 Health screening: See your eye doctor yearly for routine vision care. See your dentist yearly for routine dental care including hygiene visits twice yearly.   Vaccines: Immunization History  Administered Date(s) Administered   Janssen (J&J) SARS-COV-2 Vaccination 01/19/2020   PFIZER(Purple Top)SARS-COV-2 Vaccination 02/03/2021   Pneumococcal Polysaccharide-23 08/16/2022   Tdap 04/03/2016   Declines both flu and covid vaccines   Cancer screens: I reviewed his August 2022 colonoscopy report.  Will do FIT testing.  Sent home with test.  Prostate screening today given history of other cancer and given age  We discussed skin surveillance.    Separate significant issues discussed: Abnormal thyroid  labs, hyperactive thyroid -just started methimazole  yesterday.  Add Diltiazem  medication daily today with palpitations and tachycardia. Begin 325mg  Aspirin  as of today.  Referral has already been placed to endocrinology.  Lets see you back next week regarding the heart rate and arhythmia and new medicaiton  Sinus issues - follow up with ENT, stop your Biaxin  antibiotic Sunday if improved.  Stop the prednisone  for now.  Excess sweating/Hyperhidrosis -continue your Oxybutynin , does ok on this   Hyperlipidemia-continue Crestor  10 mg daily after you finish the biaxin  antibiotic.  Labs today  Hypertension- Valsartan  HCT 160/12.5 mg in daily   History of fatty liver disease Eat a low fat diet Exercise regularly limit alcohol Work on losing weight  Impaired fasting glucose, screen for diabetes-labs today  BPH, urinary symptoms-labs and urinalysis today

## 2023-08-21 ENCOUNTER — Encounter: Payer: Self-pay | Admitting: Medical

## 2023-08-21 ENCOUNTER — Ambulatory Visit (INDEPENDENT_AMBULATORY_CARE_PROVIDER_SITE_OTHER): Payer: BC Managed Care – PPO | Admitting: Otolaryngology

## 2023-08-21 ENCOUNTER — Telehealth: Payer: Self-pay | Admitting: Medical

## 2023-08-21 ENCOUNTER — Encounter (INDEPENDENT_AMBULATORY_CARE_PROVIDER_SITE_OTHER): Payer: Self-pay

## 2023-08-21 VITALS — BP 116/77 | HR 85

## 2023-08-21 DIAGNOSIS — H938X3 Other specified disorders of ear, bilateral: Secondary | ICD-10-CM

## 2023-08-21 DIAGNOSIS — J342 Deviated nasal septum: Secondary | ICD-10-CM

## 2023-08-21 DIAGNOSIS — H903 Sensorineural hearing loss, bilateral: Secondary | ICD-10-CM | POA: Diagnosis not present

## 2023-08-21 DIAGNOSIS — H9193 Unspecified hearing loss, bilateral: Secondary | ICD-10-CM

## 2023-08-21 DIAGNOSIS — R0981 Nasal congestion: Secondary | ICD-10-CM | POA: Diagnosis not present

## 2023-08-21 DIAGNOSIS — J343 Hypertrophy of nasal turbinates: Secondary | ICD-10-CM

## 2023-08-21 DIAGNOSIS — J328 Other chronic sinusitis: Secondary | ICD-10-CM

## 2023-08-21 LAB — LIPID PANEL
Chol/HDL Ratio: 2.6 {ratio} (ref 0.0–5.0)
Cholesterol, Total: 115 mg/dL (ref 100–199)
HDL: 45 mg/dL (ref >39)
LDL Chol Calc (NIH): 58 mg/dL (ref 0–99)
Triglycerides: 54 mg/dL (ref 0–149)
VLDL Cholesterol Cal: 12 mg/dL (ref 5–40)

## 2023-08-21 LAB — COMPREHENSIVE METABOLIC PANEL
ALT: 29 [IU]/L (ref 0–44)
AST: 22 [IU]/L (ref 0–40)
Albumin: 4.1 g/dL (ref 4.1–5.1)
Alkaline Phosphatase: 135 [IU]/L — ABNORMAL HIGH (ref 44–121)
BUN/Creatinine Ratio: 19 (ref 9–20)
BUN: 15 mg/dL (ref 6–24)
Bilirubin Total: 0.8 mg/dL (ref 0.0–1.2)
CO2: 19 mmol/L — ABNORMAL LOW (ref 20–29)
Calcium: 9.6 mg/dL (ref 8.7–10.2)
Chloride: 102 mmol/L (ref 96–106)
Creatinine, Ser: 0.78 mg/dL (ref 0.76–1.27)
Globulin, Total: 2.7 g/dL (ref 1.5–4.5)
Glucose: 139 mg/dL — ABNORMAL HIGH (ref 70–99)
Potassium: 4.8 mmol/L (ref 3.5–5.2)
Sodium: 138 mmol/L (ref 134–144)
Total Protein: 6.8 g/dL (ref 6.0–8.5)
eGFR: 109 mL/min/{1.73_m2} (ref >59)

## 2023-08-21 LAB — HEPATITIS C ANTIBODY: Hep C Virus Ab: NONREACTIVE

## 2023-08-21 LAB — HEMOGLOBIN A1C
Est. average glucose Bld gHb Est-mCnc: 131 mg/dL
Hgb A1c MFr Bld: 6.2 % — ABNORMAL HIGH (ref 4.8–5.6)

## 2023-08-21 LAB — PSA: Prostate Specific Ag, Serum: 1.6 ng/mL (ref 0.0–4.0)

## 2023-08-21 NOTE — Telephone Encounter (Signed)
Scott Moss came in today to get his work note for light duty for one week. He didn't want to do two weeks at this time. He says endo can't get him in until 09/16/23 and another place said 09/12/23 but he couldn't remember which location gave the 09/12/23 date. He is wanting to know if someone can get him in sooner. He doesn't want to miss a lot of work.

## 2023-08-21 NOTE — Progress Notes (Signed)
Results sent through MyChart

## 2023-08-21 NOTE — Telephone Encounter (Signed)
Pt was notified. He is aware of his appt on 09/08/23 but trying to get him in sooner

## 2023-08-21 NOTE — Progress Notes (Signed)
Dear Dr. Aleen Campi, Here is my assessment for our mutual patient, Scott Moss. Thank you for allowing me the opportunity to care for your patient. Please do not hesitate to contact me should you have any other questions. Sincerely, Dr. Jovita Kussmaul  Otolaryngology Clinic Note Referring provider: Dr. Aleen Campi HPI:  Scott Moss is a 48 y.o. male kindly referred by Dr. Aleen Campi for evaluation of bilateral ear discomfort.  Initial visit (04/2023): He says he has had some bilateral ear discomfort for several months on and off. But most recently had URI type symptoms about a month ago and then had recurrence of discomfort. He reports that he has been feeling like there some fluid behind his ears, some muffed hearing. Right ear drained about 6 weeks ago (yellow ish stuff), but it stopped. He still has some discomfort, no drainage or vertigo. He reports these happen about 2-3 times per year, was diagnosed with ear infections.   Denies other eustachian tube dysfunction symptoms like symptoms with pressure changes. No popping/crackling. He does have some nasal congestion but no nasal symptoms. No hearing change.  He was placed on augmentin and prednisone by his PCP for these symptoms and he reports he is kind of back to normal now. No barotrauma, vestibular suppressant use.  Follow up (06/26/2023): Reports he had a URI last week - sinus infection, prescribed antibiotics and steroids (ceftin, prednisone). This significantly improved his symptoms. Throat was sore as well. Right ear is ok afterwards, left ear with some persistent discomfort. Normally right ear is problem ear, now it was left ear this time. No drainage or vertigo. No tinnitus. No fullness. Audiogram was done in 06/2023 and reviewed independently  He also reports 4-5 sinus infections this year. When he has an infection, symptoms include --- post nasal drip, ear pressure, no discolored drainage, sense of smell is ok. No recent CT sinus. No allergy  symptoms -- claritin. No prior allergy testing ---------------------------------------------- 08/21/2023: He reports that he got the flu right before christmas and was prescribed tamiflu and an antibiotics. He was also prescribed prednisone and clarithromycin. He stopped taking prednisone due to his thyroid. He currently continues to report frontal sinus pressure and headaches bilaterally. No discolored drainage, PND, sense of smell is ok. Last sinus infection he reports in November.   He is scheduled to see endocrinology in Feb due to hyperthyroidism, and he notes proptosis and palpitations. He is on diltiazem for this.   Ears doing ok, still b/l pressure; no drainage or vertigo. Using flonase once a day, no rinses ------------------------------------------  Drives tractor trailer. PSHx: He does report having ear surgery as a child (has a postauricular scar on left)  Most recent hearing test was years ago.   PMHx: Allergy (takes Zyrtec), HTN, Sweating on Oxybutynin, Hyperthyroidism,  denies CAD  AP/AC: ASA 81  PSHx/H&N Sx: Nasal surgery (4-5 years ago he reports but more for his breathing - did not help).   PMH/Meds/All/SocHx/FamHx/ROS:   Past Medical History:  Diagnosis Date   Allergy    Arthritis    Atypical chest pain 07/06/2015   Non-obstructive CAD on [facility] 04/2010.   Chondromalacia of left knee 09/2012   Complication of anesthesia    Family history of cancer    multiple family members, multiple types.   Sees Dr. Myna Hidalgo   Family history of cancer    Family history of premature CAD    GERD (gastroesophageal reflux disease)    H/O cardiovascular stress test 07/14/2015   myocardial perfusion scan,  normal, EF 45-54% mildy reduced LV function, Dr. Duke Salvia   H/O echocardiogram 07/18/2015   LV mild hypertrophy, 55-60%EF, Dr. Duke Salvia   High cholesterol    Hypertension    Obesity    Overweight    PONV (postoperative nausea and vomiting)    Testicular cancer (HCC) 02/2005    Dr. Myna Hidalgo     Past Surgical History:  Procedure Laterality Date   ANTERIOR CERVICAL DECOMP/DISCECTOMY FUSION  11/11/2008   C5-6   APPENDECTOMY     CARDIAC CATHETERIZATION  04/13/2010   no disease   CERVICAL SPINE SURGERY  08/05/2013   Dr. Lelon Perla   COLONOSCOPY     prior, no problems per patient, year unknown, possibly in early 30s.   HIP ARTHROPLASTY Left 03/2020   HIP ARTHROPLASTY Right 04/2020   KNEE ARTHROSCOPY Right    KNEE ARTHROSCOPY Left 10/19/2015   Procedure: LEFT KNEE SCOPE CHONDROPLASTY;  Surgeon: Loreta Ave, MD;  Location: [facility];  Service: Orthopedics;  Laterality: Left;   KNEE ARTHROSCOPY WITH LATERAL RELEASE Left 09/18/2012   Procedure: KNEE ARTHROSCOPY WITH LATERAL RELEASE;  Surgeon: Loreta Ave, MD;  Location: [facility];  Service: Orthopedics;  Laterality: Left;  WITH DEBRIDEMENT AND SHAVING(CHONDROPLASTY), Excision Medial Plica   NASAL SINUS SURGERY  08/05/2012   PARTIAL KNEE ARTHROPLASTY Left 08/15/2016   Procedure: PATELLA FEMORAL REPLACEMENT LEFT KNEE;  Surgeon: Loreta Ave, MD;  Location: [facility];  Service: Orthopedics;  Laterality: Left;   RADICAL ORCHIECTOMY  02/07/2005   left; with left testicular implant   Family History  Problem Relation Age of Onset   Ovarian cancer Mother        uterine, mets   Hypertension Mother    Stroke Mother    Migraines Mother    Cancer Mother    Lung cancer Father    Hypertension Father    Heart attack Father 5   Skin cancer Father        ?   Heart disease Father    Cancer Father        lung   Cancer Sister 59       multiple myeloma   Hypertension Sister    Migraines Sister    Diabetes Maternal Uncle    Colon cancer Maternal Uncle    Esophageal cancer Maternal Uncle    Heart attack Maternal Uncle    Heart attack Paternal Uncle    Stomach cancer Neg Hx    Colon polyps Neg Hx    Rectal cancer Neg Hx    No family history of bleeding  disorders or difficulty with anesthesia  Social Connections: Not on file    Tobacco: denies   Current Outpatient Medications:    acetaminophen (TYLENOL) 500 MG tablet, Take 500 mg by mouth every 6 (six) hours as needed for mild pain., Disp: , Rfl:    aspirin EC (ASPIRIN LOW DOSE) 81 MG tablet, TAKE 1 TABLET(81 MG) BY MOUTH DAILY. SWALLOW WHOLE, Disp: 90 tablet, Rfl: 3   clarithromycin (BIAXIN) 500 MG tablet, Take 1 tablet (500 mg total) by mouth 2 (two) times daily., Disp: 20 tablet, Rfl: 0   diltiazem (CARDIZEM CD) 120 MG 24 hr capsule, Take 1 capsule (120 mg total) by mouth daily., Disp: 30 capsule, Rfl: 1   famotidine (PEPCID) 20 MG tablet, Take 1 tablet (20 mg total) by mouth daily., Disp: 180 tablet, Rfl: 3   fexofenadine (ALLEGRA) 180 MG tablet, Take 180 mg by mouth  daily., Disp: , Rfl:    fluticasone (FLONASE) 50 MCG/ACT nasal spray, Place 2 sprays into both nostrils in the morning and at bedtime., Disp: 16 g, Rfl: 6   methimazole (TAPAZOLE) 5 MG tablet, Take 1 tablet (5 mg total) by mouth 2 (two) times daily., Disp: 60 tablet, Rfl: 1   oxybutynin (DITROPAN) 5 MG tablet, TAKE 1 TABLET(5 MG) BY MOUTH TWICE DAILY, Disp: 180 tablet, Rfl: 0   pantoprazole (PROTONIX) 40 MG tablet, Protonix 40 mg twice daily then reduce to 40 mg daily., Disp: 90 tablet, Rfl: 3   predniSONE (DELTASONE) 20 MG tablet, 3 tablets daily for 3 days, then 2 tablets daily for 3 days, then 1 tablet daily for 3 days, then 1/2 tablet daily for 3 days., Disp: 21 tablet, Rfl: 0   rosuvastatin (CRESTOR) 10 MG tablet, TAKE 1 TABLET(10 MG) BY MOUTH DAILY, Disp: 90 tablet, Rfl: 0   valsartan-hydrochlorothiazide (DIOVAN-HCT) 160-12.5 MG tablet, TAKE 1 TABLET BY MOUTH DAILY, Disp: 90 tablet, Rfl: 0  A 20-point ROS was performed with pertinent positives/negatives noted in the HPI   Physical Exam:   There were no vitals taken for this visit.   Salient findings:  CN II-XII intact; appears to have left exophthalmos Left: EAC  with no significant cerumen; postauricular scar (appears for mastoidectomy). Otherwise Able to visualize TM - modest retraction, inferior myringosclerosis with Type 3 myringoincudostapediopexy. No evidence of obvious cholesteatoma Right: EAC clear. Anteriorly, landmarks are visible with minimal myringosclerosis. There is a retraction posteriorly. TM intact - no areas with obvious cholesteatoma. Stable ear findings compared to last time Weber 512: midline Rinne 512: AC > BC b/l  Rine 1024: AC > BC b/l  Anterior rhinoscopy: Septum dev left, narrow anteriorly; bilateral inferior turbinate hypertrophy No lesions of oral cavity/oropharynx; dentition fair No obviously palpable neck masses/lymphadenopathy/thyromegaly No respiratory distress or stridor  Independent Review of Additional Tests or Records:  PCP notes reviewed 06/2023 Audiogram was independently reviewed and interpreted by me and I agree with read; there is asymmetry with worse hearing thresholds on left starting at 4000 Hz; in addition, some mixed component on right at lower frequencies, and <10dB on left AD tymp: Ad AS tymp: As WRT 100% AU 70 dB  SNHL= Sensorineural hearing loss    CT Neck 2015 independently reviewed: B/l max mucosal thickening, b/l ITH, otherwise paranasal sinuses clear; Mastoid air cells clear b/l; left CWU mastoidectomy, ossicles generally appear unremarkable and bony labyrinth as well; cuts are quite thick and suboptimal however; ME aerated  CT Sinus w/o contrast (07/11/2023) independently interpreted showing: large left max mucous retention cyst, septum relatively midline, minimal ethmoid MPT b/l; mastoids, ME clear with mild right mastoid fluid; prior left CWU mastoidectomy, ossicles and labyrinth generally unremarkable  MRI Brain IAC 12/62024 also independently reviewed: no retrocochlear lesions noted  Procedures:  PROCEDURE: Bilateral Diagnostic Rigid Nasal Endoscopy Prior, not today  Description of  Procedure:  Patient was identified. A rigid 30 degree endoscope was utilized to evaluate the sinonasal cavities, mucosa, sinus ostia and turbinates and septum.  Overall, mild mucosal inflammation noted.  Also noted are septal deviation and bilateral inferior turbinate hypertrophy.  No mucopurulence, polyps, or masses noted. Left max accessory ostia Right Middle meatus: clear Right SE Recess: clear Left MM: clear Left SE Recess: clear   Photodocumentation was obtained.  CPT CODE -- 09604 - Mod 25   Impression & Plans:  Scott Moss is a 48 y.o. male with Hyperthyroidism kindly referred for:  1. Ear  fullness, bilateral   2. Bilateral hearing loss, unspecified hearing loss type   3. Asymmetric SNHL (sensorineural hearing loss)   4. Other chronic sinusitis   5. Nasal congestion   6. Hypertrophy of both inferior nasal turbinates   7. Nasal septal deviation    Multiple issues including with persistent ear pressure right > left. Likely ETD; s/p prior mastoidectomy on left as a child. TM retracted, stable, no evidence of cholesteatoma - suspect sx likely 2/2 ETD; continue flonase - MRI IAC reassuring for asymmetric HL - consider PE tubes if sx persist  For nasal symptoms, does appear to have some left sided chronic sinus changes; improved on abx and steroids. Not doing rinses currently but on flonase - Continue flonase; will increase  - Increase flonase to twice per day - Continue daily zyrtec - Will have thyroid stabilize and then proceed with tubes and possible b/l FESS (max, and anterior ethmoid likely)  for meds and exact dosages prescribed this encounter: increase flonase to 2 puffs BID  - f/u 8 weeks   Thank you for allowing me the opportunity to care for your patient. Please do not hesitate to contact me should you have any other questions.  Sincerely, Jovita Kussmaul, MD Otolaryngologist (ENT), [facility] Phone: [phone] Fax:  [phone]  08/21/2023, 7:53 AM   MDM:  Level 4: 99214 Complexity/Problems addressed: mod - multiple chronic problems Data complexity: mod - independent review and interpretation of CT imaging and MRI imaging - Morbidity: mod  - Prescription Drug prescribed or managed: yes

## 2023-08-22 ENCOUNTER — Encounter: Payer: Self-pay | Admitting: Medical

## 2023-08-22 NOTE — Telephone Encounter (Signed)
Spoke to Scott Moss he had someone at work check pulse and he said it was about 90

## 2023-08-22 NOTE — Telephone Encounter (Signed)
Call and see if he can check his pulse this morning.   If he doesn't know how, explain to him how to check.   I want to see if less than 90 pulse now, and if any improvement in symptom so far on the diltiazem and methimazole.   I received word back from employer about restrictions in the short term, so trying to decide how to handle his restrictions.

## 2023-08-27 ENCOUNTER — Ambulatory Visit: Payer: BC Managed Care – PPO | Admitting: "Endocrinology

## 2023-08-27 ENCOUNTER — Ambulatory Visit: Payer: BC Managed Care – PPO | Admitting: Medical

## 2023-08-27 ENCOUNTER — Encounter: Payer: Self-pay | Admitting: "Endocrinology

## 2023-08-27 VITALS — BP 120/70 | HR 106 | Ht 69.0 in | Wt 221.6 lb

## 2023-08-27 VITALS — BP 110/70 | HR 105 | Wt 220.4 lb

## 2023-08-27 DIAGNOSIS — E0590 Thyrotoxicosis, unspecified without thyrotoxic crisis or storm: Secondary | ICD-10-CM | POA: Diagnosis not present

## 2023-08-27 DIAGNOSIS — R7989 Other specified abnormal findings of blood chemistry: Secondary | ICD-10-CM | POA: Diagnosis not present

## 2023-08-27 DIAGNOSIS — H0520 Unspecified exophthalmos: Secondary | ICD-10-CM | POA: Diagnosis not present

## 2023-08-27 DIAGNOSIS — R000 Tachycardia, unspecified: Secondary | ICD-10-CM | POA: Diagnosis not present

## 2023-08-27 NOTE — Progress Notes (Signed)
Subjective:  Scott Moss is a 48 y.o. male who presents for Chief Complaint  Patient presents with   Follow-up    1 week follow-up. Doing much better. Seen Endocrinology this morning and had labs done. Will be seeing an eye doctor soon about his eye for possible Graves Disease. Needs paperwork to be filled out to go back to work full duty tomorrow      Here for follow-up from last week.  I saw him on 08/20/2023 for well visit but had seen him also on 08/19/2023 for exophthalmos and abnormal thyroid blood tests.  We initiated methimazole and diltiazem last week for new onset hyperthyroidism.  So far he is doing okay on the medications without any complaint.  He was able to get in with endocrinology this morning.  He did additional blood work this morning.  They made no changes yet with his medications but they talked about possibly increasing his methimazole dose and diltiazem dose.  He feels fine today and his eye seems less bulging  He does need a work release back to full duties unless we object  No other aggravating or relieving factors.    No other c/o.  Past Medical History:  Diagnosis Date   Allergy    Arthritis    Atypical chest pain 07/06/2015   Non-obstructive CAD on [facility] 04/2010.   Chondromalacia of left knee 09/2012   Complication of anesthesia    Family history of cancer    multiple family members, multiple types.   Sees Dr. Myna Hidalgo   Family history of cancer    Family history of premature CAD    GERD (gastroesophageal reflux disease)    H/O cardiovascular stress test 07/14/2015   myocardial perfusion scan, normal, EF 45-54% mildy reduced LV function, Dr. Duke Salvia   H/O echocardiogram 07/18/2015   LV mild hypertrophy, 55-60%EF, Dr. Duke Salvia   High cholesterol    Hypertension    Obesity    Overweight    PONV (postoperative nausea and vomiting)    Testicular cancer (HCC) 02/2005   Dr. Myna Hidalgo   Current Outpatient Medications on File Prior to Visit  Medication  Sig Dispense Refill   acetaminophen (TYLENOL) 500 MG tablet Take 500 mg by mouth every 6 (six) hours as needed for mild pain.     aspirin EC (ASPIRIN LOW DOSE) 81 MG tablet TAKE 1 TABLET(81 MG) BY MOUTH DAILY. SWALLOW WHOLE 90 tablet 3   diltiazem (CARDIZEM CD) 120 MG 24 hr capsule Take 1 capsule (120 mg total) by mouth daily. 30 capsule 1   famotidine (PEPCID) 20 MG tablet Take 1 tablet (20 mg total) by mouth daily. 180 tablet 3   fexofenadine (ALLEGRA) 180 MG tablet Take 180 mg by mouth daily.     methimazole (TAPAZOLE) 5 MG tablet Take 1 tablet (5 mg total) by mouth 2 (two) times daily. 60 tablet 1   oxybutynin (DITROPAN) 5 MG tablet TAKE 1 TABLET(5 MG) BY MOUTH TWICE DAILY 180 tablet 0   rosuvastatin (CRESTOR) 10 MG tablet TAKE 1 TABLET(10 MG) BY MOUTH DAILY 90 tablet 0   valsartan-hydrochlorothiazide (DIOVAN-HCT) 160-12.5 MG tablet TAKE 1 TABLET BY MOUTH DAILY 90 tablet 0   No current facility-administered medications on file prior to visit.     The following portions of the patient's history were reviewed and updated as appropriate: allergies, current medications, past family history, past medical history, past social history, past surgical history and problem list.  ROS Otherwise as in subjective above  Objective:  BP 110/70   Pulse (!) 105   Wt 220 lb 6.4 oz (100 kg)   SpO2 97%   BMI 32.55 kg/m   General appearance: alert, no distress, well developed, well nourished Left eye not quite as bulging as it was last week Neck: supple, no lymphadenopathy, no thyromegaly, no masses Heart: I still hear some ectopic beats occasionally but mostly RRR, normal S1, S2, no murmurs Lungs: CTA bilaterally, no wheezes, rhonchi, or rales Pulses: 2+ radial pulses, 2+ pedal pulses, normal cap refill Ext: no edema   Assessment: Encounter Diagnoses  Name Primary?   Abnormal thyroid blood test Yes   Tachycardia      Plan: Thankfully he was able to get in with endocrinology this morning.   He saw Dr. [place] this morning.  Initial blood work today.  He is doing fine on the current medication it was initiated last week, methimazole and diltiazem.  We will give him a little bit more time on the current medication.  He is supposed hear back from endocrinology within the next few days about lab results and about any medication changes.  We may end up increasing the diltiazem if his heart rate is staying elevated.  He will continue to monitor his heart rate  Continue other medicines as usual  Note given to release back to work for full duties at this point  [employer]" was seen today for follow-up.  Diagnoses and all orders for this visit:  Abnormal thyroid blood test  Tachycardia    Follow up: pending call back

## 2023-08-27 NOTE — Progress Notes (Signed)
Outpatient Endocrinology Note Scott Doddridge, MD  08/27/23   Scott Moss Feb 27, 1976 [id number]  Referring Provider: Jac Canavan, PA-C Primary Care Provider: Jac Canavan, PA-C Subjective  No chief complaint on file.   Assessment & Plan  Diagnoses and all orders for this visit:  Hyperthyroidism -     T4, free -     T3, free -     TSH -     TRAb (TSH Receptor Binding Antibody) -     Thyroid stimulating immunoglobulin -     Ambulatory referral to Ophthalmology  Proptosis   Scott Moss is currently taking methimazole 5 mg bid and cardizem 120 mg qd. Patient currently clinically and biochemically hyperthyroid.  Discussed the etiology for hyperthyroidism. Educated on thyroid axis.  Recommend the following: Take methimazole 5 mg twice a day. Will adjust dose based on labs. Repeat labs in 3 months or sooner if symptoms of hyper or hypothyroidism develop.  -complications of untreated hyperthyroidism include atrial fibrillation, heart failure and osteoporosis -side effects of Methimazole include but not limited to allergic reaction, rash, bone marrow suppression, liver dysfunction and teratogenic potential -implications in pregnancy and breastfeeding -compliance and follow up needs    Left eye proptosis noted since 06/2023 Ordered referral for ophthalmologist  I have reviewed current medications, nurse's notes, allergies, vital signs, past medical and surgical history, family medical history, and social history for this encounter. Counseled patient on symptoms, examination findings, lab findings, imaging results, treatment decisions and monitoring and prognosis. The patient understood the recommendations and agrees with the treatment plan. All questions regarding treatment plan were fully answered.   Return in about 6 weeks (around 10/08/2023) for visit + labs before next visit, labs today.   Scott Butler, MD  08/27/23   I have reviewed current  medications, nurse's notes, allergies, vital signs, past medical and surgical history, family medical history, and social history for this encounter. Counseled patient on symptoms, examination findings, lab findings, imaging results, treatment decisions and monitoring and prognosis. The patient understood the recommendations and agrees with the treatment plan. All questions regarding treatment plan were fully answered.   History of Present Illness Scott Moss is a 48 y.o. year old male who presents to our clinic with hyperthyroidism diagnosed in 2025.    Left eye became bulgy around 06/2023  HR 106  Symptoms suggestive of HYPERTHYROIDISM:  weight loss  No heat intolerance Yes hyperdefecation  Yes palpitations  Yes  Compressive symptoms:  dysphagia  Yes dysphonia  Yes positional dyspnea (especially with simultaneous arms elevation)  Yes  Smokes  No On biotin  No Personal history of head/neck surgery/irradiation  Yes, spinal surgery  Adverse Drug Effects from Methimazole (MMI): rash No fever No throat pain No arthritis No mouth ulcers No jaundice No loss of appetite No lymphadenopathy No  Grave's Ophthalmopathy Clinical Activity Score: 0/9, but evident Left eye proptosis  Physical Exam  BP 120/70   Pulse (!) 106   Ht 5\' 9"  (1.753 m)   Wt 221 lb 9.6 oz (100.5 kg)   SpO2 97%   BMI 32.72 kg/m  Constitutional: well developed, well nourished Head: normocephalic, atraumatic, + L eye exophthalmos Eyes: sclera anicteric, no redness Neck: no thyromegaly, no thyroid tenderness; no nodules palpated Lungs: normal respiratory effort Neurology: alert and oriented, + fine hand tremor Skin: dry, no appreciable rashes Musculoskeletal: no appreciable defects Psychiatric: normal mood and affect  Allergies Allergies  Allergen Reactions   Benicar [Olmesartan]  Rash, allergic reaction    Current Medications Patient's Medications  New Prescriptions   No medications on  file  Previous Medications   ACETAMINOPHEN (TYLENOL) 500 MG TABLET    Take 500 mg by mouth every 6 (six) hours as needed for mild pain.   ASPIRIN EC (ASPIRIN LOW DOSE) 81 MG TABLET    TAKE 1 TABLET(81 MG) BY MOUTH DAILY. SWALLOW WHOLE   DILTIAZEM (CARDIZEM CD) 120 MG 24 HR CAPSULE    Take 1 capsule (120 mg total) by mouth daily.   FAMOTIDINE (PEPCID) 20 MG TABLET    Take 1 tablet (20 mg total) by mouth daily.   FEXOFENADINE (ALLEGRA) 180 MG TABLET    Take 180 mg by mouth daily.   METHIMAZOLE (TAPAZOLE) 5 MG TABLET    Take 1 tablet (5 mg total) by mouth 2 (two) times daily.   OXYBUTYNIN (DITROPAN) 5 MG TABLET    TAKE 1 TABLET(5 MG) BY MOUTH TWICE DAILY   ROSUVASTATIN (CRESTOR) 10 MG TABLET    TAKE 1 TABLET(10 MG) BY MOUTH DAILY   VALSARTAN-HYDROCHLOROTHIAZIDE (DIOVAN-HCT) 160-12.5 MG TABLET    TAKE 1 TABLET BY MOUTH DAILY  Modified Medications   No medications on file  Discontinued Medications   No medications on file    Past Medical History Past Medical History:  Diagnosis Date   Allergy    Arthritis    Atypical chest pain 07/06/2015   Non-obstructive CAD on [facility] 04/2010.   Chondromalacia of left knee 09/2012   Complication of anesthesia    Family history of cancer    multiple family members, multiple types.   Sees Dr. Myna Hidalgo   Family history of cancer    Family history of premature CAD    GERD (gastroesophageal reflux disease)    H/O cardiovascular stress test 07/14/2015   myocardial perfusion scan, normal, EF 45-54% mildy reduced LV function, Dr. Duke Salvia   H/O echocardiogram 07/18/2015   LV mild hypertrophy, 55-60%EF, Dr. Duke Salvia   High cholesterol    Hypertension    Obesity    Overweight    PONV (postoperative nausea and vomiting)    Testicular cancer (HCC) 02/2005   Dr. Myna Hidalgo    Past Surgical History Past Surgical History:  Procedure Laterality Date   ANTERIOR CERVICAL DECOMP/DISCECTOMY FUSION  11/11/2008   C5-6   APPENDECTOMY     CARDIAC CATHETERIZATION   04/13/2010   no disease   CERVICAL SPINE SURGERY  08/05/2013   Dr. Lelon Perla   COLONOSCOPY     prior, no problems per patient, year unknown, possibly in early 30s.   HIP ARTHROPLASTY Left 03/2020   HIP ARTHROPLASTY Right 04/2020   KNEE ARTHROSCOPY Right    KNEE ARTHROSCOPY Left 10/19/2015   Procedure: LEFT KNEE SCOPE CHONDROPLASTY;  Surgeon: Loreta Ave, MD;  Location: [facility];  Service: Orthopedics;  Laterality: Left;   KNEE ARTHROSCOPY WITH LATERAL RELEASE Left 09/18/2012   Procedure: KNEE ARTHROSCOPY WITH LATERAL RELEASE;  Surgeon: Loreta Ave, MD;  Location: [facility];  Service: Orthopedics;  Laterality: Left;  WITH DEBRIDEMENT AND SHAVING(CHONDROPLASTY), Excision Medial [place]lic[place]   NASAL SINUS SURGERY  08/05/2012   PARTIAL KNEE ARTHROPLASTY Left 08/15/2016   Procedure: PATELLA FEMORAL REPLACEMENT LEFT KNEE;  Surgeon: Loreta Ave, MD;  Location: [facility];  Service: Orthopedics;  Laterality: Left;   RADICAL ORCHIECTOMY  02/07/2005   left; with left testicular implant    Family History family history includes Cancer in his father  and mother; Cancer (age of onset: 41) in his sister; Colon cancer in his maternal uncle; Diabetes in his maternal uncle; Esophageal cancer in his maternal uncle; Heart attack in his maternal uncle and paternal uncle; Heart attack (age of onset: 78) in his father; Heart disease in his father; Hypertension in his father, mother, and sister; Lung cancer in his father; Migraines in his mother and sister; Ovarian cancer in his mother; Skin cancer in his father; Stroke in his mother.  Social History Social History   Socioeconomic History   Marital status: Married    Spouse name: Not on file   Number of children: 2   Years of education: Not on file   Highest education level: Not on file  Occupational History   Occupation: truck driver  Tobacco Use   Smoking status: Never    Passive exposure: Never    Smokeless tobacco: Never   Tobacco comments:    never used tobacco  Vaping Use   Vaping status: Never Used  Substance and Sexual Activity   Alcohol use: Yes    Alcohol/week: 3.0 standard drinks of alcohol    Types: 3 Cans of beer per week    Comment: social   Drug use: No   Sexual activity: Yes  Other Topics Concern   Not on file  Social History Narrative   Married, 2 children, is a Naval architect for [employer].   08/2022      Social Drivers of Health   Financial Resource Strain: Not on file  Food Insecurity: Not on file  Transportation Needs: Not on file  Physical Activity: Not on file  Stress: Not on file  Social Connections: Not on file  Intimate Partner Violence: Not on file    Laboratory Investigations Lab Results  Component Value Date   TSH <0.005 (L) 08/18/2023   TSH 0.49 01/06/2023   TSH 0.647 08/16/2022   FREET4 3.14 (H) 08/18/2023     No results found for: "TSI"   No components found for: "TRAB"   Lab Results  Component Value Date   CHOL 115 08/20/2023   Lab Results  Component Value Date   HDL 45 08/20/2023   Lab Results  Component Value Date   LDLCALC 58 08/20/2023   Lab Results  Component Value Date   TRIG 54 08/20/2023   Lab Results  Component Value Date   CHOLHDL 2.6 08/20/2023   Lab Results  Component Value Date   CREATININE 0.78 08/20/2023   Lab Results  Component Value Date   GFR 94.61 01/06/2023      Component Value Date/Time   NA 138 08/20/2023 1107   K 4.8 08/20/2023 1107   CL 102 08/20/2023 1107   CO2 19 (L) 08/20/2023 1107   GLUCOSE 139 (H) 08/20/2023 1107   GLUCOSE 96 01/06/2023 0903   BUN 15 08/20/2023 1107   CREATININE 0.78 08/20/2023 1107   CREATININE 1.02 02/06/2017 0659   CALCIUM 9.6 08/20/2023 1107   PROT 6.8 08/20/2023 1107   ALBUMIN 4.1 08/20/2023 1107   AST 22 08/20/2023 1107   ALT 29 08/20/2023 1107   ALKPHOS 135 (H) 08/20/2023 1107   BILITOT 0.8 08/20/2023 1107   GFRNONAA >60 12/17/2022 1855   GFRAA  106 12/16/2019 1106      Latest Ref Rng & Units 08/20/2023   11:07 AM 01/06/2023    9:03 AM 12/17/2022    6:55 PM  BMP  Glucose 70 - 99 mg/dL 409  96  92  BUN 6 - 24 mg/dL 15  15  18    Creatinine 0.76 - 1.27 mg/dL 1.61  0.96  0.45   BUN/Creat Ratio 9 - 20 19     Sodium 134 - 144 mmol/L 138  138  135   Potassium 3.5 - 5.2 mmol/L 4.8  3.9  3.8   Chloride 96 - 106 mmol/L 102  103  101   CO2 20 - 29 mmol/L 19  26  24    Calcium 8.7 - 10.2 mg/dL 9.6  9.3  8.9        Component Value Date/Time   WBC 5.9 08/18/2023 1341   WBC 5.9 01/06/2023 0903   RBC 5.22 08/18/2023 1341   RBC 5.21 01/06/2023 0903   HGB 15.0 08/18/2023 1341   HGB 16.4 06/20/2014 1026   HGB 16.4 06/04/2007 0755   HCT 44.9 08/18/2023 1341   HCT 47.1 06/20/2014 1026   HCT 46.3 06/04/2007 0755   PLT 331 08/18/2023 1341   MCV 86 08/18/2023 1341   MCV 87 06/20/2014 1026   MCV 88.2 06/04/2007 0755   MCH 28.7 08/18/2023 1341   MCH 30.3 12/17/2022 1855   MCHC 33.4 08/18/2023 1341   MCHC 32.9 01/06/2023 0903   RDW 12.2 08/18/2023 1341   RDW 11.9 06/20/2014 1026   RDW 13.0 06/04/2007 0755   LYMPHSABS 1.7 08/18/2023 1341   LYMPHSABS 2.1 06/20/2014 1026   LYMPHSABS 1.4 06/04/2007 0755   MONOABS 0.6 01/06/2023 0903   MONOABS 0.4 06/04/2007 0755   EOSABS 0.1 08/18/2023 1341   EOSABS 0.2 06/20/2014 1026   BASOSABS 0.0 08/18/2023 1341   BASOSABS 0.1 06/20/2014 1026   BASOSABS 0.0 06/04/2007 0755      Parts of this note may have been dictated using voice recognition software. There may be variances in spelling and vocabulary which are unintentional. Not all errors are proofread. Please notify the Thereasa Parkin if any discrepancies are noted or if the meaning of any statement is not clear.

## 2023-08-27 NOTE — Patient Instructions (Signed)
If you notice any symptoms of worsening fatigue, fever with sore throat, loss of appetite, yellowing of eyes, dark urine, joint pains, sores in the mouth, itchy rash, light colored stools or abdominal pain, please stop the medication and call us immediately as this can be a serious side effect of the medication.

## 2023-08-28 ENCOUNTER — Other Ambulatory Visit: Payer: Self-pay | Admitting: "Endocrinology

## 2023-08-28 ENCOUNTER — Encounter: Payer: Self-pay | Admitting: "Endocrinology

## 2023-08-28 DIAGNOSIS — E0590 Thyrotoxicosis, unspecified without thyrotoxic crisis or storm: Secondary | ICD-10-CM

## 2023-08-28 MED ORDER — METHIMAZOLE 10 MG PO TABS: 10.0000 mg | ORAL_TABLET | Freq: Three times a day (TID) | ORAL | 1 refills | Status: DC

## 2023-08-29 ENCOUNTER — Telehealth: Payer: Self-pay | Admitting: Internal Medicine

## 2023-08-29 LAB — T4, FREE: Free T4: 2.1 ng/dL — ABNORMAL HIGH (ref 0.8–1.8)

## 2023-08-29 LAB — TRAB (TSH RECEPTOR BINDING ANTIBODY): TRAB: 6.83 [IU]/L — ABNORMAL HIGH (ref <=2.00)

## 2023-08-29 LAB — T3, FREE: T3, Free: 8.6 pg/mL — ABNORMAL HIGH (ref 2.3–4.2)

## 2023-08-29 LAB — TSH: TSH: <0.01 m[IU]/L — ABNORMAL LOW (ref 0.40–4.50)

## 2023-08-29 LAB — THYROID STIMULATING IMMUNOGLOBULIN: TSI: 282 % baseline — ABNORMAL HIGH (ref <140)

## 2023-08-29 NOTE — Telephone Encounter (Signed)
Pt called and left a voicemail stating Endo upped his medication for methimazole to 10mg  3 times a day but didn't up the diltiazem or do anything with that. Does this need to be upped as well.   Left a message for pt to call back to find out what his pulse has been as well.

## 2023-08-31 ENCOUNTER — Encounter (INDEPENDENT_AMBULATORY_CARE_PROVIDER_SITE_OTHER): Payer: Self-pay | Admitting: Otolaryngology

## 2023-09-01 NOTE — Telephone Encounter (Signed)
Will elevate to 107-115 and then go down to like 94. He thinks he got a high 70s reading over the weekend as that was his lowest reading

## 2023-09-02 ENCOUNTER — Other Ambulatory Visit: Payer: Self-pay | Admitting: Medical

## 2023-09-02 MED ORDER — DILTIAZEM HCL ER COATED BEADS 180 MG PO CP24: 180.0000 mg | ORAL_CAPSULE | Freq: Every day | ORAL | 1 refills | Status: DC

## 2023-09-02 NOTE — Telephone Encounter (Signed)
Pt was notified of results.   Pt states that he is extremely fatigue all the time. He doesn't see good at night. Maybe 3 hours at once. I advised him this could be related to his thyroid but I would double check with you about why the could be tired all the time

## 2023-09-02 NOTE — Telephone Encounter (Signed)
Scott Moss,  Can you get him in for Afib management w/ DOD in the next 24-48hr?   Vincenza Hews,  Thanks for reaching out. I will try my best.  But in the meantime -transition him from diltiazem to propranolol 20 mg p.o. 3 times daily with holding parameters (hold if systolic blood pressure less than 100 mmHg or heart rate less than 55bpm).  A-fib is difficult to manage in hyperthyroid state and sometimes patients develop tachycardia mediated cardiomyopathy (i.e. develop reduced LVEF).  Therefore if no contraindication would transition him from calcium channel blockers to beta-blockers until we can arrange outpatient follow-up. As always if he is becoming symptomatic should go to the ER for further evaluation and management.  Dr. Odis Hollingshead

## 2023-09-03 ENCOUNTER — Other Ambulatory Visit: Payer: Self-pay | Admitting: Medical

## 2023-09-03 ENCOUNTER — Encounter: Payer: Self-pay | Admitting: Internal Medicine

## 2023-09-03 MED ORDER — PROPRANOLOL HCL 20 MG PO TABS: 20.0000 mg | ORAL_TABLET | Freq: Three times a day (TID) | ORAL | 1 refills | Status: DC

## 2023-09-03 NOTE — Telephone Encounter (Signed)
Pt was notified and sent him a mychart message since he is out of town so he can re-read this

## 2023-09-03 NOTE — Telephone Encounter (Signed)
Trying this out first

## 2023-09-03 NOTE — Telephone Encounter (Signed)
Triage,   Please see the message above.   Dr. Odis Hollingshead

## 2023-09-04 NOTE — Telephone Encounter (Signed)
Spoke with pt and DOD appt made for 1/31 for afib management.

## 2023-09-05 ENCOUNTER — Ambulatory Visit: Payer: BC Managed Care – PPO | Attending: Cardiology | Admitting: Cardiology

## 2023-09-05 ENCOUNTER — Encounter: Payer: Self-pay | Admitting: Cardiology

## 2023-09-05 VITALS — BP 122/66 | HR 88 | Resp 16 | Ht 69.0 in | Wt 228.8 lb

## 2023-09-05 DIAGNOSIS — I700 Atherosclerosis of aorta: Secondary | ICD-10-CM

## 2023-09-05 DIAGNOSIS — Z8249 Family history of ischemic heart disease and other diseases of the circulatory system: Secondary | ICD-10-CM | POA: Diagnosis not present

## 2023-09-05 DIAGNOSIS — E782 Mixed hyperlipidemia: Secondary | ICD-10-CM

## 2023-09-05 DIAGNOSIS — I4891 Unspecified atrial fibrillation: Secondary | ICD-10-CM

## 2023-09-05 DIAGNOSIS — I4819 Other persistent atrial fibrillation: Secondary | ICD-10-CM | POA: Diagnosis not present

## 2023-09-05 DIAGNOSIS — I10 Essential (primary) hypertension: Secondary | ICD-10-CM

## 2023-09-05 MED ORDER — FUROSEMIDE 20 MG PO TABS: 20.0000 mg | ORAL_TABLET | Freq: Every morning | ORAL | 3 refills | Status: DC

## 2023-09-05 MED ORDER — VALSARTAN 160 MG PO TABS: 160.0000 mg | ORAL_TABLET | Freq: Every day | ORAL | 3 refills | Status: DC

## 2023-09-05 MED ORDER — APIXABAN 5 MG PO TABS: 5.0000 mg | ORAL_TABLET | Freq: Two times a day (BID) | ORAL | 3 refills | Status: DC

## 2023-09-05 NOTE — Patient Instructions (Addendum)
Medication Instructions:  Your physician has recommended you make the following change in your medication:   STOP valsartan-hydrochlorothiazide  START Eliquis 5 mg twice daily   START Furosemide (Lasix) 20 mg once daily in the morning  START Valsartan 160 mg once daily in the evening   *If you need a refill on your cardiac medications before your next appointment, please call your pharmacy*  Lab Work: To be completed today: Hemoglobin/hematocrit, BMP, Pro-BNP, and Magnesium  To be completed in 1 week: BMP If you have labs (blood work) drawn today and your tests are completely normal, you will receive your results only by: MyChart Message (if you have MyChart) OR A paper copy in the mail If you have any lab test that is abnormal or we need to change your treatment, we will call you to review the results.  Testing/Procedures: Your physician has requested that you have an echocardiogram. Echocardiography is a painless test that uses sound waves to create images of your heart. It provides your doctor with information about the size and shape of your heart and how well your heart's chambers and valves are working. This procedure takes approximately one hour. There are no restrictions for this procedure. Please do NOT wear cologne, perfume, aftershave, or lotions (deodorant is allowed). Please arrive 15 minutes prior to your appointment time.  Please note: We ask at that you not bring children with you during ultrasound (echo/ vascular) testing. Due to room size and safety concerns, children are not allowed in the ultrasound rooms during exams. Our front office staff cannot provide observation of children in our lobby area while testing is being conducted. An adult accompanying a patient to their appointment will only be allowed in the ultrasound room at the discretion of the ultrasound technician under special circumstances. We apologize for any inconvenience.   Follow-Up: At [facility],  you and your health needs are our priority.  As part of our continuing mission to provide you with exceptional heart care, we have created designated Provider Care Teams.  These Care Teams include your primary Cardiologist (physician) and Advanced Practice Providers (APPs -  Physician Assistants and Nurse Practitioners) who all work together to provide you with the care you need, when you need it.  We recommend signing up for the patient portal called "MyChart".  Sign up information is provided on this After Visit Summary.  MyChart is used to connect with patients for Virtual Visits (Telemedicine).  Patients are able to view lab/test results, encounter notes, upcoming appointments, etc.  Non-urgent messages can be sent to your provider as well.   To learn more about what you can do with MyChart, go to [email or web address].    Your next appointment:   7 week(s)  The format for your next appointment:   In Person  Provider:   Tessa Lerner, DO {

## 2023-09-05 NOTE — Progress Notes (Unsigned)
Cardiology Office Note:  .   ID:  Scott Moss, DOB 1976/01/25, MRN [id number] PCP:  Genia Del  Former Cardiology Providers: N/A [facility] Providers Cardiologist:  Tessa Lerner, DO , [facility] (established care 09/05/23) Electrophysiologist:  None  Click to update primary MD,subspecialty MD or APP then REFRESH:1}    Chief Complaint  Patient presents with   New Patient (Initial Visit)    New onset of A-fib    History of Present Illness: .   Scott Moss is a 48 y.o. Caucasian male whose past medical history and cardiovascular risk factors includes: Premature coronary artery disease family, hypertension, hyperlipidemia, hyperthyroidism aortic atherosclerosis.  Patient was referred to practice by PCP for new diagnosis of Afib in the setting of hyperthyroidism.  Patient is accompanied by his wife at today's office visit.  Patient was recently diagnosed with hyperthyroidism and suspected Graves' disease.  He did follow-up with endocrinology and was started on methimazole and due to A-fib with RVR patient was referred to the practice in urgent basis.  Patient was worked in as a sick visit.  He was recently diagnosed with atrial fibrillation, suspected to be related to his thyroid condition. His heart rate previously reached up to 150 beats per minute. He was initially on diltiazem, and I recommended it to be changed to propranolol, which he has been taking for one day at a dose of 20 mg three times a day. His current heart rate is 90 beats per minute, down from 150 bpm two days ago. He notes some improvement with the new medication.  He denies anginal chest pain.  But does have shortness of breath with effort related activities.  He denies any near-syncope or syncopal events.  No prior history of gastrointestinal intracranial bleeding.  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 He smokes and drinks alcohol, consuming about a  twelve-pack over a week and a half, with reduced intake recently due to medication concerns. He denies recreational drug use.  Dad had MI at age of 34 which required coronary interventions.   Review of Systems: .   Review of Systems  Cardiovascular:  Positive for dyspnea on exertion. Negative for chest pain, claudication, irregular heartbeat, leg swelling, near-syncope, orthopnea, palpitations, paroxysmal nocturnal dyspnea and syncope.  Respiratory:  Negative for shortness of breath.   Hematologic/Lymphatic: Negative for bleeding problem.    Studies Reviewed:   EKG: EKG Interpretation Date/Time:  Friday September 05 2023 15:21:20 EST Ventricular Rate:  90 PR Interval:    QRS Duration:  90 QT Interval:  352 QTC Calculation: 430 R Axis:   64  Text Interpretation: Atrial fibrillation When compared with ECG of 06-Jun-2017 09:40, Atrial fibrillation has replaced Sinus rhythm Confirmed by Tessa Lerner [phone]) on 09/05/2023 3:48:04 PM  Echocardiogram: 07/18/2015: LVEF 55-60%, mild LVH, normal diastolic parameters, no significant valvular heart disease.   Stress Testing: 07/2015: The left ventricular ejection fraction is mildly decreased (45-54%). Nuclear stress EF: 51%. There was no ST segment deviation noted during stress The study is normal. This is a low risk study.  RADIOLOGY: NA  Risk Assessment/Calculations:   Click Here to Calculate/Change CHADS2VASc Score The patient's CHADS2-VASc score is 2, indicating a 2.2% annual risk of stroke.    Labs:       Latest Ref Rng & Units 09/05/2023    5:14 PM 08/18/2023    1:41 PM 01/06/2023    9:03 AM  CBC  WBC 3.4 -  10.8 x10E3/uL  5.9  5.9   Hemoglobin 13.0 - 17.7 g/dL 09.8  11.9  14.7   Hematocrit 37.5 - 51.0 % 46.2  44.9  47.1   Platelets 150 - 450 x10E3/uL  331  316.0        Latest Ref Rng & Units 09/05/2023    5:17 PM 09/05/2023    5:14 PM 08/20/2023   11:07 AM  BMP  Glucose 70 - 99 mg/dL 81  80  829   BUN 6 - 24 mg/dL 13   13  15    Creatinine 0.76 - 1.27 mg/dL 5.62  1.30  8.65   BUN/Creat Ratio 9 - 20 14  15  19    Sodium 134 - 144 mmol/L 142  141  138   Potassium 3.5 - 5.2 mmol/L 4.8  4.7  4.8   Chloride 96 - 106 mmol/L 105  105  102   CO2 20 - 29 mmol/L 20  22  19    Calcium 8.7 - 10.2 mg/dL 9.7  9.5  9.6       Latest Ref Rng & Units 09/05/2023    5:17 PM 09/05/2023    5:14 PM 08/20/2023   11:07 AM  CMP  Glucose 70 - 99 mg/dL 81  80  784   BUN 6 - 24 mg/dL 13  13  15    Creatinine 0.76 - 1.27 mg/dL 6.96  2.95  2.84   Sodium 134 - 144 mmol/L 142  141  138   Potassium 3.5 - 5.2 mmol/L 4.8  4.7  4.8   Chloride 96 - 106 mmol/L 105  105  102   CO2 20 - 29 mmol/L 20  22  19    Calcium 8.7 - 10.2 mg/dL 9.7  9.5  9.6   Total Protein 6.0 - 8.5 g/dL   6.8   Total Bilirubin 0.0 - 1.2 mg/dL   0.8   Alkaline Phos 44 - 121 IU/L   135   AST 0 - 40 IU/L   22   ALT 0 - 44 IU/L   29     Lab Results  Component Value Date   CHOL 115 08/20/2023   HDL 45 08/20/2023   LDLCALC 58 08/20/2023   TRIG 54 08/20/2023   CHOLHDL 2.6 08/20/2023   No results for input(s): "LIPOA" in the last 8760 hours. No components found for: "NTPROBNP" Recent Labs    09/05/23 1714  PROBNP 1,234*   Recent Labs    01/06/23 0903 08/18/23 1341 08/27/23 0834  TSH 0.49 <0.005* <0.01*     Physical Exam:    Today's Vitals   09/05/23 1517  BP: 122/66  Pulse: 88  Resp: 16  SpO2: 95%  Weight: 228 lb 12.8 oz (103.8 kg)  Height: 5\' 9"  (1.753 m)   Body mass index is 33.79 kg/m. Wt Readings from Last 3 Encounters:  09/05/23 228 lb 12.8 oz (103.8 kg)  08/27/23 220 lb 6.4 oz (100 kg)  08/27/23 221 lb 9.6 oz (100.5 kg)    Physical Exam  Constitutional: No distress.  hemodynamically stable  HENT:  The degree of exophthalmos in the left eye  Neck: No JVD present.  Cardiovascular: Normal rate, S1 normal and S2 normal. An irregularly irregular rhythm present. Exam reveals no gallop, no S3 and no S4.  No murmur  heard. Pulmonary/Chest: Effort normal and breath sounds normal. No stridor. He has no wheezes. He has no rales.  Abdominal: Soft. Bowel sounds are normal. He exhibits no  distension. There is no abdominal tenderness.  Musculoskeletal:        General: No edema.     Cervical back: Neck supple.  Neurological: He is alert and oriented to person, place, and time. He has intact cranial nerves (2-12).  Skin: Skin is warm.     Impression & Recommendation(s):  Impression:   ICD-10-CM   1. New onset a-fib (HCC)  I48.91 EKG 12-Lead    valsartan (DIOVAN) 160 MG tablet    apixaban (ELIQUIS) 5 MG TABS tablet    furosemide (LASIX) 20 MG tablet    Basic metabolic panel    Hemoglobin and hematocrit, blood    Magnesium    Pro b natriuretic peptide (BNP)    Pro b natriuretic peptide (BNP)    Magnesium    Hemoglobin and hematocrit, blood    Basic metabolic panel    Basic metabolic panel    Basic metabolic panel    ECHOCARDIOGRAM COMPLETE    2. Benign hypertension  I10     3. Atherosclerosis of aorta (HCC)  I70.0     4. Mixed hyperlipidemia  E78.2     5. Family history of premature CAD  Z82.49        Recommendation(s):  New onset a-fib (HCC) Newly discovered atrial fibrillation in the setting of hyperthyroidism. Currently on methimazole & being followed by endocrinology. Discontinued diltiazem and transitioned the patient to propranolol in the setting of hyperthyroidism.  This will also help if he develops tachycardia induced cardiomyopathy.  Patient endorses that the ventricular rate has improved since the transition to propranolol. I performed a point-of-care ultrasound at today's office visit and his LVEF is mildly reduced.  Will obtain a formal echocardiogram. Since he is also experiencing dyspnea on exertion and is at high risk of developing heart failure we will discontinue valsartan/HCTZ combination. Will restart valsartan at 160 mg p.o. every afternoon. Initiate Lasix 20 mg p.o.  every morning. Will obtain baseline labs: BMP, NT proBNP, magnesium Given the duration of his A-fib in the setting of hyperthyroidism and his chads Vascor we will start Eliquis 5 mg p.o. twice daily for thromboembolic prophylaxis.  Long-term anticoagulation will need to be discussed once his thyroid function is better controlled and he is back in sinus rhythm.  Risks, benefits, and alternatives to long-term anticoagulation discussed with the patient and his wife at today's visit. The patient and wife are educated that if he has worsening dyspnea on exertion or heart failure symptoms he should go to the hospital for further evaluation and management.  Avoid the use of amiodarone as this will expose him to high iodine concentration and may worsen his hyperthyroidism. As long as the ventricular rate is well-controlled we will continue with keeping him in A-fib for now until her thyroid condition is well-controlled.  I am hopeful that once his thyroid function is back to baseline he should convert back to sinus rhythm; however, if he does not we will discuss the modalities such as antiarrhythmics plus or minus cardioversion to restore sinus rhythm.    Benign hypertension Transitioning from valsartan/HCTZ to valsartan as discussed above. Office and home blood pressures are now better controlled. Earlier this week when he was in A-fib with RVR patient was experiencing soft blood pressures per wife  Atherosclerosis of aorta (HCC) Family history of premature CAD Will discontinue aspirin 81 mg p.o. daily as were starting anticoagulation for reasons mentioned above. Patient has undergone appropriate ischemic workup in the past.  Mixed hyperlipidemia Currently on  Crestor 10 mg p.o. daily.   He denies myalgia or other side effects. Most recent lipids dated August 20, 2023, independently reviewed as noted above.  LDL is 58, well-controlled.  Cardiology is following peripherally.  Orders Placed:  Orders  Placed This Encounter  Procedures   Basic metabolic panel    Standing Status:   Future    Number of Occurrences:   1    Expected Date:   09/05/2023    Expiration Date:   09/04/2024   Hemoglobin and hematocrit, blood    Standing Status:   Future    Number of Occurrences:   1    Expected Date:   09/05/2023    Expiration Date:   09/04/2024   Magnesium    Standing Status:   Future    Number of Occurrences:   1    Expected Date:   09/05/2023    Expiration Date:   09/04/2024   Pro b natriuretic peptide (BNP)    Standing Status:   Future    Number of Occurrences:   1    Expected Date:   09/05/2023    Expiration Date:   09/04/2024   Basic metabolic panel    Standing Status:   Future    Number of Occurrences:   1    Expected Date:   09/12/2023    Expiration Date:   09/04/2024   EKG 12-Lead   ECHOCARDIOGRAM COMPLETE    Standing Status:   Future    Expected Date:   09/12/2023    Expiration Date:   09/04/2024    Where should this test be performed:   [facility])    Does the patient weigh less than or greater than 250 lbs?:   Patient weighs less than 250 lbs    Perflutren DEFINITY (image enhancing agent) should be administered unless hypersensitivity or allergy exist:   Administer Perflutren    Reason for exam-Echo:   Other-Full Diagnosis List    Full ICD-10/Reason for Exam:   A-fib [facility]) [id number]   Patient was worked in as a sick visit for newly discovered atrial fibrillation in the setting of hyperthyroidism. Performed point-of-care ultrasound to evaluate the LVEF as I was concerned that he may have developed tachycardia induced cardiomyopathy in the setting of hyperthyroidism and A-fib with RVR. Discussed the management and pathophysiology of atrial fibrillation with patient and wife. We discussed the risks, benefits, and alternatives to oral anticoagulation based on his CHA2DS2-VASc score. Medication profile for anticoagulation discussed Ordered labs to evaluate for renal  function, fluid markers, hemoglobin and electrolytes.  He will have follow-up labs in 1 week after initiating Lasix. Indications to when to seek medical attention by going to the closest ER via EMS discussed. Also discussing with his primary care provider Crosby Oyster with regards to his care, see EMR  Final Medication List:    Meds ordered this encounter  Medications   valsartan (DIOVAN) 160 MG tablet    Sig: Take 1 tablet (160 mg total) by mouth at bedtime.    Dispense:  90 tablet    Refill:  3   apixaban (ELIQUIS) 5 MG TABS tablet    Sig: Take 1 tablet (5 mg total) by mouth 2 (two) times daily.    Dispense:  180 tablet    Refill:  3   furosemide (LASIX) 20 MG tablet    Sig: Take 1 tablet (20 mg total) by mouth in the morning.    Dispense:  90  tablet    Refill:  3    Medications Discontinued During This Encounter  Medication Reason   valsartan-hydrochlorothiazide (DIOVAN-HCT) 160-12.5 MG tablet Discontinued by provider   oxybutynin (DITROPAN) 5 MG tablet Patient Preference   aspirin EC (ASPIRIN LOW DOSE) 81 MG tablet Patient Preference     Current Outpatient Medications:    acetaminophen (TYLENOL) 500 MG tablet, Take 500 mg by mouth every 6 (six) hours as needed for mild pain., Disp: , Rfl:    apixaban (ELIQUIS) 5 MG TABS tablet, Take 1 tablet (5 mg total) by mouth 2 (two) times daily., Disp: 180 tablet, Rfl: 3   famotidine (PEPCID) 20 MG tablet, Take 1 tablet (20 mg total) by mouth daily., Disp: 180 tablet, Rfl: 3   fexofenadine (ALLEGRA) 180 MG tablet, Take 180 mg by mouth daily., Disp: , Rfl:    furosemide (LASIX) 20 MG tablet, Take 1 tablet (20 mg total) by mouth in the morning., Disp: 90 tablet, Rfl: 3   methimazole (TAPAZOLE) 10 MG tablet, Take 1 tablet (10 mg total) by mouth 3 (three) times daily., Disp: 270 tablet, Rfl: 1   propranolol (INDERAL) 20 MG tablet, Take 1 tablet (20 mg total) by mouth 3 (three) times daily., Disp: 90 tablet, Rfl: 1   rosuvastatin (CRESTOR)  10 MG tablet, TAKE 1 TABLET(10 MG) BY MOUTH DAILY, Disp: 90 tablet, Rfl: 0   valsartan (DIOVAN) 160 MG tablet, Take 1 tablet (160 mg total) by mouth at bedtime., Disp: 90 tablet, Rfl: 3  Consent:   NA  Disposition:   7 weeks sooner if needed Patient may be asked to follow-up sooner based on the results of the above-mentioned testing.  His questions and concerns were addressed to his satisfaction. He voices understanding of the recommendations provided during this encounter.    Signed, Tessa Lerner, DO, [facility]  [facility]  [address] [place], [place] [address] 09/06/2023 2:01 PM

## 2023-09-05 NOTE — Telephone Encounter (Signed)
Scott Moss,   I saw him and his wife today. You will get the note soon.   Scott Billiot Mehama, DO, [facility]

## 2023-09-06 ENCOUNTER — Encounter: Payer: Self-pay | Admitting: Cardiology

## 2023-09-06 LAB — BASIC METABOLIC PANEL
BUN/Creatinine Ratio: 14 (ref 9–20)
BUN/Creatinine Ratio: 15 (ref 9–20)
BUN: 13 mg/dL (ref 6–24)
BUN: 13 mg/dL (ref 6–24)
CO2: 20 mmol/L (ref 20–29)
CO2: 22 mmol/L (ref 20–29)
Calcium: 9.7 mg/dL (ref 8.7–10.2)
Calcium: 9.5 mg/dL (ref 8.7–10.2)
Chloride: 105 mmol/L (ref 96–106)
Chloride: 105 mmol/L (ref 96–106)
Creatinine, Ser: 0.93 mg/dL (ref 0.76–1.27)
Creatinine, Ser: 0.85 mg/dL (ref 0.76–1.27)
Glucose: 81 mg/dL (ref 70–99)
Glucose: 80 mg/dL (ref 70–99)
Potassium: 4.8 mmol/L (ref 3.5–5.2)
Potassium: 4.7 mmol/L (ref 3.5–5.2)
Sodium: 142 mmol/L (ref 134–144)
Sodium: 141 mmol/L (ref 134–144)
eGFR: 101 mL/min/{1.73_m2} (ref >59)
eGFR: 107 mL/min/{1.73_m2} (ref >59)

## 2023-09-06 LAB — HEMOGLOBIN AND HEMATOCRIT, BLOOD
Hematocrit: 46.2 % (ref 37.5–51.0)
Hemoglobin: 15.5 g/dL (ref 13.0–17.7)

## 2023-09-06 LAB — MAGNESIUM: Magnesium: 2.1 mg/dL (ref 1.6–2.3)

## 2023-09-06 LAB — PRO B NATRIURETIC PEPTIDE: NT-Pro BNP: 1234 pg/mL — ABNORMAL HIGH (ref 0–121)

## 2023-09-07 ENCOUNTER — Other Ambulatory Visit: Payer: Self-pay

## 2023-09-07 DIAGNOSIS — I4891 Unspecified atrial fibrillation: Secondary | ICD-10-CM

## 2023-09-08 ENCOUNTER — Telehealth: Payer: Self-pay

## 2023-09-08 NOTE — Telephone Encounter (Signed)
Spoke with pt's wife per DPR on file and verified that pt had stopped aspirin daily since starting Eliquis at last OV. Pt's wife stated pt has not taken it since starting Eliquis.

## 2023-09-08 NOTE — Telephone Encounter (Signed)
-----   Message from [facility] sent at 09/06/2023  1:56 PM EST ----- Saw the patient on 09/05/2023.  Please call and make sure that he is not taking aspirin 81 mg p.o. daily as we started him on Eliquis for A-fib.  I did talk to him during the office visit but forgot to tell you at the time of discharge.  Sunit Long Barn, DO, [facility]

## 2023-09-11 DIAGNOSIS — E782 Mixed hyperlipidemia: Secondary | ICD-10-CM | POA: Diagnosis not present

## 2023-09-11 DIAGNOSIS — I10 Essential (primary) hypertension: Secondary | ICD-10-CM | POA: Diagnosis not present

## 2023-09-11 DIAGNOSIS — R7989 Other specified abnormal findings of blood chemistry: Secondary | ICD-10-CM | POA: Diagnosis not present

## 2023-09-11 DIAGNOSIS — I700 Atherosclerosis of aorta: Secondary | ICD-10-CM | POA: Diagnosis not present

## 2023-09-11 DIAGNOSIS — I4891 Unspecified atrial fibrillation: Secondary | ICD-10-CM | POA: Diagnosis not present

## 2023-09-12 ENCOUNTER — Encounter: Payer: Self-pay | Admitting: Cardiology

## 2023-09-12 LAB — BASIC METABOLIC PANEL
BUN/Creatinine Ratio: 9 (ref 9–20)
BUN: 8 mg/dL (ref 6–24)
CO2: 25 mmol/L (ref 20–29)
Calcium: 9.2 mg/dL (ref 8.7–10.2)
Chloride: 105 mmol/L (ref 96–106)
Creatinine, Ser: 0.89 mg/dL (ref 0.76–1.27)
Glucose: 86 mg/dL (ref 70–99)
Potassium: 4.9 mmol/L (ref 3.5–5.2)
Sodium: 143 mmol/L (ref 134–144)
eGFR: 105 mL/min/{1.73_m2} (ref >59)

## 2023-09-12 LAB — PRO B NATRIURETIC PEPTIDE: NT-Pro BNP: 605 pg/mL — ABNORMAL HIGH (ref 0–121)

## 2023-09-15 ENCOUNTER — Other Ambulatory Visit: Payer: Self-pay | Admitting: Medical

## 2023-09-16 ENCOUNTER — Ambulatory Visit: Payer: BC Managed Care – PPO | Admitting: Nurse Practitioner

## 2023-09-16 ENCOUNTER — Ambulatory Visit: Payer: BC Managed Care – PPO | Admitting: "Endocrinology

## 2023-09-24 ENCOUNTER — Ambulatory Visit (HOSPITAL_COMMUNITY): Payer: BC Managed Care – PPO | Attending: Cardiology

## 2023-09-24 DIAGNOSIS — I4891 Unspecified atrial fibrillation: Secondary | ICD-10-CM | POA: Diagnosis not present

## 2023-09-24 DIAGNOSIS — H53452 Other localized visual field defect, left eye: Secondary | ICD-10-CM | POA: Diagnosis not present

## 2023-09-24 DIAGNOSIS — H0520 Unspecified exophthalmos: Secondary | ICD-10-CM | POA: Diagnosis not present

## 2023-09-24 LAB — ECHOCARDIOGRAM COMPLETE: S' Lateral: 2.5 cm

## 2023-09-27 ENCOUNTER — Encounter: Payer: Self-pay | Admitting: Cardiology

## 2023-09-29 ENCOUNTER — Other Ambulatory Visit: Payer: Self-pay

## 2023-10-01 ENCOUNTER — Other Ambulatory Visit: Payer: BC Managed Care – PPO

## 2023-10-01 DIAGNOSIS — E0590 Thyrotoxicosis, unspecified without thyrotoxic crisis or storm: Secondary | ICD-10-CM | POA: Diagnosis not present

## 2023-10-02 ENCOUNTER — Other Ambulatory Visit: Payer: BC Managed Care – PPO

## 2023-10-02 LAB — TSH: TSH: <0.01 m[IU]/L — ABNORMAL LOW (ref 0.40–4.50)

## 2023-10-02 LAB — T4, FREE: Free T4: 0.8 ng/dL (ref 0.8–1.8)

## 2023-10-02 LAB — T3, FREE: T3, Free: 2.9 pg/mL (ref 2.3–4.2)

## 2023-10-08 ENCOUNTER — Ambulatory Visit: Payer: BC Managed Care – PPO | Admitting: "Endocrinology

## 2023-10-08 ENCOUNTER — Encounter: Payer: Self-pay | Admitting: "Endocrinology

## 2023-10-08 VITALS — BP 100/70 | HR 96 | Ht 69.0 in | Wt 232.2 lb

## 2023-10-08 DIAGNOSIS — R1310 Dysphagia, unspecified: Secondary | ICD-10-CM | POA: Diagnosis not present

## 2023-10-08 DIAGNOSIS — E0590 Thyrotoxicosis, unspecified without thyrotoxic crisis or storm: Secondary | ICD-10-CM | POA: Diagnosis not present

## 2023-10-08 MED ORDER — METHIMAZOLE 10 MG PO TABS: 10.0000 mg | ORAL_TABLET | Freq: Three times a day (TID) | ORAL | 1 refills | Status: DC

## 2023-10-08 NOTE — Progress Notes (Signed)
 Outpatient Endocrinology Note [place], MD  10/08/23   Scott Moss Jan 25, 1976 [id number]  Referring Provider: Jac Canavan, PA-C Primary Care Provider: Jac Canavan, PA-C Subjective  No chief complaint on file.   Assessment & Plan  Diagnoses and all orders for this visit:  Hyperthyroidism -     TSH -     T3, free -     T4, free  Dysphagia, unspecified type -     US THYROID; Future  Other orders -     methimazole (TAPAZOLE) 10 MG tablet; Take 1 tablet (10 mg total) by mouth 3 (three) times daily.    Scott Moss is currently taking methimazole 10 mg tid and cardizem 120 mg qd. Patient currently clinically and biochemically hyperthyroid.  Discussed the etiology for hyperthyroidism. Educated on thyroid axis.  Recommend the following: Continue methimazole 10 mg thrice a day. Will adjust dose based on labs. Repeat labs in 3 months or sooner if symptoms of hyper or hypothyroidism develop.  -complications of untreated hyperthyroidism include atrial fibrillation, heart failure and osteoporosis -side effects of Methimazole include but not limited to allergic reaction, rash, bone marrow suppression, liver dysfunction and teratogenic potential -implications in pregnancy and breastfeeding -compliance and follow up needs    Left eye proptosis noted since 06/2023 Saw ophthalmologist  History of testicular cancer Patient has dysphagia Ordered baseline thyroid U/S   I have reviewed current medications, nurse's notes, allergies, vital signs, past medical and surgical history, family medical history, and social history for this encounter. Counseled patient on symptoms, examination findings, lab findings, imaging results, treatment decisions and monitoring and prognosis. The patient understood the recommendations and agrees with the treatment plan. All questions regarding treatment plan were fully answered.   Return in about 2 months (around 12/08/2023) for  visit + labs before next visit.   [place], MD  10/08/23   I have reviewed current medications, nurse's notes, allergies, vital signs, past medical and surgical history, family medical history, and social history for this encounter. Counseled patient on symptoms, examination findings, lab findings, imaging results, treatment decisions and monitoring and prognosis. The patient understood the recommendations and agrees with the treatment plan. All questions regarding treatment plan were fully answered.   History of Present Illness Scott Moss is a 48 y.o. year old male who presents to our clinic with hyperthyroidism diagnosed in 2025.    Left eye became bulgy around 06/2023   Symptoms suggestive of HYPERTHYROIDISM:  weight loss  No heat intolerance Yes hyperdefecation  Yes palpitations  Yes  Compressive symptoms:  dysphagia  Yes dysphonia  No positional dyspnea (especially with simultaneous arms elevation)  No  Smokes  No On biotin  No Personal history of head/neck surgery/irradiation  Yes, spinal surgery  Adverse Drug Effects from Methimazole (MMI): rash No fever No throat pain No arthritis No mouth ulcers No jaundice No loss of appetite No lymphadenopathy No  Grave's Ophthalmopathy Clinical Activity Score: 0/9, but evident Left eye proptosis  Physical Exam  BP 100/70 (BP Location: Right Arm, Patient Position: Sitting)   Pulse 96   Ht 5\' 9"  (1.753 m)   Wt 232 lb 3.2 oz (105.3 kg)   SpO2 (!) 74%   BMI 34.29 kg/m  Constitutional: well developed, well nourished Head: normocephalic, atraumatic, + L eye exophthalmos Eyes: sclera anicteric, no redness Neck: no thyromegaly, no thyroid tenderness; no nodules palpated Lungs: normal respiratory effort Neurology: alert and oriented, + fine hand tremor Skin:  dry, no appreciable rashes Musculoskeletal: no appreciable defects Psychiatric: normal mood and affect  Allergies Allergies  Allergen Reactions   Benicar  [Olmesartan]     Rash, allergic reaction    Current Medications Patient's Medications  New Prescriptions   No medications on file  Previous Medications   ACETAMINOPHEN (TYLENOL) 500 MG TABLET    Take 500 mg by mouth every 6 (six) hours as needed for mild pain.   APIXABAN (ELIQUIS) 5 MG TABS TABLET    Take 1 tablet (5 mg total) by mouth 2 (two) times daily.   FAMOTIDINE (PEPCID) 20 MG TABLET    Take 1 tablet (20 mg total) by mouth daily.   FEXOFENADINE (ALLEGRA) 180 MG TABLET    Take 180 mg by mouth daily.   FUROSEMIDE (LASIX) 20 MG TABLET    Take 1 tablet (20 mg total) by mouth in the morning.   PROPRANOLOL (INDERAL) 20 MG TABLET    Take 1 tablet (20 mg total) by mouth 3 (three) times daily.   ROSUVASTATIN (CRESTOR) 10 MG TABLET    TAKE 1 TABLET(10 MG) BY MOUTH DAILY   VALSARTAN (DIOVAN) 160 MG TABLET    Take 1 tablet (160 mg total) by mouth at bedtime.  Modified Medications   Modified Medication Previous Medication   METHIMAZOLE (TAPAZOLE) 10 MG TABLET methimazole (TAPAZOLE) 10 MG tablet      Take 1 tablet (10 mg total) by mouth 3 (three) times daily.    Take 1 tablet (10 mg total) by mouth 3 (three) times daily.  Discontinued Medications   No medications on file    Past Medical History Past Medical History:  Diagnosis Date   Allergy    Arthritis    Atypical chest pain 07/06/2015   Non-obstructive CAD on [facility] 04/2010.   Chondromalacia of left knee 09/2012   Complication of anesthesia    Family history of cancer    multiple family members, multiple types.   Sees Dr. Myna Hidalgo   Family history of cancer    Family history of premature CAD    GERD (gastroesophageal reflux disease)    H/O cardiovascular stress test 07/14/2015   myocardial perfusion scan, normal, EF 45-54% mildy reduced LV function, Dr. Duke Salvia   H/O echocardiogram 07/18/2015   LV mild hypertrophy, 55-60%EF, Dr. Duke Salvia   High cholesterol    Hypertension    Obesity    Overweight    PONV (postoperative nausea  and vomiting)    Testicular cancer (HCC) 02/2005   Dr. Myna Hidalgo    Past Surgical History Past Surgical History:  Procedure Laterality Date   ANTERIOR CERVICAL DECOMP/DISCECTOMY FUSION  11/11/2008   C5-6   APPENDECTOMY     CARDIAC CATHETERIZATION  04/13/2010   no disease   CERVICAL SPINE SURGERY  08/05/2013   Dr. Lelon Perla   COLONOSCOPY     prior, no problems per patient, year unknown, possibly in early 30s.   HIP ARTHROPLASTY Left 03/2020   HIP ARTHROPLASTY Right 04/2020   KNEE ARTHROSCOPY Right    KNEE ARTHROSCOPY Left 10/19/2015   Procedure: LEFT KNEE SCOPE CHONDROPLASTY;  Surgeon: Loreta Ave, MD;  Location: [facility];  Service: Orthopedics;  Laterality: Left;   KNEE ARTHROSCOPY WITH LATERAL RELEASE Left 09/18/2012   Procedure: KNEE ARTHROSCOPY WITH LATERAL RELEASE;  Surgeon: Loreta Ave, MD;  Location:  SURGERY CENTER;  Service: Orthopedics;  Laterality: Left;  WITH DEBRIDEMENT AND SHAVING(CHONDROPLASTY), Excision Medial Plica   NASAL SINUS SURGERY  08/05/2012  PARTIAL KNEE ARTHROPLASTY Left 08/15/2016   Procedure: PATELLA FEMORAL REPLACEMENT LEFT KNEE;  Surgeon: Loreta Ave, MD;  Location: [facility];  Service: Orthopedics;  Laterality: Left;   RADICAL ORCHIECTOMY  02/07/2005   left; with left testicular implant    Family History family history includes Cancer in his father and mother; Cancer (age of onset: 84) in his sister; Colon cancer in his maternal uncle; Diabetes in his maternal uncle; Esophageal cancer in his maternal uncle; Heart attack in his maternal uncle and paternal uncle; Heart attack (age of onset: 77) in his father; Heart disease in his father; Hypertension in his father, mother, and sister; Lung cancer in his father; Migraines in his mother and sister; Ovarian cancer in his mother; Skin cancer in his father; Stroke in his mother.  Social History Social History   Socioeconomic History   Marital status:  Married    Spouse name: Not on file   Number of children: 2   Years of education: Not on file   Highest education level: Not on file  Occupational History   Occupation: truck driver  Tobacco Use   Smoking status: Never    Passive exposure: Never   Smokeless tobacco: Never   Tobacco comments:    never used tobacco  Vaping Use   Vaping status: Never Used  Substance and Sexual Activity   Alcohol use: Yes    Alcohol/week: 3.0 standard drinks of alcohol    Types: 3 Cans of beer per week    Comment: social   Drug use: No   Sexual activity: Yes  Other Topics Concern   Not on file  Social History Narrative   Married, 2 children, is a Naval architect for [employer].   08/2022      Social Drivers of Health   Financial Resource Strain: Not on file  Food Insecurity: Not on file  Transportation Needs: Not on file  Physical Activity: Not on file  Stress: Not on file  Social Connections: Not on file  Intimate Partner Violence: Not on file    Laboratory Investigations Lab Results  Component Value Date   TSH <0.01 (L) 10/01/2023   TSH <0.01 (L) 08/27/2023   TSH <0.005 (L) 08/18/2023   FREET4 0.8 10/01/2023   FREET4 2.1 (H) 08/27/2023   FREET4 3.14 (H) 08/18/2023     Lab Results  Component Value Date   TSI 282 (H) 08/27/2023     No components found for: "TRAB"   Lab Results  Component Value Date   CHOL 115 08/20/2023   Lab Results  Component Value Date   HDL 45 08/20/2023   Lab Results  Component Value Date   LDLCALC 58 08/20/2023   Lab Results  Component Value Date   TRIG 54 08/20/2023   Lab Results  Component Value Date   CHOLHDL 2.6 08/20/2023   Lab Results  Component Value Date   CREATININE 0.89 09/11/2023   Lab Results  Component Value Date   GFR 94.61 01/06/2023      Component Value Date/Time   NA 143 09/11/2023 1526   K 4.9 09/11/2023 1526   CL 105 09/11/2023 1526   CO2 25 09/11/2023 1526   GLUCOSE 86 09/11/2023 1526   GLUCOSE 96 01/06/2023  0903   BUN 8 09/11/2023 1526   CREATININE 0.89 09/11/2023 1526   CREATININE 1.02 02/06/2017 0659   CALCIUM 9.2 09/11/2023 1526   PROT 6.8 08/20/2023 1107   ALBUMIN 4.1 08/20/2023 1107   AST  22 08/20/2023 1107   ALT 29 08/20/2023 1107   ALKPHOS 135 (H) 08/20/2023 1107   BILITOT 0.8 08/20/2023 1107   GFRNONAA >60 12/17/2022 1855   GFRAA 106 12/16/2019 1106      Latest Ref Rng & Units 09/11/2023    3:26 PM 09/05/2023    5:17 PM 09/05/2023    5:14 PM  BMP  Glucose 70 - 99 mg/dL 86  81  80   BUN 6 - 24 mg/dL 8  13  13    Creatinine 0.76 - 1.27 mg/dL 4.09  8.11  9.14   BUN/Creat Ratio 9 - 20 9  14  15    Sodium 134 - 144 mmol/L 143  142  141   Potassium 3.5 - 5.2 mmol/L 4.9  4.8  4.7   Chloride 96 - 106 mmol/L 105  105  105   CO2 20 - 29 mmol/L 25  20  22    Calcium 8.7 - 10.2 mg/dL 9.2  9.7  9.5        Component Value Date/Time   WBC 5.9 08/18/2023 1341   WBC 5.9 01/06/2023 0903   RBC 5.22 08/18/2023 1341   RBC 5.21 01/06/2023 0903   HGB 15.5 09/05/2023 1714   HGB 16.4 06/20/2014 1026   HGB 16.4 06/04/2007 0755   HCT 46.2 09/05/2023 1714   HCT 47.1 06/20/2014 1026   HCT 46.3 06/04/2007 0755   PLT 331 08/18/2023 1341   MCV 86 08/18/2023 1341   MCV 87 06/20/2014 1026   MCV 88.2 06/04/2007 0755   MCH 28.7 08/18/2023 1341   MCH 30.3 12/17/2022 1855   MCHC 33.4 08/18/2023 1341   MCHC 32.9 01/06/2023 0903   RDW 12.2 08/18/2023 1341   RDW 11.9 06/20/2014 1026   RDW 13.0 06/04/2007 0755   LYMPHSABS 1.7 08/18/2023 1341   LYMPHSABS 2.1 06/20/2014 1026   LYMPHSABS 1.4 06/04/2007 0755   MONOABS 0.6 01/06/2023 0903   MONOABS 0.4 06/04/2007 0755   EOSABS 0.1 08/18/2023 1341   EOSABS 0.2 06/20/2014 1026   BASOSABS 0.0 08/18/2023 1341   BASOSABS 0.1 06/20/2014 1026   BASOSABS 0.0 06/04/2007 0755      Parts of this note may have been dictated using voice recognition software. There may be variances in spelling and vocabulary which are unintentional. Not all errors are proofread.  Please notify the Thereasa Parkin if any discrepancies are noted or if the meaning of any statement is not clear.

## 2023-10-16 ENCOUNTER — Ambulatory Visit (INDEPENDENT_AMBULATORY_CARE_PROVIDER_SITE_OTHER): Payer: BC Managed Care – PPO

## 2023-10-24 ENCOUNTER — Ambulatory Visit: Payer: BC Managed Care – PPO | Attending: Cardiology | Admitting: Cardiology

## 2023-10-24 ENCOUNTER — Ambulatory Visit (HOSPITAL_COMMUNITY)
Admission: RE | Admit: 2023-10-24 | Discharge: 2023-10-24 | Disposition: A | Discharge status: Home / Self Care | Source: Ambulatory Visit | Attending: "Endocrinology | Admitting: "Endocrinology

## 2023-10-24 VITALS — BP 120/66 | HR 76 | Resp 16 | Ht 69.0 in | Wt 232.4 lb

## 2023-10-24 DIAGNOSIS — E782 Mixed hyperlipidemia: Secondary | ICD-10-CM

## 2023-10-24 DIAGNOSIS — Z8249 Family history of ischemic heart disease and other diseases of the circulatory system: Secondary | ICD-10-CM

## 2023-10-24 DIAGNOSIS — I4891 Unspecified atrial fibrillation: Secondary | ICD-10-CM | POA: Diagnosis not present

## 2023-10-24 DIAGNOSIS — R1310 Dysphagia, unspecified: Secondary | ICD-10-CM | POA: Insufficient documentation

## 2023-10-24 DIAGNOSIS — I10 Essential (primary) hypertension: Secondary | ICD-10-CM

## 2023-10-24 DIAGNOSIS — I700 Atherosclerosis of aorta: Secondary | ICD-10-CM

## 2023-10-24 MED ORDER — PROPRANOLOL HCL ER BEADS 80 MG PO CP24: 80.0000 mg | ORAL_CAPSULE | Freq: Every morning | ORAL | 3 refills | Status: DC

## 2023-10-24 NOTE — Progress Notes (Signed)
 Cardiology Office Note:  .   ID:  Scott Moss, DOB [birth year], MRN [id number] PCP:  Genia Del  Former Cardiology Providers: N/A [facility] Providers Cardiologist:  Tessa Lerner, DO , [facility] (established care 09/05/23) Electrophysiologist:  None  Click to update primary MD,subspecialty MD or APP then REFRESH:1}    Chief Complaint  Patient presents with   Follow-up    Atrial fibrillation.    History of Present Illness: .   Scott Moss is a 48 y.o. Caucasian male whose past medical history and cardiovascular risk factors includes: Premature coronary artery disease family, hypertension, hyperlipidemia, hyperthyroidism aortic atherosclerosis.  Patient is referred to the practice for management of atrial fibrillation, new onset in the setting of hyperthyroidism.  Patient was started on methimazole and being followed by endocrinology and I had transitioned him from diltiazem to propranolol.  Given concerns for possible mild fluid overload and elevated BNP patient was started on Lasix and symptomatically has improved.  He had an echocardiogram since last office visit which notes preserved LVEF.  Clinically patient states that he is doing well from a cardiovascular standpoint denies anginal chest pain or heart failure symptoms.  He has significantly reduced alcohol consumption to no more than 3 beers per week.  In the past he used to drink a 12 pack over 1.5 weeks.  He has been followed by endocrinology and his T3-T4 improving but TSH still remains in the hyperthyroid range.  Patient plans to have an ultrasound of the thyroid in the coming weeks.  Dad had MI at age of 20 which required coronary interventions.   Review of Systems: .   Review of Systems  Cardiovascular:  Positive for dyspnea on exertion. Negative for chest pain, claudication, irregular heartbeat, leg swelling, near-syncope, orthopnea, palpitations, paroxysmal nocturnal dyspnea and syncope.  Respiratory:   Negative for shortness of breath.   Hematologic/Lymphatic: Negative for bleeding problem.    Studies Reviewed:   EKG: EKG Interpretation Date/Time:  Friday October 24 2023 08:40:44 EDT Ventricular Rate:  79 PR Interval:    QRS Duration:  90 QT Interval:  390 QTC Calculation: 447 R Axis:   50  Text Interpretation: Atrial fibrillation When compared with ECG of 05-Sep-2023 15:21, No significant change was found Confirmed by Tessa Lerner [phone]) on 10/24/2023 8:43:36 AM  Echocardiogram: 07/18/2015: LVEF 55-60%, mild LVH, normal diastolic parameters, no significant valvular heart disease.  09/24/2023:  1. Left ventricular ejection fraction, by estimation, is 55 to 60%. The left ventricle has normal function. The left ventricle has no regional wall motion abnormalities. There is mild left ventricular hypertrophy. Left ventricular diastolic parameters  are indeterminate.  2. Right ventricular systolic function is normal. The right ventricular size is normal. Tricuspid regurgitation signal is inadequate for assessing PA pressure.  3. Left atrial size was mildly dilated.  4. The mitral valve is normal in structure. Trivial mitral valve regurgitation. No evidence of mitral stenosis.  5. The aortic valve is grossly normal. Aortic valve regurgitation is not visualized. No aortic stenosis is present.  6. The inferior vena cava is normal in size with greater than 50% respiratory variability, suggesting right atrial pressure of 3 mmHg.   Stress Testing: 07/2015: The left ventricular ejection fraction is mildly decreased (45-54%). Nuclear stress EF: 51%. There was no ST segment deviation noted during stress The study is normal. This is a low risk study.  RADIOLOGY: NA  Risk Assessment/Calculations:   Click Here to Calculate/Change CHADS2VASc Score The patient's CHADS2-VASc  score is 2, indicating a 2.2% annual risk of stroke.    Labs:       Latest Ref Rng & Units 09/05/2023    5:14 PM  08/18/2023    1:41 PM 01/06/2023    9:03 AM  CBC  WBC 3.4 - 10.8 x10E3/uL  5.9  5.9   Hemoglobin 13.0 - 17.7 g/dL 16.1  09.6  04.5   Hematocrit 37.5 - 51.0 % 46.2  44.9  47.1   Platelets 150 - 450 x10E3/uL  331  316.0        Latest Ref Rng & Units 09/11/2023    3:26 PM 09/05/2023    5:17 PM 09/05/2023    5:14 PM  BMP  Glucose 70 - 99 mg/dL 86  81  80   BUN 6 - 24 mg/dL 8  13  13    Creatinine 0.76 - 1.27 mg/dL 4.09  8.11  9.14   BUN/Creat Ratio 9 - 20 9  14  15    Sodium 134 - 144 mmol/L 143  142  141   Potassium 3.5 - 5.2 mmol/L 4.9  4.8  4.7   Chloride 96 - 106 mmol/L 105  105  105   CO2 20 - 29 mmol/L 25  20  22    Calcium 8.7 - 10.2 mg/dL 9.2  9.7  9.5       Latest Ref Rng & Units 09/11/2023    3:26 PM 09/05/2023    5:17 PM 09/05/2023    5:14 PM  CMP  Glucose 70 - 99 mg/dL 86  81  80   BUN 6 - 24 mg/dL 8  13  13    Creatinine 0.76 - 1.27 mg/dL 7.82  9.56  2.13   Sodium 134 - 144 mmol/L 143  142  141   Potassium 3.5 - 5.2 mmol/L 4.9  4.8  4.7   Chloride 96 - 106 mmol/L 105  105  105   CO2 20 - 29 mmol/L 25  20  22    Calcium 8.7 - 10.2 mg/dL 9.2  9.7  9.5     Lab Results  Component Value Date   CHOL 115 08/20/2023   HDL 45 08/20/2023   LDLCALC 58 08/20/2023   TRIG 54 08/20/2023   CHOLHDL 2.6 08/20/2023   No results for input(s): "LIPOA" in the last 8760 hours. No components found for: "NTPROBNP" Recent Labs    09/05/23 1714 09/11/23 1526  PROBNP 1,234* 605*   Recent Labs    08/18/23 1341 08/27/23 0834 10/01/23 1525  TSH <0.005* <0.01* <0.01*     Physical Exam:    Today's Vitals   10/24/23 0818  BP: 120/66  Pulse: 76  Resp: 16  SpO2: 96%  Weight: 232 lb 6.4 oz (105.4 kg)  Height: 5\' 9"  (1.753 m)   Body mass index is 34.32 kg/m. Wt Readings from Last 3 Encounters:  10/24/23 232 lb 6.4 oz (105.4 kg)  10/08/23 232 lb 3.2 oz (105.3 kg)  09/05/23 228 lb 12.8 oz (103.8 kg)    Physical Exam  Constitutional: No distress.  hemodynamically stable  HENT:   Degree of exophthalmos in the left eye  Neck: No JVD present.  Cardiovascular: Normal rate, S1 normal and S2 normal. An irregularly irregular rhythm present. Exam reveals no gallop, no S3 and no S4.  No murmur heard. Pulmonary/Chest: Effort normal and breath sounds normal. No stridor. He has no wheezes. He has no rales.  Abdominal: Soft. Bowel sounds are normal. He exhibits no  distension. There is no abdominal tenderness.  Musculoskeletal:        General: No edema.     Cervical back: Neck supple.  Neurological: He is alert and oriented to person, place, and time. He has intact cranial nerves (2-12).  Skin: Skin is warm.     Impression & Recommendation(s):  Impression:   ICD-10-CM   1. New onset a-fib (HCC)  I48.91 propranolol (INNOPRAN XL) 80 MG 24 hr capsule    2. Benign hypertension  I10 EKG 12-Lead    propranolol (INNOPRAN XL) 80 MG 24 hr capsule    3. Atherosclerosis of aorta (HCC)  I70.0     4. Family history of premature CAD  Z82.49     5. Mixed hyperlipidemia  E78.2         Recommendation(s):   New onset a-fib (HCC) Newly discovered atrial fibrillation in the setting of hyperthyroidism. Persistent Rate control: Propranolol. Rhythm control: N/A. Thromboembolic prophylaxis: Eliquis Will transition him from propranolol 20 mg p.o. 3 times daily to propranolol extended release 80 mg p.o. daily Would like him to ideally be euthyroid to see if he converts to sinus rhythm without needing to undergo direct-current cardioversion. Would like to avoid direct-current cardioversion as this will predispose him to being on anticoagulation for at least 4 weeks postprocedure to reduce risk of thromboembolic events.  This could delay any procedures or interventions that he may need from thyroid standpoint. Patient plans to have an ultrasound of the thyroid and follows up with endocrinology in the coming weeks.  Benign hypertension Office blood pressures are very  well-controlled. Continue Lasix 20 mg p.o. every morning. Continue valsartan 160 mg p.o. daily  Mixed hyperlipidemia Currently on Crestor 10 mg p.o. daily.   He denies myalgia or other side effects. Most recent lipids dated January 2025, independently reviewed as noted above.  LDL is 58 mg/dL.  Cardiology is following peripherally.  Orders Placed:  Orders Placed This Encounter  Procedures   EKG 12-Lead   Final Medication List:    Meds ordered this encounter  Medications   propranolol (INNOPRAN XL) 80 MG 24 hr capsule    Sig: Take 1 capsule (80 mg total) by mouth in the morning.    Dispense:  30 capsule    Refill:  3    Medications Discontinued During This Encounter  Medication Reason   propranolol (INDERAL) 20 MG tablet Dose change      Current Outpatient Medications:    acetaminophen (TYLENOL) 500 MG tablet, Take 500 mg by mouth every 6 (six) hours as needed for mild pain., Disp: , Rfl:    apixaban (ELIQUIS) 5 MG TABS tablet, Take 1 tablet (5 mg total) by mouth 2 (two) times daily., Disp: 180 tablet, Rfl: 3   famotidine (PEPCID) 20 MG tablet, Take 1 tablet (20 mg total) by mouth daily., Disp: 180 tablet, Rfl: 3   fexofenadine (ALLEGRA) 180 MG tablet, Take 180 mg by mouth daily., Disp: , Rfl:    furosemide (LASIX) 20 MG tablet, Take 1 tablet (20 mg total) by mouth in the morning., Disp: 90 tablet, Rfl: 3   methimazole (TAPAZOLE) 10 MG tablet, Take 1 tablet (10 mg total) by mouth 3 (three) times daily., Disp: 270 tablet, Rfl: 1   propranolol (INNOPRAN XL) 80 MG 24 hr capsule, Take 1 capsule (80 mg total) by mouth in the morning., Disp: 30 capsule, Rfl: 3   rosuvastatin (CRESTOR) 10 MG tablet, TAKE 1 TABLET(10 MG) BY MOUTH DAILY, Disp: 90 tablet,  Rfl: 1   valsartan (DIOVAN) 160 MG tablet, Take 1 tablet (160 mg total) by mouth at bedtime., Disp: 90 tablet, Rfl: 3  Consent:   NA  Disposition:    55-month follow-up sooner if needed  His questions and concerns were addressed  to his satisfaction. He voices understanding of the recommendations provided during this encounter.    Signed, Tessa Lerner, DO, [facility]  [facility]  [address] [place], [place] [address] 10/25/2023 4:58 PM

## 2023-10-24 NOTE — Patient Instructions (Addendum)
 Medication Instructions:  Your physician has recommended you make the following change in your medication:   STOP Propranolol  START Propranolol ER 80 mg once daily in the morning  HOLD if systolic blood pressure is less than 100 and/ or if heart rate is less than 55  *If you need a refill on your cardiac medications before your next appointment, please call your pharmacy*  Lab Work: None ordered today. If you have labs (blood work) drawn today and your tests are completely normal, you will receive your results only by: MyChart Message (if you have MyChart) OR A paper copy in the mail If you have any lab test that is abnormal or we need to change your treatment, we will call you to review the results.  Testing/Procedures: None ordered today.  Follow-Up: At [facility], you and your health needs are our priority.  As part of our continuing mission to provide you with exceptional heart care, we have created designated Provider Care Teams.  These Care Teams include your primary Cardiologist (physician) and Advanced Practice Providers (APPs -  Physician Assistants and Nurse Practitioners) who all work together to provide you with the care you need, when you need it.  We recommend signing up for the patient portal called "MyChart".  Sign up information is provided on this After Visit Summary.  MyChart is used to connect with patients for Virtual Visits (Telemedicine).  Patients are able to view lab/test results, encounter notes, upcoming appointments, etc.  Non-urgent messages can be sent to your provider as well.   To learn more about what you can do with MyChart, go to [email or web address].    Your next appointment:   3 month(s)  The format for your next appointment:   In Person  Provider:   Tessa Lerner, DO {  Other Instructions If your thyroid issues are resolved, come in sooner than 3 months.

## 2023-10-25 ENCOUNTER — Encounter: Payer: Self-pay | Admitting: Cardiology

## 2023-10-29 ENCOUNTER — Telehealth: Payer: Self-pay | Admitting: Cardiology

## 2023-10-29 DIAGNOSIS — I4891 Unspecified atrial fibrillation: Secondary | ICD-10-CM

## 2023-10-29 DIAGNOSIS — I10 Essential (primary) hypertension: Secondary | ICD-10-CM

## 2023-10-29 MED ORDER — PROPRANOLOL HCL ER BEADS 80 MG PO CP24: 80.0000 mg | ORAL_CAPSULE | Freq: Every morning | ORAL | 3 refills | Status: DC

## 2023-10-29 NOTE — Telephone Encounter (Signed)
*  STAT* If patient is at the pharmacy, call can be transferred to refill team.   1. Which medications need to be refilled? (please list name of each medication and dose if known) propranolol (INNOPRAN XL) 80 MG 24 hr capsule    2. Would you like to learn more about the convenience, safety, & potential cost savings by using the [facility]?     3. Are you open to using the [facility] (Type [facility].   4. Which pharmacy/location (including street and city if local pharmacy) is medication to be sent to? [facility] [phone] - [place], [place] - [address] AT [facility] [address]    5. Do they need a 30 day or 90 day supply? 90

## 2023-10-29 NOTE — Telephone Encounter (Signed)
 Pt's medication was sent to pt's pharmacy as requested. Confirmation received.

## 2023-10-30 ENCOUNTER — Telehealth: Payer: Self-pay | Admitting: Pharmacy Technician

## 2023-10-30 ENCOUNTER — Other Ambulatory Visit (HOSPITAL_COMMUNITY): Payer: Self-pay

## 2023-10-30 NOTE — Telephone Encounter (Signed)
 Hey I sent pt's medication into the pharmacy, I never sent you anything on this pt's medication. Don't know what your talking about. Thanks

## 2023-10-30 NOTE — Telephone Encounter (Signed)
 Hi! We got a note that this Innopran XL 80mg  needs a prior authorization. It doesn't come as propranolol being XL. I saw the doctors note on 10/24/23 and it said: "START Propranolol ER 80 mg once daily in the morning ". I ran a test claim and propranolol er 80mg  capsules go through no problem with copay of 10.00. I did submit the prior auth for the Innopran XL, in case that is what is wanted but sending in case this can be changed to propranolol er 80mg . Thank you!!

## 2023-10-30 NOTE — Telephone Encounter (Signed)
 Pharmacy Patient Advocate Encounter   Received notification from CoverMyMeds that prior authorization for innopran xl is required/requested.   Insurance verification completed.   The patient is insured through [facility] .   Per test claim: PA required; PA submitted to above mentioned insurance via CoverMyMeds Key/confirmation #/EOC [id number] Status is pending

## 2023-10-30 NOTE — Telephone Encounter (Signed)
 I ran a test claim and propranolol er 80mg  capsules go through no problem with copay of 10.00.

## 2023-10-31 MED ORDER — PROPRANOLOL HCL ER 80 MG PO CP24: 80.0000 mg | ORAL_CAPSULE | Freq: Every day | ORAL | 3 refills | Status: DC

## 2023-10-31 NOTE — Telephone Encounter (Signed)
 Changed to propranolol er 80mg 

## 2023-10-31 NOTE — Telephone Encounter (Signed)
 I'm sorry for the confusion, I received the notification from the pharmacy originally and I have submitted the prior authorization. In my original note, I was trying to see if this could be changed to propranolol er 80mg  instead of the Innopran xl 80mg  but I had submitted the prior authorization in case the change was not appropriate. Thank you

## 2023-10-31 NOTE — Telephone Encounter (Signed)
 Hi, I sent it to you since you ordered the prescription. I can send it to triage if that would be better, I was just needing someone to see if they help with the propranolol prescription. Thank you!

## 2023-10-31 NOTE — Telephone Encounter (Signed)
 Thank you for your help but this was changed to propranolol er 80mg .

## 2023-10-31 NOTE — Telephone Encounter (Signed)
 Hey Willette Cluster, CPhT, I called [facility] in [place] and they stated that pt needs a PA in their system for pt to be able to get this medication propranolol (Innopran XL) 80 mg 24hr at their pharmacy. Please address

## 2023-10-31 NOTE — Addendum Note (Signed)
 Addended by: Malena Peer D on: 10/31/2023 11:46 AM   Modules accepted: Orders

## 2023-10-31 NOTE — Telephone Encounter (Signed)
 I will send rx for propranolol ER (inderal xl) 80mg  to pharmacy

## 2023-11-03 ENCOUNTER — Other Ambulatory Visit: Payer: Self-pay | Admitting: Medical

## 2023-11-03 DIAGNOSIS — R112 Nausea with vomiting, unspecified: Secondary | ICD-10-CM

## 2023-11-03 NOTE — Telephone Encounter (Signed)
 Aspirin and Oxybutynin was discontinued by Cardiologist

## 2023-11-05 ENCOUNTER — Telehealth: Payer: Self-pay | Admitting: Medical

## 2023-11-05 DIAGNOSIS — H02534 Eyelid retraction left upper eyelid: Secondary | ICD-10-CM | POA: Diagnosis not present

## 2023-11-05 DIAGNOSIS — E0500 Thyrotoxicosis with diffuse goiter without thyrotoxic crisis or storm: Secondary | ICD-10-CM | POA: Diagnosis not present

## 2023-11-05 DIAGNOSIS — H0279 Other degenerative disorders of eyelid and periocular area: Secondary | ICD-10-CM | POA: Diagnosis not present

## 2023-11-05 DIAGNOSIS — H0520 Unspecified exophthalmos: Secondary | ICD-10-CM | POA: Diagnosis not present

## 2023-11-05 DIAGNOSIS — H11432 Conjunctival hyperemia, left eye: Secondary | ICD-10-CM | POA: Diagnosis not present

## 2023-11-05 DIAGNOSIS — R112 Nausea with vomiting, unspecified: Secondary | ICD-10-CM

## 2023-11-05 MED ORDER — FAMOTIDINE 20 MG PO TABS: 20.0000 mg | ORAL_TABLET | Freq: Every day | ORAL | 5 refills | Status: DC

## 2023-11-05 NOTE — Addendum Note (Signed)
 Addended by: Herminio Commons A on: 11/05/2023 02:50 PM   Modules accepted: Orders

## 2023-11-05 NOTE — Telephone Encounter (Signed)
 Copied from CRM [id number]. Topic: Clinical - Medication Question >> Nov 05, 2023  1:37 PM Yolanda T wrote: Reason for CRM: patient is trying to get all his medication on the same refill schedule and need only a 30 day supply for famotidine (PEPCID) 20 MG tablet. Pharmacy stated he needed a 30 day script for 30 days. Please f/u with patient

## 2023-11-05 NOTE — Telephone Encounter (Signed)
 Refilled for 30 days

## 2023-11-10 ENCOUNTER — Other Ambulatory Visit: Payer: Self-pay | Admitting: Medical

## 2023-11-18 ENCOUNTER — Other Ambulatory Visit: Payer: Self-pay

## 2023-12-02 ENCOUNTER — Other Ambulatory Visit: Payer: Self-pay

## 2023-12-03 ENCOUNTER — Telehealth: Payer: Self-pay | Admitting: Cardiology

## 2023-12-03 NOTE — Telephone Encounter (Signed)
 Spoke with patient and he states he feel since starting propranolol  he has gained a 10 lbs, he can not sleep at night and he has numbness and tingling in his right arm sometimes all the way to his finger tips at times

## 2023-12-03 NOTE — Telephone Encounter (Signed)
 Pt c/o medication issue:  1. Name of Medication:  apixaban  (ELIQUIS ) 5 MG TABS tablet  propranolol  ER (INDERAL  LA) 80 MG 24 hr capsule   2. How are you currently taking this medication (dosage and times per day)? Yes  3. Are you having a reaction (difficulty breathing--STAT)? No  4. What is your medication issue? Pt states he can't sleep and he's gaining weight and some finger numbness. Please advise

## 2023-12-04 ENCOUNTER — Ambulatory Visit: Payer: Self-pay

## 2023-12-04 MED ORDER — DILTIAZEM HCL ER COATED BEADS 180 MG PO CP24: 180.0000 mg | ORAL_CAPSULE | Freq: Every day | ORAL | 0 refills | Status: DC

## 2023-12-04 NOTE — Telephone Encounter (Signed)
 Spoke with pt and explained the medication change we are making. Pt verbalized understanding of plan and had no further questions at this time.

## 2023-12-04 NOTE — Telephone Encounter (Signed)
 D/C Propranolol . Start Diltiazem  180 mg po qday, 30 tabs to see how he does. We may need to change the dose based on his symptoms and HR response.   Shawnn Bouillon Monroe, DO, [facility]

## 2023-12-04 NOTE — Telephone Encounter (Signed)
 Propranolol  less likely the cause of weight gain or numbness/tingling. Refer to PCP. But it can cause sleep related issues like insomnia or sleep disturbance. In fact, mostly all beta blocker could cause that it all depends how much its effecting him to consider a change in treatment. Alternative would be CCB. Propanolol was choose as it will help is HR and hyperthyroidism.   Soua Caltagirone Grand Pass, DO, [facility]

## 2023-12-04 NOTE — Telephone Encounter (Addendum)
 Copied from CRM [id number]. Topic: Clinical - Medical Advice >> Dec 04, 2023  3:10 PM Rosaria Common wrote: Reason for CRM: Right arm is numb for the past month. Concerned about pinched nerve and getting worse. Pain in arm/neck area. Fingertips numb also.   Chief Complaint: pain and numbness from neck down right arm to fingertips Symptoms: numbness and 8/10 pain shooting from neck down RUE to fingertips, interrupted sleep, weakening grip Frequency: continual, intermittent Pertinent Negatives: Patient denies chest pain, headache, dizziness, vision changes, changes to speech, unsteady on feet, other weakness, bowel/bladder changes, heart palpitations Disposition: [] 911 / [] ED /[x] Urgent Care (no appt availability in office) / [] Appointment(In office/virtual)/ []  [facility]/ [] Home Care/ [] Refused Recommended Disposition /[] [facility]/ []  Follow-up with PCP Additional Notes: Pt reporting that he has been experiencing sharp pain and numbness that started a month ago from neck to shoulder, with pain when turning head a certain way, originally thought pinched nerve, but been progressively worsening, now pain and numbness goes from neck to shoulder, down right arm and into fingertips. Pt reporting that his symptoms have gotten progressively worse and especially worse in the past couple days, getting minimal sleep, and pain is now 8/10, and feels his "grip getting weaker in hand." Pt reporting hx of 2 neck surgeries, first surgery with "vertebrae got crushed, bone chipped off and cut off blood flow to right arm," needed redone within 5 years to next couple vertebrae down, and states his similar are "kinda similar," been "constantly shaking my arm because felt like asleep all the time" like he did then. Advised pt be examined in next 4 hours, advised UC or ED, advised pt call Dr. Adonis Alamin his surgeon today as well to follow up, advised call back if worsening. Pt verbalized understanding and intent to  follow through. Please advise with further recommendations as appropriate.  Reason for Disposition  [1] Weakness of the face, arm / hand, or leg / foot on one side of the body AND [2] gradual onset (e.g., days to weeks) AND [3] present now  Answer Assessment - Initial Assessment Questions 1. SYMPTOM: "What is the main symptom you are concerned about?" (e.g., weakness, numbness)     Numbness especially thumb and pointer finger, rest of fingers get tingly at fingertips 2. ONSET: "When did this start?" (minutes, hours, days; while sleeping)     A month ago 4. PATTERN "Does this come and go, or has it been constant since it started?"  "Is it present now?"     Comes and goes 5. CARDIAC SYMPTOMS: "Have you had any of the following symptoms: chest pain, difficulty breathing, palpitations?"     Always SOB with pollen and dust, cardio saying thyroid  is making his heart go out of rhythm into a-fib currently, wants to do cardioversion not right now to wait on thyroid  doctor to see if can get thyroid  under control 6. NEUROLOGIC SYMPTOMS: "Have you had any of the following symptoms: headache, dizziness, vision loss, double vision, changes in speech, unsteady on your feet?"     No  Thought pinched nerve because turn head certain way, sharp pain in neck to shoulder, now gotten to where going all the way down right arm to fingertips, feels grip getting weaker in hand Pain comes and goes, when it comes 8/10 Not been able to sleep, 4 hours past 2 days Worsened in past couple of days especially  Called my heart doc and changed my med Had 2 neck surgeries, vertebrae  got crushed, bone chipped off and cut off blood flow to right arm, within 5 years going to need to come in there again, did next 2 vertebrae, seems kinda similar, constantly shaking my arm because felt like asleep all the time like did then  Protocols used: Neurologic Deficit-A-AH

## 2023-12-04 NOTE — Addendum Note (Signed)
 Addended by: Roxianne Coral on: 12/04/2023 03:48 PM   Modules accepted: Orders

## 2023-12-04 NOTE — Telephone Encounter (Signed)
 Spoke with Scott Moss over the phone and explained Dr. Jesus Morones recommendations. Scott Moss stated he would like to switch to the alternative CCB d/t having such severe sleep issues. Will forward to Dr. Albert Huff to further advise.

## 2023-12-08 ENCOUNTER — Other Ambulatory Visit

## 2023-12-08 DIAGNOSIS — E0590 Thyrotoxicosis, unspecified without thyrotoxic crisis or storm: Secondary | ICD-10-CM | POA: Diagnosis not present

## 2023-12-08 LAB — TSH: TSH: 62.39 m[IU]/L — ABNORMAL HIGH (ref 0.40–4.50)

## 2023-12-08 LAB — T4, FREE: Free T4: 0.2 ng/dL — ABNORMAL LOW (ref 0.8–1.8)

## 2023-12-08 LAB — T3, FREE: T3, Free: 1 pg/mL — ABNORMAL LOW (ref 2.3–4.2)

## 2023-12-10 ENCOUNTER — Encounter: Payer: Self-pay | Admitting: "Endocrinology

## 2023-12-12 ENCOUNTER — Encounter: Payer: Self-pay | Admitting: "Endocrinology

## 2023-12-12 ENCOUNTER — Ambulatory Visit: Admitting: "Endocrinology

## 2023-12-12 VITALS — BP 100/80 | HR 77 | Ht 69.0 in | Wt 253.0 lb

## 2023-12-12 DIAGNOSIS — H0520 Unspecified exophthalmos: Secondary | ICD-10-CM

## 2023-12-12 DIAGNOSIS — E0590 Thyrotoxicosis, unspecified without thyrotoxic crisis or storm: Secondary | ICD-10-CM

## 2023-12-12 NOTE — Progress Notes (Signed)
 Outpatient Endocrinology Note Scott Newcomer, MD  12/12/23   Scott Moss May 08, 1976 [id number]  Referring Provider: Claudene Crystal, PA-C Primary Care Provider: Claudene Crystal, PA-C Subjective  No chief complaint on file.   Assessment & Plan  Diagnoses and all orders for this visit:  Hyperthyroidism -     TSH -     T3, free -     T4, free   Scott Moss hass stopped methimazole  10 mg tid on 12/10/23 when TSH shifted from <0.01 to 62.39. Patient is currently clinically hypothyroid.  Discussed the etiology for hyperthyroidism. Educated on thyroid  axis.  Recommend the following: Continue to hold methimazole  10 mg thrice a day. Will adjust dose based on labs in 1 week. Educated on symptoms of hyperthyroidism (pt originally had many of them, if recurred pt will start half a pill of MMI a day). Repeat labs sooner if symptoms of hyper or hypothyroidism develop.  -complications of untreated hyperthyroidism include atrial fibrillation, heart failure and osteoporosis -side effects of Methimazole  include but not limited to allergic reaction, rash, bone marrow suppression, liver dysfunction and teratogenic potential -implications in pregnancy and breastfeeding -compliance and follow up needs    Left eye proptosis noted since 06/2023 Saw ophthalmologist, plan for tepezza   History of testicular cancer Patient has dysphagia Ordered baseline thyroid  U/S 11/13/23 reviewed thyroid  images and report: no nodules reported   I have reviewed current medications, nurse's notes, allergies, vital signs, past medical and surgical history, family medical history, and social history for this encounter. Counseled patient on symptoms, examination findings, lab findings, imaging results, treatment decisions and monitoring and prognosis. The patient understood the recommendations and agrees with the treatment plan. All questions regarding treatment plan were fully answered.   Return in  about 11 days (around 12/23/2023) for visit + labs before next visit.   Scott Newcomer, MD  12/12/23   I have reviewed current medications, nurse's notes, allergies, vital signs, past medical and surgical history, family medical history, and social history for this encounter. Counseled patient on symptoms, examination findings, lab findings, imaging results, treatment decisions and monitoring and prognosis. The patient understood the recommendations and agrees with the treatment plan. All questions regarding treatment plan were fully answered.   History of Present Illness Scott Moss is a 48 y.o. year old male who presents to our clinic with hyperthyroidism diagnosed in 2025.    Left eye became bulgy around 06/2023   Symptoms suggestive of HYPOTHYROIDISM:               fatigue Yes              weight gain Yes              cold intolerance  Yes              constipation  Yes  Symptoms suggestive of HYPERTHYROIDISM:  weight loss  No, resolved  heat intolerance No, resolved  hyperdefecation  No, resolved  palpitations  No, resolved   Compressive symptoms:  dysphagia  No, resolved  dysphonia  No positional dyspnea (especially with simultaneous arms elevation)  No  Smokes  No On biotin  No Personal history of head/neck surgery/irradiation  Yes, spinal surgery  Adverse Drug Effects from Methimazole  (MMI): rash No fever No throat pain No arthritis No mouth ulcers No jaundice No loss of appetite No lymphadenopathy No  Grave's Ophthalmopathy Clinical Activity Score: 0/9, but evident Left eye proptosis, saw ophthalmology  Physical Exam  BP 100/80   Pulse 77   Ht 5\' 9"  (1.753 m)   Wt 253 lb (114.8 kg)   SpO2 98%   BMI 37.36 kg/m  Constitutional: well developed, well nourished Head: normocephalic, atraumatic, + L eye exophthalmos Eyes: sclera anicteric, no redness Neck: no thyromegaly, no thyroid  tenderness; no nodules palpated Lungs: normal respiratory  effort Neurology: alert and oriented, + fine hand tremor Skin: dry, no appreciable rashes Musculoskeletal: no appreciable defects Psychiatric: normal mood and affect  Allergies Allergies  Allergen Reactions   Benicar  [Olmesartan ]     Rash, allergic reaction    Current Medications Patient's Medications  New Prescriptions   No medications on file  Previous Medications   ACETAMINOPHEN  (TYLENOL ) 500 MG TABLET    Take 500 mg by mouth every 6 (six) hours as needed for mild pain.   APIXABAN  (ELIQUIS ) 5 MG TABS TABLET    Take 1 tablet (5 mg total) by mouth 2 (two) times daily.   DILTIAZEM  (CARDIZEM  CD) 180 MG 24 HR CAPSULE    Take 1 capsule (180 mg total) by mouth daily.   FAMOTIDINE  (PEPCID ) 20 MG TABLET    Take 1 tablet (20 mg total) by mouth daily.   FEXOFENADINE (ALLEGRA) 180 MG TABLET    Take 180 mg by mouth daily.   FUROSEMIDE  (LASIX ) 20 MG TABLET    Take 1 tablet (20 mg total) by mouth in the morning.   METHIMAZOLE  (TAPAZOLE ) 10 MG TABLET    Take 1 tablet (10 mg total) by mouth 3 (three) times daily.   ROSUVASTATIN  (CRESTOR ) 10 MG TABLET    TAKE 1 TABLET(10 MG) BY MOUTH DAILY   VALSARTAN  (DIOVAN ) 160 MG TABLET    Take 1 tablet (160 mg total) by mouth at bedtime.  Modified Medications   No medications on file  Discontinued Medications   No medications on file    Past Medical History Past Medical History:  Diagnosis Date   Allergy    Arthritis    Atypical chest pain 07/06/2015   Non-obstructive CAD on [facility] 04/2010.   Chondromalacia of left knee 09/2012   Complication of anesthesia    Family history of cancer    multiple family members, multiple types.   Sees Dr. Maria Moss   Family history of cancer    Family history of premature CAD    GERD (gastroesophageal reflux disease)    H/O cardiovascular stress test 07/14/2015   myocardial perfusion scan, normal, EF 45-54% mildy reduced LV function, Dr. Theodis Fiscal   H/O echocardiogram 07/18/2015   LV mild hypertrophy, 55-60%EF, Dr.  Theodis Fiscal   High cholesterol    Hypertension    Obesity    Overweight    PONV (postoperative nausea and vomiting)    Testicular cancer (HCC) 02/2005   Dr. Maria Moss    Past Surgical History Past Surgical History:  Procedure Laterality Date   ANTERIOR CERVICAL DECOMP/DISCECTOMY FUSION  11/11/2008   C5-6   APPENDECTOMY     CARDIAC CATHETERIZATION  04/13/2010   no disease   CERVICAL SPINE SURGERY  08/05/2013   Dr. Jaycee Metro   COLONOSCOPY     prior, no problems per patient, year unknown, possibly in early 30s.   HIP ARTHROPLASTY Left 03/2020   HIP ARTHROPLASTY Right 04/2020   KNEE ARTHROSCOPY Right    KNEE ARTHROSCOPY Left 10/19/2015   Procedure: LEFT KNEE SCOPE CHONDROPLASTY;  Surgeon: Ferd Householder, MD;  Location: [facility];  Service: Orthopedics;  Laterality: Left;  KNEE ARTHROSCOPY WITH LATERAL RELEASE Left 09/18/2012   Procedure: KNEE ARTHROSCOPY WITH LATERAL RELEASE;  Surgeon: Ferd Householder, MD;  Location: [facility];  Service: Orthopedics;  Laterality: Left;  WITH DEBRIDEMENT AND SHAVING(CHONDROPLASTY), Excision Medial [place]   NASAL SINUS SURGERY  08/05/2012   PARTIAL KNEE ARTHROPLASTY Left 08/15/2016   Procedure: PATELLA FEMORAL REPLACEMENT LEFT KNEE;  Surgeon: Ferd Householder, MD;  Location: [facility];  Service: Orthopedics;  Laterality: Left;   RADICAL ORCHIECTOMY  02/07/2005   left; with left testicular implant    Family History family history includes Cancer in his father and mother; Cancer (age of onset: 75) in his sister; Colon cancer in his maternal uncle; Diabetes in his maternal uncle; Esophageal cancer in his maternal uncle; Heart attack in his maternal uncle and paternal uncle; Heart attack (age of onset: 37) in his father; Heart disease in his father; Hypertension in his father, mother, and sister; Lung cancer in his father; Migraines in his mother and sister; Ovarian cancer in his mother; Skin cancer in his  father; Stroke in his mother.  Social History Social History   Socioeconomic History   Marital status: Married    Spouse name: Not on file   Number of children: 2   Years of education: Not on file   Highest education level: Not on file  Occupational History   Occupation: truck driver  Tobacco Use   Smoking status: Never    Passive exposure: Never   Smokeless tobacco: Never   Tobacco comments:    never used tobacco  Vaping Use   Vaping status: Never Used  Substance and Sexual Activity   Alcohol use: Yes    Alcohol/week: 3.0 standard drinks of alcohol    Types: 3 Cans of beer per week    Comment: social   Drug use: No   Sexual activity: Yes  Other Topics Concern   Not on file  Social History Narrative   Married, 2 children, is a Naval architect for [employer].   08/2022      Social Drivers of Health   Financial Resource Strain: Not on file  Food Insecurity: Not on file  Transportation Needs: Not on file  Physical Activity: Not on file  Stress: Not on file  Social Connections: Not on file  Intimate Partner Violence: Not on file    Laboratory Investigations Lab Results  Component Value Date   TSH 62.39 (H) 12/08/2023   TSH <0.01 (L) 10/01/2023   TSH <0.01 (L) 08/27/2023   FREET4 0.2 (L) 12/08/2023   FREET4 0.8 10/01/2023   FREET4 2.1 (H) 08/27/2023     Lab Results  Component Value Date   TSI 282 (H) 08/27/2023     No components found for: "TRAB"   Lab Results  Component Value Date   CHOL 115 08/20/2023   Lab Results  Component Value Date   HDL 45 08/20/2023   Lab Results  Component Value Date   LDLCALC 58 08/20/2023   Lab Results  Component Value Date   TRIG 54 08/20/2023   Lab Results  Component Value Date   CHOLHDL 2.6 08/20/2023   Lab Results  Component Value Date   CREATININE 0.89 09/11/2023   Lab Results  Component Value Date   GFR 94.61 01/06/2023      Component Value Date/Time   NA 143 09/11/2023 1526   K 4.9 09/11/2023 1526    CL 105 09/11/2023 1526   CO2 25 09/11/2023 1526  GLUCOSE 86 09/11/2023 1526   GLUCOSE 96 01/06/2023 0903   BUN 8 09/11/2023 1526   CREATININE 0.89 09/11/2023 1526   CREATININE 1.02 02/06/2017 0659   CALCIUM  9.2 09/11/2023 1526   PROT 6.8 08/20/2023 1107   ALBUMIN 4.1 08/20/2023 1107   AST 22 08/20/2023 1107   ALT 29 08/20/2023 1107   ALKPHOS 135 (H) 08/20/2023 1107   BILITOT 0.8 08/20/2023 1107   GFRNONAA >60 12/17/2022 1855   GFRAA 106 12/16/2019 1106      Latest Ref Rng & Units 09/11/2023    3:26 PM 09/05/2023    5:17 PM 09/05/2023    5:14 PM  BMP  Glucose 70 - 99 mg/dL 86  81  80   BUN 6 - 24 mg/dL 8  13  13    Creatinine 0.76 - 1.27 mg/dL 4.09  8.11  9.14   BUN/Creat Ratio 9 - 20 9  14  15    Sodium 134 - 144 mmol/L 143  142  141   Potassium 3.5 - 5.2 mmol/L 4.9  4.8  4.7   Chloride 96 - 106 mmol/L 105  105  105   CO2 20 - 29 mmol/L 25  20  22    Calcium  8.7 - 10.2 mg/dL 9.2  9.7  9.5        Component Value Date/Time   WBC 5.9 08/18/2023 1341   WBC 5.9 01/06/2023 0903   RBC 5.22 08/18/2023 1341   RBC 5.21 01/06/2023 0903   HGB 15.5 09/05/2023 1714   HGB 16.4 06/20/2014 1026   HGB 16.4 06/04/2007 0755   HCT 46.2 09/05/2023 1714   HCT 47.1 06/20/2014 1026   HCT 46.3 06/04/2007 0755   PLT 331 08/18/2023 1341   MCV 86 08/18/2023 1341   MCV 87 06/20/2014 1026   MCV 88.2 06/04/2007 0755   MCH 28.7 08/18/2023 1341   MCH 30.3 12/17/2022 1855   MCHC 33.4 08/18/2023 1341   MCHC 32.9 01/06/2023 0903   RDW 12.2 08/18/2023 1341   RDW 11.9 06/20/2014 1026   RDW 13.0 06/04/2007 0755   LYMPHSABS 1.7 08/18/2023 1341   LYMPHSABS 2.1 06/20/2014 1026   LYMPHSABS 1.4 06/04/2007 0755   MONOABS 0.6 01/06/2023 0903   MONOABS 0.4 06/04/2007 0755   EOSABS 0.1 08/18/2023 1341   EOSABS 0.2 06/20/2014 1026   BASOSABS 0.0 08/18/2023 1341   BASOSABS 0.1 06/20/2014 1026   BASOSABS 0.0 06/04/2007 0755      Parts of this note may have been dictated using voice recognition software.  There may be variances in spelling and vocabulary which are unintentional. Not all errors are proofread. Please notify the [facility] if any discrepancies are noted or if the meaning of any statement is not clear.

## 2023-12-18 ENCOUNTER — Other Ambulatory Visit

## 2023-12-18 DIAGNOSIS — M5412 Radiculopathy, cervical region: Secondary | ICD-10-CM | POA: Diagnosis not present

## 2023-12-18 DIAGNOSIS — E0590 Thyrotoxicosis, unspecified without thyrotoxic crisis or storm: Secondary | ICD-10-CM | POA: Diagnosis not present

## 2023-12-19 ENCOUNTER — Encounter: Payer: Self-pay | Admitting: "Endocrinology

## 2023-12-19 ENCOUNTER — Other Ambulatory Visit: Payer: Self-pay | Admitting: Neurosurgery

## 2023-12-19 ENCOUNTER — Telehealth (INDEPENDENT_AMBULATORY_CARE_PROVIDER_SITE_OTHER): Admitting: "Endocrinology

## 2023-12-19 DIAGNOSIS — H0520 Unspecified exophthalmos: Secondary | ICD-10-CM | POA: Diagnosis not present

## 2023-12-19 DIAGNOSIS — M5412 Radiculopathy, cervical region: Secondary | ICD-10-CM

## 2023-12-19 DIAGNOSIS — E0590 Thyrotoxicosis, unspecified without thyrotoxic crisis or storm: Secondary | ICD-10-CM

## 2023-12-19 LAB — TSH: TSH: 4.25 m[IU]/L (ref 0.40–4.50)

## 2023-12-19 LAB — T3, FREE: T3, Free: 4.8 pg/mL — ABNORMAL HIGH (ref 2.3–4.2)

## 2023-12-19 LAB — T4, FREE: Free T4: 1 ng/dL (ref 0.8–1.8)

## 2023-12-19 MED ORDER — METHIMAZOLE 5 MG PO TABS
2.5000 mg | ORAL_TABLET | Freq: Every day | ORAL | 1 refills | Status: DC
Start: 2023-12-19 — End: 2024-06-07

## 2023-12-19 NOTE — Progress Notes (Signed)
 The patient reports they are currently: [name]. I spent 6-7 minutes on the VIDEO with the patient on the date of service. I spent an additional up to 5 minutes on pre- and post-visit activities on the date of service.   The patient was physically located in [place]  or a state in which I am permitted to provide care. The patient and/or parent/guardian understood that s/he may incur co-pays and cost sharing, and agreed to the telemedicine visit. The visit was reasonable and appropriate under the circumstances given the patient's presentation at the time.  The patient and/or parent/guardian has been advised of the potential risks and limitations of this mode of treatment (including, but not limited to, the absence of in-person examination) and has agreed to be treated using telemedicine. The patient's/patient's family's questions regarding telemedicine have been answered.   The patient and/or parent/guardian has also been advised to contact their provider's office for worsening conditions, and seek emergency medical treatment and/or call 911 if the patient deems either necessary.     Outpatient Endocrinology Note Scott Newcomer, MD  12/19/23   Scott Moss 06-12-1976 [id number]  Referring Provider: Claudene Crystal, PA-C Primary Care Provider: Claudene Crystal, PA-C Subjective  No chief complaint on file.   Assessment & Plan  There are no diagnoses linked to this encounter.  Scott Moss has stopped methimazole  10 mg tid on 12/10/23 when TSH shifted from <0.01 to 62.39. Patient is currently biochemically hyperthyroid but clinically still hypothyroid.  Discussed the etiology for hyperthyroidism. Educated on thyroid  axis.  12/19/23 Recommend the following: start methimazole  2.5 mg once a day.  Repeat labs sooner if symptoms of hyper or hypothyroidism develop.  -complications of untreated hyperthyroidism include atrial fibrillation, heart failure and osteoporosis -side effects of  Methimazole  include but not limited to allergic reaction, rash, bone marrow suppression, liver dysfunction and teratogenic potential -implications in pregnancy and breastfeeding -compliance and follow up needs    Left eye proptosis noted since 06/2023 Saw ophthalmologist, plan for tepezza   History of testicular cancer Patient has dysphagia 11/13/23 reviewed thyroid  images and report: no nodules reported   I have reviewed current medications, nurse's notes, allergies, vital signs, past medical and surgical history, family medical history, and social history for this encounter. Counseled patient on symptoms, examination findings, lab findings, imaging results, treatment decisions and monitoring and prognosis. The patient understood the recommendations and agrees with the treatment plan. All questions regarding treatment plan were fully answered.   No follow-ups on file.  Scott Newcomer, MD  12/19/23   I have reviewed current medications, nurse's notes, allergies, vital signs, past medical and surgical history, family medical history, and social history for this encounter. Counseled patient on symptoms, examination findings, lab findings, imaging results, treatment decisions and monitoring and prognosis. The patient understood the recommendations and agrees with the treatment plan. All questions regarding treatment plan were fully answered.   History of Present Illness Scott Moss is a 48 y.o. year old male who presents to our clinic with hyperthyroidism diagnosed in 2025.    Left eye became bulgy around 06/2023   Symptoms suggestive of HYPOTHYROIDISM:               fatigue Yes              weight gain Yes              cold intolerance  Yes, varies between hot and cold  constipation  Yes  Symptoms suggestive of HYPERTHYROIDISM:  weight loss  No, resolved  heat intolerance Yes, varies between hot and cold  hyperdefecation  No, resolved  palpitations  No, resolved    Compressive symptoms:  dysphagia  No, resolved  dysphonia  No positional dyspnea (especially with simultaneous arms elevation)  No  Smokes  No On biotin  No Personal history of head/neck surgery/irradiation  Yes, spinal surgery  Adverse Drug Effects from Methimazole  (MMI): rash No fever No throat pain No arthritis No mouth ulcers No jaundice No loss of appetite No lymphadenopathy No  Grave's Ophthalmopathy Clinical Activity Score: 0/9, but evident Left eye proptosis, saw ophthalmology   Physical Exam  There were no vitals taken for this visit. Constitutional: well developed, well nourished Head: normocephalic, atraumatic, + L eye exophthalmos Eyes: sclera anicteric, no redness Neck: no thyromegaly, no thyroid  tenderness; no nodules palpated Lungs: normal respiratory effort Neurology: alert and oriented, + fine hand tremor Skin: dry, no appreciable rashes Musculoskeletal: no appreciable defects Psychiatric: normal mood and affect  Allergies Allergies  Allergen Reactions   Benicar  [Olmesartan ]     Rash, allergic reaction    Current Medications Patient's Medications  New Prescriptions   No medications on file  Previous Medications   ACETAMINOPHEN  (TYLENOL ) 500 MG TABLET    Take 500 mg by mouth every 6 (six) hours as needed for mild pain.   APIXABAN  (ELIQUIS ) 5 MG TABS TABLET    Take 1 tablet (5 mg total) by mouth 2 (two) times daily.   DILTIAZEM  (CARDIZEM  CD) 180 MG 24 HR CAPSULE    Take 1 capsule (180 mg total) by mouth daily.   FAMOTIDINE  (PEPCID ) 20 MG TABLET    Take 1 tablet (20 mg total) by mouth daily.   FEXOFENADINE (ALLEGRA) 180 MG TABLET    Take 180 mg by mouth daily.   FUROSEMIDE  (LASIX ) 20 MG TABLET    Take 1 tablet (20 mg total) by mouth in the morning.   METHIMAZOLE  (TAPAZOLE ) 10 MG TABLET    Take 1 tablet (10 mg total) by mouth 3 (three) times daily.   ROSUVASTATIN  (CRESTOR ) 10 MG TABLET    TAKE 1 TABLET(10 MG) BY MOUTH DAILY   VALSARTAN  (DIOVAN )  160 MG TABLET    Take 1 tablet (160 mg total) by mouth at bedtime.  Modified Medications   No medications on file  Discontinued Medications   No medications on file    Past Medical History Past Medical History:  Diagnosis Date   Allergy    Arthritis    Atypical chest pain 07/06/2015   Non-obstructive CAD on [facility] 04/2010.   Chondromalacia of left knee 09/2012   Complication of anesthesia    Family history of cancer    multiple family members, multiple types.   Sees Dr. Maria Shiner   Family history of cancer    Family history of premature CAD    GERD (gastroesophageal reflux disease)    H/O cardiovascular stress test 07/14/2015   myocardial perfusion scan, normal, EF 45-54% mildy reduced LV function, Dr. Theodis Fiscal   H/O echocardiogram 07/18/2015   LV mild hypertrophy, 55-60%EF, Dr. Theodis Fiscal   High cholesterol    Hypertension    Obesity    Overweight    PONV (postoperative nausea and vomiting)    Testicular cancer (HCC) 02/2005   Dr. Maria Shiner    Past Surgical History Past Surgical History:  Procedure Laterality Date   ANTERIOR CERVICAL DECOMP/DISCECTOMY FUSION  11/11/2008   C5-6  APPENDECTOMY     CARDIAC CATHETERIZATION  04/13/2010   no disease   CERVICAL SPINE SURGERY  08/05/2013   Dr. Jaycee Metro   COLONOSCOPY     prior, no problems per patient, year unknown, possibly in early 30s.   HIP ARTHROPLASTY Left 03/2020   HIP ARTHROPLASTY Right 04/2020   KNEE ARTHROSCOPY Right    KNEE ARTHROSCOPY Left 10/19/2015   Procedure: LEFT KNEE SCOPE CHONDROPLASTY;  Surgeon: Ferd Householder, MD;  Location: [facility];  Service: Orthopedics;  Laterality: Left;   KNEE ARTHROSCOPY WITH LATERAL RELEASE Left 09/18/2012   Procedure: KNEE ARTHROSCOPY WITH LATERAL RELEASE;  Surgeon: Ferd Householder, MD;  Location: [facility];  Service: Orthopedics;  Laterality: Left;  WITH DEBRIDEMENT AND SHAVING(CHONDROPLASTY), Excision Medial [place]   NASAL SINUS SURGERY  08/05/2012    PARTIAL KNEE ARTHROPLASTY Left 08/15/2016   Procedure: PATELLA FEMORAL REPLACEMENT LEFT KNEE;  Surgeon: Ferd Householder, MD;  Location: [facility];  Service: Orthopedics;  Laterality: Left;   RADICAL ORCHIECTOMY  02/07/2005   left; with left testicular implant    Family History family history includes Cancer in his father and mother; Cancer (age of onset: 47) in his sister; Colon cancer in his maternal uncle; Diabetes in his maternal uncle; Esophageal cancer in his maternal uncle; Heart attack in his maternal uncle and paternal uncle; Heart attack (age of onset: 71) in his father; Heart disease in his father; Hypertension in his father, mother, and sister; Lung cancer in his father; Migraines in his mother and sister; Ovarian cancer in his mother; Skin cancer in his father; Stroke in his mother.  Social History Social History   Socioeconomic History   Marital status: Married    Spouse name: Not on file   Number of children: 2   Years of education: Not on file   Highest education level: Not on file  Occupational History   Occupation: truck driver  Tobacco Use   Smoking status: Never    Passive exposure: Never   Smokeless tobacco: Never   Tobacco comments:    never used tobacco  Vaping Use   Vaping status: Never Used  Substance and Sexual Activity   Alcohol use: Yes    Alcohol/week: 3.0 standard drinks of alcohol    Types: 3 Cans of beer per week    Comment: social   Drug use: No   Sexual activity: Yes  Other Topics Concern   Not on file  Social History Narrative   Married, 2 children, is a Naval architect for [employer].   08/2022      Social Drivers of Health   Financial Resource Strain: Not on file  Food Insecurity: Not on file  Transportation Needs: Not on file  Physical Activity: Not on file  Stress: Not on file  Social Connections: Not on file  Intimate Partner Violence: Not on file    Laboratory Investigations Lab Results  Component Value Date    TSH 4.25 12/18/2023   TSH 62.39 (H) 12/08/2023   TSH <0.01 (L) 10/01/2023   FREET4 1.0 12/18/2023   FREET4 0.2 (L) 12/08/2023   FREET4 0.8 10/01/2023     Lab Results  Component Value Date   TSI 282 (H) 08/27/2023     No components found for: "TRAB"   Lab Results  Component Value Date   CHOL 115 08/20/2023   Lab Results  Component Value Date   HDL 45 08/20/2023   Lab Results  Component  Value Date   LDLCALC 58 08/20/2023   Lab Results  Component Value Date   TRIG 54 08/20/2023   Lab Results  Component Value Date   CHOLHDL 2.6 08/20/2023   Lab Results  Component Value Date   CREATININE 0.89 09/11/2023   Lab Results  Component Value Date   GFR 94.61 01/06/2023      Component Value Date/Time   NA 143 09/11/2023 1526   K 4.9 09/11/2023 1526   CL 105 09/11/2023 1526   CO2 25 09/11/2023 1526   GLUCOSE 86 09/11/2023 1526   GLUCOSE 96 01/06/2023 0903   BUN 8 09/11/2023 1526   CREATININE 0.89 09/11/2023 1526   CREATININE 1.02 02/06/2017 0659   CALCIUM  9.2 09/11/2023 1526   PROT 6.8 08/20/2023 1107   ALBUMIN 4.1 08/20/2023 1107   AST 22 08/20/2023 1107   ALT 29 08/20/2023 1107   ALKPHOS 135 (H) 08/20/2023 1107   BILITOT 0.8 08/20/2023 1107   GFRNONAA >60 12/17/2022 1855   GFRAA 106 12/16/2019 1106      Latest Ref Rng & Units 09/11/2023    3:26 PM 09/05/2023    5:17 PM 09/05/2023    5:14 PM  BMP  Glucose 70 - 99 mg/dL 86  81  80   BUN 6 - 24 mg/dL 8  13  13    Creatinine 0.76 - 1.27 mg/dL 1.61  0.96  0.45   BUN/Creat Ratio 9 - 20 9  14  15    Sodium 134 - 144 mmol/L 143  142  141   Potassium 3.5 - 5.2 mmol/L 4.9  4.8  4.7   Chloride 96 - 106 mmol/L 105  105  105   CO2 20 - 29 mmol/L 25  20  22    Calcium  8.7 - 10.2 mg/dL 9.2  9.7  9.5        Component Value Date/Time   WBC 5.9 08/18/2023 1341   WBC 5.9 01/06/2023 0903   RBC 5.22 08/18/2023 1341   RBC 5.21 01/06/2023 0903   HGB 15.5 09/05/2023 1714   HGB 16.4 06/20/2014 1026   HGB 16.4 06/04/2007  0755   HCT 46.2 09/05/2023 1714   HCT 47.1 06/20/2014 1026   HCT 46.3 06/04/2007 0755   PLT 331 08/18/2023 1341   MCV 86 08/18/2023 1341   MCV 87 06/20/2014 1026   MCV 88.2 06/04/2007 0755   MCH 28.7 08/18/2023 1341   MCH 30.3 12/17/2022 1855   MCHC 33.4 08/18/2023 1341   MCHC 32.9 01/06/2023 0903   RDW 12.2 08/18/2023 1341   RDW 11.9 06/20/2014 1026   RDW 13.0 06/04/2007 0755   LYMPHSABS 1.7 08/18/2023 1341   LYMPHSABS 2.1 06/20/2014 1026   LYMPHSABS 1.4 06/04/2007 0755   MONOABS 0.6 01/06/2023 0903   MONOABS 0.4 06/04/2007 0755   EOSABS 0.1 08/18/2023 1341   EOSABS 0.2 06/20/2014 1026   BASOSABS 0.0 08/18/2023 1341   BASOSABS 0.1 06/20/2014 1026   BASOSABS 0.0 06/04/2007 0755      Parts of this note may have been dictated using voice recognition software. There may be variances in spelling and vocabulary which are unintentional. Not all errors are proofread. Please notify the [facility] if any discrepancies are noted or if the meaning of any statement is not clear.

## 2023-12-22 ENCOUNTER — Telehealth: Admitting: "Endocrinology

## 2023-12-24 ENCOUNTER — Encounter: Payer: Self-pay | Admitting: Neurosurgery

## 2023-12-25 ENCOUNTER — Other Ambulatory Visit: Payer: Self-pay | Admitting: Cardiology

## 2024-01-01 DIAGNOSIS — E0500 Thyrotoxicosis with diffuse goiter without thyrotoxic crisis or storm: Secondary | ICD-10-CM | POA: Diagnosis not present

## 2024-01-02 ENCOUNTER — Ambulatory Visit
Admission: RE | Admit: 2024-01-02 | Discharge: 2024-01-02 | Disposition: A | Discharge status: Home / Self Care | Source: Ambulatory Visit | Attending: Neurosurgery | Admitting: Neurosurgery

## 2024-01-02 DIAGNOSIS — Z981 Arthrodesis status: Secondary | ICD-10-CM | POA: Diagnosis not present

## 2024-01-02 DIAGNOSIS — M5412 Radiculopathy, cervical region: Secondary | ICD-10-CM

## 2024-01-02 DIAGNOSIS — M4802 Spinal stenosis, cervical region: Secondary | ICD-10-CM | POA: Diagnosis not present

## 2024-01-14 ENCOUNTER — Ambulatory Visit: Attending: Cardiology | Admitting: Cardiology

## 2024-01-14 ENCOUNTER — Encounter: Payer: Self-pay | Admitting: Cardiology

## 2024-01-14 ENCOUNTER — Other Ambulatory Visit

## 2024-01-14 VITALS — BP 120/82 | HR 63 | Resp 16 | Ht 69.0 in | Wt 237.2 lb

## 2024-01-14 DIAGNOSIS — E079 Disorder of thyroid, unspecified: Secondary | ICD-10-CM | POA: Diagnosis not present

## 2024-01-14 DIAGNOSIS — E0590 Thyrotoxicosis, unspecified without thyrotoxic crisis or storm: Secondary | ICD-10-CM | POA: Diagnosis not present

## 2024-01-14 DIAGNOSIS — E782 Mixed hyperlipidemia: Secondary | ICD-10-CM | POA: Diagnosis not present

## 2024-01-14 DIAGNOSIS — I480 Paroxysmal atrial fibrillation: Secondary | ICD-10-CM

## 2024-01-14 DIAGNOSIS — I10 Essential (primary) hypertension: Secondary | ICD-10-CM | POA: Diagnosis not present

## 2024-01-14 LAB — TSH: TSH: 2.41 m[IU]/L (ref 0.40–4.50)

## 2024-01-14 LAB — T3, FREE: T3, Free: 3.8 pg/mL (ref 2.3–4.2)

## 2024-01-14 LAB — T4, FREE: Free T4: 1 ng/dL (ref 0.8–1.8)

## 2024-01-14 NOTE — Patient Instructions (Signed)
 Medication Instructions:  No medication changes were made at this visit. Continue current regimen.   *If you need a refill on your cardiac medications before your next appointment, please call your pharmacy*  Lab Work: To be completed in 6 months: hemoglobin/hematocrit, BMP  If you have labs (blood work) drawn today and your tests are completely normal, you will receive your results only by: MyChart Message (if you have MyChart) OR A paper copy in the mail If you have any lab test that is abnormal or we need to change your treatment, we will call you to review the results.  Testing/Procedures: None ordered today.  Follow-Up: At [facility], you and your health needs are our priority.  As part of our continuing mission to provide you with exceptional heart care, our providers are all part of one team.  This team includes your primary Cardiologist (physician) and Advanced Practice Providers or APPs (Physician Assistants and Nurse Practitioners) who all work together to provide you with the care you need, when you need it.  Your next appointment:   6 month(s)  Provider:   Olinda Bertrand, DO

## 2024-01-14 NOTE — Progress Notes (Signed)
 Cardiology Office Note:  .   ID:  [name], DOB [birth year], MRN [id number] PCP:  Garner Jury  Former Cardiology Moss: N/A Scott Moss Cardiologist:  Olinda Bertrand, DO , [facility] (established care 09/05/23) Electrophysiologist:  None  Click to update primary MD,subspecialty MD or APP then REFRESH:1}    Chief Complaint  Patient presents with   Atrial Fibrillation    History of Present Illness: .   Scott Moss is a 48 y.o. Caucasian male whose past medical history and cardiovascular risk factors includes: Premature coronary artery disease family, hypertension, hyperlipidemia, hyperthyroidism aortic atherosclerosis.  Patient is referred to the practice for management of atrial fibrillation, new onset in the setting of hyperthyroidism.  Patient was started on methimazole  and being followed by endocrinology and I had transitioned him from diltiazem  to propranolol .  Given concerns for possible mild fluid overload and elevated BNP patient was started on Lasix  and symptomatically has improved.  He had an echocardiogram which notes preserved LVEF.  During last office visit the shared decision was to treat his underlying thyroid  disease and once he is euvolemic to reevaluate his rhythm.  If upon maintaining euthyroidism he still remains in A-fib then direct-current cardioversion would be indicated.  Since last office visit received a message from his endocrinologist that he is now hypothyroidism and if any procedures need to be done for his A-fib he could proceed forward.  Patient presents today for follow-up today  EKG today illustrates sinus rhythm.  Since last office visit patient states that he is no longer experiencing dyspnea on exertion. He has significantly reduced his alcohol consumption.  Used to drink 12 pack over 1.5 weeks and now drinking 1-2 beers per per week. Has been following up with endocrinology regularly, was hyperthyroid now  hypothyroid. Since last office visit patient had called us  back stating that he wanted to discontinue beta-blockers due to 10 pounds of weight gain and sleeping difficulties and therefore propranolol  was discontinued and he was started on Cardizem . I informed the patient and his wife today that his weight gain and sleep disturbances are more likely secondary to thyroid  disease i.e. now hypothyroid than true medication side effect Patient does not endorse evidence of bleeding.  Dad had MI at age of 38 which required coronary interventions.   Review of Systems: .   Review of Systems  Cardiovascular:  Negative for chest pain, claudication, dyspnea on exertion, irregular heartbeat, leg swelling, near-syncope, orthopnea, palpitations, paroxysmal nocturnal dyspnea and syncope.  Respiratory:  Negative for shortness of breath.   Hematologic/Lymphatic: Negative for bleeding problem.    Studies Reviewed:   EKG: EKG Interpretation Date/Time:  Wednesday January 14 2024 08:05:54 EDT Ventricular Rate:  64 PR Interval:  186 QRS Duration:  88 QT Interval:  402 QTC Calculation: 414 R Axis:   36  Text Interpretation: Normal sinus rhythm Normal ECG When compared with ECG of 24-Oct-2023 08:40, Sinus rhythm has replaced Atrial fibrillation Confirmed by Olinda Bertrand [phone]) on 01/14/2024 8:21:38 AM  Echocardiogram: 07/18/2015: LVEF 55-60%, mild LVH, normal diastolic parameters, no significant valvular heart disease.  09/24/2023:  1. Left ventricular ejection fraction, by estimation, is 55 to 60%. The left ventricle has normal function. The left ventricle has no regional wall motion abnormalities. There is mild left ventricular hypertrophy. Left ventricular diastolic parameters  are indeterminate.  2. Right ventricular systolic function is normal. The right ventricular size is normal. Tricuspid regurgitation signal is inadequate for assessing PA pressure.  3. Left  atrial size was mildly dilated.  4. The  mitral valve is normal in structure. Trivial mitral valve regurgitation. No evidence of mitral stenosis.  5. The aortic valve is grossly normal. Aortic valve regurgitation is not visualized. No aortic stenosis is present.  6. The inferior vena cava is normal in size with greater than 50% respiratory variability, suggesting right atrial pressure of 3 mmHg.   Stress Testing: 07/2015: The left ventricular ejection fraction is mildly decreased (45-54%). Nuclear stress EF: 51%. There was no ST segment deviation noted during stress The study is normal. This is a low risk study.  RADIOLOGY: NA  Risk Assessment/Calculations:   Click Here to Calculate/Change CHADS2VASc Score The patient's CHADS2-VASc score is 2, indicating a 2.2% annual risk of stroke.    Labs:       Latest Ref Rng & Units 09/05/2023    5:14 PM 08/18/2023    1:41 PM 01/06/2023    9:03 AM  CBC  WBC 3.4 - 10.8 x10E3/uL  5.9  5.9   Hemoglobin 13.0 - 17.7 g/dL 95.6  21.3  08.6   Hematocrit 37.5 - 51.0 % 46.2  44.9  47.1   Platelets 150 - 450 x10E3/uL  331  316.0        Latest Ref Rng & Units 09/11/2023    3:26 PM 09/05/2023    5:17 PM 09/05/2023    5:14 PM  BMP  Glucose 70 - 99 mg/dL 86  81  80   BUN 6 - 24 mg/dL 8  13  13    Creatinine 0.76 - 1.27 mg/dL 5.78  4.69  6.29   BUN/Creat Ratio 9 - 20 9  14  15    Sodium 134 - 144 mmol/L 143  142  141   Potassium 3.5 - 5.2 mmol/L 4.9  4.8  4.7   Chloride 96 - 106 mmol/L 105  105  105   CO2 20 - 29 mmol/L 25  20  22    Calcium  8.7 - 10.2 mg/dL 9.2  9.7  9.5       Latest Ref Rng & Units 09/11/2023    3:26 PM 09/05/2023    5:17 PM 09/05/2023    5:14 PM  CMP  Glucose 70 - 99 mg/dL 86  81  80   BUN 6 - 24 mg/dL 8  13  13    Creatinine 0.76 - 1.27 mg/dL 5.28  4.13  2.44   Sodium 134 - 144 mmol/L 143  142  141   Potassium 3.5 - 5.2 mmol/L 4.9  4.8  4.7   Chloride 96 - 106 mmol/L 105  105  105   CO2 20 - 29 mmol/L 25  20  22    Calcium  8.7 - 10.2 mg/dL 9.2  9.7  9.5     Lab  Results  Component Value Date   CHOL 115 08/20/2023   HDL 45 08/20/2023   LDLCALC 58 08/20/2023   TRIG 54 08/20/2023   CHOLHDL 2.6 08/20/2023   No results for input(s): LIPOA in the last 8760 hours. No components found for: NTPROBNP Recent Labs    09/05/23 1714 09/11/23 1526  PROBNP 1,234* 605*   Recent Labs    10/01/23 1525 12/08/23 0755 12/18/23 1131  TSH <0.01* 62.39* 4.25     Physical Exam:    Today's Vitals   01/14/24 0802  BP: 120/82  Pulse: 63  Resp: 16  SpO2: 95%  Weight: 237 lb 3.2 oz (107.6 kg)  Height: 5' 9 (1.753 m)  Body mass index is 35.03 kg/m. Wt Readings from Last 3 Encounters:  01/14/24 237 lb 3.2 oz (107.6 kg)  12/12/23 253 lb (114.8 kg)  10/24/23 232 lb 6.4 oz (105.4 kg)    Physical Exam  Constitutional: No distress.  hemodynamically stable  Neck: No JVD present.  Cardiovascular: Normal rate, regular rhythm, S1 normal and S2 normal. Exam reveals no gallop, no S3 and no S4.  No murmur heard. Pulmonary/Chest: Effort normal and breath sounds normal. No stridor. He has no wheezes. He has no rales.  Musculoskeletal:        General: No edema.     Cervical back: Neck supple.  Skin: Skin is warm.     Impression & Recommendation(s):  Impression:   ICD-10-CM   1. Paroxysmal atrial fibrillation (HCC)  I48.0 EKG 12-Lead    Basic metabolic panel with GFR    Hemoglobin and hematocrit, blood    Hemoglobin and hematocrit, blood    Basic metabolic panel with GFR    2. Benign hypertension  I10     3. Mixed hyperlipidemia  E78.2     4. Thyroid  disease  E07.9       Recommendation(s):  Paroxysmal atrial fibrillation (HCC) Was referred to the practice in January 2025 for new onset of atrial fibrillation. Likely brought on by underlying thyroid  disease, hyperthyroidism. Focused on rate control strategy and establishing care with endocrinology to maintain/achieving euthyroidism. Patient was placed on anticoagulation given his  CHA2DS2-VASc score. Remains on Eliquis  5 mg p.o. twice daily. Does not endorse evidence of bleeding. Has significantly cut down his alcohol consumption as mentioned above as well. Today EKG confirms underlying rhythm to be sinus. We discussed long-term anticoagulation given his CHA2DS2-VASc score and his propensity of possible A-fib burden given his underlying thyroid  disease.  Patient would like to continue with anticoagulation at this time. He does work in [employer] which predisposes him to possible increased risk of bleeding.  We also discussed alternatives to anticoagulation such as left atrial appendage occlusion device like watchman.  Patient and wife to consider this in the interim and will call us  back if interested.  Benign hypertension Office blood pressures are now well-controlled. Continue valsartan  160 mg p.o. every afternoon. Continue furosemide  20 mg p.o. every morning. Continue Cardizem  CD 180 mg p.o. daily  Mixed hyperlipidemia Currently on Crestor  10 mg p.o. daily.   He denies myalgia or other side effects. Most recent lipids dated August 20, 2023, independently reviewed as noted above.  LDL is 58 mg/dL.  Cardiology is following peripherally.  Thyroid  disease Initially hyperthyroid, then hypothyroid, and now euthyroid Remains on methimazole . Follows up with endocrinology   Orders Placed:  Orders Placed This Encounter  Procedures   Basic metabolic panel with GFR    Standing Status:   Future    Number of Occurrences:   1    Expected Date:   07/15/2024    Expiration Date:   01/13/2025   Hemoglobin and hematocrit, blood    Standing Status:   Future    Number of Occurrences:   1    Expected Date:   07/15/2024    Expiration Date:   01/13/2025   EKG 12-Lead   Final Medication List:    No orders of the defined types were placed in this encounter.   There are no discontinued medications.     Current Outpatient Medications:    acetaminophen  (TYLENOL ) 500 MG  tablet, Take 500 mg by mouth every 6 (six) hours as needed  for mild pain., Disp: , Rfl:    apixaban  (ELIQUIS ) 5 MG TABS tablet, Take 1 tablet (5 mg total) by mouth 2 (two) times daily., Disp: 180 tablet, Rfl: 3   diltiazem  (CARDIZEM  CD) 180 MG 24 hr capsule, TAKE 1 CAPSULE(180 MG) BY MOUTH DAILY, Disp: 90 capsule, Rfl: 3   famotidine  (PEPCID ) 20 MG tablet, Take 1 tablet (20 mg total) by mouth daily., Disp: 30 tablet, Rfl: 5   fexofenadine (ALLEGRA) 180 MG tablet, Take 180 mg by mouth daily., Disp: , Rfl:    furosemide  (LASIX ) 20 MG tablet, Take 1 tablet (20 mg total) by mouth in the morning., Disp: 90 tablet, Rfl: 3   methimazole  (TAPAZOLE ) 5 MG tablet, Take 0.5 tablets (2.5 mg total) by mouth daily., Disp: 45 tablet, Rfl: 1   rosuvastatin  (CRESTOR ) 10 MG tablet, TAKE 1 TABLET(10 MG) BY MOUTH DAILY, Disp: 90 tablet, Rfl: 1   valsartan  (DIOVAN ) 160 MG tablet, Take 1 tablet (160 mg total) by mouth at bedtime., Disp: 90 tablet, Rfl: 3  Consent:   NA  Disposition:   6 months follow-up sooner if needed  His questions and concerns were addressed to his satisfaction. He voices understanding of the recommendations provided during this encounter.    Signed, Awilda Bogus, [facility]  A Division of [facility] [address], [place], [place] [address]  [place], [place] [address]  01/14/2024 11:43 AM

## 2024-01-21 ENCOUNTER — Ambulatory Visit: Admitting: "Endocrinology

## 2024-01-21 ENCOUNTER — Encounter: Payer: Self-pay | Admitting: "Endocrinology

## 2024-01-21 VITALS — BP 116/80 | HR 78 | Ht 69.0 in | Wt 241.0 lb

## 2024-01-21 DIAGNOSIS — E0500 Thyrotoxicosis with diffuse goiter without thyrotoxic crisis or storm: Secondary | ICD-10-CM

## 2024-01-21 NOTE — Progress Notes (Signed)
 The patient reports they are currently: [place]. I spent 6-7 minutes on the VIDEO with the patient on the date of service. I spent an additional up to 5 minutes on pre- and post-visit activities on the date of service.   The patient was physically located in [place]  or a state in which I am permitted to provide care. The patient and/or parent/guardian understood that s/he may incur co-pays and cost sharing, and agreed to the telemedicine visit. The visit was reasonable and appropriate under the circumstances given the patient's presentation at the time.  The patient and/or parent/guardian has been advised of the potential risks and limitations of this mode of treatment (including, but not limited to, the absence of in-person examination) and has agreed to be treated using telemedicine. The patient's/patient's family's questions regarding telemedicine have been answered.   The patient and/or parent/guardian has also been advised to contact their provider's office for worsening conditions, and seek emergency medical treatment and/or call 911 if the patient deems either necessary.     Outpatient Endocrinology Note Jorge Newcomer, MD  01/21/24   Scott Moss 20-Jul-1976 [id number]  Referring Provider: Claudene Crystal, PA-C Primary Care Provider: Claudene Crystal, PA-C Subjective  No chief complaint on file.   Assessment & Plan  There are no diagnoses linked to this encounter.  Scott Moss has stopped methimazole  10 mg tid on 12/10/23 when TSH shifted from <0.01 to 62.39. Patient is currently biochemically hyperthyroid but clinically still hypothyroid.  Discussed the etiology for hyperthyroidism. Educated on thyroid  axis.  12/2023 Recommend the following: started methimazole  2.5 mg once a day.  Repeat labs sooner if symptoms of hyper or hypothyroidism develop.  -complications of untreated hyperthyroidism include atrial fibrillation, heart failure and osteoporosis -side effects of  Methimazole  include but not limited to allergic reaction, rash, bone marrow suppression, liver dysfunction and teratogenic potential -compliance and follow up needs    Left eye proptosis noted since 06/2023 Saw ophthalmologist, started tepezza   History of testicular cancer 11/13/23 reviewed thyroid  images and report: no nodules reported   I have reviewed current medications, nurse's notes, allergies, vital signs, past medical and surgical history, family medical history, and social history for this encounter. Counseled patient on symptoms, examination findings, lab findings, imaging results, treatment decisions and monitoring and prognosis. The patient understood the recommendations and agrees with the treatment plan. All questions regarding treatment plan were fully answered.   No follow-ups on file.  Jorge Newcomer, MD  01/21/24   I have reviewed current medications, nurse's notes, allergies, vital signs, past medical and surgical history, family medical history, and social history for this encounter. Counseled patient on symptoms, examination findings, lab findings, imaging results, treatment decisions and monitoring and prognosis. The patient understood the recommendations and agrees with the treatment plan. All questions regarding treatment plan were fully answered.   History of Present Illness Scott Moss is a 48 y.o. year old male who presents to our clinic with hyperthyroidism diagnosed in 2025.    Left eye became bulgy around 06/2023   Symptoms suggestive of HYPOTHYROIDISM:               fatigue Yes              weight gain Yes              cold intolerance  Yes, varies between hot and cold               constipation  Yes  Symptoms suggestive of HYPERTHYROIDISM:  weight loss  No, resolved  heat intolerance Yes, varies between hot and cold  hyperdefecation  No, resolved  palpitations  No, resolved   Compressive symptoms:  dysphagia  No, resolved  dysphonia   No positional dyspnea (especially with simultaneous arms elevation)  No  Smokes  No On biotin  No Personal history of head/neck surgery/irradiation  Yes, spinal surgery  Adverse Drug Effects from Methimazole  (MMI): rash No fever No throat pain No arthritis No mouth ulcers No jaundice No loss of appetite No lymphadenopathy No  Grave's Ophthalmopathy Clinical Activity Score: 0/9, but evident Left eye proptosis, saw ophthalmology   Physical Exam  BP 116/80   Pulse 78   Ht 5' 9 (1.753 m)   Wt 241 lb (109.3 kg)   SpO2 98%   BMI 35.59 kg/m  Constitutional: well developed, well nourished Head: normocephalic, atraumatic, + L eye exophthalmos Eyes: sclera anicteric, no redness Neck: no thyromegaly, no thyroid  tenderness; no nodules palpated Lungs: normal respiratory effort Neurology: alert and oriented, + fine hand tremor Skin: dry, no appreciable rashes Musculoskeletal: no appreciable defects Psychiatric: normal mood and affect  Allergies Allergies  Allergen Reactions   Benicar  [Olmesartan ]     Rash, allergic reaction    Current Medications Patient's Medications  New Prescriptions   No medications on file  Previous Medications   ACETAMINOPHEN  (TYLENOL ) 500 MG TABLET    Take 500 mg by mouth every 6 (six) hours as needed for mild pain.   APIXABAN  (ELIQUIS ) 5 MG TABS TABLET    Take 1 tablet (5 mg total) by mouth 2 (two) times daily.   DILTIAZEM  (CARDIZEM  CD) 180 MG 24 HR CAPSULE    TAKE 1 CAPSULE(180 MG) BY MOUTH DAILY   FAMOTIDINE  (PEPCID ) 20 MG TABLET    Take 1 tablet (20 mg total) by mouth daily.   FEXOFENADINE (ALLEGRA) 180 MG TABLET    Take 180 mg by mouth daily.   FUROSEMIDE  (LASIX ) 20 MG TABLET    Take 1 tablet (20 mg total) by mouth in the morning.   METHIMAZOLE  (TAPAZOLE ) 5 MG TABLET    Take 0.5 tablets (2.5 mg total) by mouth daily.   ROSUVASTATIN  (CRESTOR ) 10 MG TABLET    TAKE 1 TABLET(10 MG) BY MOUTH DAILY   VALSARTAN  (DIOVAN ) 160 MG TABLET    Take 1 tablet  (160 mg total) by mouth at bedtime.  Modified Medications   No medications on file  Discontinued Medications   No medications on file    Past Medical History Past Medical History:  Diagnosis Date   Allergy    Arthritis    Atypical chest pain 07/06/2015   Non-obstructive CAD on [facility] 04/2010.   Chondromalacia of left knee 09/2012   Complication of anesthesia    Family history of cancer    multiple family members, multiple types.   Sees Dr. Maria Shiner   Family history of cancer    Family history of premature CAD    GERD (gastroesophageal reflux disease)    H/O cardiovascular stress test 07/14/2015   myocardial perfusion scan, normal, EF 45-54% mildy reduced LV function, Dr. Theodis Fiscal   H/O echocardiogram 07/18/2015   LV mild hypertrophy, 55-60%EF, Dr. Theodis Fiscal   High cholesterol    Hypertension    Obesity    Overweight    PONV (postoperative nausea and vomiting)    Testicular cancer (HCC) 02/2005   Dr. Maria Shiner    Past Surgical History Past Surgical History:  Procedure  Laterality Date   ANTERIOR CERVICAL DECOMP/DISCECTOMY FUSION  11/11/2008   C5-6   APPENDECTOMY     CARDIAC CATHETERIZATION  04/13/2010   no disease   CERVICAL SPINE SURGERY  08/05/2013   Dr. Jaycee Metro   COLONOSCOPY     prior, no problems per patient, year unknown, possibly in early 30s.   HIP ARTHROPLASTY Left 03/2020   HIP ARTHROPLASTY Right 04/2020   KNEE ARTHROSCOPY Right    KNEE ARTHROSCOPY Left 10/19/2015   Procedure: LEFT KNEE SCOPE CHONDROPLASTY;  Surgeon: Ferd Householder, MD;  Location: [facility];  Service: Orthopedics;  Laterality: Left;   KNEE ARTHROSCOPY WITH LATERAL RELEASE Left 09/18/2012   Procedure: KNEE ARTHROSCOPY WITH LATERAL RELEASE;  Surgeon: Ferd Householder, MD;  Location: [facility];  Service: Orthopedics;  Laterality: Left;  WITH DEBRIDEMENT AND SHAVING(CHONDROPLASTY), Excision Medial [place]   NASAL SINUS SURGERY  08/05/2012   PARTIAL KNEE ARTHROPLASTY  Left 08/15/2016   Procedure: PATELLA FEMORAL REPLACEMENT LEFT KNEE;  Surgeon: Ferd Householder, MD;  Location: [facility];  Service: Orthopedics;  Laterality: Left;   RADICAL ORCHIECTOMY  02/07/2005   left; with left testicular implant    Family History family history includes Cancer in his father and mother; Cancer (age of onset: 62) in his sister; Colon cancer in his maternal uncle; Diabetes in his maternal uncle; Esophageal cancer in his maternal uncle; Heart attack in his maternal uncle and paternal uncle; Heart attack (age of onset: 15) in his father; Heart disease in his father; Hypertension in his father, mother, and sister; Lung cancer in his father; Migraines in his mother and sister; Ovarian cancer in his mother; Skin cancer in his father; Stroke in his mother.  Social History Social History   Socioeconomic History   Marital status: Married    Spouse name: Not on file   Number of children: 2   Years of education: Not on file   Highest education level: Not on file  Occupational History   Occupation: truck driver  Tobacco Use   Smoking status: Never    Passive exposure: Never   Smokeless tobacco: Never   Tobacco comments:    never used tobacco  Vaping Use   Vaping status: Never Used  Substance and Sexual Activity   Alcohol use: Yes    Alcohol/week: 3.0 standard drinks of alcohol    Types: 3 Cans of beer per week    Comment: social   Drug use: No   Sexual activity: Yes  Other Topics Concern   Not on file  Social History Narrative   Married, 2 children, is a Naval architect for [employer].   08/2022      Social Drivers of Health   Financial Resource Strain: Not on file  Food Insecurity: Not on file  Transportation Needs: Not on file  Physical Activity: Not on file  Stress: Not on file  Social Connections: Not on file  Intimate Partner Violence: Not on file    Laboratory Investigations Lab Results  Component Value Date   TSH 2.41 01/14/2024   TSH  4.25 12/18/2023   TSH 62.39 (H) 12/08/2023   FREET4 1.0 01/14/2024   FREET4 1.0 12/18/2023   FREET4 0.2 (L) 12/08/2023     Lab Results  Component Value Date   TSI 282 (H) 08/27/2023     No components found for: TRAB   Lab Results  Component Value Date   CHOL 115 08/20/2023   Lab Results  Component Value Date   HDL 45 08/20/2023   Lab Results  Component Value Date   LDLCALC 58 08/20/2023   Lab Results  Component Value Date   TRIG 54 08/20/2023   Lab Results  Component Value Date   CHOLHDL 2.6 08/20/2023   Lab Results  Component Value Date   CREATININE 0.89 09/11/2023   Lab Results  Component Value Date   GFR 94.61 01/06/2023      Component Value Date/Time   NA 143 09/11/2023 1526   K 4.9 09/11/2023 1526   CL 105 09/11/2023 1526   CO2 25 09/11/2023 1526   GLUCOSE 86 09/11/2023 1526   GLUCOSE 96 01/06/2023 0903   BUN 8 09/11/2023 1526   CREATININE 0.89 09/11/2023 1526   CREATININE 1.02 02/06/2017 0659   CALCIUM  9.2 09/11/2023 1526   PROT 6.8 08/20/2023 1107   ALBUMIN 4.1 08/20/2023 1107   AST 22 08/20/2023 1107   ALT 29 08/20/2023 1107   ALKPHOS 135 (H) 08/20/2023 1107   BILITOT 0.8 08/20/2023 1107   GFRNONAA >60 12/17/2022 1855   GFRAA 106 12/16/2019 1106      Latest Ref Rng & Units 09/11/2023    3:26 PM 09/05/2023    5:17 PM 09/05/2023    5:14 PM  BMP  Glucose 70 - 99 mg/dL 86  81  80   BUN 6 - 24 mg/dL 8  13  13    Creatinine 0.76 - 1.27 mg/dL 8.29  5.62  1.30   BUN/Creat Ratio 9 - 20 9  14  15    Sodium 134 - 144 mmol/L 143  142  141   Potassium 3.5 - 5.2 mmol/L 4.9  4.8  4.7   Chloride 96 - 106 mmol/L 105  105  105   CO2 20 - 29 mmol/L 25  20  22    Calcium  8.7 - 10.2 mg/dL 9.2  9.7  9.5        Component Value Date/Time   WBC 5.9 08/18/2023 1341   WBC 5.9 01/06/2023 0903   RBC 5.22 08/18/2023 1341   RBC 5.21 01/06/2023 0903   HGB 15.5 09/05/2023 1714   HGB 16.4 06/20/2014 1026   HGB 16.4 06/04/2007 0755   HCT 46.2 09/05/2023 1714    HCT 47.1 06/20/2014 1026   HCT 46.3 06/04/2007 0755   PLT 331 08/18/2023 1341   MCV 86 08/18/2023 1341   MCV 87 06/20/2014 1026   MCV 88.2 06/04/2007 0755   MCH 28.7 08/18/2023 1341   MCH 30.3 12/17/2022 1855   MCHC 33.4 08/18/2023 1341   MCHC 32.9 01/06/2023 0903   RDW 12.2 08/18/2023 1341   RDW 11.9 06/20/2014 1026   RDW 13.0 06/04/2007 0755   LYMPHSABS 1.7 08/18/2023 1341   LYMPHSABS 2.1 06/20/2014 1026   LYMPHSABS 1.4 06/04/2007 0755   MONOABS 0.6 01/06/2023 0903   MONOABS 0.4 06/04/2007 0755   EOSABS 0.1 08/18/2023 1341   EOSABS 0.2 06/20/2014 1026   BASOSABS 0.0 08/18/2023 1341   BASOSABS 0.1 06/20/2014 1026   BASOSABS 0.0 06/04/2007 0755      Parts of this note may have been dictated using voice recognition software. There may be variances in spelling and vocabulary which are unintentional. Not all errors are proofread. Please notify the [facility] if any discrepancies are noted or if the meaning of any statement is not clear.

## 2024-01-29 DIAGNOSIS — E0500 Thyrotoxicosis with diffuse goiter without thyrotoxic crisis or storm: Secondary | ICD-10-CM | POA: Diagnosis not present

## 2024-01-29 DIAGNOSIS — M5412 Radiculopathy, cervical region: Secondary | ICD-10-CM | POA: Diagnosis not present

## 2024-02-25 ENCOUNTER — Other Ambulatory Visit: Payer: Self-pay | Admitting: Medical

## 2024-02-25 DIAGNOSIS — R112 Nausea with vomiting, unspecified: Secondary | ICD-10-CM

## 2024-02-25 MED ORDER — FAMOTIDINE 20 MG PO TABS: 20.0000 mg | ORAL_TABLET | Freq: Every day | ORAL | 5 refills | Status: DC

## 2024-02-25 MED ORDER — ROSUVASTATIN CALCIUM 10 MG PO TABS: ORAL_TABLET | ORAL | 1 refills | Status: DC

## 2024-02-25 NOTE — Telephone Encounter (Signed)
 Copied from CRM #[id number]. Topic: Clinical - Medication Refill >> Feb 25, 2024  9:49 AM Carlyon D wrote: Medication:  famotidine  (PEPCID ) 20 MG tablet And  rosuvastatin  (CRESTOR ) 10 MG tablet Pt would like the 90 day supply.     Has the patient contacted their pharmacy? Yes (Agent: If no, request that the patient contact the pharmacy for the refill. If patient does not wish to contact the pharmacy document the reason why and proceed with request.) (Agent: If yes, when and what did the pharmacy advise?)  This is the patient's preferred pharmacy:   [facility] [phone] - [place], [place] - [address] AT [facility] [address] [place] [address] Phone: [phone] Fax: [phone]   Is this the correct pharmacy for this prescription? Yes If no, delete pharmacy and type the correct one.   Has the prescription been filled recently? Yes  Is the patient out of the medication? No has enough until Friday.   Has the patient been seen for an appointment in the last year OR does the patient have an upcoming appointment? Yes  Can we respond through MyChart? Yes  Agent: Please be advised that Rx refills may take up to 3 business days. We ask that you follow-up with your pharmacy.

## 2024-02-26 DIAGNOSIS — E0500 Thyrotoxicosis with diffuse goiter without thyrotoxic crisis or storm: Secondary | ICD-10-CM | POA: Diagnosis not present

## 2024-03-08 ENCOUNTER — Encounter: Payer: Self-pay | Admitting: Family Medicine

## 2024-03-08 ENCOUNTER — Encounter: Payer: Self-pay | Admitting: Medical

## 2024-03-08 ENCOUNTER — Ambulatory Visit
Admission: RE | Admit: 2024-03-08 | Discharge: 2024-03-08 | Disposition: A | Discharge status: Home / Self Care | Source: Ambulatory Visit | Attending: Family Medicine | Admitting: Family Medicine

## 2024-03-08 ENCOUNTER — Ambulatory Visit: Admitting: Family Medicine

## 2024-03-08 VITALS — BP 140/90 | HR 83 | Temp 98.9°F | Wt 235.2 lb

## 2024-03-08 DIAGNOSIS — J988 Other specified respiratory disorders: Secondary | ICD-10-CM

## 2024-03-08 DIAGNOSIS — B9789 Other viral agents as the cause of diseases classified elsewhere: Secondary | ICD-10-CM

## 2024-03-08 DIAGNOSIS — R051 Acute cough: Secondary | ICD-10-CM | POA: Diagnosis not present

## 2024-03-08 DIAGNOSIS — I10 Essential (primary) hypertension: Secondary | ICD-10-CM | POA: Diagnosis not present

## 2024-03-08 DIAGNOSIS — J9811 Atelectasis: Secondary | ICD-10-CM | POA: Diagnosis not present

## 2024-03-08 LAB — POCT RAPID STREP A (OFFICE): Rapid Strep A Screen: NEGATIVE

## 2024-03-08 MED ORDER — METHYLPREDNISOLONE 4 MG PO TBPK
ORAL_TABLET | ORAL | 0 refills | Status: DC
Start: 2024-03-08 — End: 2024-05-06

## 2024-03-08 NOTE — Progress Notes (Signed)
   Name: Scott Moss   Date of Visit: 03/08/24   Date of last visit with me: Visit date not found   CHIEF COMPLAINT:  Chief Complaint  Patient presents with   Acute Visit    Sore throat, headache, coughing, body ache, chills, fever.        HPI:  Discussed the use of AI scribe software for clinical note transcription with the patient, who gave verbal consent to proceed.  History of Present Illness   Scott Moss is a 48 year old male who presents with sore throat, cough, and chest pain.  He began feeling unwell after returning from a vacation in [place], [place] , where he was exposed to children who later tested positive for strep throat. Initially, he experienced a severe headache, followed by a sore throat and a persistent cough.  The cough is intense, causing chest pain that radiates to his back. He feels slightly short of breath, primarily due to the coughing.  He has body aches and a fever reaching 102F, accompanied by chills. He notes that his whole body hurts.  He denies smoking and has not mentioned any current medications he is taking for these symptoms.         OBJECTIVE:       08/20/2023    9:37 AM  Depression screen PHQ 2/9  Decreased Interest 0  Down, Depressed, Hopeless 0  PHQ - 2 Score 0     BP Readings from Last 3 Encounters:  03/08/24 (!) 140/90  01/21/24 116/80  01/14/24 120/82    BP (!) 140/90   Pulse 83   Temp 98.9 F (37.2 C)   Wt 235 lb 3.2 oz (106.7 kg)   SpO2 96%   BMI 34.73 kg/m    Physical Exam   GENERAL: Oxygen saturation normal. HEENT: Throat red with exudates. CHEST: Wheeze on cough.      Physical Exam Constitutional:      Appearance: Normal appearance.  HENT:     Mouth/Throat:     Pharynx: Oropharyngeal exudate present.  Cardiovascular:     Rate and Rhythm: Normal rate and regular rhythm.     Pulses: Normal pulses.     Heart sounds: Normal heart sounds.  Pulmonary:     Effort: Pulmonary effort is  normal. No respiratory distress.     Breath sounds: No stridor. Wheezing present. No rhonchi or rales.  Chest:     Chest wall: No tenderness.  Neurological:     Mental Status: He is alert.     ASSESSMENT/PLAN:   Assessment & Plan Viral respiratory infection  Acute cough  Hypertension, unspecified type    Assessment and Plan    Acute respiratory infection Acute respiratory infection with cough, sore throat, fever, and chest pain. Negative strep test. Differential includes viral infection and less likely pneumonia. Wheezing noted. Oxygen levels satisfactory. - Order chest x-ray to evaluate pneumonia. - Prescribe steroid pack for symptom relief. - Instructed to pick up medication from [facility] in [place] and follow package instructions. - Provided doctor's note for work absence.  - Patients blood pressure elvated today which could be from current viral illness causing an acute on chronic issue.        Coleta Grosshans A. Vita MD [facility]

## 2024-03-08 NOTE — Addendum Note (Signed)
 Addended by: LATTIE CARLO BROCKS on: 03/08/2024 02:20 PM   Modules accepted: Orders

## 2024-03-11 ENCOUNTER — Ambulatory Visit: Payer: Self-pay | Admitting: Family Medicine

## 2024-03-18 DIAGNOSIS — E0500 Thyrotoxicosis with diffuse goiter without thyrotoxic crisis or storm: Secondary | ICD-10-CM | POA: Diagnosis not present

## 2024-03-28 DIAGNOSIS — E0500 Thyrotoxicosis with diffuse goiter without thyrotoxic crisis or storm: Secondary | ICD-10-CM

## 2024-04-08 DIAGNOSIS — E0500 Thyrotoxicosis with diffuse goiter without thyrotoxic crisis or storm: Secondary | ICD-10-CM | POA: Diagnosis not present

## 2024-04-25 ENCOUNTER — Encounter: Payer: Self-pay | Admitting: Cardiology

## 2024-05-04 ENCOUNTER — Ambulatory Visit: Attending: Medical | Admitting: Audiologist

## 2024-05-04 DIAGNOSIS — H90A32 Mixed conductive and sensorineural hearing loss, unilateral, left ear with restricted hearing on the contralateral side: Secondary | ICD-10-CM | POA: Insufficient documentation

## 2024-05-04 DIAGNOSIS — H918X3 Other specified hearing loss, bilateral: Secondary | ICD-10-CM | POA: Diagnosis not present

## 2024-05-04 NOTE — Procedures (Signed)
  Outpatient Audiology and [facility] [address] [place], [place]  [address] [phone]  AUDIOLOGICAL  EVALUATION  NAME: Scott Moss     DOB:   1975-11-28      MRN: [id number]                                                                                     DATE: 05/04/2024     REFERENT: Bulah Alm RAMAN, PA-C STATUS: Outpatient DIAGNOSIS: Asymmetric Mixed Hearing Loss Bilateral, Ototoxic Monitoring     History: Scott Moss was seen for an audiological evaluation to monitor his hearing loss. Scott Moss has an asymmetric hearing loss. He has seen Otolaryngology and had an MRI. All results were unremarkable. He is on medication for his thyroid  that can be ototoxic. Scott Moss has three more treatments of this medication. He has no new symptoms since his hearing test in 2024.  Scott Moss denies pain, pressure, or tinnitus.  Scott Moss no significant history of hazardous noise exposure.  Medical history shows no new risk for hearing loss.    Evaluation:  Otoscopy showed a clear view of the tympanic membranes, bilaterally Tympanometry results were consistent with flat response in the right ear and normal middle ear function in the left.  See Otolaryngology note 08/21/23 for landmarks of tympanic membranes.  Audiometric testing was completed using Conventional Audiometry techniques with insert earphones and supraural headphones. Test results are consistent with mixed hearing loss bilaterally. See audiogram below. Speech Recognition Thresholds were obtained at  35dB HL in the right ear and at 25dB HL in the left ear. Word Recognition Testing was 100% in each ear at 65dB masked.   Results:  The test results were reviewed with Scott Moss. Scott Moss has had a significant progression in his hearing loss, worse in the right ear. He needs to continue having it monitored. He is still not a good hearing aid candidate, due to him not noticing the hearing loss and normal hearing to mild loss 250-2kHz bilaterally.   Audiogram printed and provided to [employer].     Recommendations: Hearing has decreased significantly. Continued monitoring necessary. Follow up scheduled for 08/09/2024. This will be after his last round of Thyroid  Eye infusion treatment.    29 minutes spent testing and counseling on results.   If you have any questions please feel free to contact me at [phone].  Lauraine Ka Stalnaker Au.D.  Audiologist   05/04/2024  2:55 PM  Cc: Bulah Alm RAMAN, PA-C

## 2024-05-05 ENCOUNTER — Ambulatory Visit: Payer: Self-pay

## 2024-05-05 NOTE — Telephone Encounter (Signed)
 FYI Only or Action Required?: FYI only for provider.  Patient was last seen in primary care on 03/08/2024 by Vita Morrow, MD.  Called Nurse Triage reporting Belepharitis.  Symptoms began today.  Interventions attempted: Other: Warm compress, eye drops.  Symptoms are: stable.  Triage Disposition: See Physician Within 24 Hours  Patient/caregiver understands and will follow disposition?: Yes Reason for Disposition  MODERATE-SEVERE eyelid swelling on one side  (Exception: Due to a mosquito bite.)  Answer Assessment - Initial Assessment Questions Used warm compress and eye drops this morning, moderate relief, started swelling up again and yellow drainage coming out.  1. ONSET: When did the swelling start? (e.g., minutes, hours, days)     This morning, eye was swollen shut  2. LOCATION: What part of the eyelids is swollen?     Left eye  3. SEVERITY: How swollen is it?     Swollen shut this morning  4. ITCHING: Is there any itching? If Yes, ask: How much?   (Scale 1-10; mild, moderate or severe)     Yes  5. PAIN: Is the swelling painful to touch? If Yes, ask: How painful is it?   (Scale 1-10; mild, moderate or severe)     Yes  6. FEVER: Do you have a fever? If Yes, ask: What is it, how was it measured, and when did it start?      No  7. OTHER SYMPTOMS: Do you have any other symptoms? (e.g., blurred vision, eye discharge, rash, runny nose)     Yellow eye discharge  Protocols used: Eye - Swelling-A-AH  Copied from CRM #[id number]. Topic: Clinical - Red Word Triage >> May 05, 2024  3:06 PM Deaijah H wrote: Red Word that prompted transfer to Nurse Triage: Left eye swelling, wouldn't open at first. Yellow pus and starting to swell shut

## 2024-05-06 ENCOUNTER — Ambulatory Visit: Admitting: Medical

## 2024-05-06 ENCOUNTER — Encounter: Payer: Self-pay | Admitting: Medical

## 2024-05-06 VITALS — BP 134/80 | HR 80 | Temp 97.9°F | Ht 69.0 in | Wt 239.4 lb

## 2024-05-06 DIAGNOSIS — H109 Unspecified conjunctivitis: Secondary | ICD-10-CM

## 2024-05-06 IMAGING — DX DG ABDOMEN 1V
1 series · 1 of 1 positions shown · non-contrast
Comparison: Abdomen and pelvis CT dated 03/05/2018

CLINICAL DATA: Right upper quadrant abdominal pain for 1 week with
nausea, vomiting and diarrhea. Previous appendectomy.

EXAM:
ABDOMEN - 1 VIEW

[abdomen standing ap]
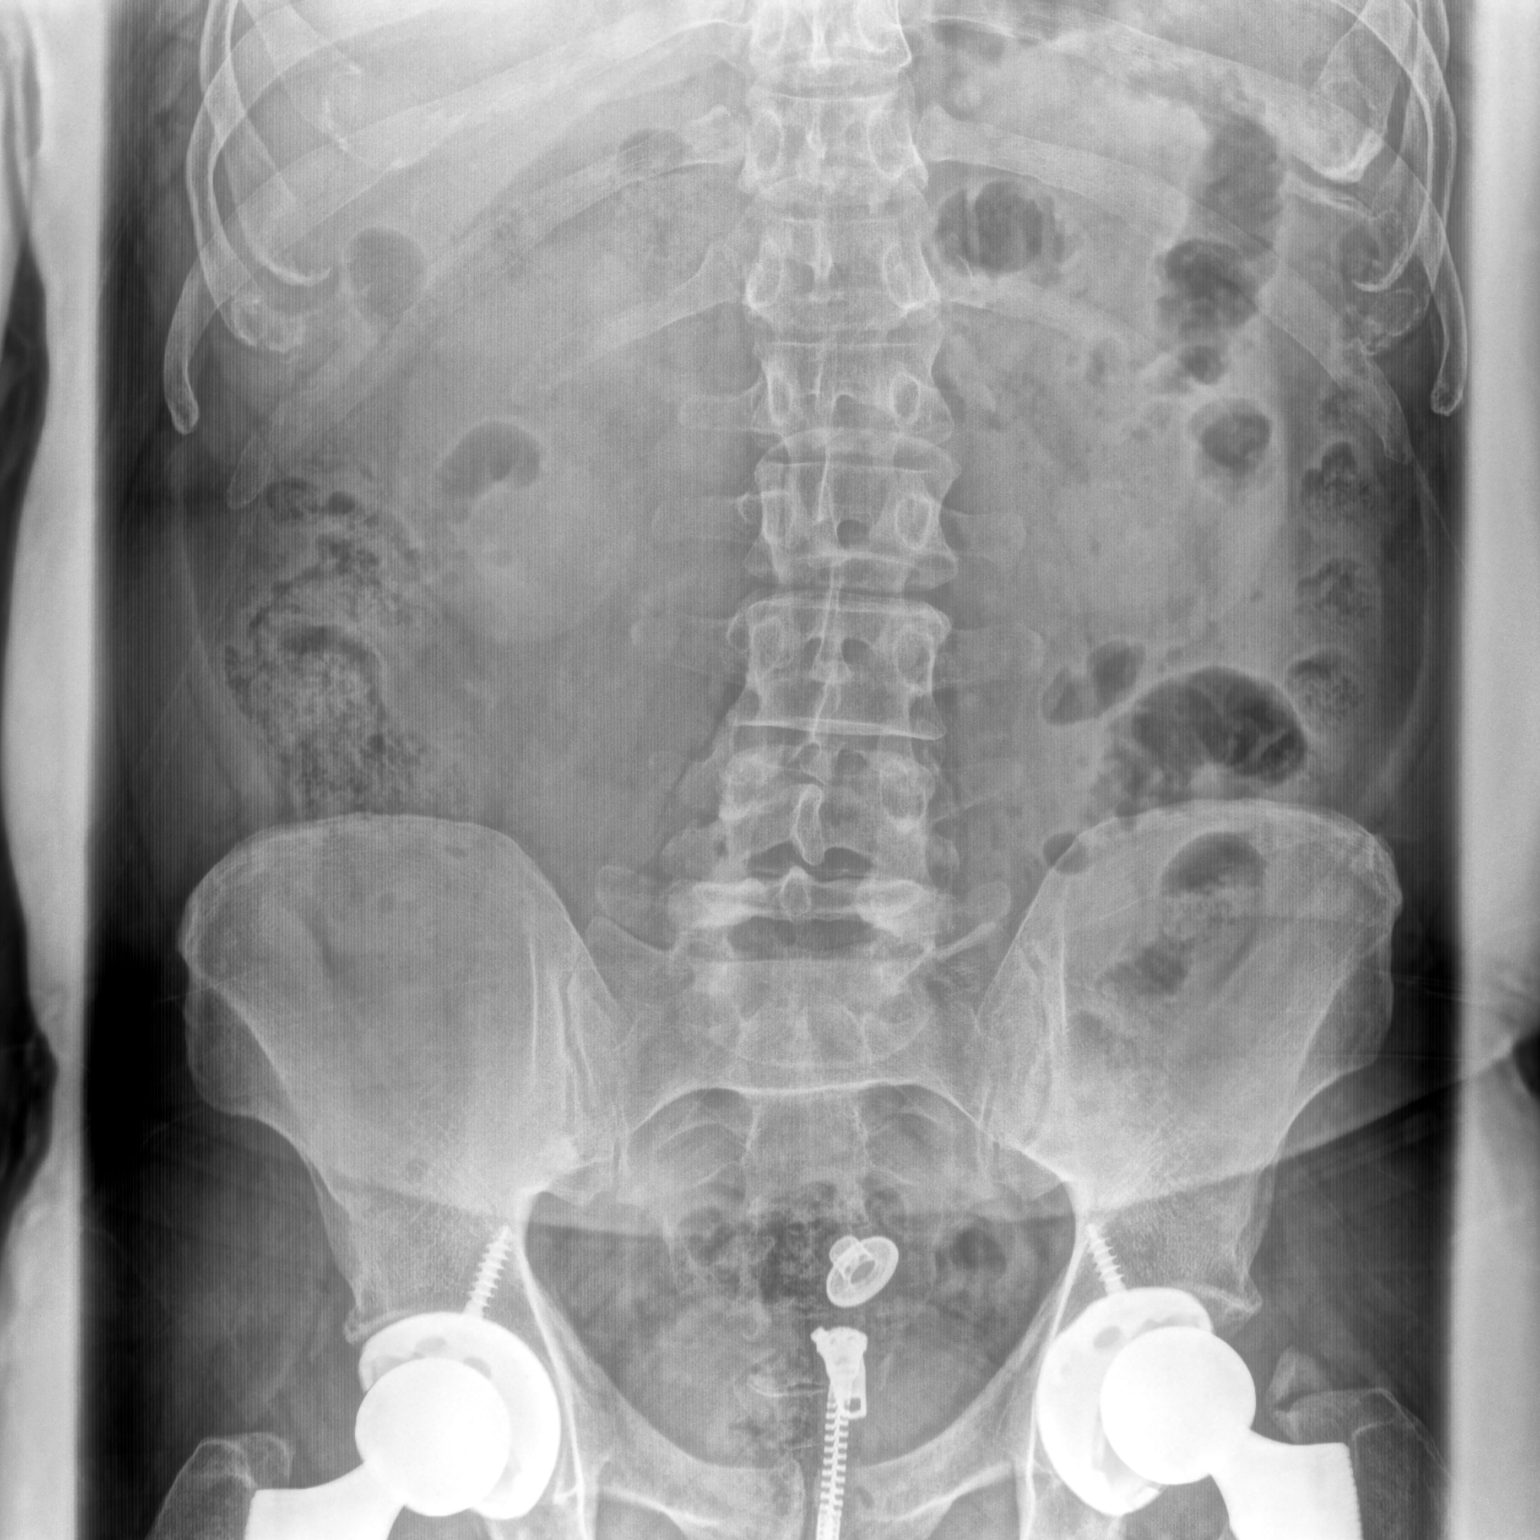

[1 of 1 positions shown; findings below may reference images not displayed]

FINDINGS: Normal bowel gas pattern. Bilateral total hip prostheses. Minimal
levoconvex thoracolumbar rotary scoliosis.
IMPRESSION: No acute abnormality.

## 2024-05-06 IMAGING — CT CT ABD-PELV W/ CM
2 of 5 series · 16 of 46 positions shown, 18 images · IV contrast (Omnipaque or Isovue)
Comparison: 03/05/2018.

CLINICAL DATA: Severe mid to lower abdominal pain for 4 days.
Coughing. Nausea. Diarrhea this morning. History of testicular
carcinoma.

EXAM:
CT ABDOMEN AND PELVIS WITH CONTRAST
TECHNIQUE: Multidetector CT imaging of the abdomen and pelvis was performed
using the standard protocol following bolus administration of
intravenous contrast.

[Series 2: axial st · axial · 0.86mm/px · z∈[+818,+1283]mm · 13 of 107 slices shown, 15 images]
[im 7/107  soft-tissue]
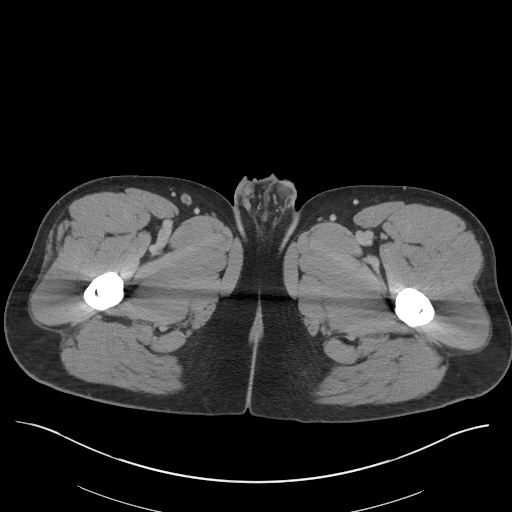
[im 7/107  bone]
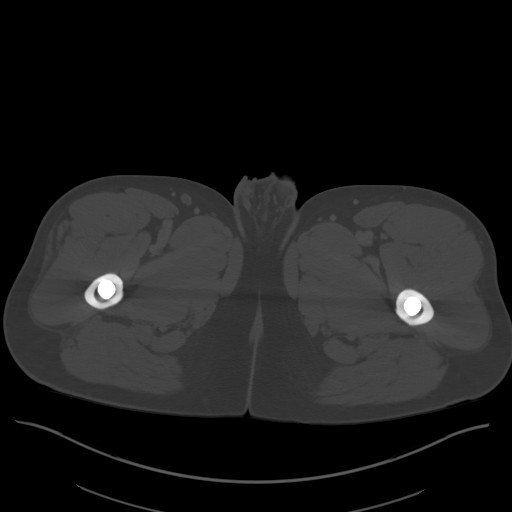
[im 13/107  soft-tissue]
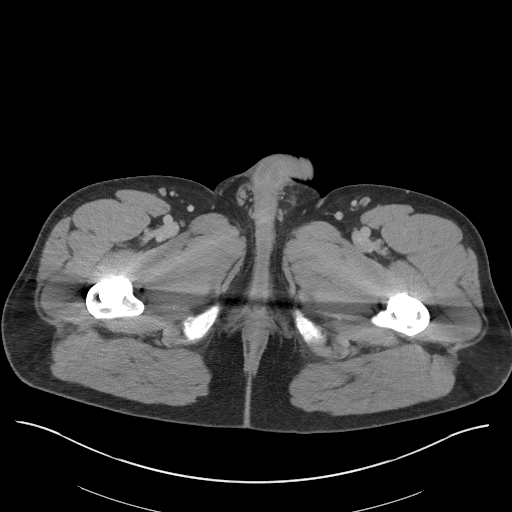
[im 25/107  soft-tissue]
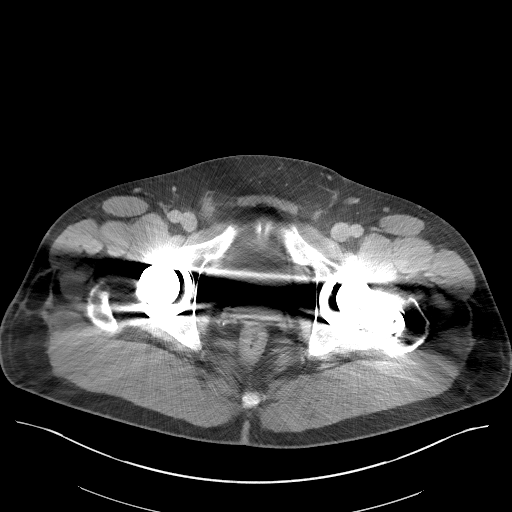
[im 32/107  soft-tissue]
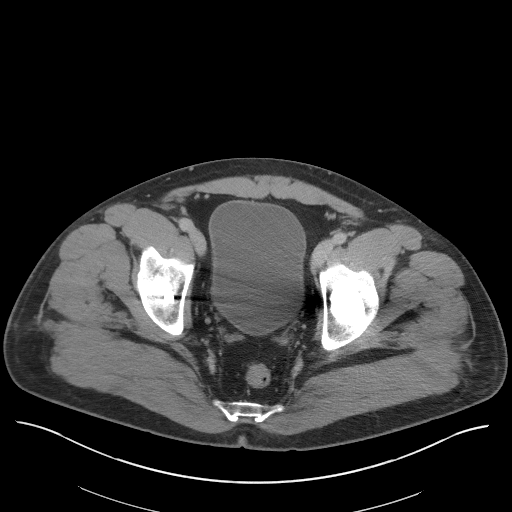
[im 38/107  soft-tissue]
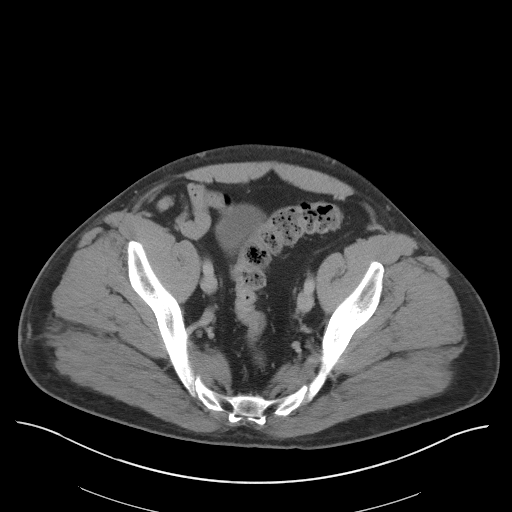
[im 44/107  soft-tissue]
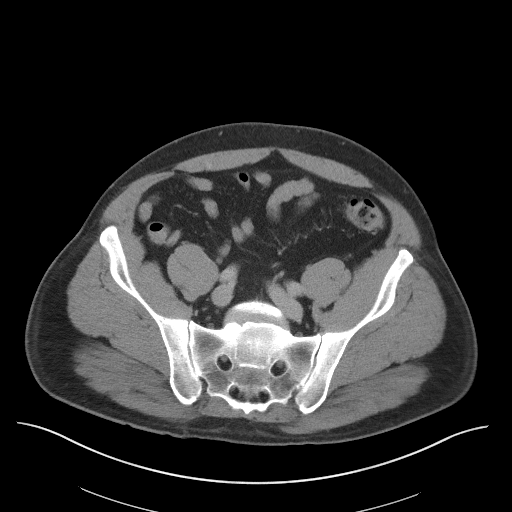
[im 57/107  soft-tissue]
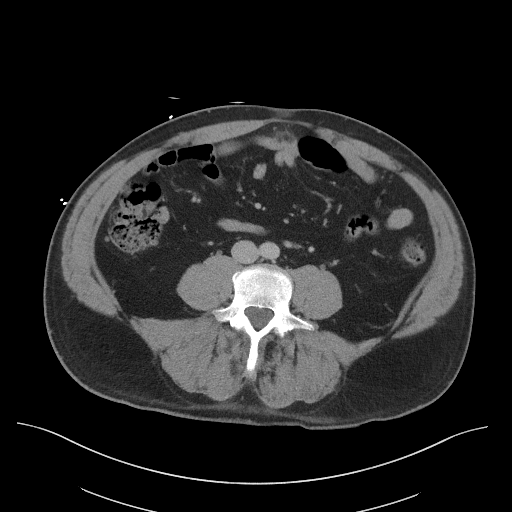
[im 63/107  soft-tissue]
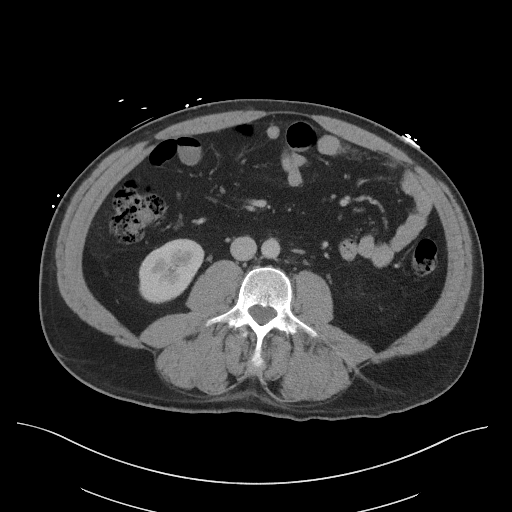
[im 69/107  soft-tissue]
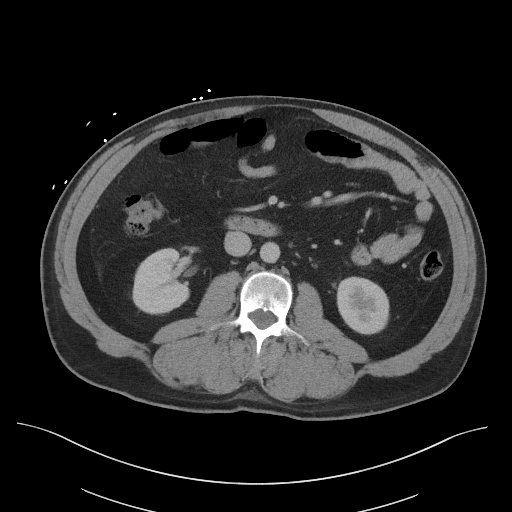
[im 69/107  bone]
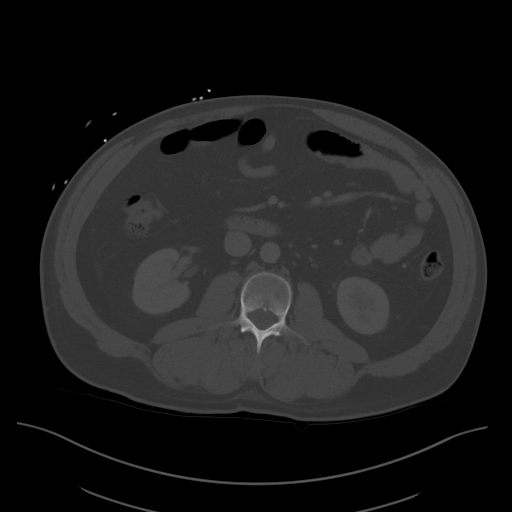
[im 75/107  soft-tissue]
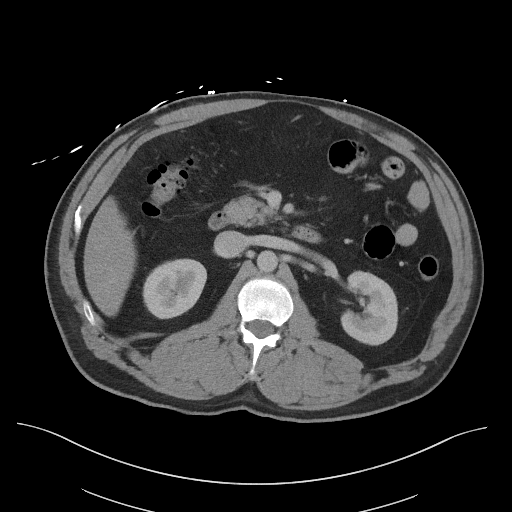
[im 82/107  soft-tissue]
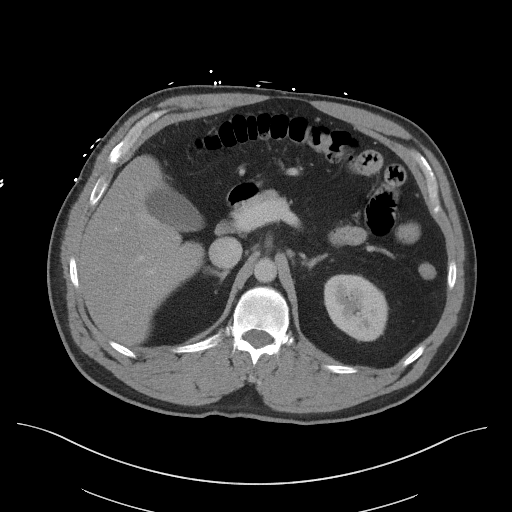
[im 94/107  soft-tissue]
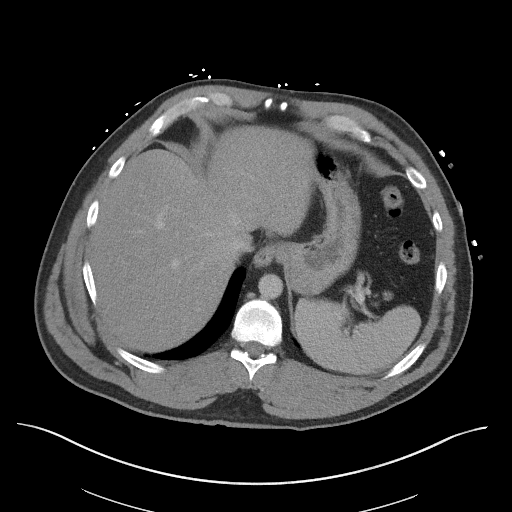
[im 100/107  soft-tissue]
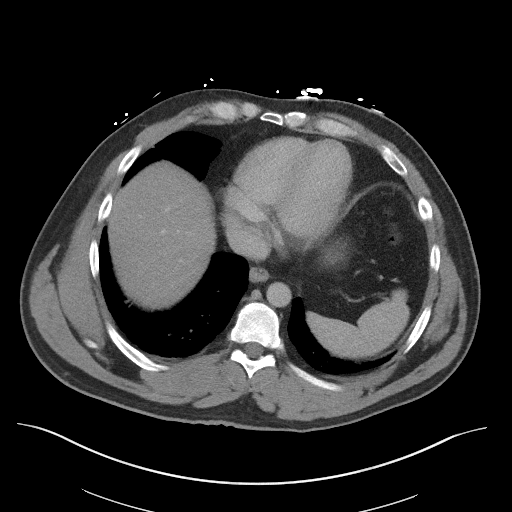

[Series 5: coronal st · coronal · 0.89mm/px · 3 of 114 slices shown]
[im 38/114  soft-tissue]
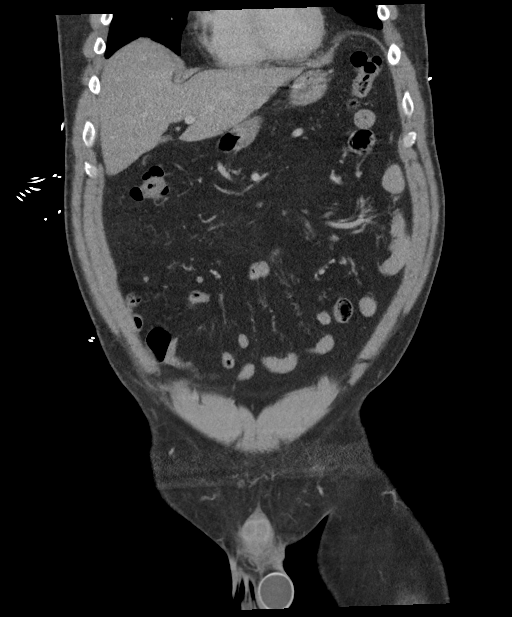
[im 51/114  soft-tissue]
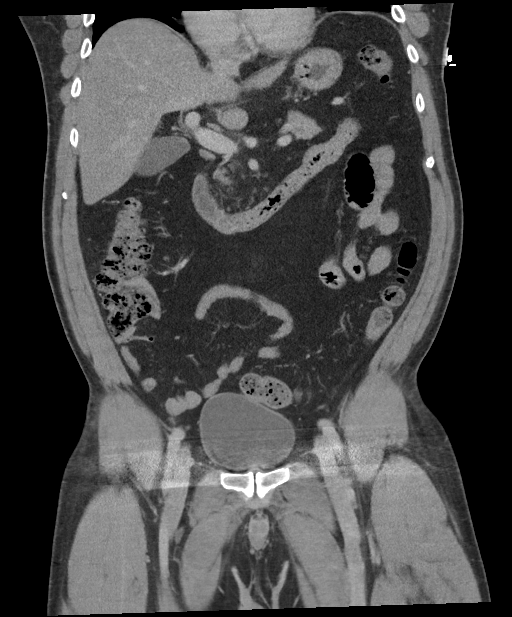
[im 63/114  soft-tissue]
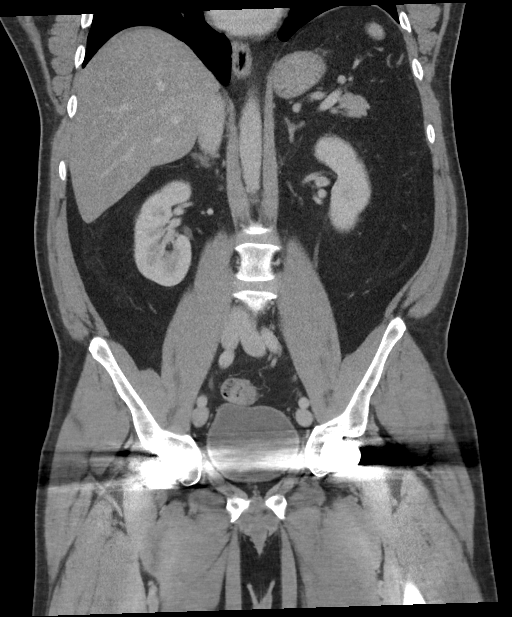

[16 of 46 positions shown; findings below may reference images not displayed]

RADIATION DOSE REDUCTION: This exam was performed according to the
departmental dose-optimization program which includes automated
exposure control, adjustment of the mA and/or kV according to
patient size and/or use of iterative reconstruction technique.

CONTRAST:  100mL OMNIPAQUE IOHEXOL 300 MG/ML  SOLN
FINDINGS: Lower chest: No acute abnormality.

Hepatobiliary: Liver normal in size. Diffuse decreased liver
attenuation consistent with fatty infiltration. No mass or focal
lesion. No gallstones, gallbladder wall thickening, or biliary
dilatation.

Pancreas: Unremarkable. No pancreatic ductal dilatation or
surrounding inflammatory changes.

Spleen: Normal in size without focal abnormality.

Adrenals/Urinary Tract: Adrenal glands are unremarkable. Kidneys are
normal, without renal calculi, focal lesion, or hydronephrosis.
Bladder is unremarkable.

Stomach/Bowel: Normal stomach. Small bowel and colon are normal in
caliber. No wall thickening. No inflammation.

Vascular/Lymphatic: No significant vascular findings are present. No
enlarged abdominal or pelvic lymph nodes.

Reproductive: Unremarkable.

Other: Minimal fat containing umbilical hernia, unchanged from the
prior CT. No bowel enters this. No other hernia. No ascites.

Musculoskeletal: Bilateral total hip arthroplasties appear well
seated and aligned. No fracture or acute finding. No bone lesion.
IMPRESSION: 1. No acute findings within the abdomen or pelvis. No bowel
obstruction or inflammation.
2. Hepatic steatosis.
3. Minimal fat containing umbilical hernia stable from the 5922 CT
scan.

## 2024-05-06 MED ORDER — POLYMYXIN B-TRIMETHOPRIM 10000-0.1 UNIT/ML-% OP SOLN: 2.0000 [drp] | OPHTHALMIC | 0 refills | Status: DC

## 2024-05-06 NOTE — Progress Notes (Signed)
 Subjective: Chief Complaint  Patient presents with   Eye Drainage    Woke up Tuesday am and eye was matted shut and thick yellow mucus. Last night had to use warm washcloth twice and it keeps matting shut. Itchy and painful.     History of Present Illness Scott Moss is a 47 year old male with Graves disease who presents with left eye irritation and discharge.  He has been experiencing left eye irritation and discharge for the past few days, with the eye matted shut upon waking. There is significant yellow mucus discharge and watery eyes. He denies any recent eye injury or exposure to individuals with eye infections. Warm compresses provide some relief.  He uses non-prescription eye drops to flush his eyes, feeling as though something is in the eye, though nothing is present. He experiences irritation and a sensation of a foreign body in the eye, but no severe pain. Vision occasionally becomes blurry, and he rubs his eyes to alleviate the blurriness.  He is currently undergoing infusions for Graves disease, noting improvement in symptoms such as reduced eye bulging and pressure. He is scheduled to see an eye doctor after his next infusion on October 9th. He has had a recent hearing test as part of his treatment monitoring.  No other aggravating or relieving factors. No other complaint.   Past Medical History:  Diagnosis Date   Allergy    Arthritis    Atypical chest pain 07/06/2015   Non-obstructive CAD on [facility] 04/2010.   Chondromalacia of left knee 09/2012   Complication of anesthesia    Family history of cancer    multiple family members, multiple types.   Sees Dr. Timmy   Family history of cancer    Family history of premature CAD    GERD (gastroesophageal reflux disease)    H/O cardiovascular stress test 07/14/2015   myocardial perfusion scan, normal, EF 45-54% mildy reduced LV function, Dr. Raford   H/O echocardiogram 07/18/2015   LV mild hypertrophy, 55-60%EF, Dr.  Raford   High cholesterol    Hypertension    Obesity    Overweight    PONV (postoperative nausea and vomiting)    Testicular cancer (HCC) 02/2005   Dr. Timmy   Current Outpatient Medications on File Prior to Visit  Medication Sig Dispense Refill   acetaminophen  (TYLENOL ) 500 MG tablet Take 500 mg by mouth every 6 (six) hours as needed for mild pain.     apixaban  (ELIQUIS ) 5 MG TABS tablet Take 1 tablet (5 mg total) by mouth 2 (two) times daily. 180 tablet 3   diltiazem  (CARDIZEM  CD) 180 MG 24 hr capsule TAKE 1 CAPSULE(180 MG) BY MOUTH DAILY 90 capsule 3   famotidine  (PEPCID ) 20 MG tablet Take 1 tablet (20 mg total) by mouth daily. 30 tablet 5   fexofenadine (ALLEGRA) 180 MG tablet Take 180 mg by mouth daily.     furosemide  (LASIX ) 20 MG tablet Take 1 tablet (20 mg total) by mouth in the morning. 90 tablet 3   methimazole  (TAPAZOLE ) 5 MG tablet Take 0.5 tablets (2.5 mg total) by mouth daily. 45 tablet 1   rosuvastatin  (CRESTOR ) 10 MG tablet TAKE 1 TABLET(10 MG) BY MOUTH DAILY 90 tablet 1   valsartan  (DIOVAN ) 160 MG tablet Take 1 tablet (160 mg total) by mouth at bedtime. 90 tablet 3   No current facility-administered medications on file prior to visit.    ROS as in subjective    Objective: BP 134/80  Pulse 80   Temp 97.9 F (36.6 C) (Tympanic)   Ht 5' 9 (1.753 m)   Wt 239 lb 6.4 oz (108.6 kg)   BMI 35.35 kg/m   Gen: wd, wn, nad Eyes: left eye conjunctiva with erythema, watery and some mucoid discharge, otherwise PERRLA, EOMI, right eye normal appearing     Assessment and Plan Encounter Diagnosis  Name Primary?   Conjunctivitis of left eye, unspecified conjunctivitis type Yes     Acute conjunctivitis, left eye Bacterial conjunctivitis with yellow discharge and irritation. Discussed contagiousness and hand hygiene. - Prescribe ophthalmic drops every four hours while awake, two drops in the left eye. - Advise hand washing before and after applying drops. -  Instruct to perform warm compresses multiple times a day, especially at night and in the morning. - Advise to flush the eye with shower water in the morning. - Instruct to monitor for improvement within 48 hours; if no improvement, consider adding ointment at night. - Advise to seek immediate care if experiencing severe pain or sudden vision changes. - Discuss the option to stay home for 24 hours if working in close contact with others.  Graves' disease with ophthalmopathy Graves' disease with ophthalmopathy under treatment with infusions. Reports improvement in symptoms. - Follow up with the eye doctor after the next infusion on October 9th. - Monitor for any changes in vision or eye symptoms. -reviewed recent notes from infusion center and endocrinology    Scott Moss was seen today for eye drainage.  Diagnoses and all orders for this visit:  Conjunctivitis of left eye, unspecified conjunctivitis type  Other orders -     trimethoprim-polymyxin b (POLYTRIM) ophthalmic solution; Place 2 drops into the left eye every 4 (four) hours.    F/u prn

## 2024-05-07 DIAGNOSIS — H109 Unspecified conjunctivitis: Secondary | ICD-10-CM

## 2024-05-07 MED ORDER — ERYTHROMYCIN 5 MG/GM OP OINT: 1.0000 | TOPICAL_OINTMENT | Freq: Three times a day (TID) | OPHTHALMIC | 0 refills | Status: AC

## 2024-05-13 DIAGNOSIS — H0520 Unspecified exophthalmos: Secondary | ICD-10-CM | POA: Diagnosis not present

## 2024-05-13 DIAGNOSIS — H02534 Eyelid retraction left upper eyelid: Secondary | ICD-10-CM | POA: Diagnosis not present

## 2024-05-13 DIAGNOSIS — E0500 Thyrotoxicosis with diffuse goiter without thyrotoxic crisis or storm: Secondary | ICD-10-CM | POA: Diagnosis not present

## 2024-05-13 DIAGNOSIS — H0279 Other degenerative disorders of eyelid and periocular area: Secondary | ICD-10-CM | POA: Diagnosis not present

## 2024-05-13 DIAGNOSIS — H11432 Conjunctival hyperemia, left eye: Secondary | ICD-10-CM | POA: Diagnosis not present

## 2024-06-02 ENCOUNTER — Other Ambulatory Visit

## 2024-06-02 DIAGNOSIS — E0500 Thyrotoxicosis with diffuse goiter without thyrotoxic crisis or storm: Secondary | ICD-10-CM | POA: Diagnosis not present

## 2024-06-03 LAB — TSH: TSH: 0.01 m[IU]/L — ABNORMAL LOW (ref 0.40–4.50)

## 2024-06-03 LAB — COMPREHENSIVE METABOLIC PANEL WITH GFR
AG Ratio: 1.8 (calc) (ref 1.0–2.5)
ALT: 65 U/L — ABNORMAL HIGH (ref 9–46)
AST: 42 U/L — ABNORMAL HIGH (ref 10–40)
Albumin: 4.4 g/dL (ref 3.6–5.1)
Alkaline phosphatase (APISO): 76 U/L (ref 36–130)
BUN: 13 mg/dL (ref 7–25)
CO2: 24 mmol/L (ref 20–32)
Calcium: 9.2 mg/dL (ref 8.6–10.3)
Chloride: 105 mmol/L (ref 98–110)
Creat: 0.87 mg/dL (ref 0.60–1.29)
Globulin: 2.4 g/dL (ref 1.9–3.7)
Glucose, Bld: 185 mg/dL — ABNORMAL HIGH (ref 65–99)
Potassium: 4.5 mmol/L (ref 3.5–5.3)
Sodium: 137 mmol/L (ref 135–146)
Total Bilirubin: 0.6 mg/dL (ref 0.2–1.2)
Total Protein: 6.8 g/dL (ref 6.1–8.1)
eGFR: 106 mL/min/1.73m2 (ref >=60)

## 2024-06-03 LAB — T3, FREE: T3, Free: 6.2 pg/mL — ABNORMAL HIGH (ref 2.3–4.2)

## 2024-06-03 LAB — T4, FREE: Free T4: 1.6 ng/dL (ref 0.8–1.8)

## 2024-06-07 ENCOUNTER — Ambulatory Visit: Admitting: "Endocrinology

## 2024-06-07 ENCOUNTER — Encounter: Payer: Self-pay | Admitting: "Endocrinology

## 2024-06-07 VITALS — BP 136/80 | HR 84 | Ht 69.0 in | Wt 234.0 lb

## 2024-06-07 DIAGNOSIS — E0500 Thyrotoxicosis with diffuse goiter without thyrotoxic crisis or storm: Secondary | ICD-10-CM

## 2024-06-07 DIAGNOSIS — R748 Abnormal levels of other serum enzymes: Secondary | ICD-10-CM

## 2024-06-07 MED ORDER — METHIMAZOLE 5 MG PO TABS: 5.0000 mg | ORAL_TABLET | Freq: Every day | ORAL | 1 refills | Status: AC

## 2024-06-07 NOTE — Progress Notes (Signed)
 Outpatient Endocrinology Note Obadiah Birmingham, MD  06/07/24   NEILSON OEHLERT 11-12-1975 [id number]  Referring Provider: Bulah Alm RAMAN, PA-C Primary Care Provider: Bulah Alm RAMAN, PA-C Subjective  No chief complaint on file.   Assessment & Plan  Diagnoses and all orders for this visit:  Graves disease -     methimazole  (TAPAZOLE ) 5 MG tablet; Take 1 tablet (5 mg total) by mouth daily. -     TSH -     T3, free -     T4, free -     Comprehensive metabolic panel with GFR  Abnormal AST and ALT    Sharolyn SHAUNNA Rossman has stopped methimazole  10 mg tid on 12/10/23 when TSH shifted from <0.01 to 62.39. Patient is currently biochemically hyperthyroid but hyperthyroid on MMI 2.5 mg/day. Discussed the etiology for hyperthyroidism. Educated on thyroid  axis.  06/07/24 Recommend the following: started methimazole  5 mg once a day.  Repeat labs sooner if symptoms of hyper or hypothyroidism develop.  -complications of untreated hyperthyroidism include atrial fibrillation, heart failure and osteoporosis -side effects of Methimazole  include but not limited to allergic reaction, rash, bone marrow suppression, liver dysfunction and teratogenic potential -compliance and follow up needs    Left eye proptosis noted since 06/2023 Saw ophthalmologist, on tepezza  with improvement   History of testicular cancer 11/13/23 reviewed thyroid  images and report: no nodules reported   AST/ALT elevated No new medication/dose change Continue monitoring  I have reviewed current medications, nurse's notes, allergies, vital signs, past medical and surgical history, family medical history, and social history for this encounter. Counseled patient on symptoms, examination findings, lab findings, imaging results, treatment decisions and monitoring and prognosis. The patient understood the recommendations and agrees with the treatment plan. All questions regarding treatment plan were fully answered.   Return  in about 6 weeks (around 07/19/2024) for visit + labs before next visit.  Obadiah Birmingham, MD  06/07/24   I have reviewed current medications, nurse's notes, allergies, vital signs, past medical and surgical history, family medical history, and social history for this encounter. Counseled patient on symptoms, examination findings, lab findings, imaging results, treatment decisions and monitoring and prognosis. The patient understood the recommendations and agrees with the treatment plan. All questions regarding treatment plan were fully answered.   History of Present Illness Scott Moss is a 48 y.o. year old male who presents to our clinic with hyperthyroidism diagnosed in 2025.    Left eye became bulgy around 06/2023   Symptoms suggestive of HYPOTHYROIDISM:               fatigue Yes              weight gain No              cold intolerance  No              constipation  NO  Symptoms suggestive of HYPERTHYROIDISM:  weight loss  Yes heat intolerance No hyperdefecation  No palpitations  No  Compressive symptoms:  dysphagia  No, resolved  dysphonia  No positional dyspnea (especially with simultaneous arms elevation)  No  Smokes  No On biotin  No Personal history of head/neck surgery/irradiation  Yes, spinal surgery  Adverse Drug Effects from Methimazole  (MMI): rash No fever No throat pain No arthritis No mouth ulcers No jaundice No loss of appetite No lymphadenopathy No  Grave's Ophthalmopathy Clinical Activity Score: 0/9, but evident Left eye proptosis, saw ophthalmology. On Tepezza   Physical Exam  BP 136/80   Pulse 84   Ht 5' 9 (1.753 m)   Wt 234 lb (106.1 kg)   SpO2 96%   BMI 34.56 kg/m  Constitutional: well developed, well nourished Head: normocephalic, atraumatic, + L eye exophthalmos Eyes: sclera anicteric, no redness Neck: no thyromegaly, no thyroid  tenderness; no nodules palpated Lungs: normal respiratory effort Neurology: alert and oriented, + L  fine hand tremor Skin: dry, no appreciable rashes Musculoskeletal: no appreciable defects Psychiatric: normal mood and affect  Allergies Allergies  Allergen Reactions   Benicar  [Olmesartan ]     Rash, allergic reaction    Current Medications Patient's Medications  New Prescriptions   No medications on file  Previous Medications   ACETAMINOPHEN  (TYLENOL ) 500 MG TABLET    Take 500 mg by mouth every 6 (six) hours as needed for mild pain.   APIXABAN  (ELIQUIS ) 5 MG TABS TABLET    Take 1 tablet (5 mg total) by mouth 2 (two) times daily.   DILTIAZEM  (CARDIZEM  CD) 180 MG 24 HR CAPSULE    TAKE 1 CAPSULE(180 MG) BY MOUTH DAILY   FAMOTIDINE  (PEPCID ) 20 MG TABLET    Take 1 tablet (20 mg total) by mouth daily.   FEXOFENADINE (ALLEGRA) 180 MG TABLET    Take 180 mg by mouth daily.   FUROSEMIDE  (LASIX ) 20 MG TABLET    Take 1 tablet (20 mg total) by mouth in the morning.   ROSUVASTATIN  (CRESTOR ) 10 MG TABLET    TAKE 1 TABLET(10 MG) BY MOUTH DAILY   TRIMETHOPRIM-POLYMYXIN B (POLYTRIM) OPHTHALMIC SOLUTION    Place 2 drops into the left eye every 4 (four) hours.   VALSARTAN  (DIOVAN ) 160 MG TABLET    Take 1 tablet (160 mg total) by mouth at bedtime.  Modified Medications   Modified Medication Previous Medication   METHIMAZOLE  (TAPAZOLE ) 5 MG TABLET methimazole  (TAPAZOLE ) 5 MG tablet      Take 1 tablet (5 mg total) by mouth daily.    Take 0.5 tablets (2.5 mg total) by mouth daily.  Discontinued Medications   No medications on file    Past Medical History Past Medical History:  Diagnosis Date   Allergy    Arthritis    Atypical chest pain 07/06/2015   Non-obstructive CAD on [facility] 04/2010.   Chondromalacia of left knee 09/2012   Complication of anesthesia    Family history of cancer    multiple family members, multiple types.   Sees Dr. Timmy   Family history of cancer    Family history of premature CAD    GERD (gastroesophageal reflux disease)    H/O cardiovascular stress test 07/14/2015    myocardial perfusion scan, normal, EF 45-54% mildy reduced LV function, Dr. Raford   H/O echocardiogram 07/18/2015   LV mild hypertrophy, 55-60%EF, Dr. Raford   High cholesterol    Hypertension    Obesity    Overweight    PONV (postoperative nausea and vomiting)    Testicular cancer (HCC) 02/2005   Dr. Timmy    Past Surgical History Past Surgical History:  Procedure Laterality Date   ANTERIOR CERVICAL DECOMP/DISCECTOMY FUSION  11/11/2008   C5-6   APPENDECTOMY     CARDIAC CATHETERIZATION  04/13/2010   no disease   CERVICAL SPINE SURGERY  08/05/2013   Dr. Victory Boers   COLONOSCOPY     prior, no problems per patient, year unknown, possibly in early 30s.   HIP ARTHROPLASTY Left 03/2020   HIP ARTHROPLASTY Right 04/2020  KNEE ARTHROSCOPY Right    KNEE ARTHROSCOPY Left 10/19/2015   Procedure: LEFT KNEE SCOPE CHONDROPLASTY;  Surgeon: Toribio JULIANNA Chancy, MD;  Location: [facility];  Service: Orthopedics;  Laterality: Left;   KNEE ARTHROSCOPY WITH LATERAL RELEASE Left 09/18/2012   Procedure: KNEE ARTHROSCOPY WITH LATERAL RELEASE;  Surgeon: Toribio JULIANNA Chancy, MD;  Location: [facility];  Service: Orthopedics;  Laterality: Left;  WITH DEBRIDEMENT AND SHAVING(CHONDROPLASTY), Excision Medial [place]   NASAL SINUS SURGERY  08/05/2012   PARTIAL KNEE ARTHROPLASTY Left 08/15/2016   Procedure: PATELLA FEMORAL REPLACEMENT LEFT KNEE;  Surgeon: Toribio JULIANNA Chancy, MD;  Location: [facility];  Service: Orthopedics;  Laterality: Left;   RADICAL ORCHIECTOMY  02/07/2005   left; with left testicular implant    Family History family history includes Cancer in his father and mother; Cancer (age of onset: 35) in his sister; Colon cancer in his maternal uncle; Diabetes in his maternal uncle; Esophageal cancer in his maternal uncle; Heart attack in his maternal uncle and paternal uncle; Heart attack (age of onset: 76) in his father; Heart disease in his father;  Hypertension in his father, mother, and sister; Lung cancer in his father; Migraines in his mother and sister; Ovarian cancer in his mother; Skin cancer in his father; Stroke in his mother.  Social History Social History   Socioeconomic History   Marital status: Married    Spouse name: Not on file   Number of children: 2   Years of education: Not on file   Highest education level: Not on file  Occupational History   Occupation: truck driver  Tobacco Use   Smoking status: Never    Passive exposure: Never   Smokeless tobacco: Never   Tobacco comments:    never used tobacco  Vaping Use   Vaping status: Never Used  Substance and Sexual Activity   Alcohol use: Yes    Alcohol/week: 3.0 standard drinks of alcohol    Types: 3 Cans of beer per week    Comment: social   Drug use: No   Sexual activity: Yes  Other Topics Concern   Not on file  Social History Narrative   Married, 2 children, is a naval architect for [employer].   08/2022      Social Drivers of Health   Financial Resource Strain: Not on file  Food Insecurity: Not on file  Transportation Needs: Not on file  Physical Activity: Not on file  Stress: Not on file  Social Connections: Not on file  Intimate Partner Violence: Not on file    Laboratory Investigations Lab Results  Component Value Date   TSH 0.01 (L) 06/02/2024   TSH 2.41 01/14/2024   TSH 4.25 12/18/2023   FREET4 1.6 06/02/2024   FREET4 1.0 01/14/2024   FREET4 1.0 12/18/2023     Lab Results  Component Value Date   TSI 282 (H) 08/27/2023     No components found for: TRAB   Lab Results  Component Value Date   CHOL 115 08/20/2023   Lab Results  Component Value Date   HDL 45 08/20/2023   Lab Results  Component Value Date   LDLCALC 58 08/20/2023   Lab Results  Component Value Date   TRIG 54 08/20/2023   Lab Results  Component Value Date   CHOLHDL 2.6 08/20/2023   Lab Results  Component Value Date   CREATININE 0.87 06/02/2024   Lab  Results  Component Value Date   GFR 94.61 01/06/2023  Component Value Date/Time   NA 137 06/02/2024 0801   NA 143 09/11/2023 1526   K 4.5 06/02/2024 0801   CL 105 06/02/2024 0801   CO2 24 06/02/2024 0801   GLUCOSE 185 (H) 06/02/2024 0801   BUN 13 06/02/2024 0801   BUN 8 09/11/2023 1526   CREATININE 0.87 06/02/2024 0801   CALCIUM  9.2 06/02/2024 0801   PROT 6.8 06/02/2024 0801   PROT 6.8 08/20/2023 1107   ALBUMIN 4.1 08/20/2023 1107   AST 42 (H) 06/02/2024 0801   ALT 65 (H) 06/02/2024 0801   ALKPHOS 135 (H) 08/20/2023 1107   BILITOT 0.6 06/02/2024 0801   BILITOT 0.8 08/20/2023 1107   GFRNONAA >60 12/17/2022 1855   GFRAA 106 12/16/2019 1106      Latest Ref Rng & Units 06/02/2024    8:01 AM 09/11/2023    3:26 PM 09/05/2023    5:17 PM  BMP  Glucose 65 - 99 mg/dL 814  86  81   BUN 7 - 25 mg/dL 13  8  13    Creatinine 0.60 - 1.29 mg/dL 9.12  9.10  9.06   BUN/Creat Ratio 6 - 22 (calc) SEE NOTE:  9  14   Sodium 135 - 146 mmol/L 137  143  142   Potassium 3.5 - 5.3 mmol/L 4.5  4.9  4.8   Chloride 98 - 110 mmol/L 105  105  105   CO2 20 - 32 mmol/L 24  25  20    Calcium  8.6 - 10.3 mg/dL 9.2  9.2  9.7        Component Value Date/Time   WBC 5.9 08/18/2023 1341   WBC 5.9 01/06/2023 0903   RBC 5.22 08/18/2023 1341   RBC 5.21 01/06/2023 0903   HGB 15.5 09/05/2023 1714   HGB 16.4 06/20/2014 1026   HGB 16.4 06/04/2007 0755   HCT 46.2 09/05/2023 1714   HCT 47.1 06/20/2014 1026   HCT 46.3 06/04/2007 0755   PLT 331 08/18/2023 1341   MCV 86 08/18/2023 1341   MCV 87 06/20/2014 1026   MCV 88.2 06/04/2007 0755   MCH 28.7 08/18/2023 1341   MCH 30.3 12/17/2022 1855   MCHC 33.4 08/18/2023 1341   MCHC 32.9 01/06/2023 0903   RDW 12.2 08/18/2023 1341   RDW 11.9 06/20/2014 1026   RDW 13.0 06/04/2007 0755   LYMPHSABS 1.7 08/18/2023 1341   LYMPHSABS 2.1 06/20/2014 1026   LYMPHSABS 1.4 06/04/2007 0755   MONOABS 0.6 01/06/2023 0903   MONOABS 0.4 06/04/2007 0755   EOSABS 0.1 08/18/2023  1341   EOSABS 0.2 06/20/2014 1026   BASOSABS 0.0 08/18/2023 1341   BASOSABS 0.1 06/20/2014 1026   BASOSABS 0.0 06/04/2007 0755      Parts of this note may have been dictated using voice recognition software. There may be variances in spelling and vocabulary which are unintentional. Not all errors are proofread. Please notify the dino if any discrepancies are noted or if the meaning of any statement is not clear.

## 2024-06-10 DIAGNOSIS — E0500 Thyrotoxicosis with diffuse goiter without thyrotoxic crisis or storm: Secondary | ICD-10-CM | POA: Diagnosis not present

## 2024-07-07 DIAGNOSIS — Z09 Encounter for follow-up examination after completed treatment for conditions other than malignant neoplasm: Secondary | ICD-10-CM | POA: Diagnosis not present

## 2024-07-07 DIAGNOSIS — I480 Paroxysmal atrial fibrillation: Secondary | ICD-10-CM | POA: Diagnosis not present

## 2024-07-07 DIAGNOSIS — H02534 Eyelid retraction left upper eyelid: Secondary | ICD-10-CM | POA: Diagnosis not present

## 2024-07-07 DIAGNOSIS — E0500 Thyrotoxicosis with diffuse goiter without thyrotoxic crisis or storm: Secondary | ICD-10-CM | POA: Diagnosis not present

## 2024-07-07 DIAGNOSIS — H0520 Unspecified exophthalmos: Secondary | ICD-10-CM | POA: Diagnosis not present

## 2024-07-07 LAB — BASIC METABOLIC PANEL WITH GFR
BUN/Creatinine Ratio: 15 (ref 9–20)
BUN: 12 mg/dL (ref 6–24)
CO2: 19 mmol/L — ABNORMAL LOW (ref 20–29)
Calcium: 8.8 mg/dL (ref 8.7–10.2)
Chloride: 105 mmol/L (ref 96–106)
Creatinine, Ser: 0.82 mg/dL (ref 0.76–1.27)
Glucose: 136 mg/dL — ABNORMAL HIGH (ref 70–99)
Potassium: 4.4 mmol/L (ref 3.5–5.2)
Sodium: 138 mmol/L (ref 134–144)
eGFR: 107 mL/min/1.73 (ref >59)

## 2024-07-07 LAB — HEMOGLOBIN AND HEMATOCRIT, BLOOD
Hematocrit: 45.6 % (ref 37.5–51.0)
Hemoglobin: 15 g/dL (ref 13.0–17.7)

## 2024-07-08 ENCOUNTER — Ambulatory Visit: Payer: Self-pay | Admitting: Cardiology

## 2024-07-16 ENCOUNTER — Encounter: Payer: Self-pay | Admitting: Cardiology

## 2024-07-16 ENCOUNTER — Other Ambulatory Visit

## 2024-07-16 ENCOUNTER — Ambulatory Visit: Attending: Cardiology | Admitting: Cardiology

## 2024-07-16 VITALS — BP 150/70 | HR 74 | Ht 69.0 in | Wt 232.0 lb

## 2024-07-16 DIAGNOSIS — Z8249 Family history of ischemic heart disease and other diseases of the circulatory system: Secondary | ICD-10-CM | POA: Diagnosis not present

## 2024-07-16 DIAGNOSIS — E782 Mixed hyperlipidemia: Secondary | ICD-10-CM | POA: Diagnosis not present

## 2024-07-16 DIAGNOSIS — E0500 Thyrotoxicosis with diffuse goiter without thyrotoxic crisis or storm: Secondary | ICD-10-CM | POA: Diagnosis not present

## 2024-07-16 DIAGNOSIS — I700 Atherosclerosis of aorta: Secondary | ICD-10-CM

## 2024-07-16 DIAGNOSIS — I480 Paroxysmal atrial fibrillation: Secondary | ICD-10-CM

## 2024-07-16 DIAGNOSIS — E079 Disorder of thyroid, unspecified: Secondary | ICD-10-CM | POA: Diagnosis not present

## 2024-07-16 DIAGNOSIS — I4891 Unspecified atrial fibrillation: Secondary | ICD-10-CM

## 2024-07-16 DIAGNOSIS — I10 Essential (primary) hypertension: Secondary | ICD-10-CM | POA: Diagnosis not present

## 2024-07-16 DIAGNOSIS — E0590 Thyrotoxicosis, unspecified without thyrotoxic crisis or storm: Secondary | ICD-10-CM | POA: Diagnosis not present

## 2024-07-16 MED ORDER — APIXABAN 5 MG PO TABS: 5.0000 mg | ORAL_TABLET | Freq: Two times a day (BID) | ORAL | 3 refills | Status: DC

## 2024-07-16 NOTE — Patient Instructions (Signed)
 Medication Instructions:  Your physician recommends that you continue on your current medications as directed. Please refer to the Current Medication list given to you today.  *If you need a refill on your cardiac medications before your next appointment, please call your pharmacy*  Lab Work: BMET, Hemoglobin and Hematocrit-Before next visit If you have labs (blood work) drawn today and your tests are completely normal, you will receive your results only by: MyChart Message (if you have MyChart) OR A paper copy in the mail If you have any lab test that is abnormal or we need to change your treatment, we will call you to review the results.  Testing/Procedures: None ordered  Follow-Up: At [facility] [place], you and your health needs are our priority.  As part of our continuing mission to provide you with exceptional heart care, our providers are all part of one team.  This team includes your primary Cardiologist (physician) and Advanced Practice Providers or APPs (Physician Assistants and Nurse Practitioners) who all work together to provide you with the care you need, when you need it.  Your next appointment:   6 month(s)  Provider:   Madonna Large, DO    We recommend signing up for the patient portal called MyChart.  Sign up information is provided on this After Visit Summary.  MyChart is used to connect with patients for Virtual Visits (Telemedicine).  Patients are able to view lab/test results, encounter notes, upcoming appointments, etc.  Non-urgent messages can be sent to your provider as well.   To learn more about what you can do with MyChart, go to [email or web address].   Other Instructions We are referring you to Electrophysiology for Watchman Evaluation with Dr. Almetta.

## 2024-07-16 NOTE — Progress Notes (Signed)
 Cardiology Office Note:  .   ID:  Scott Moss, DOB 07-Aug-1975, MRN [id number] PCP:  [name]  Former Cardiology Providers: N/A [facility] Providers Cardiologist:  Madonna Large, DO , [facility] (established care 09/05/23) Electrophysiologist:  None  Click to update primary MD,subspecialty MD or APP then REFRESH:1}    Chief Complaint  Patient presents with   Follow-up    Atrial fibrillation    History of Present Illness: .   Scott Moss is a 48 y.o. Caucasian male whose past medical history and cardiovascular risk factors includes: Premature coronary artery disease family, hypertension, hyperlipidemia, hyperthyroidism aortic atherosclerosis.  Patient is referred to the practice for management of atrial fibrillation, new onset in the setting of hyperthyroidism.  Patient was started on methimazole  and being followed by endocrinology and I had transitioned him from diltiazem  to propranolol .  Patient was started on diuretics due to physical examination findings of volume overload and patient endorsing shortness of breath.  Echocardiogram noted preserved LVEF.  Shared decision was to treat his underlying thyroid  disease and if he remains in A-fib we will consider direct-current cardioversion or antiarrhythmic medications.  Once the thyroid  disease was addressed patient restored sinus rhythm.  Since establishing care patient has reduced alcohol consumption significantly.  He was drinking 12 pack over 1.5 weeks.  He was congratulated on his efforts.  Beta-blockers were discontinued due to 10 pound weight gain and sleeping difficulties.  He was transition to Cardizem  for rate control strategy.  Patient was also informed at last office visit to consider sleep apnea evaluation.  Patient presents today for 26-month follow-up visit.  Since last office visit Mr. [name] denies any anginal chest pain. No hospitalizations or urgent care visits for cardiovascular reasons.   He  has been compliant with his medical therapy, endorses no issues.    He has been shortness of breath at times but no HF symptoms and less since he establishing care.  In October his TSH was very low. He has had issues controlling thyroid  function and has an appt on 07/21/2024 Physical endurance remains stable.  Home SBP ranges between 135-146mmHg.  Dad had MI at age of 14 which required coronary interventions.   Review of Systems: .   Review of Systems  Cardiovascular:  Positive for dyspnea on exertion. Negative for chest pain, claudication, irregular heartbeat, leg swelling, near-syncope, orthopnea, palpitations, paroxysmal nocturnal dyspnea and syncope.  Respiratory:  Negative for shortness of breath.   Hematologic/Lymphatic: Negative for bleeding problem.    Studies Reviewed:    Echocardiogram: 07/18/2015: LVEF 55-60%, mild LVH, normal diastolic parameters, no significant valvular heart disease.  09/24/2023:  1. Left ventricular ejection fraction, by estimation, is 55 to 60%. The left ventricle has normal function. The left ventricle has no regional wall motion abnormalities. There is mild left ventricular hypertrophy. Left ventricular diastolic parameters  are indeterminate.  2. Right ventricular systolic function is normal. The right ventricular size is normal. Tricuspid regurgitation signal is inadequate for assessing PA pressure.  3. Left atrial size was mildly dilated.  4. The mitral valve is normal in structure. Trivial mitral valve regurgitation. No evidence of mitral stenosis.  5. The aortic valve is grossly normal. Aortic valve regurgitation is not visualized. No aortic stenosis is present.  6. The inferior vena cava is normal in size with greater than 50% respiratory variability, suggesting right atrial pressure of 3 mmHg.   Stress Testing: 07/2015: The left ventricular ejection fraction is mildly decreased (  45-54%). Nuclear stress EF: 51%. There was no ST segment  deviation noted during stress The study is normal. This is a low risk study.  RADIOLOGY: NA  Risk Assessment/Calculations:   Click Here to Calculate/Change CHADS2VASc Score The patient's CHADS2-VASc score is 2, indicating a 2.2% annual risk of stroke.    Labs:       Latest Ref Rng & Units 07/07/2024   10:54 AM 09/05/2023    5:14 PM 08/18/2023    1:41 PM  CBC  WBC 3.4 - 10.8 x10E3/uL   5.9   Hemoglobin 13.0 - 17.7 g/dL 84.9  84.4  84.9   Hematocrit 37.5 - 51.0 % 45.6  46.2  44.9   Platelets 150 - 450 x10E3/uL   331        Latest Ref Rng & Units 07/16/2024    9:08 AM 07/07/2024   10:54 AM 06/02/2024    8:01 AM  BMP  Glucose 65 - 99 mg/dL 842  863  814   BUN 7 - 25 mg/dL 16  12  13    Creatinine 0.70 - 1.30 mg/dL 9.10  9.17  9.12   BUN/Creat Ratio 6 - 22 (calc) SEE NOTE:  15  SEE NOTE:   Sodium 135 - 146 mmol/L 138  138  137   Potassium 3.5 - 5.3 mmol/L 4.1  4.4  4.5   Chloride 98 - 110 mmol/L 106  105  105   CO2 20 - 32 mmol/L 25  19  24    Calcium  8.6 - 10.3 mg/dL 9.0  8.8  9.2       Latest Ref Rng & Units 07/16/2024    9:08 AM 07/07/2024   10:54 AM 06/02/2024    8:01 AM  CMP  Glucose 65 - 99 mg/dL 842  863  814   BUN 7 - 25 mg/dL 16  12  13    Creatinine 0.70 - 1.30 mg/dL 9.10  9.17  9.12   Sodium 135 - 146 mmol/L 138  138  137   Potassium 3.5 - 5.3 mmol/L 4.1  4.4  4.5   Chloride 98 - 110 mmol/L 106  105  105   CO2 20 - 32 mmol/L 25  19  24    Calcium  8.6 - 10.3 mg/dL 9.0  8.8  9.2   Total Protein 6.1 - 8.1 g/dL 6.7   6.8   Total Bilirubin 0.2 - 1.2 mg/dL 0.5   0.6   AST 10 - 35 U/L 34   42   ALT 9 - 46 U/L 46   65     Lab Results  Component Value Date   CHOL 115 08/20/2023   HDL 45 08/20/2023   LDLCALC 58 08/20/2023   TRIG 54 08/20/2023   CHOLHDL 2.6 08/20/2023   No results for input(s): LIPOA in the last 8760 hours. No components found for: NTPROBNP Recent Labs    09/05/23 1714 09/11/23 1526  PROBNP 1,234* 605*   Recent Labs     01/14/24 0902 06/02/24 0801 07/16/24 0908  TSH 2.41 0.01* <0.01*     Physical Exam:    Today's Vitals   07/16/24 0808  BP: (!) 150/70  Pulse: 74  SpO2: 95%  Weight: 232 lb (105.2 kg)  Height: 5' 9 (1.753 m)   Body mass index is 34.26 kg/m. Wt Readings from Last 3 Encounters:  07/16/24 232 lb (105.2 kg)  06/07/24 234 lb (106.1 kg)  05/06/24 239 lb 6.4 oz (108.6 kg)    Physical  Exam  Constitutional: No distress.  hemodynamically stable  Neck: No JVD present.  Cardiovascular: Normal rate, regular rhythm, S1 normal and S2 normal. Exam reveals no gallop, no S3 and no S4.  No murmur heard. Pulmonary/Chest: Effort normal and breath sounds normal. No stridor. He has no wheezes. He has no rales.  Musculoskeletal:        General: No edema.     Cervical back: Neck supple.  Skin: Skin is warm.     Impression & Recommendation(s):  Impression:   ICD-10-CM   1. Paroxysmal atrial fibrillation (HCC)  I48.0 Hemoglobin and hematocrit, blood    Basic Metabolic Panel (BMET)    Ambulatory referral to Cardiac Electrophysiology    apixaban  (ELIQUIS ) 5 MG TABS tablet    Basic Metabolic Panel (BMET)    Hemoglobin and hematocrit, blood    2. Benign hypertension  I10     3. Mixed hyperlipidemia  E78.2     4. Thyroid  disease  E07.9     5. Atherosclerosis of aorta  I70.0     6. Family history of premature CAD  Z82.49        Recommendation(s):  Paroxysmal atrial fibrillation [facility]) Was referred to the practice in January 2025 for new onset of atrial fibrillation. Likely brought on by underlying thyroid  disease, hyperthyroidism. Patient was placed on anticoagulation given his CHA2DS2-VASc score. Remains on Eliquis  5 mg p.o. twice daily. Does not endorse evidence of bleeding. Has significantly cut down his alcohol consumption as mentioned above as well. We discussed long-term anticoagulation given his CHA2DS2-VASc score and his propensity of possible A-fib burden given his  underlying thyroid  disease.  Patient would like to continue with anticoagulation at this time. The alternative that we have discussed in the past is ILR for Afib monitoring and if it recurrs to restart oral anticoagulation. However, since his thyroid  function still remains an issue patient and wife suspect he remains at risk of Afib and will continue current treatment plan.  Blood work from October 2025 notes TSH concerning for reoccurrence of hyperthyroidism. Beta-blockers discontinued in the past due to weight gain and sleep difficulties. Continue Cardizem  at the current dose. 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 Reemphasized the importance of being screened for sleep apnea Congratulated him on reduction in alcohol consumption Has an appointment to see endocrinology on 07/21/2024 for thyroid  disease Recent labs from 11/06/2023 notes stable renal and hemoglobin values. Refill Eliquis . Will repeat labs in 6 months prior to next office visit to monitor H&H and renal function  Benign hypertension Office blood pressures are not well-controlled. Home SBP range is acceptable but still not at goal. Recommended increasing valsartan , patient and wife would like to hold off for now and will continue to check blood pressures at home Continue valsartan  160 mg p.o. every afternoon. Continue furosemide  20 mg p.o. every morning. Continue Cardizem  CD 180 mg p.o. daily  Mixed hyperlipidemia Currently on Crestor  10 mg p.o. daily.   He denies myalgia or other side effects. Most recent lipids dated August 20, 2023, independently reviewed as noted above.  LDL is 58 mg/dL.  Cardiology is following peripherally.  Thyroid  disease Initially hyperthyroid, then hypothyroid, then euthyroid, now recurrence of hyperthyroidism Remains on methimazole . Follows up  with endocrinology   Orders Placed:  Orders Placed This Encounter  Procedures   Hemoglobin and hematocrit, blood  Standing Status:   Future    Number of Occurrences:   1    Expected Date:   12/03/2024    Expiration Date:   07/16/2025   Basic Metabolic Panel (BMET)    Standing Status:   Future    Number of Occurrences:   1    Expected Date:   12/03/2024    Expiration Date:   07/16/2025   Ambulatory referral to Cardiac Electrophysiology    Referral Priority:   Routine    Referral Type:   Consultation    Referral Reason:   Specialty Services Required    Referred to Provider:   Almetta Donnice LABOR, MD    Requested Specialty:   Cardiology    Number of Visits Requested:   1   Final Medication List:    Meds ordered this encounter  Medications   apixaban  (ELIQUIS ) 5 MG TABS tablet    Sig: Take 1 tablet (5 mg total) by mouth 2 (two) times daily.    Dispense:  180 tablet    Refill:  3    Medications Discontinued During This Encounter  Medication Reason   trimethoprim -polymyxin b  (POLYTRIM ) ophthalmic solution Patient Discharge   apixaban  (ELIQUIS ) 5 MG TABS tablet Reorder       Current Outpatient Medications:    acetaminophen  (TYLENOL ) 500 MG tablet, Take 500 mg by mouth every 6 (six) hours as needed for mild pain., Disp: , Rfl:    diltiazem  (CARDIZEM  CD) 180 MG 24 hr capsule, TAKE 1 CAPSULE(180 MG) BY MOUTH DAILY, Disp: 90 capsule, Rfl: 3   famotidine  (PEPCID ) 20 MG tablet, Take 1 tablet (20 mg total) by mouth daily., Disp: 30 tablet, Rfl: 5   fexofenadine (ALLEGRA) 180 MG tablet, Take 180 mg by mouth daily., Disp: , Rfl:    furosemide  (LASIX ) 20 MG tablet, Take 1 tablet (20 mg total) by mouth in the morning., Disp: 90 tablet, Rfl: 3   methimazole  (TAPAZOLE ) 5 MG tablet, Take 1 tablet (5 mg total) by mouth daily., Disp: 90 tablet, Rfl: 1   rosuvastatin  (CRESTOR ) 10 MG tablet, TAKE 1 TABLET(10 MG) BY MOUTH DAILY, Disp: 90 tablet, Rfl: 1   valsartan  (DIOVAN ) 160 MG tablet, Take 1  tablet (160 mg total) by mouth at bedtime., Disp: 90 tablet, Rfl: 3   apixaban  (ELIQUIS ) 5 MG TABS tablet, Take 1 tablet (5 mg total) by mouth 2 (two) times daily., Disp: 180 tablet, Rfl: 3  Consent:   NA  Disposition:   100-month follow-up sooner if needed  His questions and concerns were addressed to his satisfaction. He voices understanding of the recommendations provided during this encounter.    Signed, Madonna Michele HAS, [facility]  A Division of [facility] [address], [place], [place] [address]

## 2024-07-17 ENCOUNTER — Encounter: Payer: Self-pay | Admitting: Cardiology

## 2024-07-17 LAB — COMPREHENSIVE METABOLIC PANEL WITH GFR
AG Ratio: 1.7 (calc) (ref 1.0–2.5)
ALT: 46 U/L (ref 9–46)
AST: 34 U/L (ref 10–35)
Albumin: 4.2 g/dL (ref 3.6–5.1)
Alkaline phosphatase (APISO): 84 U/L (ref 35–144)
BUN: 16 mg/dL (ref 7–25)
CO2: 25 mmol/L (ref 20–32)
Calcium: 9 mg/dL (ref 8.6–10.3)
Chloride: 106 mmol/L (ref 98–110)
Creat: 0.89 mg/dL (ref 0.70–1.30)
Globulin: 2.5 g/dL (ref 1.9–3.7)
Glucose, Bld: 157 mg/dL — ABNORMAL HIGH (ref 65–99)
Potassium: 4.1 mmol/L (ref 3.5–5.3)
Sodium: 138 mmol/L (ref 135–146)
Total Bilirubin: 0.5 mg/dL (ref 0.2–1.2)
Total Protein: 6.7 g/dL (ref 6.1–8.1)
eGFR: 104 mL/min/1.73m2 (ref >=60)

## 2024-07-17 LAB — T3, FREE: T3, Free: 5.5 pg/mL — ABNORMAL HIGH (ref 2.3–4.2)

## 2024-07-17 LAB — T4, FREE: Free T4: 1.3 ng/dL (ref 0.8–1.8)

## 2024-07-17 LAB — TSH: TSH: <0.01 m[IU]/L — ABNORMAL LOW (ref 0.40–4.50)

## 2024-07-19 ENCOUNTER — Telehealth: Payer: Self-pay

## 2024-07-19 NOTE — Telephone Encounter (Signed)
 Received Watchman referral from Dr. Michele. Spoke with patient and arranged consult with Dr. Almetta for 08/25/2024. Patient will call with questions/concerns. He was grateful for the call.

## 2024-07-21 ENCOUNTER — Encounter: Payer: Self-pay | Admitting: "Endocrinology

## 2024-07-21 ENCOUNTER — Ambulatory Visit: Admitting: "Endocrinology

## 2024-07-21 VITALS — BP 120/80 | HR 86 | Ht 69.0 in | Wt 238.0 lb

## 2024-07-21 DIAGNOSIS — E0500 Thyrotoxicosis with diffuse goiter without thyrotoxic crisis or storm: Secondary | ICD-10-CM | POA: Diagnosis not present

## 2024-07-21 DIAGNOSIS — H0520 Unspecified exophthalmos: Secondary | ICD-10-CM | POA: Diagnosis not present

## 2024-07-21 NOTE — Progress Notes (Signed)
 Outpatient Endocrinology Note Scott Birmingham, MD  07/21/2024   [name] 1976-02-16 [id number]  Referring Provider: Bulah Alm RAMAN, PA-C Primary Care Provider: Bulah Alm RAMAN, PA-C Subjective  No chief complaint on file.   Assessment & Plan  Diagnoses and all orders for this visit:  Graves disease -     TSH -     T4, free -     T3, free -     TRAb (TSH Receptor Binding Antibody) -     Thyroid  stimulating immunoglobulin  Proptosis   Scott Moss has stopped methimazole  10 mg tid on 12/10/23 when TSH shifted from <0.01 to 62.39. On methimazole  5 mg once a day.  Patient is currently biochemically hyperthyroid. Discussed the etiology for hyperthyroidism. Educated on thyroid  axis.  07/21/2024 Recommend the following: start methimazole  10 mg once a day for 1 week followed by 7.5 mg weekly. Clear with ophthalmologist about safety concers before proceeding with radioactive iodine. Discussed about risks and side effects, patient wants RAI. Discussed about surgery vs  Repeat labs sooner if symptoms of hyper or hypothyroidism develop.  -complications of untreated hyperthyroidism include atrial fibrillation, heart failure and osteoporosis -side effects of Methimazole  include but not limited to allergic reaction, rash, bone marrow suppression, liver dysfunction and teratogenic potential -compliance and follow up needs    Left eye proptosis noted since 06/2023 Saw ophthalmologist, finished tepezza  treatment   History of testicular cancer 11/13/23 reviewed thyroid  images and report: no nodules reported   AST/ALT previously elevated; 07/16/24: now normalized  No new medication/dose change Continue monitoring  I have reviewed current medications, nurse's notes, allergies, vital signs, past medical and surgical history, family medical history, and social history for this encounter. Counseled patient on symptoms, examination findings, lab findings, imaging results,  treatment decisions and monitoring and prognosis. The patient understood the recommendations and agrees with the treatment plan. All questions regarding treatment plan were fully answered.   Return in about 6 weeks (around 09/01/2024) for visit + labs before next visit.  Scott Birmingham, MD  07/21/2024   I have reviewed current medications, nurse's notes, allergies, vital signs, past medical and surgical history, family medical history, and social history for this encounter. Counseled patient on symptoms, examination findings, lab findings, imaging results, treatment decisions and monitoring and prognosis. The patient understood the recommendations and agrees with the treatment plan. All questions regarding treatment plan were fully answered.   History of Present Illness Scott Moss is a 48 y.o. year old male who presents to our clinic with hyperthyroidism diagnosed in 2025.    Left eye became bulgy around 06/2023   Symptoms suggestive of HYPOTHYROIDISM:               fatigue Yes              weight gain No              cold intolerance  No              constipation  No  Symptoms suggestive of HYPERTHYROIDISM:  weight loss  No heat intolerance No hyperdefecation  No palpitations  No  Compressive symptoms:  dysphagia  No, resolved  dysphonia  Yes positional dyspnea (especially with simultaneous arms elevation)  No  Smokes  No On biotin  No Personal history of head/neck surgery/irradiation  Yes, spinal surgery  Adverse Drug Effects from Methimazole  (MMI): rash No fever No throat pain No arthritis No mouth ulcers No jaundice  No loss of appetite No lymphadenopathy No  Grave's Ophthalmopathy Clinical Activity Score: 0/9, but evident Left eye proptosis, saw ophthalmology. On Tepezza    Physical Exam  BP 120/80   Pulse 86   Ht 5' 9 (1.753 m)   Wt 238 lb (108 kg)   SpO2 97%   BMI 35.15 kg/m  Constitutional: well developed, well nourished Head: normocephalic,  atraumatic, + L eye exophthalmos Eyes: sclera anicteric, no redness Neck: no thyromegaly, no thyroid  tenderness; no nodules palpated Lungs: normal respiratory effort Neurology: alert and oriented, + L fine hand tremor Skin: dry, no appreciable rashes Musculoskeletal: no appreciable defects Psychiatric: normal mood and affect  Allergies Allergies  Allergen Reactions   Benicar  [Olmesartan ]     Rash, allergic reaction    Current Medications Patient's Medications  New Prescriptions   No medications on file  Previous Medications   ACETAMINOPHEN  (TYLENOL ) 500 MG TABLET    Take 500 mg by mouth every 6 (six) hours as needed for mild pain.   APIXABAN  (ELIQUIS ) 5 MG TABS TABLET    Take 1 tablet (5 mg total) by mouth 2 (two) times daily.   DILTIAZEM  (CARDIZEM  CD) 180 MG 24 HR CAPSULE    TAKE 1 CAPSULE(180 MG) BY MOUTH DAILY   FAMOTIDINE  (PEPCID ) 20 MG TABLET    Take 1 tablet (20 mg total) by mouth daily.   FEXOFENADINE (ALLEGRA) 180 MG TABLET    Take 180 mg by mouth daily.   FUROSEMIDE  (LASIX ) 20 MG TABLET    Take 1 tablet (20 mg total) by mouth in the morning.   METHIMAZOLE  (TAPAZOLE ) 5 MG TABLET    Take 1 tablet (5 mg total) by mouth daily.   ROSUVASTATIN  (CRESTOR ) 10 MG TABLET    TAKE 1 TABLET(10 MG) BY MOUTH DAILY   VALSARTAN  (DIOVAN ) 160 MG TABLET    Take 1 tablet (160 mg total) by mouth at bedtime.  Modified Medications   No medications on file  Discontinued Medications   No medications on file    Past Medical History Past Medical History:  Diagnosis Date   Allergy    Arthritis    Atypical chest pain 07/06/2015   Non-obstructive CAD on [facility] 04/2010.   Chondromalacia of left knee 09/2012   Complication of anesthesia    Family history of cancer    multiple family members, multiple types.   Sees Dr. Timmy   Family history of cancer    Family history of premature CAD    GERD (gastroesophageal reflux disease)    H/O cardiovascular stress test 07/14/2015   myocardial perfusion  scan, normal, EF 45-54% mildy reduced LV function, Dr. Raford   H/O echocardiogram 07/18/2015   LV mild hypertrophy, 55-60%EF, Dr. Raford   High cholesterol    Hypertension    Obesity    Overweight    PONV (postoperative nausea and vomiting)    Testicular cancer (HCC) 02/2005   Dr. Timmy    Past Surgical History Past Surgical History:  Procedure Laterality Date   ANTERIOR CERVICAL DECOMP/DISCECTOMY FUSION  11/11/2008   C5-6   APPENDECTOMY     CARDIAC CATHETERIZATION  04/13/2010   no disease   CERVICAL SPINE SURGERY  08/05/2013   Dr. Victory Boers   COLONOSCOPY     prior, no problems per patient, year unknown, possibly in early 30s.   HIP ARTHROPLASTY Left 03/2020   HIP ARTHROPLASTY Right 04/2020   KNEE ARTHROSCOPY Right    KNEE ARTHROSCOPY Left 10/19/2015   Procedure: LEFT KNEE  SCOPE CHONDROPLASTY;  Surgeon: Toribio JULIANNA Chancy, MD;  Location: [facility];  Service: Orthopedics;  Laterality: Left;   KNEE ARTHROSCOPY WITH LATERAL RELEASE Left 09/18/2012   Procedure: KNEE ARTHROSCOPY WITH LATERAL RELEASE;  Surgeon: Toribio JULIANNA Chancy, MD;  Location: [facility];  Service: Orthopedics;  Laterality: Left;  WITH DEBRIDEMENT AND SHAVING(CHONDROPLASTY), Excision Medial [place]   NASAL SINUS SURGERY  08/05/2012   PARTIAL KNEE ARTHROPLASTY Left 08/15/2016   Procedure: PATELLA FEMORAL REPLACEMENT LEFT KNEE;  Surgeon: Toribio JULIANNA Chancy, MD;  Location: [facility];  Service: Orthopedics;  Laterality: Left;   RADICAL ORCHIECTOMY  02/07/2005   left; with left testicular implant    Family History family history includes Cancer in his father and mother; Cancer (age of onset: 43) in his sister; Colon cancer in his maternal uncle; Diabetes in his maternal uncle; Esophageal cancer in his maternal uncle; Heart attack in his maternal uncle and paternal uncle; Heart attack (age of onset: 70) in his father; Heart disease in his father; Hypertension in his father,  mother, and sister; Lung cancer in his father; Migraines in his mother and sister; Ovarian cancer in his mother; Skin cancer in his father; Stroke in his mother.  Social History Social History   Socioeconomic History   Marital status: Married    Spouse name: Not on file   Number of children: 2   Years of education: Not on file   Highest education level: Not on file  Occupational History   Occupation: truck driver  Tobacco Use   Smoking status: Never    Passive exposure: Never   Smokeless tobacco: Never   Tobacco comments:    never used tobacco  Vaping Use   Vaping status: Never Used  Substance and Sexual Activity   Alcohol use: Yes    Alcohol/week: 3.0 standard drinks of alcohol    Types: 3 Cans of beer per week    Comment: social   Drug use: No   Sexual activity: Yes  Other Topics Concern   Not on file  Social History Narrative   Married, 2 children, is a naval architect for [employer].   08/2022      Social Drivers of Health   Tobacco Use: Low Risk (07/21/2024)   Patient History    Smoking Tobacco Use: Never    Smokeless Tobacco Use: Never    Passive Exposure: Never  Financial Resource Strain: Not on file  Food Insecurity: Not on file  Transportation Needs: Not on file  Physical Activity: Not on file  Stress: Not on file  Social Connections: Not on file  Intimate Partner Violence: Not on file  Depression (PHQ2-9): Low Risk (08/20/2023)   Depression (PHQ2-9)    PHQ-2 Score: 0  Alcohol Screen: Not on file  Housing: Not on file  Utilities: Not on file  Health Literacy: Not on file    Laboratory Investigations Lab Results  Component Value Date   TSH <0.01 (L) 07/16/2024   TSH 0.01 (L) 06/02/2024   TSH 2.41 01/14/2024   FREET4 1.3 07/16/2024   FREET4 1.6 06/02/2024   FREET4 1.0 01/14/2024     Lab Results  Component Value Date   TSI 282 (H) 08/27/2023     No components found for: TRAB   Lab Results  Component Value Date   CHOL 115 08/20/2023   Lab  Results  Component Value Date   HDL 45 08/20/2023   Lab Results  Component Value Date   LDLCALC  58 08/20/2023   Lab Results  Component Value Date   TRIG 54 08/20/2023   Lab Results  Component Value Date   CHOLHDL 2.6 08/20/2023   Lab Results  Component Value Date   CREATININE 0.89 07/16/2024   Lab Results  Component Value Date   GFR 94.61 01/06/2023      Component Value Date/Time   NA 138 07/16/2024 0908   NA 138 07/07/2024 1054   K 4.1 07/16/2024 0908   CL 106 07/16/2024 0908   CO2 25 07/16/2024 0908   GLUCOSE 157 (H) 07/16/2024 0908   BUN 16 07/16/2024 0908   BUN 12 07/07/2024 1054   CREATININE 0.89 07/16/2024 0908   CALCIUM  9.0 07/16/2024 0908   PROT 6.7 07/16/2024 0908   PROT 6.8 08/20/2023 1107   ALBUMIN 4.1 08/20/2023 1107   AST 34 07/16/2024 0908   ALT 46 07/16/2024 0908   ALKPHOS 135 (H) 08/20/2023 1107   BILITOT 0.5 07/16/2024 0908   BILITOT 0.8 08/20/2023 1107   GFRNONAA >60 12/17/2022 1855   GFRAA 106 12/16/2019 1106      Latest Ref Rng & Units 07/16/2024    9:08 AM 07/07/2024   10:54 AM 06/02/2024    8:01 AM  BMP  Glucose 65 - 99 mg/dL 842  863  814   BUN 7 - 25 mg/dL 16  12  13    Creatinine 0.70 - 1.30 mg/dL 9.10  9.17  9.12   BUN/Creat Ratio 6 - 22 (calc) SEE NOTE:  15  SEE NOTE:   Sodium 135 - 146 mmol/L 138  138  137   Potassium 3.5 - 5.3 mmol/L 4.1  4.4  4.5   Chloride 98 - 110 mmol/L 106  105  105   CO2 20 - 32 mmol/L 25  19  24    Calcium  8.6 - 10.3 mg/dL 9.0  8.8  9.2        Component Value Date/Time   WBC 5.9 08/18/2023 1341   WBC 5.9 01/06/2023 0903   RBC 5.22 08/18/2023 1341   RBC 5.21 01/06/2023 0903   HGB 15.0 07/07/2024 1054   HGB 16.4 06/20/2014 1026   HGB 16.4 06/04/2007 0755   HCT 45.6 07/07/2024 1054   HCT 47.1 06/20/2014 1026   HCT 46.3 06/04/2007 0755   PLT 331 08/18/2023 1341   MCV 86 08/18/2023 1341   MCV 87 06/20/2014 1026   MCV 88.2 06/04/2007 0755   MCH 28.7 08/18/2023 1341   MCH 30.3 12/17/2022 1855    MCHC 33.4 08/18/2023 1341   MCHC 32.9 01/06/2023 0903   RDW 12.2 08/18/2023 1341   RDW 11.9 06/20/2014 1026   RDW 13.0 06/04/2007 0755   LYMPHSABS 1.7 08/18/2023 1341   LYMPHSABS 2.1 06/20/2014 1026   LYMPHSABS 1.4 06/04/2007 0755   MONOABS 0.6 01/06/2023 0903   MONOABS 0.4 06/04/2007 0755   EOSABS 0.1 08/18/2023 1341   EOSABS 0.2 06/20/2014 1026   BASOSABS 0.0 08/18/2023 1341   BASOSABS 0.1 06/20/2014 1026   BASOSABS 0.0 06/04/2007 0755      Parts of this note may have been dictated using voice recognition software. There may be variances in spelling and vocabulary which are unintentional. Not all errors are proofread. Please notify the dino if any discrepancies are noted or if the meaning of any statement is not clear.

## 2024-07-21 NOTE — Patient Instructions (Signed)
 Recommend the following: start methimazole  10 mg once a day for 1 week followed by 7.5 mg weekly. Clear with ophthalmologist about safety concerns for wprsening of Grave's eye disease before proceeding with radioactive iodine. Discussed about risks and side effects, patient wants RAI.

## 2024-07-26 ENCOUNTER — Other Ambulatory Visit: Payer: Self-pay | Admitting: "Endocrinology

## 2024-07-26 DIAGNOSIS — E0500 Thyrotoxicosis with diffuse goiter without thyrotoxic crisis or storm: Secondary | ICD-10-CM

## 2024-07-26 MED ORDER — PREDNISONE 10 MG PO TABS: 35.0000 mg | ORAL_TABLET | Freq: Every day | ORAL | 1 refills | Status: AC

## 2024-07-27 NOTE — Telephone Encounter (Signed)
Called pt was unable to lvm.

## 2024-08-09 ENCOUNTER — Telehealth: Payer: Self-pay

## 2024-08-09 ENCOUNTER — Ambulatory Visit: Admitting: Audiologist

## 2024-08-09 NOTE — Telephone Encounter (Signed)
 Pt is schedule for [place] Thyroid  scan 08/11/24. Pt needs PA .

## 2024-08-11 ENCOUNTER — Encounter (HOSPITAL_COMMUNITY): Payer: Self-pay

## 2024-08-11 ENCOUNTER — Encounter (HOSPITAL_COMMUNITY)

## 2024-08-11 ENCOUNTER — Encounter (HOSPITAL_COMMUNITY)
Admission: RE | Admit: 2024-08-11 | Discharge: 2024-08-11 | Disposition: A | Discharge status: Home / Self Care | Source: Ambulatory Visit | Attending: "Endocrinology | Admitting: "Endocrinology

## 2024-08-11 DIAGNOSIS — E0500 Thyrotoxicosis with diffuse goiter without thyrotoxic crisis or storm: Secondary | ICD-10-CM | POA: Insufficient documentation

## 2024-08-12 ENCOUNTER — Encounter (HOSPITAL_COMMUNITY)

## 2024-08-12 ENCOUNTER — Encounter (HOSPITAL_COMMUNITY): Payer: Self-pay

## 2024-08-19 ENCOUNTER — Encounter (HOSPITAL_COMMUNITY)
Admission: RE | Admit: 2024-08-19 | Discharge: 2024-08-19 | Disposition: A | Discharge status: Home / Self Care | Source: Ambulatory Visit | Attending: "Endocrinology | Admitting: "Endocrinology

## 2024-08-19 DIAGNOSIS — E0500 Thyrotoxicosis with diffuse goiter without thyrotoxic crisis or storm: Secondary | ICD-10-CM | POA: Diagnosis present

## 2024-08-19 MED ORDER — SODIUM IODIDE I-123 7.4 MBQ CAPS
450.0000 | ORAL_CAPSULE | Freq: Once | ORAL | Status: AC
  Administered 2024-08-19: 450 via ORAL

## 2024-08-20 ENCOUNTER — Encounter (HOSPITAL_COMMUNITY)
Admission: RE | Admit: 2024-08-20 | Discharge: 2024-08-20 | Disposition: A | Discharge status: Home / Self Care | Source: Ambulatory Visit | Attending: "Endocrinology | Admitting: "Endocrinology

## 2024-08-21 ENCOUNTER — Other Ambulatory Visit: Payer: Self-pay | Admitting: Cardiology

## 2024-08-21 DIAGNOSIS — I4891 Unspecified atrial fibrillation: Secondary | ICD-10-CM

## 2024-08-24 ENCOUNTER — Other Ambulatory Visit

## 2024-08-24 ENCOUNTER — Other Ambulatory Visit: Payer: Self-pay | Admitting: Medical

## 2024-08-24 DIAGNOSIS — R112 Nausea with vomiting, unspecified: Secondary | ICD-10-CM

## 2024-08-24 NOTE — Telephone Encounter (Signed)
 Patient is overdue for a visit. Please schedule. I will refill meds for 30 days

## 2024-08-25 ENCOUNTER — Encounter: Payer: Self-pay | Admitting: Student in an Organized Health Care Education/Training Program

## 2024-08-25 ENCOUNTER — Other Ambulatory Visit

## 2024-08-25 ENCOUNTER — Ambulatory Visit

## 2024-08-25 ENCOUNTER — Ambulatory Visit: Admitting: Student in an Organized Health Care Education/Training Program

## 2024-08-25 VITALS — BP 143/84 | HR 85 | Ht 69.0 in | Wt 231.0 lb

## 2024-08-25 DIAGNOSIS — I480 Paroxysmal atrial fibrillation: Secondary | ICD-10-CM

## 2024-08-25 MED ORDER — FAMOTIDINE 20 MG PO TABS: 20.0000 mg | ORAL_TABLET | Freq: Every day | ORAL | 1 refills | Status: AC

## 2024-08-25 MED ORDER — ROSUVASTATIN CALCIUM 10 MG PO TABS: ORAL_TABLET | ORAL | 0 refills | Status: AC

## 2024-08-25 NOTE — Progress Notes (Signed)
 " Cardiology Office Note   Date: 08/25/24 ID:  Scott Moss, DOB 1976/07/16, MRN [id number] PCP: Bulah Alm RAMAN, PA-C  [facility] Providers Cardiologist:  Madonna Large, DO Electrophysiologist:  Donnice DELENA Primus, MD   History of Present Illness Scott Moss is a 49 y.o. male with persistent AF/RVR, HTN, HLD, hyperthyroidism and aortic atherosclerosis who presents for AF management and associated stroke risk.   Discussed the use of AI scribe software for clinical note transcription with the patient, who gave verbal consent to proceed. History of Present Illness Scott Moss is a 49 year old male with atrial fibrillation and thyroid  issues who presents for evaluation of his atrial fibrillation and thyroid  management. He was referred by Dr. 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  He has a history of atrial fibrillation diagnosed around the same time as his thyroid  issues. He is currently on blood thinners and has been on them continuously since they were prescribed. He has not had any recent episodes of atrial fibrillation that he is aware of. He does not feel when he is in atrial fibrillation and cannot tell if his heart is racing. He has not undergone cardioversion as his heart rhythm returned to normal on its own previously.  He has been off thyroid  medication for two weeks in preparation for upcoming radioiodine therapy scheduled for Friday. His thyroid  levels have fluctuated, causing symptoms such as sweating, feeling jittery, and being short of breath. These symptoms have been difficult to distinguish from atrial fibrillation which had minimal associated sx. During periods when his thyroid  levels were stable, his AF did not recur.   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 There is concern about the risk of injury and bleeding  due to his work environment and the use of blood thinners.  He has no history of heart failure, diabetes, or stroke. He is on medication for high blood pressure. He has a family history of severe heart issues in his parents during their mid-thirties.  He experiences shortness of breath and excessive sweating without exertion.  ROS: none   Studies Reviewed  ECG review 01/14/24: NSR 64, PR 186, QRS 88, QT/c 402/414 10/24/23: AF/VR 79, QRS 90, QT/c 390/447 09/05/23: AF/VR 90, QRS 90, QT/c 352/430 06/06/17: ST 101, PR 154, QRS 90, QT/c 332/430 08/13/16: NSR 87, PR 148, QRS 92, QT/c 360/433 04/12/10: NSR 72, PR 156, QRS 88, QT/c 368/403  TTE  Result date: 09/24/23  1. Left ventricular ejection fraction, by estimation, is 55 to 60%. The  left ventricle has normal function. The left ventricle has no regional  wall motion abnormalities. There is mild left ventricular hypertrophy.  Left ventricular diastolic parameters  are indeterminate.   2. Right ventricular systolic function is normal. The right ventricular  size is normal. Tricuspid regurgitation signal is inadequate for assessing  PA pressure.   3. Left atrial size was mildly dilated.   4. The mitral valve is normal in structure. Trivial mitral valve  regurgitation. No evidence of mitral stenosis.   5. The aortic valve is grossly normal. Aortic valve regurgitation is not  visualized. No aortic stenosis is present.   6. The inferior vena cava is normal in size with greater than 50%  respiratory variability, suggesting right atrial pressure of 3 mmHg.   ETT Result date: 02/08/20 Exercise treadmill stress test performed using Bruce protocol.  Patient reached 10.1 METS, and 91% of age predicted maximum heart rate.  Exercise capacity was good.  No chest pain reported.  Normal heart rate and hemodynamic response. Stress EKG revealed noischemic changes. Low risk study.  Risk Assessment/Calculations  CHA2DS2-VASc Score = 2   This  indicates a 2.2% annual risk of stroke. The patient's score is based upon: CHF History: 0 HTN History: 1 Diabetes History: 0 Stroke History: 0 Vascular Disease History: 1 Age Score: 0 Gender Score: 0  Physical Exam VS:  BP (!) 143/84 (BP Location: Right Arm, Patient Position: Sitting, Cuff Size: Large)   Pulse 85   Ht 5' 9 (1.753 m)   Wt 231 lb (104.8 kg)   SpO2 96%   BMI 34.11 kg/m   Wt Readings from Last 3 Encounters:  08/25/24 231 lb (104.8 kg)  07/21/24 238 lb (108 kg)  07/16/24 232 lb (105.2 kg)    GEN: Well nourished, well developed in no acute distress CARDIAC: RRR, no murmurs, rubs, gallops RESPIRATORY:  Clear to auscultation without rales, wheezing or rhonchi  EXTREMITIES:  No edema; No deformity   ASSESSMENT AND PLAN Scott Moss is a 49 y.o. male with persistent AF/RVR, HTN, HLD, hyperthyroidism and aortic atherosclerosis who presents for AF management and associated stroke risk.  Assessment & Plan Atrial fibrillation Episodes improved, no recent episodes. Low stroke risk. Hyperthyroidism is likely contributing/trigger. Discussed ablation and Watchman procedure risks and benefits.  He had an episode of AF with controlled VR in both January and March.  He initially was dealing with hyperthyroidism which was managed with methimazole  and then had hypothyroidism secondary to treatment.  Both episodes spontaneously resolved and did not require cardioversion.  He does not have much rhythm awareness.  We discussed different options for management of atrial fibrillation.  At this time I do not think that proceeding with ablation and/or Watchman should be pursued until after we see if he has recurrent AF episodes with treatment of his thyroid .  If he has recurrent AF despite radioiodine therapy then we could consider ablation alone with continued observation for recurrence.  In that scenario I would plan to continue OAC for 1 year and reassess for any recurrence at that time with  discontinuation of OAC.  If he wanted to immediately stop OAC after the standard 54-month period post ablation then we could consider a concomitant procedure.  With his young age I would pursue concomitant ablation with Watchman and/or ablation alone if he has recurrent AF despite thyroid  management. D/c OAC for now unless recurrent AF despite thyroid  management. Sent a monitor to assess for arrhythmia recurrence. Will repeat monitor in 6 months to see if we need to consider ablation +/- Watchman implant at that time. He understands there is some risk (~2.2%/yr) of stroke while not on OAC. Also discussed ILR implant as a way to monitor for AF recurrence if we don't see any on upcoming 14 day Zio.  - Discontinued blood thinners for now  - Monitor for AFib episodes post-thyroid  treatment. - Consider ablation and/or Watchman procedure if AFib persists after thyroid  treatment.  Hyperthyroidism Managed with scheduled radioiodine therapy. Thyroid  issues may contribute to AFib. - Proceed with radioiodine therapy as scheduled. - Monitor thyroid  function post-treatment to assess impact on AFib.  Discussed treatment options today for AF including antiarrhythmic drug therapy and ablation. Discussed risks, recovery and likelihood of success with each treatment strategy. Risk, benefits, and alternatives to EP study and ablation for afib were discussed. These  risks include but are not limited to stroke, bleeding, vascular damage, tamponade, perforation, damage to the esophagus, lungs, phrenic nerve and other structures, worsening renal function, coronary vasospasm and death.  Discussed potential need for repeat ablation procedures and antiarrhythmic drugs after an initial ablation. The patient understands these risks. I recommended that we wait to consider any invasive management until his thyroid  is stable post radioiodine therapy.  I have seen Sharolyn SHAUNNA Rosella in the office today who is being considered for a Watchman  left atrial appendage closure device. I believe they will benefit from this procedure given their history of atrial fibrillation, CHA2DS2-VASc score of 2 and unadjusted ischemic stroke rate of 2.2% per year. Unfortunately, the patient is not felt to be a long term anticoagulation candidate secondary to working with heavy machinery. The patient's chart has been reviewed and I feel that they would be a candidate for short term oral anticoagulation after Watchman implant.   It is my belief that after undergoing a LAA closure procedure, Sharolyn SHAUNNA Rosella will not need long term anticoagulation which eliminates anticoagulation side effects and major bleeding risk.   Procedural risks for the Watchman implant have been reviewed with the patient including a 0.5% risk of stroke, <1% risk of perforation and <1% risk of device embolization. Other risks include bleeding, vascular damage, tamponade, worsening renal function, and death. The patient understands these risks.  The published clinical data on the safety and effectiveness of WATCHMAN include but are not limited to the following: - Holmes DR, Jess BEARD, Sick P et al. for the PROTECT AF Investigators. Percutaneous closure of the left atrial appendage versus warfarin therapy for prevention of stroke in patients with atrial fibrillation: a randomised non-inferiority trial. Lancet 2009; 374: 534-42. GLENWOOD Jess BEARD, Doshi SK, Jonita VEAR Satchel D et al. on behalf of the PROTECT AF Investigators. Percutaneous Left Atrial Appendage Closure for Stroke Prophylaxis in Patients With Atrial Fibrillation 2.3-Year Follow-up of the PROTECT AF (Watchman Left Atrial Appendage System for Embolic Protection in Patients With Atrial Fibrillation) Trial. Circulation 2013; 127:720-729. - Alli O, Doshi S,  Kar S, Reddy VY, Sievert H et al. Quality of Life Assessment in the Randomized PROTECT AF (Percutaneous Closure of the Left Atrial Appendage Versus Warfarin Therapy for Prevention of Stroke  in Patients With Atrial Fibrillation) Trial of Patients at Risk for Stroke With Nonvalvular Atrial Fibrillation. J Am Coll Cardiol 2013; 61:1790-8. GLENWOOD Satchel DR, Archer RAMAN, Price M, Whisenant B, Sievert H, Doshi S, Huber K, Reddy V. Prospective randomized evaluation of the Watchman left atrial appendage Device in patients with atrial fibrillation versus long-term warfarin therapy; the PREVAIL trial. Journal of the [employer] of Cardiology, Vol. 4, No. 1, 2014, 1-11. - Kar S, Doshi SK, Sadhu A, Horton R, Osorio J et al. Primary outcome evaluation of a next-generation left atrial appendage closure device: results from the PINNACLE FLX trial. Circulation 2021;143(18)1754-1762.   After today's visit with the patient which was dedicated solely for shared decision making visit regarding LAA closure device, I recommended we wait to consider whether invasive options are indicated after his thyroid  is management post radioiodine therapy.  HAS-BLED score 1 Hypertension No  Abnormal renal and liver function (Dialysis, transplant, Cr >2.26 mg/dL /Cirrhosis or Bilirubin >2x Normal or AST/ALT/AP >3x Normal) No  Stroke No  Bleeding No  Labile INR (Unstable/high INR) No  Elderly (>65) No  Drugs or alcohol (>= 8 drinks/week, anti-plt or NSAID) Yes   CHA2DS2-VASc Score = 2  The  patient's score is based upon: CHF History: 0 HTN History: 1 Diabetes History: 0 Stroke History: 0 Vascular Disease History: 1 Age Score: 0 Gender Score: 0  NYHA class: I  Dispo: Send monitor to assess for AF recurrence, RTC 6 months and reassess for AF recurrence to determine whether ablation +/- Watchman is needed  Signed, Donnice DELENA Primus, MD  "

## 2024-08-25 NOTE — Progress Notes (Unsigned)
 Enrolled for [employer] to mail a ZIO XT long term holter monitor to the patients address on file.

## 2024-08-25 NOTE — Patient Instructions (Signed)
 Medication Instructions:  Your physician has recommended you make the following change in your medication:   ** Stop Eliquis   *If you need a refill on your cardiac medications before your next appointment, please call your pharmacy*  Lab Work: None ordered.  If you have labs (blood work) drawn today and your tests are completely normal, you will receive your results only by: MyChart Message (if you have MyChart) OR A paper copy in the mail If you have any lab test that is abnormal or we need to change your treatment, we will call you to review the results.  Testing/Procedures: Scott Moss- Long Term Monitor Instructions  Your physician has requested you wear a ZIO patch monitor for 14 days.  This is a single patch monitor. [employer] supplies one patch monitor per enrollment. Additional stickers are not available. Please do not apply patch if you will be having a Nuclear Stress Test,  Echocardiogram, Cardiac CT, MRI, or Chest Xray during the period you would be wearing the  monitor. The patch cannot be worn during these tests. You cannot remove and re-apply the  ZIO XT patch monitor.  Your ZIO patch monitor will be mailed 3 day USPS to your address on file. It may take 3-5 days  to receive your monitor after you have been enrolled.  Once you have received your monitor, please review the enclosed instructions. Your monitor  has already been registered assigning a specific monitor serial # to you.  Billing and Patient Assistance Program Information  We have supplied [employer] with any of your insurance information on file for billing purposes. [employer] offers a sliding scale Patient Assistance Program for patients that do not have  insurance, or whose insurance does not completely cover the cost of the ZIO monitor.  You must apply for the Patient Assistance Program to qualify for this discounted rate.  To apply, please call [employer] at [phone], select option 4, select option 2, ask to apply  for  Patient Assistance Program. Scott Moss will ask your household income, and how many people  are in your household. They will quote your out-of-pocket cost based on that information.  [employer] will also be able to set up a 34-month, interest-free payment plan if needed.  Applying the monitor   Shave hair from upper left chest.  Hold abrader disc by orange tab. Rub abrader in 40 strokes over the upper left chest as  indicated in your monitor instructions.  Clean area with 4 enclosed alcohol pads. Let dry.  Apply patch as indicated in monitor instructions. Patch will be placed under collarbone on left  side of chest with arrow pointing upward.  Rub patch adhesive wings for 2 minutes. Remove white label marked 1. Remove the white  label marked 2. Rub patch adhesive wings for 2 additional minutes.  While looking in a mirror, press and release button in center of patch. A small green light will  flash 3-4 times. This will be your only indicator that the monitor has been turned on.  Do not shower for the first 24 hours. You may shower after the first 24 hours.  Press the button if you feel a symptom. You will hear a small click. Record Date, Time and  Symptom in the Patient Logbook.  When you are ready to remove the patch, follow instructions on the last 2 pages of Patient  Logbook. Stick patch monitor onto the last page of Patient Logbook.  Place Patient Logbook in the blue and white box. Use  locking tab on box and tape box closed  securely. The blue and white box has prepaid postage on it. Please place it in the mailbox as  soon as possible. Your physician should have your test results approximately 7 days after the  monitor has been mailed back to [facility].  Call [facility] Customer Care at [phone] if you have questions regarding  your ZIO XT patch monitor. Call them immediately if you see an orange light blinking on your  monitor.  If your monitor falls off in less than  4 days, contact our Monitor department at [phone].  If your monitor becomes loose or falls off after 4 days call [employer] at [phone] for  suggestions on securing your monitor   Follow-Up: At [facility], you and your health needs are our priority.  As part of our continuing mission to provide you with exceptional heart care, our providers are all part of one team.  This team includes your primary Cardiologist (physician) and Advanced Practice Providers or APPs (Physician Assistants and Nurse Practitioners) who all work together to provide you with the care you need, when you need it.  Your next appointment:   6 months with Dr Almetta  We recommend signing up for the patient portal called MyChart.  Sign up information is provided on this After Visit Summary.  MyChart is used to connect with patients for Virtual Visits (Telemedicine).  Patients are able to view lab/test results, encounter notes, upcoming appointments, etc.  Non-urgent messages can be sent to your provider as well.   To learn more about what you can do with MyChart, go to [email or web address].

## 2024-08-25 NOTE — Addendum Note (Signed)
 Addended by: VICCI HUSBAND A on: 08/25/2024 03:46 PM   Modules accepted: Orders

## 2024-08-26 NOTE — Written Directive (Addendum)
" °  I-131 WHOLE THYROID  THERAPY (NON-CANCER)    RADIOPHARMACEUTICAL:   Iodine-131 Capsule    PRESCRIBED DOSE FOR ADMINISTRATION: 16 mCi   ROUTE OFADMINISTRATION: PO   DIAGNOSIS:  : Graves disease    REFERRING PHYSICIAN: Motwani, Komal, MD    TSH:   0.01 Lab Results  Component Value Date   TSH <0.01 (L) 07/16/2024   TSH 0.01 (L) 06/02/2024   TSH 2.41 01/14/2024     PRIOR I-131 THERAPY (Date and Dose): none  PRIOR RADIOLOGY EXAMS (Results and Date): NM THYROID  MULT UPTAKE W/IMAGING Result Date: 08/20/2024 EXAM: THYROID  UPTAKE AND SCAN 08/20/2024 09:30:00 AM TECHNIQUE: Radioiodine was administered by mouth. Thyroid  uptake measurements were performed at 4-6 and 24 hours. Planar images of the thyroid  were obtained. RADIOPHARMACEUTICAL: 450 microcuries I-123 COMPARISON: Thyroid  ultrasound 10/24/2023. CLINICAL HISTORY: Graves disease. Thyroid  medication held for past week, no Iodinated contrast in past 6 weeks, NPO for 4 hours prior to dosing. TSH < 0.01. FINDINGS: Thyroid  uptake at 4-6 hours measured 38.6%. Normal range at 4-6 hours is 6-18%. Thyroid  uptake at 24 hours measured 58.8%. Normal range at 24 hours is 10-35%. There is a small 1 cm nodule within the right thyroid  gland. Otherwise, uptake within the gland is uniform. IMPRESSION: 1. Imaging and iodine uptake consistent with Graves disease. 2. No suspicious nodularity on comparison thyroid  ultrasound 10/24/23. Electronically signed by: Norleen Boxer MD 08/20/2024 11:11 AM EST RP Workstation: [id number]   US  THYROID  Result Date: 11/07/2023 CLINICAL DATA:  Dysphagia EXAM: THYROID  ULTRASOUND TECHNIQUE: Ultrasound examination of the thyroid  gland and adjacent soft tissues was performed. COMPARISON:  None available. FINDINGS: Parenchymal Echotexture: Mildly heterogenous Isthmus: 0.6 cm Right lobe: 5.4 x 2.1 x 2.1 cm Left lobe: 5.2 x 2.5 x 1.9 cm _________________________________________________________ Estimated total number of nodules >/=  1 cm: 0 Number of spongiform nodules >/=  2 cm not described below (TR1): 0 Number of mixed cystic and solid nodules >/= 1.5 cm not described below (TR2): 0 _________________________________________________________ No discrete nodules are seen within the thyroid  gland. IMPRESSION: Mild diffuse heterogeneity of the thyroid  parenchyma without discrete nodule. The above is in keeping with the ACR TI-RADS recommendations - Jahki Witham Am Coll Radiol 2017;14:587-595. Electronically Signed   By: Aliene Lloyd M.D.   On: 11/07/2023 08:05      ADDITIONAL PHYSICIAN COMMENTS/NOTES Graves disease..    AUTHORIZED USER SIGNATURE & TIME STAMP: Norleen GORMAN Boxer, MD   08/26/24    8:47 AM     "

## 2024-08-27 ENCOUNTER — Encounter (HOSPITAL_COMMUNITY)
Admission: RE | Admit: 2024-08-27 | Discharge: 2024-08-27 | Disposition: A | Source: Ambulatory Visit | Attending: "Endocrinology

## 2024-08-27 ENCOUNTER — Encounter (HOSPITAL_COMMUNITY)

## 2024-08-27 DIAGNOSIS — E0500 Thyrotoxicosis with diffuse goiter without thyrotoxic crisis or storm: Secondary | ICD-10-CM | POA: Insufficient documentation

## 2024-08-27 MED ORDER — SODIUM IODIDE I 131 CAPSULE
15.4000 | Freq: Once | INTRAVENOUS | Status: AC
  Administered 2024-08-27: 15.4 via ORAL

## 2024-08-30 LAB — T4, FREE: Free T4: 1.9 ng/dL — ABNORMAL HIGH (ref 0.8–1.8)

## 2024-08-30 LAB — T3, FREE: T3, Free: 8 pg/mL — ABNORMAL HIGH (ref 2.3–4.2)

## 2024-08-30 LAB — TRAB (TSH RECEPTOR BINDING ANTIBODY): TRAB: 4.13 [IU]/L — ABNORMAL HIGH

## 2024-08-30 LAB — TSH: TSH: <0.01 m[IU]/L — ABNORMAL LOW (ref 0.40–4.50)

## 2024-08-30 LAB — THYROID STIMULATING IMMUNOGLOBULIN: TSI: 169 % baseline — ABNORMAL HIGH

## 2024-08-31 ENCOUNTER — Telehealth: Admitting: "Endocrinology

## 2024-08-31 ENCOUNTER — Encounter: Payer: Self-pay | Admitting: "Endocrinology

## 2024-08-31 VITALS — Ht 69.0 in | Wt 231.0 lb

## 2024-08-31 DIAGNOSIS — E0500 Thyrotoxicosis with diffuse goiter without thyrotoxic crisis or storm: Secondary | ICD-10-CM

## 2024-08-31 DIAGNOSIS — Z923 Personal history of irradiation: Secondary | ICD-10-CM

## 2024-08-31 NOTE — Progress Notes (Signed)
 "  The patient reports they are currently: Scott Moss. I spent 9 minutes on the video with the patient on the date of service. I spent an additional 3 minutes on pre- and post-visit activities on the date of service.   The patient was physically located in [place]  or a state in which I am permitted to provide care. The patient and/or parent/guardian understood that s/he may incur co-pays and cost sharing, and agreed to the telemedicine visit. The visit was reasonable and appropriate under the circumstances given the patient's presentation at the time.  The patient and/or parent/guardian understands the potential risks and limitations of this mode of treatment (including, but not limited to, the absence of in-person examination) and has agreed to be treated using telemedicine. The patient's/patient's family's questions regarding telemedicine have been answered.   The patient and/or parent/guardian will contact their provider's office for worsening conditions, and seek emergency medical treatment and/or call 911 if the patient deems either necessary.     Outpatient Endocrinology Note Obadiah Birmingham, MD  08/31/24   WILLIS HOLQUIN 17-Sep-1975 [id number]  Referring Provider: Bulah Alm RAMAN, PA-C Primary Care Provider: Bulah Alm RAMAN, PA-C Subjective  No chief complaint on file.   Assessment & Plan  Diagnoses and all orders for this visit:  Graves disease  History of radioactive iodine thyroid  ablation   Sharolyn SHAUNNA Rosella has stopped methimazole  10 mg tid on 12/10/23 when TSH shifted from <0.01 to 62.39. On methimazole  5 mg once a day.  Patient is currently biochemically hyperthyroid. S/p RAI ablation on 08/27/24. On Prednisone  35 mg every day for 1 mo post RAI ablation. Discussed the etiology for hyperthyroidism. Educated on thyroid  axis.  08/31/24 Recommend the following: start methimazole  10 mg twice a day, resume day Day 4 for RAI iodine.  Repeat labs sooner if symptoms of hyper or  hypothyroidism develop.  -complications of untreated hyperthyroidism include atrial fibrillation, heart failure and osteoporosis -side effects of Methimazole  include but not limited to allergic reaction, rash, bone marrow suppression, liver dysfunction and teratogenic potential -compliance and follow up needs    Left eye proptosis noted since 06/2023 Saw ophthalmologist, finished tepezza  treatment in 07/2024  History of testicular cancer 11/13/23 reviewed thyroid  images and report: no nodules reported   AST/ALT previously elevated; 07/16/24: now normalized  No new medication/dose change Continue monitoring  I have reviewed current medications, nurse's notes, allergies, vital signs, past medical and surgical history, family medical history, and social history for this encounter. Counseled patient on symptoms, examination findings, lab findings, imaging results, treatment decisions and monitoring and prognosis. The patient understood the recommendations and agrees with the treatment plan. All questions regarding treatment plan were fully answered.   Return in about 1 month (around 10/01/2024) for tele-visit + labs before next visit.  Obadiah Birmingham, MD  08/31/24   I have reviewed current medications, nurse's notes, allergies, vital signs, past medical and surgical history, family medical history, and social history for this encounter. Counseled patient on symptoms, examination findings, lab findings, imaging results, treatment decisions and monitoring and prognosis. The patient understood the recommendations and agrees with the treatment plan. All questions regarding treatment plan were fully answered.   History of Present Illness Scott Moss is a 49 y.o. year old male who presents to our clinic with hyperthyroidism diagnosed in 2025.    Left eye became bulgy around 06/2023   Symptoms suggestive of HYPOTHYROIDISM:  fatigue No              weight gain No              cold  intolerance  No              constipation  No  Symptoms suggestive of HYPERTHYROIDISM:  weight loss  Yes heat intolerance Yes hyperdefecation  No palpitations  Yes  Compressive symptoms:  dysphagia  No, resolved  dysphonia  Yes positional dyspnea (especially with simultaneous arms elevation)  No  Smokes  No On biotin  No Personal history of head/neck surgery/irradiation  Yes, spinal surgery  Adverse Drug Effects from Methimazole  (MMI): rash No fever No throat pain No arthritis No mouth ulcers No jaundice No loss of appetite No lymphadenopathy No  Grave's Ophthalmopathy Clinical Activity Score: 0/9, but evident Left eye proptosis, saw ophthalmology. Finished Tepezza  in 07/2024  Physical Exam  Ht 5' 9 (1.753 m)   Wt 231 lb (104.8 kg)   BMI 34.11 kg/m  Constitutional: well developed, well nourished Head: normocephalic, atraumatic, + L eye exophthalmos Eyes: sclera anicteric, no redness Neck: no thyromegaly, no thyroid  tenderness; no nodules palpated Lungs: normal respiratory effort Neurology: alert and oriented, + L fine hand tremor Skin: dry, no appreciable rashes Musculoskeletal: no appreciable defects Psychiatric: normal mood and affect  Allergies Allergies  Allergen Reactions   Benicar  [Olmesartan ]     Rash, allergic reaction    Current Medications Patient's Medications  New Prescriptions   No medications on file  Previous Medications   ACETAMINOPHEN  (TYLENOL ) 500 MG TABLET    Take 500 mg by mouth every 6 (six) hours as needed for mild pain.   DILTIAZEM  (CARDIZEM  CD) 180 MG 24 HR CAPSULE    TAKE 1 CAPSULE(180 MG) BY MOUTH DAILY   FAMOTIDINE  (PEPCID ) 20 MG TABLET    Take 1 tablet (20 mg total) by mouth daily.   FEXOFENADINE (ALLEGRA) 180 MG TABLET    Take 180 mg by mouth daily.   FUROSEMIDE  (LASIX ) 20 MG TABLET    TAKE 1 TABLET(20 MG) BY MOUTH IN THE MORNING   METHIMAZOLE  (TAPAZOLE ) 5 MG TABLET    Take 1 tablet (5 mg total) by mouth daily.   PREDNISONE   (DELTASONE ) 10 MG TABLET    Take 3.5 tablets (35 mg total) by mouth daily with breakfast. Start day after receiving radioactive iodine   ROSUVASTATIN  (CRESTOR ) 10 MG TABLET    TAKE 1 TABLET(10 MG) BY MOUTH DAILY   VALSARTAN  (DIOVAN ) 160 MG TABLET    TAKE 1 TABLET(160 MG) BY MOUTH AT BEDTIME  Modified Medications   No medications on file  Discontinued Medications   No medications on file    Past Medical History Past Medical History:  Diagnosis Date   Allergy    Arthritis    Atypical chest pain 07/06/2015   Non-obstructive CAD on [facility] 04/2010.   Chondromalacia of left knee 09/2012   Complication of anesthesia    Family history of cancer    multiple family members, multiple types.   Sees Dr. Timmy   Family history of cancer    Family history of premature CAD    GERD (gastroesophageal reflux disease)    H/O cardiovascular stress test 07/14/2015   myocardial perfusion scan, normal, EF 45-54% mildy reduced LV function, Dr. Raford   H/O echocardiogram 07/18/2015   LV mild hypertrophy, 55-60%EF, Dr. Raford   High cholesterol    Hypertension    Obesity    Overweight  PONV (postoperative nausea and vomiting)    Testicular cancer (HCC) 02/2005   Dr. Timmy    Past Surgical History Past Surgical History:  Procedure Laterality Date   ANTERIOR CERVICAL DECOMP/DISCECTOMY FUSION  11/11/2008   C5-6   APPENDECTOMY     CARDIAC CATHETERIZATION  04/13/2010   no disease   CERVICAL SPINE SURGERY  08/05/2013   Dr. Victory Boers   COLONOSCOPY     prior, no problems per patient, year unknown, possibly in early 30s.   HIP ARTHROPLASTY Left 03/2020   HIP ARTHROPLASTY Right 04/2020   KNEE ARTHROSCOPY Right    KNEE ARTHROSCOPY Left 10/19/2015   Procedure: LEFT KNEE SCOPE CHONDROPLASTY;  Surgeon: Toribio JULIANNA Chancy, MD;  Location: [facility];  Service: Orthopedics;  Laterality: Left;   KNEE ARTHROSCOPY WITH LATERAL RELEASE Left 09/18/2012   Procedure: KNEE ARTHROSCOPY WITH  LATERAL RELEASE;  Surgeon: Toribio JULIANNA Chancy, MD;  Location: [facility];  Service: Orthopedics;  Laterality: Left;  WITH DEBRIDEMENT AND SHAVING(CHONDROPLASTY), Excision Medial [place]   NASAL SINUS SURGERY  08/05/2012   PARTIAL KNEE ARTHROPLASTY Left 08/15/2016   Procedure: PATELLA FEMORAL REPLACEMENT LEFT KNEE;  Surgeon: Toribio JULIANNA Chancy, MD;  Location: [facility];  Service: Orthopedics;  Laterality: Left;   RADICAL ORCHIECTOMY  02/07/2005   left; with left testicular implant    Family History family history includes Cancer in his father and mother; Cancer (age of onset: 78) in his sister; Colon cancer in his maternal uncle; Diabetes in his maternal uncle; Esophageal cancer in his maternal uncle; Heart attack in his maternal uncle and paternal uncle; Heart attack (age of onset: 33) in his father; Heart disease in his father; Hypertension in his father, mother, and sister; Lung cancer in his father; Migraines in his mother and sister; Ovarian cancer in his mother; Skin cancer in his father; Stroke in his mother.  Social History Social History   Socioeconomic History   Marital status: Married    Spouse name: Not on file   Number of children: 2   Years of education: Not on file   Highest education level: Not on file  Occupational History   Occupation: truck driver  Tobacco Use   Smoking status: Never    Passive exposure: Never   Smokeless tobacco: Never   Tobacco comments:    never used tobacco  Vaping Use   Vaping status: Never Used  Substance and Sexual Activity   Alcohol use: Yes    Alcohol/week: 3.0 standard drinks of alcohol    Types: 3 Cans of beer per week    Comment: social   Drug use: No   Sexual activity: Yes  Other Topics Concern   Not on file  Social History Narrative   Married, 2 children, is a naval architect for [employer].   08/2022      Social Drivers of Health   Tobacco Use: Low Risk (08/31/2024)   Patient History    Smoking Tobacco  Use: Never    Smokeless Tobacco Use: Never    Passive Exposure: Never  Financial Resource Strain: Not on file  Food Insecurity: Not on file  Transportation Needs: Not on file  Physical Activity: Not on file  Stress: Not on file  Social Connections: Not on file  Intimate Partner Violence: Not on file  Depression (PHQ2-9): Low Risk (08/20/2023)   Depression (PHQ2-9)    PHQ-2 Score: 0  Alcohol Screen: Not on file  Housing: Not on file  Utilities:  Not on file  Health Literacy: Not on file    Laboratory Investigations Lab Results  Component Value Date   TSH <0.01 (L) 08/25/2024   TSH <0.01 (L) 07/16/2024   TSH 0.01 (L) 06/02/2024   FREET4 1.9 (H) 08/25/2024   FREET4 1.3 07/16/2024   FREET4 1.6 06/02/2024     Lab Results  Component Value Date   TSI 169 (H) 08/25/2024     No components found for: TRAB   Lab Results  Component Value Date   CHOL 115 08/20/2023   Lab Results  Component Value Date   HDL 45 08/20/2023   Lab Results  Component Value Date   LDLCALC 58 08/20/2023   Lab Results  Component Value Date   TRIG 54 08/20/2023   Lab Results  Component Value Date   CHOLHDL 2.6 08/20/2023   Lab Results  Component Value Date   CREATININE 0.89 07/16/2024   Lab Results  Component Value Date   GFR 94.61 01/06/2023      Component Value Date/Time   NA 138 07/16/2024 0908   NA 138 07/07/2024 1054   K 4.1 07/16/2024 0908   CL 106 07/16/2024 0908   CO2 25 07/16/2024 0908   GLUCOSE 157 (H) 07/16/2024 0908   BUN 16 07/16/2024 0908   BUN 12 07/07/2024 1054   CREATININE 0.89 07/16/2024 0908   CALCIUM  9.0 07/16/2024 0908   PROT 6.7 07/16/2024 0908   PROT 6.8 08/20/2023 1107   ALBUMIN 4.1 08/20/2023 1107   AST 34 07/16/2024 0908   ALT 46 07/16/2024 0908   ALKPHOS 135 (H) 08/20/2023 1107   BILITOT 0.5 07/16/2024 0908   BILITOT 0.8 08/20/2023 1107   GFRNONAA >60 12/17/2022 1855   GFRAA 106 12/16/2019 1106      Latest Ref Rng & Units 07/16/2024    9:08 AM  07/07/2024   10:54 AM 06/02/2024    8:01 AM  BMP  Glucose 65 - 99 mg/dL 842  863  814   BUN 7 - 25 mg/dL 16  12  13    Creatinine 0.70 - 1.30 mg/dL 9.10  9.17  9.12   BUN/Creat Ratio 6 - 22 (calc) SEE NOTE:  15  SEE NOTE:   Sodium 135 - 146 mmol/L 138  138  137   Potassium 3.5 - 5.3 mmol/L 4.1  4.4  4.5   Chloride 98 - 110 mmol/L 106  105  105   CO2 20 - 32 mmol/L 25  19  24    Calcium  8.6 - 10.3 mg/dL 9.0  8.8  9.2        Component Value Date/Time   WBC 5.9 08/18/2023 1341   WBC 5.9 01/06/2023 0903   RBC 5.22 08/18/2023 1341   RBC 5.21 01/06/2023 0903   HGB 15.0 07/07/2024 1054   HGB 16.4 06/20/2014 1026   HGB 16.4 06/04/2007 0755   HCT 45.6 07/07/2024 1054   HCT 47.1 06/20/2014 1026   HCT 46.3 06/04/2007 0755   PLT 331 08/18/2023 1341   MCV 86 08/18/2023 1341   MCV 87 06/20/2014 1026   MCV 88.2 06/04/2007 0755   MCH 28.7 08/18/2023 1341   MCH 30.3 12/17/2022 1855   MCHC 33.4 08/18/2023 1341   MCHC 32.9 01/06/2023 0903   RDW 12.2 08/18/2023 1341   RDW 11.9 06/20/2014 1026   RDW 13.0 06/04/2007 0755   LYMPHSABS 1.7 08/18/2023 1341   LYMPHSABS 2.1 06/20/2014 1026   LYMPHSABS 1.4 06/04/2007 0755   MONOABS 0.6 01/06/2023 0903  MONOABS 0.4 06/04/2007 0755   EOSABS 0.1 08/18/2023 1341   EOSABS 0.2 06/20/2014 1026   BASOSABS 0.0 08/18/2023 1341   BASOSABS 0.1 06/20/2014 1026   BASOSABS 0.0 06/04/2007 0755      Parts of this note may have been dictated using voice recognition software. There may be variances in spelling and vocabulary which are unintentional. Not all errors are proofread. Please notify the dino if any discrepancies are noted or if the meaning of any statement is not clear.    "

## 2024-09-10 ENCOUNTER — Other Ambulatory Visit: Payer: Self-pay

## 2024-10-04 ENCOUNTER — Other Ambulatory Visit

## 2024-10-07 ENCOUNTER — Ambulatory Visit: Admitting: "Endocrinology
